# Patient Record
Sex: Female | Born: 1960 | Race: Black or African American | Hispanic: No | Marital: Single | Smoking: Former smoker
Health system: Southern US, Community
[De-identification: ages and names within clinical notes are randomized; demographics above are authoritative.]

## PROBLEM LIST (undated history)

## (undated) DIAGNOSIS — D649 Anemia, unspecified: Secondary | ICD-10-CM

## (undated) DIAGNOSIS — F32A Depression, unspecified: Secondary | ICD-10-CM

## (undated) DIAGNOSIS — I10 Essential (primary) hypertension: Secondary | ICD-10-CM

## (undated) DIAGNOSIS — F419 Anxiety disorder, unspecified: Secondary | ICD-10-CM

## (undated) DIAGNOSIS — E785 Hyperlipidemia, unspecified: Secondary | ICD-10-CM

## (undated) DIAGNOSIS — M1990 Unspecified osteoarthritis, unspecified site: Secondary | ICD-10-CM

## (undated) DIAGNOSIS — F329 Major depressive disorder, single episode, unspecified: Secondary | ICD-10-CM

## (undated) DIAGNOSIS — G4730 Sleep apnea, unspecified: Secondary | ICD-10-CM

## (undated) DIAGNOSIS — K219 Gastro-esophageal reflux disease without esophagitis: Secondary | ICD-10-CM

## (undated) HISTORY — DX: Anxiety disorder, unspecified: F41.9

## (undated) HISTORY — DX: Anemia, unspecified: D64.9

## (undated) HISTORY — DX: Major depressive disorder, single episode, unspecified: F32.9

## (undated) HISTORY — PX: OTHER SURGICAL HISTORY: SHX169

## (undated) HISTORY — DX: Unspecified osteoarthritis, unspecified site: M19.90

## (undated) HISTORY — PX: CARPAL TUNNEL RELEASE: SHX101

## (undated) HISTORY — DX: Depression, unspecified: F32.A

## (undated) HISTORY — DX: Gastro-esophageal reflux disease without esophagitis: K21.9

## (undated) HISTORY — DX: Hyperlipidemia, unspecified: E78.5

## (undated) HISTORY — DX: Essential (primary) hypertension: I10

---

## 1988-06-29 HISTORY — PX: OTHER SURGICAL HISTORY: SHX169

## 2004-03-04 ENCOUNTER — Ambulatory Visit (HOSPITAL_COMMUNITY): Admission: RE | Admit: 2004-03-04 | Discharge: 2004-03-04 | Payer: Self-pay | Admitting: Orthopaedic Surgery

## 2009-05-09 ENCOUNTER — Emergency Department (HOSPITAL_COMMUNITY): Admission: EM | Admit: 2009-05-09 | Discharge: 2009-05-09 | Payer: Self-pay | Admitting: Emergency Medicine

## 2009-08-03 ENCOUNTER — Encounter: Payer: Self-pay | Admitting: Family Medicine

## 2009-08-03 DIAGNOSIS — F429 Obsessive-compulsive disorder, unspecified: Secondary | ICD-10-CM | POA: Insufficient documentation

## 2009-08-03 DIAGNOSIS — F319 Bipolar disorder, unspecified: Secondary | ICD-10-CM | POA: Insufficient documentation

## 2009-08-03 DIAGNOSIS — I10 Essential (primary) hypertension: Secondary | ICD-10-CM | POA: Insufficient documentation

## 2009-08-03 DIAGNOSIS — E7800 Pure hypercholesterolemia, unspecified: Secondary | ICD-10-CM | POA: Insufficient documentation

## 2009-08-03 DIAGNOSIS — M25569 Pain in unspecified knee: Secondary | ICD-10-CM | POA: Insufficient documentation

## 2009-08-03 DIAGNOSIS — F3132 Bipolar disorder, current episode depressed, moderate: Secondary | ICD-10-CM | POA: Insufficient documentation

## 2009-08-05 ENCOUNTER — Ambulatory Visit: Payer: Self-pay | Admitting: Family Medicine

## 2010-02-12 ENCOUNTER — Emergency Department (HOSPITAL_COMMUNITY): Admission: EM | Admit: 2010-02-12 | Discharge: 2010-02-12 | Payer: Self-pay | Admitting: Emergency Medicine

## 2010-07-30 NOTE — Assessment & Plan Note (Signed)
Summary: np,df   Vital Signs:  Patient profile:   50 year old female Height:      60.5 inches Weight:      212.5 pounds BMI:     40.97 Temp:     98.6 degrees F oral Pulse rate:   92 / minute BP sitting:   158 / 89  (left arm) Cuff size:   large  Vitals Entered By: Gladstone Pih (August 05, 2009 11:11 AM) CC: Np get established/bilateral knee pain Is Patient Diabetic? No Pain Assessment Patient in pain? no        Primary Care Provider:  Magnus Ivan MD  CC:  Np get established/bilateral knee pain.  History of Present Illness: new patient:  patient new to [facility] and is getting established bilateral knee pain: Character: swollen & pain, keeps from sleeping, aching, intermittent, has gotten worse over time. Location:  mainly R knee yet pain has moved to L knee in an attempt to take weight off R knee  Onest  started dec 09 pain in back of leg while walking, knee hurting since that incident and 30 lbs  weight gain in last 6 months has caused pain to get worse. walking, cold weather makes symptoms worse ibuprofen helps for couple of hours takes maximum dose prn is 15 days out of the month, pt cannot take narcotics Radiates up thighs     Habits & Providers  Alcohol-Tobacco-Diet     Tobacco Status: current     Tobacco Counseling: to quit use of tobacco products     Cigarette Packs/Day: 0.25  Social History: Smoking Status:  current Packs/Day:  0.25  Physical Exam  General:  NAD, obese vital signs normal exept for high bp and O2 sat in lower 90's. Pulses:  R dorsalis pedis normal and L dorsalis pedis normal. 2+     Knee Exam  General:    pt has to hold R knee straight as she gets onto examination table  Gait:    pt walks with apprehension  Inspection:    no deformity bilaterally, no effusion bilaterally on knee  Motor:    lower extremity strength 5-/5 on R lower extremity strength 5/5 on L   Reflexes:    2+ patellar reflexes bilaterally  Knee  Exam:    Right:    Tenderness:  tenderness to palpation of joint line    Erythema:  no    Left:    Erythema:  no  Patellar mobility:    Right normal    Left normal Patellofemoral joint crepitus:    Right negative   Impression & Recommendations:  Problem # 1:  KNEE PAIN, BILATERAL (ICD-719.46) Assessment Deteriorated Patient is having chronic knee pain.  Patient had gone to the emergency department on 05/09/2009 and X rays showed no fracture, no subluxation, mild loss of joint space seen in the medial compartment and trace spurring in all three compartments and joint effusion.  Considering this patient to have arthritic changes based on x ray.  Advised patient to take naproxen (which lasts longer than ibuprofen) and alternate with tylenol.  will consider other means of therapy if knee continues to bother patient.  will follow up in a month.  will likely address elevated blood pressure and low oxygen saturations and more detailed knee exam on next visit if needed.  told patient about Jaynee Eagles today as she has no insurance.  Complete Medication List: 1)  Lamictal 25 Mg Tabs (Lamotrigine) .... Bid 2)  Prozac 20  Mg Caps (Fluoxetine hcl) .... Once a day 3)  Clonazepam 1 Mg Tabs (Clonazepam) .... Three times a day 4)  Lisinopril 10 Mg Tabs (Lisinopril) .... Once a day  Patient Instructions: 1)  Ms. Garrow, I'm sorry you are having chronic knee pain.  We are going to try some NAPROXEN 500 mg two times a day everyday to help your knee pain.  It lasts longer than ibuprofen.  You can also take tylenol to help with the pain 500mg  alternate with the naproxen when it wears off.Marland Kitchen 2)  Also, if you want to get some help losing weight, we can refer you to our nutritionist, Wyona Almas.  Also, if you want some exercise, you could try water aerobics at the [facility].  They give scholarships for that. 3)  Please go see Jaynee Eagles so that you can get some financial assistance because you also might be able to  get physical therapy. 4)  Follow up with me in a month so that we can reassess your pain. 5)  Thank you and be blessed!  Appended Document: Orders Update    Clinical Lists Changes  Orders: Added new Test order of [facility] (47829) - Signed

## 2010-07-30 NOTE — Miscellaneous (Signed)
Summary: new patient data  Clinical Lists Changes  Problems: Added new problem of ESSENTIAL HYPERTENSION, BENIGN (ICD-401.1) Added new problem of HYPERCHOLESTEROLEMIA (ICD-272.0) Added new problem of BIPOLAR DISORDER UNSPECIFIED (ICD-296.80) Added new problem of DEPRESSION (ICD-311) Added new problem of OBSESSIVE-COMPULSIVE DISORDER (ICD-300.3) Added new problem of KNEE PAIN (ZOX-096.04) Medications: Added new medication of LAMICTAL 25 MG TABS (LAMOTRIGINE) BID Added new medication of PROZAC 20 MG CAPS (FLUOXETINE HCL) once a day Added new medication of CLONAZEPAM 1 MG TABS (CLONAZEPAM) three times a day Added new medication of LISINOPRIL 10 MG TABS (LISINOPRIL) once a day Observations: Added new observation of FAMILY HX: father died at 69 mother died at 30 family hx of htn (08-26-2009 21:58) Added new observation of SOCIAL HX: lives with daugther and 2 grandchildren.  no pets or employment, never married.   smokes cigarettes no recreational drug use, alcohol use, exercise (August 26, 2009 21:58) Added new observation of PAST SURG HX: 2 c-sections and BTL ovary removal (1985) (08-26-2009 21:58) Added new observation of MEDRECON: current updated (08/26/2009 21:58)       Current Medications (verified): 1)  Lamictal 25 Mg Tabs (Lamotrigine) .... Bid 2)  Prozac 20 Mg Caps (Fluoxetine Hcl) .... Once A Day 3)  Clonazepam 1 Mg Tabs (Clonazepam) .... Three Times A Day 4)  Lisinopril 10 Mg Tabs (Lisinopril) .... Once A Day   Family History: father died at 24 mother died at 76 family hx of htn  Social History: lives with daugther and 2 grandchildren.  no pets or employment, never married.   smokes cigarettes no recreational drug use, alcohol use, exercise   Past History:  Past Surgical History: 2 c-sections and BTL ovary removal (1985)

## 2010-08-07 ENCOUNTER — Encounter: Payer: Self-pay | Admitting: *Deleted

## 2010-11-14 NOTE — Op Note (Signed)
NAME:  Rachel [name], Rachel [name]                      ACCOUNT NO.:  [id number]   MEDICAL RECORD NO.:  [id number]                   PATIENT TYPE:  AMB   LOCATION:  DAY                                  FACILITY:  [facility]   PHYSICIAN:  J. Darreld Mclean, M.D.              DATE OF BIRTH:  [birth year]   DATE OF PROCEDURE:  03/04/2004  DATE OF DISCHARGE:                                 OPERATIVE REPORT   PREOPERATIVE DIAGNOSIS:  Carpal tunnel syndrome, right.   POSTOPERATIVE DIAGNOSIS:  Carpal tunnel syndrome, right.   OPERATION PERFORMED:  Release of volar carpal ligament, saline neurolysis,  epineurotomy right median nerve.   SURGEON:  J. Darreld Mclean, M.D.   ASSISTANT:  Candace Cruise, P.A.   ANESTHESIA:  Bier block.  Please see anesthesia record for tourniquet time.   INDICATIONS FOR PROCEDURE:  The patient has had pain and tenderness in her  right hand.  She has positive nerve conduction velocity studies showing  carpal tunnel syndrome on the right.  She has not improved with conservative  treatment.  Risks and imponderables were discussed preoperatively.   DESCRIPTION OF PROCEDURE:  The patient was seen in the holding area and the  right hand was identified as the correct site.  The patient placed a marker  on it and so did I.  The patient was brought to the operating room, placed  supine on the operating table and Bier block anesthesia was given.  Again,  verified that this patient was having a right carpal tunnel and the right  side was the correct side.  The patient was prepped and draped in the usual  manner.  A __________ incision was made.  Careful dissection, median nerve  was identified proximally, vessel loop placed around the nerve.  Then using  a groove director, volar carpal ligament was then incised.  Specimen volar  carpal ligament was sent to pathology.  Retinaculum cut proximally.  Saline  neurolysis, epineurotomy carried out of the median nerve.  Wound was then  reapproximated using 3-0 nylon interrupted vertical mattress manner.  Sterile dressing applied.  Bulky dressing applied.  Sheet cotton applied.  Sheet cotton cut dorsally, volar plaster splint applied.  Ace bandage  applied loosely.  Patient given a prescription for Vicodin ES for pain.  Will see her in the office in approximately 10 days to two weeks.  Any  difficulty, to let me know.  Instructions given. Contact me through the  office and hospital beeper system for any difficulty.  Numbers provided.      ___________________________________________                                            Teola Bradley, M.D.   JWK/MEDQ  D:  03/04/2004  T:  03/04/2004  Job:  [id number]

## 2010-11-14 NOTE — H&P (Signed)
NAME:  Rachel [name], BAUMBACH                      ACCOUNT NO.:  [id number]   MEDICAL RECORD NO.:  [id number]                   PATIENT TYPE:  AMB   LOCATION:  DAY                                  FACILITY:  [facility]   PHYSICIAN:  J. Darreld Mclean, M.D.              DATE OF BIRTH:  April 24, 1961   DATE OF ADMISSION:  DATE OF DISCHARGE:                                HISTORY & PHYSICAL   CHIEF COMPLAINT:  Carpal tunnel syndrome, right.   HISTORY:  The patient is a 50 year old female with bilateral carpal tunnel  syndrome.  She was first seen in my office in 2003 complaining of pain and  tenderness in the median nerve distribution.  She was seen by Dr. Gerilyn Pilgrim  and had studies done which showed carpal tunnel syndrome on the right and  early changes on the left.  He did not want to have any surgery done at that  time.  We treated her conservatively. She has had splints.  She has had anti-  inflammatories.  She is having increasing pain and increasing tenderness.  I  saw her in my office recently for lateral epicondylitis in the right elbow  and that has resolved.  She is still having significant pain and tenderness  in both median nerve distributions from both hands and dropping things.  She  would like to go ahead and have the procedure now.  She has not improved  with conservative treatment.   CURRENT MEDICATIONS:  1.  Vicodin 5/500.  2.  Mobic 15 mg daily.   ALLERGIES:  The patient is allergic to PENICILLIN.   PAST HISTORY:  Past history is positive for C-sections in 1987 and 1990.   REVIEW OF SYSTEMS:  Negative review of systems otherwise.   SOCIAL HISTORY:  She smokes and does not use alcoholic beverages.  The  patient is single and lives in [place].   PHYSICAL EXAMINATION:  VITAL SIGNS:  The patient's vital signs are within  normal limits.  HEENT:  Negative.  NECK:  Supple.  LUNGS:  Clear to P&A.  HEART:  Regular rhythm.  No murmurs heard.  ABDOMEN:  Soft and nontender without  masses.  EXTREMITIES:  Decreased sensation in both __________ nerve distributions,  right greater than left.  Positive Phalen's on the right, negative on the  left.  Grip strengths are good.  Other extremities within normal limits.  CENTRAL NERVOUS SYSTEM:  Intact.  SKIN:  Intact.   IMPRESSION:  Carpal tunnel syndrome on the right.   PLAN:  Release volar carpal ligament.  I discussed with the patient the plan  and procedure, risks and imponderables.  Labs are pending.     ___________________________________________                                         Teola Bradley,  M.D.   Eliane Decree  D:  02/28/2004  T:  02/29/2004  Job:  [id number]

## 2012-09-06 ENCOUNTER — Other Ambulatory Visit (HOSPITAL_COMMUNITY): Payer: Self-pay | Admitting: Family Medicine

## 2012-09-06 ENCOUNTER — Ambulatory Visit (HOSPITAL_COMMUNITY)
Admission: RE | Admit: 2012-09-06 | Discharge: 2012-09-06 | Disposition: A | Discharge status: Home / Self Care | Payer: BC Managed Care – PPO | Source: Ambulatory Visit | Attending: Family Medicine | Admitting: Family Medicine

## 2012-09-06 DIAGNOSIS — Z01419 Encounter for gynecological examination (general) (routine) without abnormal findings: Secondary | ICD-10-CM

## 2012-09-06 DIAGNOSIS — I10 Essential (primary) hypertension: Secondary | ICD-10-CM | POA: Insufficient documentation

## 2012-09-12 ENCOUNTER — Ambulatory Visit (HOSPITAL_COMMUNITY): Payer: Self-pay

## 2012-09-22 ENCOUNTER — Telehealth: Payer: Self-pay

## 2012-09-22 NOTE — Telephone Encounter (Signed)
LMOM to call.

## 2012-09-26 NOTE — Telephone Encounter (Signed)
Called, could not leave a message on mobile. Someone at work said she will be back at work tomorrow.

## 2012-09-27 NOTE — Telephone Encounter (Signed)
Letter to pt and to PCP.  

## 2014-03-19 ENCOUNTER — Other Ambulatory Visit (HOSPITAL_COMMUNITY): Payer: Self-pay | Admitting: Internal Medicine

## 2014-03-19 DIAGNOSIS — Z139 Encounter for screening, unspecified: Secondary | ICD-10-CM

## 2014-03-22 ENCOUNTER — Ambulatory Visit (HOSPITAL_COMMUNITY)
Admission: RE | Admit: 2014-03-22 | Discharge: 2014-03-22 | Disposition: A | Discharge status: Home / Self Care | Payer: BC Managed Care – PPO | Source: Ambulatory Visit | Attending: Internal Medicine | Admitting: Internal Medicine

## 2014-03-22 DIAGNOSIS — Z1231 Encounter for screening mammogram for malignant neoplasm of breast: Secondary | ICD-10-CM | POA: Insufficient documentation

## 2014-03-22 DIAGNOSIS — Z139 Encounter for screening, unspecified: Secondary | ICD-10-CM

## 2014-04-12 ENCOUNTER — Ambulatory Visit (INDEPENDENT_AMBULATORY_CARE_PROVIDER_SITE_OTHER): Payer: BC Managed Care – PPO | Admitting: Neurology

## 2014-04-12 ENCOUNTER — Encounter: Payer: Self-pay | Admitting: Neurology

## 2014-04-12 VITALS — BP 154/96 | HR 91 | Temp 97.9°F | Resp 14 | Ht 62.75 in | Wt 190.2 lb

## 2014-04-12 DIAGNOSIS — G4733 Obstructive sleep apnea (adult) (pediatric): Secondary | ICD-10-CM

## 2014-04-12 DIAGNOSIS — R51 Headache: Secondary | ICD-10-CM

## 2014-04-12 DIAGNOSIS — G4734 Idiopathic sleep related nonobstructive alveolar hypoventilation: Secondary | ICD-10-CM

## 2014-04-12 DIAGNOSIS — F17200 Nicotine dependence, unspecified, uncomplicated: Secondary | ICD-10-CM

## 2014-04-12 DIAGNOSIS — Z720 Tobacco use: Secondary | ICD-10-CM

## 2014-04-12 DIAGNOSIS — R519 Headache, unspecified: Secondary | ICD-10-CM

## 2014-04-12 DIAGNOSIS — R351 Nocturia: Secondary | ICD-10-CM

## 2014-04-12 NOTE — Progress Notes (Signed)
Subjective:    Patient ID: Rachel [name] is a 53 y.o. female.  HPI    Star Age, MD, PhD [facility] [address] [address] [place], [place] [address]  Dear Dr. Gerarda Fraction,   I saw your patient, Rachel [name], upon your kind request in my neurologic clinic today for initial consultation of her sleep disorder, concern for underlying obstructive sleep apnea in the context of her recent abnormal overnight pulse oximetry test. The patient is unaccompanied today. As you know, [name] is a 53 year old right-handed woman with an underlying medical history of hypertension, obesity, and depression, who reports snoring and significant daytime somnolence. She had overnight pulse oximetry test on 03/21/2014 which I reviewed: Her average oxygen saturation was 91%, lowest oxygen saturation was 79%, time below 88% saturation was 59 minutes. She has since been placed on oxygen at night, 2 lpm via [place], however, she has had trouble keeping it on. Her sleepiness is severe with an ESS of 18/24. 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 Currently Her bedtime is 10 PM and her wake time is 6 AM. She has frequent morning HAs, she has nocturia x 1 typically.  She has been known to snore for the past many years. Snoring is reportedly marked, and associated with choking sounds and witnessed apneas. The patient admits to a sense of choking or strangling feeling. There is no report of nighttime reflux, with frequent nighttime cough experienced. The patient has not noted any RLS symptoms and is not known to kick while asleep or before falling asleep. There is family history of OSA in her younger brother.  She is a restless sleeper and in the morning, the bed is quite disheveled.   She denies cataplexy, sleep paralysis, hypnagogic or hypnopompic hallucinations, or sleep attacks. She does not report any vivid dreams, nightmares, dream enactments, or parasomnias, such as sleep talking or sleep  walking. The patient has not had a sleep study or a home sleep test.  She consumes about 2 cups of coffee per day. She rarely drinks alcohol. She smokes one pack per day approximately.  Her Past Medical History Is Significant For: Past Medical History  Diagnosis Date  . Hypertension   . Depression   . Anxiety     Her Past Surgical History Is Significant For: Past Surgical History  Procedure Laterality Date  . Cesarian  1990    Her Family History Is Significant For: History reviewed. No pertinent family history.  Her Social History Is Significant For: History   Social History  . Marital Status: Single    Spouse Name: N/A    Number of Children: N/A  . Years of Education: N/A   Social History Main Topics  . Smoking status: Current Every Day Smoker -- 1.00 packs/day for 25 years    Types: Cigarettes  . Smokeless tobacco: None  . Alcohol Use: Yes     Comment: occ  . Drug Use: Yes     Comment: marijuana on occ  . Sexual Activity: None   Other Topics Concern  . None   Social History Narrative   Right handed. Single.  3 kids.  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  Caffeine 3 16 oz coffee, 4 sodas    Her Allergies Are:  Allergies  Allergen Reactions  . Penicillins Itching  :   Her Current Medications Are:  Outpatient Encounter Prescriptions as of 04/12/2014  Medication Sig  . clonazePAM (KLONOPIN) 1 MG tablet Take 1 mg  by mouth 3 (three) times daily.    Marland Kitchen FLUoxetine (PROZAC) 20 MG capsule Take 20 mg by mouth daily.    Marland Kitchen lisinopril (PRINIVIL,ZESTRIL) 10 MG tablet Take 10 mg by mouth daily.    . [DISCONTINUED] lamoTRIgine (LAMICTAL) 25 MG tablet Take 25 mg by mouth 2 (two) times daily.    :  Review of Systems:  Out of a complete 14 point review of systems, all are reviewed and negative with the exception of these symptoms as listed below:  Review of Systems  Constitutional:       Weight gain, fatigue  Eyes: Positive for visual disturbance.  Respiratory: Positive for  cough, shortness of breath and wheezing.        Snoring  Endocrine: Positive for cold intolerance and heat intolerance.       Increased thirst, flushing  Musculoskeletal:       Cramps  Skin:       moles  Neurological: Positive for headaches.       Memory loss, weakness, sleepiness  Psychiatric/Behavioral:       Depression, anxiety, not enough sleep, decreased energy, disinterest in activities, racing thoughts    Objective:  Neurologic Exam  Physical Exam Physical Examination:   Filed Vitals:   04/12/14 0847  BP: 154/96  Pulse: 91  Temp: 97.9 F (36.6 C)  Resp: 14    General Examination: The patient is a very pleasant 53 y.o. female in no acute distress. She appears well-developed and well-nourished and well groomed. She is obese.   HEENT: Normocephalic, atraumatic, pupils are equal, round and reactive to light and accommodation. Funduscopic exam is normal with sharp disc margins noted. Extraocular tracking is good without limitation to gaze excursion or nystagmus noted. Normal smooth pursuit is noted. Hearing is grossly intact. Tympanic membranes are clear bilaterally. Face is symmetric with normal facial animation and normal facial sensation. Speech is clear with no dysarthria noted. There is no hypophonia. There is no lip, neck/head, jaw or voice tremor. Neck is supple with full range of passive and active motion. There are no carotid bruits on auscultation. Oropharynx exam reveals: moderate mouth dryness, adequate dental hygiene and moderate airway crowding, due to redundant soft palate, elongated tongue, elongated uvula and tonsils in place. Mallampati is class II. Tongue protrudes centrally and palate elevates symmetrically. Tonsils are 1+ in size. Neck size is 15.5 inches.  Chest: Clear to auscultation without wheezing, rhonchi or crackles noted.  Heart: S1+S2+0, regular and normal without murmurs, rubs or gallops noted.   Abdomen: Soft, non-tender and non-distended with  normal bowel sounds appreciated on auscultation.  Extremities: There is no pitting edema in the distal lower extremities bilaterally. Pedal pulses are intact.  Skin: Warm and dry without trophic changes noted. There are no varicose veins.  Musculoskeletal: exam reveals no obvious joint deformities, tenderness or joint swelling or erythema.   Neurologically:  Mental status: The patient is awake, alert and oriented in all 4 spheres. Her immediate and remote memory, attention, language skills and fund of knowledge are appropriate. There is no evidence of aphasia, agnosia, apraxia or anomia. Speech is clear with normal prosody and enunciation. Thought process is linear. Mood is normal and affect is normal.  Cranial nerves II - XII are as described above under HEENT exam. In addition: shoulder shrug is normal with equal shoulder height noted. Motor exam: Normal bulk, strength and tone is noted. There is no drift, tremor or rebound. Romberg is negative. Reflexes are 2+ throughout. Babinski: Toes  are flexor bilaterally. Fine motor skills and coordination: intact with normal finger taps, normal hand movements, normal rapid alternating patting, normal foot taps and normal foot agility.  Cerebellar testing: No dysmetria or intention tremor on finger to nose testing. Heel to shin is unremarkable bilaterally. There is no truncal or gait ataxia.  Sensory exam: intact to light touch, pinprick, vibration, temperature sense in the upper and lower extremities.  Gait, station and balance: She stands easily. No veering to one side is noted. No leaning to one side is noted. Posture is age-appropriate and stance is narrow based. Gait shows normal stride length and normal pace. No problems turning are noted. She turns en bloc. Tandem walk is unremarkable.         Assessment and Plan:  In summary, DRESDEN AMENT is a very pleasant 53 y.o.-year old female with an underlying medical history of hypertension, obesity, and  depression, who reports snoring and significant daytime somnolence. She had overnight pulse oximetry test on 03/21/2014 which was abnormal. This is in the context of COPD and smoking. She has been placed on oxygen at night since then. Her history and physical exam are consistent with obstructive sleep apnea (OSA). I had a long chat with the patient about my findings and the diagnosis of OSA, its prognosis and treatment options. We talked about medical treatments, surgical interventions and non-pharmacological approaches. I explained in particular the risks and ramifications of untreated moderate to severe OSA, especially with respect to developing cardiovascular disease down the Road, including congestive heart failure, difficult to treat hypertension, cardiac arrhythmias, or stroke. Even type 2 diabetes has, in part, been linked to untreated OSA. Symptoms of untreated OSA include daytime sleepiness, memory problems, mood irritability and mood disorder such as depression and anxiety, lack of energy, as well as recurrent headaches, especially morning headaches. We talked about smoking cessation and trying to maintain a healthy lifestyle in general, as well as the importance of weight control. I encouraged the patient to eat healthy, exercise daily and keep well hydrated, to keep a scheduled bedtime and wake time routine, to not skip any meals and eat healthy snacks in between meals. I advised the patient not to drive when feeling sleepy. I recommended the following at this time: sleep study with potential positive airway pressure titration. (We will score hypopneas at 3% and split the sleep study into diagnostic and treatment portion, if the estimated. 2 hour AHI is >15/h).  If she does well with CPAP therapy she may not need supplemental oxygen at night.  I explained the sleep test procedure to the patient and also outlined possible surgical and non-surgical treatment options of OSA, including the use of a  custom-made dental device (which would require a referral to a specialist dentist or oral surgeon), upper airway surgical options, such as pillar implants, radiofrequency surgery, tongue base surgery, and UPPP (which would involve a referral to an ENT surgeon). Rarely, jaw surgery such as mandibular advancement may be considered.  I also explained the CPAP treatment option to the patient, who indicated that she would be willing to try CPAP if the need arises. I explained the importance of being compliant with PAP treatment, not only for insurance purposes but primarily to improve Her symptoms, and for the patient's long term health benefit, including to reduce Her cardiovascular risks. I answered all her questions today and the patient was in agreement. I would like to see her back after the sleep study is completed and encouraged her  to call with any interim questions, concerns, problems or updates.   Thank you very much for allowing me to participate in the care of this nice patient. If I can be of any further assistance to you please do not hesitate to call me at [phone].  Sincerely,   Star Age, MD, PhD

## 2014-04-12 NOTE — Patient Instructions (Signed)

## 2014-10-10 ENCOUNTER — Encounter (INDEPENDENT_AMBULATORY_CARE_PROVIDER_SITE_OTHER): Payer: Self-pay | Admitting: *Deleted

## 2015-10-11 DIAGNOSIS — G4733 Obstructive sleep apnea (adult) (pediatric): Secondary | ICD-10-CM | POA: Diagnosis not present

## 2015-10-11 DIAGNOSIS — Z719 Counseling, unspecified: Secondary | ICD-10-CM | POA: Diagnosis not present

## 2016-03-05 DIAGNOSIS — J449 Chronic obstructive pulmonary disease, unspecified: Secondary | ICD-10-CM | POA: Diagnosis not present

## 2016-03-05 DIAGNOSIS — J209 Acute bronchitis, unspecified: Secondary | ICD-10-CM | POA: Diagnosis not present

## 2016-03-05 DIAGNOSIS — R070 Pain in throat: Secondary | ICD-10-CM | POA: Diagnosis not present

## 2016-03-05 DIAGNOSIS — Z6841 Body Mass Index (BMI) 40.0 and over, adult: Secondary | ICD-10-CM | POA: Diagnosis not present

## 2016-03-05 DIAGNOSIS — Z1389 Encounter for screening for other disorder: Secondary | ICD-10-CM | POA: Diagnosis not present

## 2016-04-30 DIAGNOSIS — Z1389 Encounter for screening for other disorder: Secondary | ICD-10-CM | POA: Diagnosis not present

## 2016-04-30 DIAGNOSIS — Z6841 Body Mass Index (BMI) 40.0 and over, adult: Secondary | ICD-10-CM | POA: Diagnosis not present

## 2016-04-30 DIAGNOSIS — J449 Chronic obstructive pulmonary disease, unspecified: Secondary | ICD-10-CM | POA: Diagnosis not present

## 2016-04-30 DIAGNOSIS — J209 Acute bronchitis, unspecified: Secondary | ICD-10-CM | POA: Diagnosis not present

## 2016-05-06 ENCOUNTER — Other Ambulatory Visit (HOSPITAL_COMMUNITY): Payer: Self-pay | Admitting: Internal Medicine

## 2016-05-06 ENCOUNTER — Ambulatory Visit (HOSPITAL_COMMUNITY)
Admission: RE | Admit: 2016-05-06 | Discharge: 2016-05-06 | Disposition: A | Discharge status: Home / Self Care | Payer: BLUE CROSS/BLUE SHIELD | Source: Ambulatory Visit | Attending: Internal Medicine | Admitting: Internal Medicine

## 2016-05-06 DIAGNOSIS — R05 Cough: Secondary | ICD-10-CM

## 2016-05-06 DIAGNOSIS — R059 Cough, unspecified: Secondary | ICD-10-CM

## 2016-05-06 DIAGNOSIS — I10 Essential (primary) hypertension: Secondary | ICD-10-CM | POA: Diagnosis not present

## 2016-05-06 DIAGNOSIS — R0600 Dyspnea, unspecified: Secondary | ICD-10-CM | POA: Diagnosis not present

## 2016-05-06 DIAGNOSIS — Z6839 Body mass index (BMI) 39.0-39.9, adult: Secondary | ICD-10-CM | POA: Diagnosis not present

## 2016-05-06 DIAGNOSIS — J9801 Acute bronchospasm: Secondary | ICD-10-CM | POA: Diagnosis not present

## 2016-05-06 DIAGNOSIS — Z1389 Encounter for screening for other disorder: Secondary | ICD-10-CM | POA: Diagnosis not present

## 2016-05-06 DIAGNOSIS — J209 Acute bronchitis, unspecified: Secondary | ICD-10-CM | POA: Diagnosis not present

## 2016-06-11 ENCOUNTER — Telehealth: Payer: Self-pay

## 2016-06-11 NOTE — Telephone Encounter (Signed)
Pt left Vm that she is ready to schedule colonoscopy. I returned her call and LMOM for a return call.

## 2016-06-17 DIAGNOSIS — Z6841 Body Mass Index (BMI) 40.0 and over, adult: Secondary | ICD-10-CM | POA: Diagnosis not present

## 2016-06-17 DIAGNOSIS — J209 Acute bronchitis, unspecified: Secondary | ICD-10-CM | POA: Diagnosis not present

## 2016-06-17 DIAGNOSIS — J069 Acute upper respiratory infection, unspecified: Secondary | ICD-10-CM | POA: Diagnosis not present

## 2016-06-17 DIAGNOSIS — Z1389 Encounter for screening for other disorder: Secondary | ICD-10-CM | POA: Diagnosis not present

## 2016-06-17 DIAGNOSIS — J343 Hypertrophy of nasal turbinates: Secondary | ICD-10-CM | POA: Diagnosis not present

## 2016-06-17 DIAGNOSIS — R070 Pain in throat: Secondary | ICD-10-CM | POA: Diagnosis not present

## 2016-06-25 NOTE — Telephone Encounter (Signed)
LMOM to call.

## 2016-07-13 ENCOUNTER — Telehealth: Payer: Self-pay

## 2016-07-13 NOTE — Telephone Encounter (Signed)
See separate triage.  

## 2016-07-13 NOTE — Telephone Encounter (Signed)
Triaged today.  

## 2016-07-21 NOTE — Telephone Encounter (Signed)
MOVI PREP SPLIT DOSING, REGULAR BREAKFAST. CLEAR LIQUIDS AFTER 9 AM.  

## 2016-07-21 NOTE — Telephone Encounter (Signed)
Gastroenterology Pre-Procedure Review  Request Date: 07/13/2016 Requesting Physician: Rowan Blase, PA/Dr. Hilma Favors  PATIENT REVIEW QUESTIONS: The patient responded to the following health history questions as indicated:    1. Diabetes Melitis: no 2. Joint replacements in the past 12 months: no 3. Major health problems in the past 3 months: no 4. Has an artificial valve or MVP: no 5. Has a defibrillator: no 6. Has been advised in past to take antibiotics in advance of a procedure like teeth cleaning: no 7. Family history of colon cancer: no  8. Alcohol Use: Occasionally for special occasions 9. History of sleep apnea: no  10. History of coronary artery or other vascular stents placed within the last 12 months: no    MEDICATIONS & ALLERGIES:    Patient reports the following regarding taking any blood thinners:   Plavix? no Aspirin? no Coumadin? no Brilinta? no Xarelto? no Eliquis? no Pradaxa? no Savaysa? no Effient? no  Patient confirms/reports the following medications:  Current Outpatient Prescriptions  Medication Sig Dispense Refill  . clonazePAM (KLONOPIN) 1 MG tablet Take 1 mg by mouth 2 (two) times daily as needed.     Marland Kitchen FLUoxetine (PROZAC) 20 MG capsule Take 20 mg by mouth daily.      Marland Kitchen lisinopril-hydrochlorothiazide (PRINZIDE,ZESTORETIC) 20-25 MG tablet Take 1 tablet by mouth daily.     No current facility-administered medications for this visit.     Patient confirms/reports the following allergies:  Allergies  Allergen Reactions  . Penicillins Itching    No orders of the defined types were placed in this encounter.   AUTHORIZATION INFORMATION Primary Insurance:  ID #:  Group #:  Pre-Cert / Auth required:  Pre-Cert / Auth #:   Secondary Insurance:   ID #:   Group #:  Pre-Cert / Auth required:  Pre-Cert / Auth #:   SCHEDULE INFORMATION: Procedure has been scheduled as follows:  Date:                 Time:   Location:   This Gastroenterology Pre-Precedure  Review Form is being routed to the following provider(s): Barney Drain, MD

## 2016-07-22 ENCOUNTER — Other Ambulatory Visit: Payer: Self-pay

## 2016-07-22 DIAGNOSIS — Z1211 Encounter for screening for malignant neoplasm of colon: Secondary | ICD-10-CM

## 2016-07-22 NOTE — Telephone Encounter (Signed)
Pt has been scheduled for 08/10/2016 at 10:30 Am with Dr. Oneida Alar. Rx sent to the pharmacy and instructions mailed to pt.

## 2016-08-08 ENCOUNTER — Telehealth: Payer: Self-pay | Admitting: Gastroenterology

## 2016-08-08 NOTE — Telephone Encounter (Signed)
PT CALLED [facility]. [facility] DID NOT RECEIVE MOV[facility]REP. I CALLED [facility]. CONFIRMED RX FROM MOV[facility]REP NOT RECEIVED. ASKED WHY PHARMACY DID NOT CONTACT MD ON CALL. SHE SAID BECAUSE WE ARE CLOSED. EXPLAINED SOMEONE IS ON CALL FOR [place] 24 HR S A DAY. VERBAL ORDER GIVEN FOR MOVIPREP. LORE WILL CALL PT AND LET HER KNOW RX SENT. Called patient TO LET HER KNOW PREP IS AVAILABLE.

## 2016-08-10 ENCOUNTER — Encounter (HOSPITAL_COMMUNITY)
Admission: RE | Disposition: A | Discharge status: Home / Self Care | Payer: Self-pay | Source: Ambulatory Visit | Attending: Gastroenterology

## 2016-08-10 ENCOUNTER — Ambulatory Visit (HOSPITAL_COMMUNITY)
Admission: RE | Admit: 2016-08-10 | Discharge: 2016-08-10 | Disposition: A | Discharge status: Home / Self Care | Payer: BLUE CROSS/BLUE SHIELD | Source: Ambulatory Visit | Attending: Gastroenterology | Admitting: Gastroenterology

## 2016-08-10 ENCOUNTER — Encounter (HOSPITAL_COMMUNITY): Payer: Self-pay

## 2016-08-10 DIAGNOSIS — F17210 Nicotine dependence, cigarettes, uncomplicated: Secondary | ICD-10-CM | POA: Insufficient documentation

## 2016-08-10 DIAGNOSIS — D123 Benign neoplasm of transverse colon: Secondary | ICD-10-CM | POA: Diagnosis not present

## 2016-08-10 DIAGNOSIS — F419 Anxiety disorder, unspecified: Secondary | ICD-10-CM | POA: Insufficient documentation

## 2016-08-10 DIAGNOSIS — Z880 Allergy status to penicillin: Secondary | ICD-10-CM | POA: Insufficient documentation

## 2016-08-10 DIAGNOSIS — Z1211 Encounter for screening for malignant neoplasm of colon: Secondary | ICD-10-CM | POA: Diagnosis not present

## 2016-08-10 DIAGNOSIS — Z791 Long term (current) use of non-steroidal anti-inflammatories (NSAID): Secondary | ICD-10-CM | POA: Insufficient documentation

## 2016-08-10 DIAGNOSIS — D125 Benign neoplasm of sigmoid colon: Secondary | ICD-10-CM | POA: Diagnosis not present

## 2016-08-10 DIAGNOSIS — K635 Polyp of colon: Secondary | ICD-10-CM | POA: Diagnosis not present

## 2016-08-10 DIAGNOSIS — Z79899 Other long term (current) drug therapy: Secondary | ICD-10-CM | POA: Diagnosis not present

## 2016-08-10 DIAGNOSIS — Z1212 Encounter for screening for malignant neoplasm of rectum: Secondary | ICD-10-CM | POA: Diagnosis not present

## 2016-08-10 DIAGNOSIS — D124 Benign neoplasm of descending colon: Secondary | ICD-10-CM | POA: Diagnosis not present

## 2016-08-10 DIAGNOSIS — F329 Major depressive disorder, single episode, unspecified: Secondary | ICD-10-CM | POA: Insufficient documentation

## 2016-08-10 DIAGNOSIS — I10 Essential (primary) hypertension: Secondary | ICD-10-CM | POA: Insufficient documentation

## 2016-08-10 HISTORY — PX: COLONOSCOPY: SHX5424

## 2016-08-10 SURGERY — COLONOSCOPY
Anesthesia: Moderate Sedation

## 2016-08-10 MED ORDER — MIDAZOLAM HCL 5 MG/5ML IJ SOLN
INTRAMUSCULAR | Status: DC | PRN
  Administered 2016-08-10 (×2): 2 mg via INTRAVENOUS
  Administered 2016-08-10: 1 mg via INTRAVENOUS

## 2016-08-10 MED ORDER — PROMETHAZINE HCL 25 MG/ML IJ SOLN
INTRAMUSCULAR | Status: DC | PRN
  Administered 2016-08-10 (×2): 12.5 mg via INTRAVENOUS

## 2016-08-10 MED ORDER — MEPERIDINE HCL 100 MG/ML IJ SOLN
INTRAMUSCULAR | Status: DC | PRN
Start: 2016-08-10 — End: 2016-08-10
  Administered 2016-08-10: 50 mg
  Administered 2016-08-10: 25 mg

## 2016-08-10 MED ORDER — MIDAZOLAM HCL 5 MG/5ML IJ SOLN
INTRAMUSCULAR | Status: AC
  Filled 2016-08-10: qty 10

## 2016-08-10 MED ORDER — SODIUM CHLORIDE 0.9% FLUSH
INTRAVENOUS | Status: AC
  Filled 2016-08-10: qty 10

## 2016-08-10 MED ORDER — SODIUM CHLORIDE 0.9 % IV SOLN
INTRAVENOUS | Status: DC
  Administered 2016-08-10: 10:00:00 via INTRAVENOUS

## 2016-08-10 MED ORDER — MEPERIDINE HCL 100 MG/ML IJ SOLN
INTRAMUSCULAR | Status: AC
  Filled 2016-08-10: qty 2

## 2016-08-10 MED ORDER — PROMETHAZINE HCL 25 MG/ML IJ SOLN
INTRAMUSCULAR | Status: AC
  Filled 2016-08-10: qty 1

## 2016-08-10 MED ORDER — STERILE WATER FOR IRRIGATION IR SOLN
Status: DC | PRN
  Administered 2016-08-10: 100 mL

## 2016-08-10 NOTE — Op Note (Signed)
[facility] Patient Name: Rachel [name] Procedure Date: 08/10/2016 8:49 AM MRN: [id number] Date of Birth: 1960/09/18 Attending MD: Barney Drain , MD CSN: [id number] Age: 56 Admit Type: Outpatient Procedure:                Colonoscopy with COLD FORCEPS/SNARE POLYPECTOMY Indications:              Screening for colorectal malignant neoplasm Providers:                Barney Drain, MD, Rosina Lowenstein, RN, Isabella Stalling,                            Technician Referring MD:             Jake Samples PA Medicines:                Promethazine 25 mg IV, Meperidine 75 mg IV,                            Midazolam 5 mg IV Complications:            No immediate complications. Estimated Blood Loss:     Estimated blood loss was minimal. Procedure:                Pre-Anesthesia Assessment:                           - Prior to the procedure, a History and Physical                            was performed, and patient medications and                            allergies were reviewed. The patient's tolerance of                            previous anesthesia was also reviewed. The risks                            and benefits of the procedure and the sedation                            options and risks were discussed with the patient.                            All questions were answered, and informed consent                            was obtained. Prior Anticoagulants: The patient has                            taken ibuprofen. ASA Grade Assessment: II - A                            patient with mild systemic disease. After reviewing  the risks and benefits, the patient was deemed in                            satisfactory condition to undergo the procedure.                            After obtaining informed consent, the colonoscope                            was passed under direct vision. Throughout the                            procedure, the patient's blood  pressure, pulse, and                            oxygen saturations were monitored continuously. The                            Colonoscope was introduced through the anus and                            advanced to the the cecum, identified by                            appendiceal orifice and ileocecal valve. The                            colonoscopy was technically difficult and complex                            due to a tortuous colon. Successful completion of                            the procedure was aided by using manual pressure                            and COLOWRAP. The patient tolerated the procedure                            fairly well. The ileocecal valve, appendiceal                            orifice, and rectum were photographed. The quality                            of the bowel preparation was good. Scope In: 11:07:27 AM Scope Out: 11:39:31 AM Scope Withdrawal Time: 0 hours 27 minutes 6 seconds  Total Procedure Duration: 0 hours 32 minutes 4 seconds  Findings:      Two sessile polyps were found in the proximal transverse colon. The       polyps were 4 to 6 mm in size. These polyps were removed with a hot       snare. Resection and retrieval were complete.      12 sessile  polyps were found in the sigmoid colon, descending colon,       splenic flexure, mid transverse colon and distal transverse colon. The       polyps were 2 to 5 mm in size. These polyps were removed with a cold       biopsy forceps. Resection and retrieval were complete.      External and internal hemorrhoids were found during retroflexion. Impression:               - TWO polyps in the proximal transverse                            colon(?SIMPLE ADENOMAS), removed with a hot snare.                            Resected and retrieved.                           - 12 HYPERPLASTIC APPEARING polyps in the sigmoid                            colon(4), in the descending colon(2), at the                             splenic flexure(2), in the mid transverse colon(2)                            and in the distal transverse colon(2), removed with                            a cold biopsy forceps. Resected and retrieved.                           - External and internal hemorrhoids. Moderate Sedation:      Moderate (conscious) sedation was administered by the endoscopy nurse       and supervised by the endoscopist. The following parameters were       monitored: oxygen saturation, heart rate, blood pressure, and response       to care. Total physician intraservice time was 49 minutes. Recommendation:           - High fiber diet.                           - Continue present medications.                           - Await pathology results.                           - Repeat colonoscopy in 5-10 years for surveillance.                           - Patient has a contact number available for                            emergencies. The signs and symptoms of potential  delayed complications were discussed with the                            patient. Return to normal activities tomorrow.                            Written discharge instructions were provided to the                            patient. Procedure Code(s):        --- Professional ---                           [phone], Colonoscopy, flexible; with removal of                            tumor(s), polyp(s), or other lesion(s) by snare                            technique                           45380, 59, Colonoscopy, flexible; with biopsy,                            single or multiple                           99152, Moderate sedation services provided by the                            same physician or other qualified health care                            professional performing the diagnostic or                            therapeutic service that the sedation supports,                            requiring the presence of  an independent trained                            observer to assist in the monitoring of the                            patient's level of consciousness and physiological                            status; initial 15 minutes of intraservice time,                            patient age 62 years or older                           99153, Moderate sedation  services; each additional                            15 minutes intraservice time                           99153, Moderate sedation services; each additional                            15 minutes intraservice time Diagnosis Code(s):        --- Professional ---                           Z12.11, Encounter for screening for malignant                            neoplasm of colon                           D12.5, Benign neoplasm of sigmoid colon                           D12.4, Benign neoplasm of descending colon                           D12.3, Benign neoplasm of transverse colon (hepatic                            flexure or splenic flexure)                           K64.8, Other hemorrhoids CPT copyright 2016 American Medical Association. All rights reserved. The codes documented in this report are preliminary and upon coder review may  be revised to meet current compliance requirements. Barney Drain, MD Barney Drain, MD 08/10/2016 11:56:47 AM This report has been signed electronically. Number of Addenda: 0

## 2016-08-10 NOTE — Discharge Instructions (Signed)
You had 14 SMALL polyps removed. You have internal AND EXTERNAL hemorrhoids.   CONTINUE YOUR WEIGHT LOSS EFFORTS. LOSE TEN POUNDS.  WHILE I DO NOT WANT TO ALARM YOU, YOUR BODY MASS INDEX IS ALMOST 40, WHICH MEANS YOU ARE SEVERELY OBESE. OBESITY STIMULATES CANCER GENES. OBESITY IS ASSOCIATED WITH AN INCREASE RISK FOR ALL CANCERS, INCLUDING ESOPHAGEAL AND COLON CANCER.  YOU SHOULD STOP SMOKING. IT INCREASES YOUR RISK FOR COLON AND ESOPHAGEAL CANCER.  DRINK WATER TO KEEP YOUR URINE LIGHT YELLOW.  FOLLOW A HIGH FIBER DIET. AVOID ITEMS THAT CAUSE BLOATING & GAS. SEE INFO BELOW.  YOUR BIOPSY RESULTS WILL BE AVAILABLE IN MY CHART AFTER FEB 15 AND MY OFFICE WILL CONTACT YOU IN 10-14 DAYS WITH YOUR RESULTS.   Next colonoscopy in 5-10 years.    Colonoscopy Care After Read the instructions outlined below and refer to this sheet in the next week. These discharge instructions provide you with general information on caring for yourself after you leave the hospital. While your treatment has been planned according to the most current medical practices available, unavoidable complications occasionally occur. If you have any problems or questions after discharge, call DR. FIELDS, [phone].  ACTIVITY  You may resume your regular activity, but move at a slower pace for the next 24 hours.   Take frequent rest periods for the next 24 hours.   Walking will help get rid of the air and reduce the bloated feeling in your belly (abdomen).   No driving for 24 hours (because of the medicine (anesthesia) used during the test).   You may shower.   Do not sign any important legal documents or operate any machinery for 24 hours (because of the anesthesia used during the test).    NUTRITION  Drink plenty of fluids.   You may resume your normal diet as instructed by your doctor.   Begin with a light meal and progress to your normal diet. Heavy or fried foods are harder to digest and may make you feel sick  to your stomach (nauseated).   Avoid alcoholic beverages for 24 hours or as instructed.    MEDICATIONS  You may resume your normal medications.   WHAT YOU CAN EXPECT TODAY  Some feelings of bloating in the abdomen.   Passage of more gas than usual.   Spotting of blood in your stool or on the toilet paper  .  IF YOU HAD POLYPS REMOVED DURING THE COLONOSCOPY:  Eat a soft diet IF YOU HAVE NAUSEA, BLOATING, ABDOMINAL PAIN, OR VOMITING.    FINDING OUT THE RESULTS OF YOUR TEST Not all test results are available during your visit. DR. Oneida Alar WILL CALL YOU WITHIN 14 DAYS OF YOUR PROCEDUE WITH YOUR RESULTS. Do not assume everything is normal if you have not heard from DR. FIELDS, CALL HER OFFICE AT [phone].  SEEK IMMEDIATE MEDICAL ATTENTION AND CALL THE OFFICE: [phone] IF:  You have more than a spotting of blood in your stool.   Your belly is swollen (abdominal distention).   You are nauseated or vomiting.   You have a temperature over 101F.   You have abdominal pain or discomfort that is severe or gets worse throughout the day.   High-Fiber Diet A high-fiber diet changes your normal diet to include more whole grains, legumes, fruits, and vegetables. Changes in the diet involve replacing refined carbohydrates with unrefined foods. The calorie level of the diet is essentially unchanged. The Dietary Reference Intake (recommended amount) for adult males is 26  grams per day. For adult females, it is 25 grams per day. Pregnant and lactating women should consume 28 grams of fiber per day. Fiber is the intact part of a plant that is not broken down during digestion. Functional fiber is fiber that has been isolated from the plant to provide a beneficial effect in the body. PURPOSE  Increase stool bulk.   Ease and regulate bowel movements.   Lower cholesterol.   REDUCE RISK OF COLON CANCER  INDICATIONS THAT YOU NEED MORE FIBER  Constipation and hemorrhoids.    Uncomplicated diverticulosis (intestine condition) and irritable bowel syndrome.   Weight management.   As a protective measure against hardening of the arteries (atherosclerosis), diabetes, and cancer.   GUIDELINES FOR INCREASING FIBER IN THE DIET  Start adding fiber to the diet slowly. A gradual increase of about 5 more grams (2 slices of whole-wheat bread, 2 servings of most fruits or vegetables, or 1 bowl of high-fiber cereal) per day is best. Too rapid an increase in fiber may result in constipation, flatulence, and bloating.   Drink enough water and fluids to keep your urine clear or pale yellow. Water, juice, or caffeine-free drinks are recommended. Not drinking enough fluid may cause constipation.   Eat a variety of high-fiber foods rather than one type of fiber.   Try to increase your intake of fiber through using high-fiber foods rather than fiber pills or supplements that contain small amounts of fiber.   The goal is to change the types of food eaten. Do not supplement your present diet with high-fiber foods, but replace foods in your present diet.   INCLUDE A VARIETY OF FIBER SOURCES  Replace refined and processed grains with whole grains, canned fruits with fresh fruits, and incorporate other fiber sources. White rice, white breads, and most bakery goods contain little or no fiber.   Brown whole-grain rice, buckwheat oats, and many fruits and vegetables are all good sources of fiber. These include: broccoli, Brussels sprouts, cabbage, cauliflower, beets, sweet potatoes, white potatoes (skin on), carrots, tomatoes, eggplant, squash, berries, fresh fruits, and dried fruits.   Cereals appear to be the richest source of fiber. Cereal fiber is found in whole grains and bran. Bran is the fiber-rich outer coat of cereal grain, which is largely removed in refining. In whole-grain cereals, the bran remains. In breakfast cereals, the largest amount of fiber is found in those with  "bran" in their names. The fiber content is sometimes indicated on the label.   You may need to include additional fruits and vegetables each day.   In baking, for 1 cup white flour, you may use the following substitutions:   1 cup whole-wheat flour minus 2 tablespoons.   1/2 cup white flour plus 1/2 cup whole-wheat flour.   Polyps, Colon  A polyp is extra tissue that grows inside your body. Colon polyps grow in the large intestine. The large intestine, also called the colon, is part of your digestive system. It is a long, hollow tube at the end of your digestive tract where your body makes and stores stool. Most polyps are not dangerous. They are benign. This means they are not cancerous. But over time, some types of polyps can turn into cancer. Polyps that are smaller than a pea are usually not harmful. But larger polyps could someday become or may already be cancerous. To be safe, doctors remove all polyps and test them.   WHO GETS POLYPS? Anyone can get polyps, but certain people  are more likely than others. You may have a greater chance of getting polyps if:  You are over 50.   You have had polyps before.   Someone in your family has had polyps.   Someone in your family has had cancer of the large intestine.   Find out if someone in your family has had polyps. You may also be more likely to get polyps if you:   Eat a lot of fatty foods   Smoke   Drink alcohol   Do not exercise  Eat too much   PREVENTION There is not one sure way to prevent polyps. You might be able to lower your risk of getting them if you:  Eat more fruits and vegetables and less fatty food.   Do not smoke.   Avoid alcohol.   Exercise every day.   Lose weight if you are overweight.   Eating more calcium and folate can also lower your risk of getting polyps. Some foods that are rich in calcium are milk, cheese, and broccoli. Some foods that are rich in folate are chickpeas, kidney beans, and  spinach.   Hemorrhoids Hemorrhoids are dilated (enlarged) veins around the rectum. Sometimes clots will form in the veins. This makes them swollen and painful. These are called thrombosed hemorrhoids. Causes of hemorrhoids include:  Constipation.   Straining to have a bowel movement.   HEAVY LIFTING  HOME CARE INSTRUCTIONS  Eat a well balanced diet and drink 6 to 8 glasses of water every day to avoid constipation. You may also use a bulk laxative.   Avoid straining to have bowel movements.   Keep anal area dry and clean.   Do not use a donut shaped pillow or sit on the toilet for long periods. This increases blood pooling and pain.   Move your bowels when your body has the urge; this will require less straining and will decrease pain and pressure.

## 2016-08-10 NOTE — H&P (Signed)
  Primary Care Physician:  [name] Primary Gastroenterologist:  Dr. Oneida Alar  Pre-Procedure History & Physical: HPI:  Rachel [name] is a 56 y.o. female here for [place].  Past Medical History:  Diagnosis Date  . Anxiety   . Depression   . Hypertension     Past Surgical History:  Procedure Laterality Date  . cesarian  1990    Prior to Admission medications   Medication Sig Start Date End Date Taking? Authorizing Provider  clonazePAM (KLONOPIN) 1 MG tablet Take 1 mg by mouth 3 (three) times daily as needed for anxiety.    Yes Historical Provider, MD  ibuprofen (ADVIL,MOTRIN) 200 MG tablet Take 400 mg by mouth every 6 (six) hours as needed (pain).   Yes Historical Provider, MD  lisinopril-hydrochlorothiazide (PRINZIDE,ZESTORETIC) 20-25 MG tablet Take 1 tablet by mouth daily.   Yes Historical Provider, MD  PROAIR HFA 108 (90 Base) MCG/ACT inhaler Inhale 2 puffs into the lungs every 6 (six) hours. Scheduled doses. 07/25/16  Yes Historical Provider, MD  FLUoxetine (PROZAC) 20 MG capsule Take 20 mg by mouth daily.      Historical Provider, MD    Allergies as of 07/22/2016 - Review Complete 07/13/2016  Allergen Reaction Noted  . Penicillins Itching 04/12/2014    History reviewed. No pertinent family history.  Social History   Social History  . Marital status: Single    Spouse name: N/A  . Number of children: N/A  . Years of education: N/A   Occupational History  . Not on file.   Social History Main Topics  . Smoking status: Current Every Day Smoker    Packs/day: 1.00    Years: 25.00    Types: Cigarettes  . Smokeless tobacco: Never Used  . Alcohol use Yes     Comment: occ  . Drug use: Yes     Comment: marijuana on occ  . Sexual activity: Not on file   Other Topics Concern  . Not on file   Social History Narrative   Right handed. Single.  3 kids.  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  Caffeine 3 16 oz coffee, 4 sodas    Review of  Systems: See HPI, otherwise negative ROS   Physical Exam: BP 115/75   Pulse 95   Temp 98.4 F (36.9 C) (Oral)   Resp 15   Ht 5' (1.524 m)   Wt 203 lb (92.1 kg)   SpO2 96%   BMI 39.65 kg/m  General:   Alert,  pleasant and cooperative in NAD Head:  Normocephalic and atraumatic. Neck:  Supple; Lungs:  Clear throughout to auscultation.    Heart:  Regular rate and rhythm. Abdomen:  Soft, nontender and nondistended. Normal bowel sounds, without guarding, and without rebound.   Neurologic:  Alert and  oriented x4;  grossly normal neurologically.  Impression/Plan:     SCREENING  Plan:  1. TCS TODAY. DISCUSSED PROCEDURE, BENEFITS, & RISKS: < 1% chance of medication reaction, bleeding, perforation, or rupture of spleen/liver.

## 2016-08-10 NOTE — Telephone Encounter (Signed)
Noted  

## 2016-08-11 ENCOUNTER — Telehealth: Payer: Self-pay | Admitting: Gastroenterology

## 2016-08-11 NOTE — Telephone Encounter (Signed)
Please call pt. She had TWO simple adenomas  AND 12 HYPERPLASTIC POLYPS removed from her colon.   CONTINUE YOUR WEIGHT LOSS EFFORTS. LOSE TEN POUNDS.    SHE SHOULD STOP SMOKING. IT INCREASES HER RISK FOR COLON AND ESOPHAGEAL CANCER.  DRINK WATER TO KEEP YOUR URINE LIGHT YELLOW.  FOLLOW A HIGH FIBER DIET. AVOID ITEMS THAT CAUSE BLOATING & GAS.   Next colonoscopy in 5-10 years.

## 2016-08-11 NOTE — Telephone Encounter (Signed)
Reminder in epic °

## 2016-08-12 ENCOUNTER — Encounter (HOSPITAL_COMMUNITY): Payer: Self-pay | Admitting: Gastroenterology

## 2016-08-12 NOTE — Telephone Encounter (Signed)
Tried to call pt and VM is full. Mailing a letter to call.

## 2016-08-26 DIAGNOSIS — F419 Anxiety disorder, unspecified: Secondary | ICD-10-CM | POA: Diagnosis not present

## 2016-08-26 DIAGNOSIS — J209 Acute bronchitis, unspecified: Secondary | ICD-10-CM | POA: Diagnosis not present

## 2016-08-26 DIAGNOSIS — L988 Other specified disorders of the skin and subcutaneous tissue: Secondary | ICD-10-CM | POA: Diagnosis not present

## 2016-08-26 DIAGNOSIS — Z6841 Body Mass Index (BMI) 40.0 and over, adult: Secondary | ICD-10-CM | POA: Diagnosis not present

## 2016-08-26 DIAGNOSIS — Z1389 Encounter for screening for other disorder: Secondary | ICD-10-CM | POA: Diagnosis not present

## 2016-08-26 DIAGNOSIS — J449 Chronic obstructive pulmonary disease, unspecified: Secondary | ICD-10-CM | POA: Diagnosis not present

## 2016-10-06 DIAGNOSIS — J441 Chronic obstructive pulmonary disease with (acute) exacerbation: Secondary | ICD-10-CM | POA: Diagnosis not present

## 2016-10-06 DIAGNOSIS — Z6841 Body Mass Index (BMI) 40.0 and over, adult: Secondary | ICD-10-CM | POA: Diagnosis not present

## 2016-10-06 DIAGNOSIS — Z1389 Encounter for screening for other disorder: Secondary | ICD-10-CM | POA: Diagnosis not present

## 2016-10-09 DIAGNOSIS — J80 Acute respiratory distress syndrome: Secondary | ICD-10-CM | POA: Diagnosis not present

## 2016-10-09 DIAGNOSIS — J1000 Influenza due to other identified influenza virus with unspecified type of pneumonia: Secondary | ICD-10-CM | POA: Diagnosis not present

## 2016-10-09 DIAGNOSIS — J101 Influenza due to other identified influenza virus with other respiratory manifestations: Secondary | ICD-10-CM | POA: Diagnosis not present

## 2016-10-09 DIAGNOSIS — Z8701 Personal history of pneumonia (recurrent): Secondary | ICD-10-CM | POA: Diagnosis not present

## 2016-10-09 DIAGNOSIS — Z6839 Body mass index (BMI) 39.0-39.9, adult: Secondary | ICD-10-CM | POA: Diagnosis not present

## 2016-10-09 DIAGNOSIS — Z885 Allergy status to narcotic agent status: Secondary | ICD-10-CM | POA: Diagnosis not present

## 2016-10-09 DIAGNOSIS — J189 Pneumonia, unspecified organism: Secondary | ICD-10-CM | POA: Diagnosis not present

## 2016-10-09 DIAGNOSIS — Z79899 Other long term (current) drug therapy: Secondary | ICD-10-CM | POA: Diagnosis not present

## 2016-10-09 DIAGNOSIS — I10 Essential (primary) hypertension: Secondary | ICD-10-CM | POA: Diagnosis not present

## 2016-10-09 DIAGNOSIS — F17200 Nicotine dependence, unspecified, uncomplicated: Secondary | ICD-10-CM | POA: Diagnosis not present

## 2016-10-09 DIAGNOSIS — F410 Panic disorder [episodic paroxysmal anxiety] without agoraphobia: Secondary | ICD-10-CM | POA: Diagnosis not present

## 2016-10-09 DIAGNOSIS — J440 Chronic obstructive pulmonary disease with acute lower respiratory infection: Secondary | ICD-10-CM | POA: Diagnosis not present

## 2016-10-09 DIAGNOSIS — E669 Obesity, unspecified: Secondary | ICD-10-CM | POA: Diagnosis not present

## 2016-10-09 DIAGNOSIS — Z880 Allergy status to penicillin: Secondary | ICD-10-CM | POA: Diagnosis not present

## 2016-10-10 ENCOUNTER — Inpatient Hospital Stay (HOSPITAL_COMMUNITY)
Admission: AD | Admit: 2016-10-10 | Discharge: 2016-10-25 | DRG: 870 | Disposition: A | Discharge status: Home Health | Payer: BLUE CROSS/BLUE SHIELD | Source: Other Acute Inpatient Hospital | Attending: Family Medicine | Admitting: Family Medicine

## 2016-10-10 ENCOUNTER — Inpatient Hospital Stay (HOSPITAL_COMMUNITY): Payer: BLUE CROSS/BLUE SHIELD

## 2016-10-10 DIAGNOSIS — F418 Other specified anxiety disorders: Secondary | ICD-10-CM | POA: Diagnosis present

## 2016-10-10 DIAGNOSIS — I471 Supraventricular tachycardia: Secondary | ICD-10-CM | POA: Diagnosis not present

## 2016-10-10 DIAGNOSIS — Z452 Encounter for adjustment and management of vascular access device: Secondary | ICD-10-CM | POA: Diagnosis not present

## 2016-10-10 DIAGNOSIS — Z4659 Encounter for fitting and adjustment of other gastrointestinal appliance and device: Secondary | ICD-10-CM

## 2016-10-10 DIAGNOSIS — R34 Anuria and oliguria: Secondary | ICD-10-CM | POA: Diagnosis present

## 2016-10-10 DIAGNOSIS — R0603 Acute respiratory distress: Secondary | ICD-10-CM

## 2016-10-10 DIAGNOSIS — A4189 Other specified sepsis: Principal | ICD-10-CM | POA: Diagnosis present

## 2016-10-10 DIAGNOSIS — R0602 Shortness of breath: Secondary | ICD-10-CM | POA: Diagnosis not present

## 2016-10-10 DIAGNOSIS — F3132 Bipolar disorder, current episode depressed, moderate: Secondary | ICD-10-CM | POA: Diagnosis not present

## 2016-10-10 DIAGNOSIS — I248 Other forms of acute ischemic heart disease: Secondary | ICD-10-CM | POA: Diagnosis present

## 2016-10-10 DIAGNOSIS — Z885 Allergy status to narcotic agent status: Secondary | ICD-10-CM

## 2016-10-10 DIAGNOSIS — I272 Pulmonary hypertension, unspecified: Secondary | ICD-10-CM | POA: Diagnosis not present

## 2016-10-10 DIAGNOSIS — I10 Essential (primary) hypertension: Secondary | ICD-10-CM | POA: Diagnosis not present

## 2016-10-10 DIAGNOSIS — J80 Acute respiratory distress syndrome: Secondary | ICD-10-CM

## 2016-10-10 DIAGNOSIS — J9811 Atelectasis: Secondary | ICD-10-CM | POA: Diagnosis not present

## 2016-10-10 DIAGNOSIS — R6521 Severe sepsis with septic shock: Secondary | ICD-10-CM | POA: Diagnosis not present

## 2016-10-10 DIAGNOSIS — J101 Influenza due to other identified influenza virus with other respiratory manifestations: Secondary | ICD-10-CM | POA: Diagnosis present

## 2016-10-10 DIAGNOSIS — J439 Emphysema, unspecified: Secondary | ICD-10-CM | POA: Diagnosis present

## 2016-10-10 DIAGNOSIS — N179 Acute kidney failure, unspecified: Secondary | ICD-10-CM | POA: Diagnosis not present

## 2016-10-10 DIAGNOSIS — E876 Hypokalemia: Secondary | ICD-10-CM | POA: Diagnosis not present

## 2016-10-10 DIAGNOSIS — D649 Anemia, unspecified: Secondary | ICD-10-CM | POA: Diagnosis present

## 2016-10-10 DIAGNOSIS — Z9911 Dependence on respirator [ventilator] status: Secondary | ICD-10-CM | POA: Diagnosis not present

## 2016-10-10 DIAGNOSIS — T380X5A Adverse effect of glucocorticoids and synthetic analogues, initial encounter: Secondary | ICD-10-CM | POA: Diagnosis present

## 2016-10-10 DIAGNOSIS — J432 Centrilobular emphysema: Secondary | ICD-10-CM

## 2016-10-10 DIAGNOSIS — J9600 Acute respiratory failure, unspecified whether with hypoxia or hypercapnia: Secondary | ICD-10-CM | POA: Diagnosis not present

## 2016-10-10 DIAGNOSIS — J9601 Acute respiratory failure with hypoxia: Secondary | ICD-10-CM

## 2016-10-10 DIAGNOSIS — J9621 Acute and chronic respiratory failure with hypoxia: Secondary | ICD-10-CM

## 2016-10-10 DIAGNOSIS — J1001 Influenza due to other identified influenza virus with the same other identified influenza virus pneumonia: Secondary | ICD-10-CM | POA: Diagnosis not present

## 2016-10-10 DIAGNOSIS — F17210 Nicotine dependence, cigarettes, uncomplicated: Secondary | ICD-10-CM | POA: Diagnosis present

## 2016-10-10 DIAGNOSIS — N170 Acute kidney failure with tubular necrosis: Secondary | ICD-10-CM | POA: Diagnosis not present

## 2016-10-10 DIAGNOSIS — E870 Hyperosmolality and hypernatremia: Secondary | ICD-10-CM | POA: Diagnosis not present

## 2016-10-10 DIAGNOSIS — Z880 Allergy status to penicillin: Secondary | ICD-10-CM

## 2016-10-10 DIAGNOSIS — J90 Pleural effusion, not elsewhere classified: Secondary | ICD-10-CM | POA: Diagnosis not present

## 2016-10-10 DIAGNOSIS — F419 Anxiety disorder, unspecified: Secondary | ICD-10-CM | POA: Diagnosis not present

## 2016-10-10 DIAGNOSIS — Z4682 Encounter for fitting and adjustment of non-vascular catheter: Secondary | ICD-10-CM | POA: Diagnosis not present

## 2016-10-10 DIAGNOSIS — Z781 Physical restraint status: Secondary | ICD-10-CM

## 2016-10-10 DIAGNOSIS — J9690 Respiratory failure, unspecified, unspecified whether with hypoxia or hypercapnia: Secondary | ICD-10-CM | POA: Diagnosis not present

## 2016-10-10 DIAGNOSIS — E861 Hypovolemia: Secondary | ICD-10-CM | POA: Diagnosis not present

## 2016-10-10 DIAGNOSIS — K219 Gastro-esophageal reflux disease without esophagitis: Secondary | ICD-10-CM | POA: Diagnosis not present

## 2016-10-10 DIAGNOSIS — R109 Unspecified abdominal pain: Secondary | ICD-10-CM

## 2016-10-10 DIAGNOSIS — J1000 Influenza due to other identified influenza virus with unspecified type of pneumonia: Secondary | ICD-10-CM | POA: Diagnosis not present

## 2016-10-10 DIAGNOSIS — E871 Hypo-osmolality and hyponatremia: Secondary | ICD-10-CM | POA: Diagnosis present

## 2016-10-10 DIAGNOSIS — J189 Pneumonia, unspecified organism: Secondary | ICD-10-CM | POA: Diagnosis not present

## 2016-10-10 DIAGNOSIS — Z9889 Other specified postprocedural states: Secondary | ICD-10-CM

## 2016-10-10 DIAGNOSIS — F319 Bipolar disorder, unspecified: Secondary | ICD-10-CM | POA: Diagnosis present

## 2016-10-10 DIAGNOSIS — J111 Influenza due to unidentified influenza virus with other respiratory manifestations: Secondary | ICD-10-CM

## 2016-10-10 DIAGNOSIS — R918 Other nonspecific abnormal finding of lung field: Secondary | ICD-10-CM | POA: Diagnosis not present

## 2016-10-10 LAB — LACTIC ACID, PLASMA: Lactic Acid, Venous: 1.3 mmol/L (ref 0.5–1.9)

## 2016-10-10 LAB — COMPREHENSIVE METABOLIC PANEL
ALBUMIN: 3 g/dL — AB (ref 3.5–5.0)
ALK PHOS: 59 U/L (ref 38–126)
ALT: 14 U/L (ref 14–54)
AST: 47 U/L — AB (ref 15–41)
Anion gap: 10 (ref 5–15)
BILIRUBIN TOTAL: 0.4 mg/dL (ref 0.3–1.2)
BUN: 20 mg/dL (ref 6–20)
CALCIUM: 8.5 mg/dL — AB (ref 8.9–10.3)
CO2: 24 mmol/L (ref 22–32)
CREATININE: 1.13 mg/dL — AB (ref 0.44–1.00)
Chloride: 101 mmol/L (ref 101–111)
GFR calc Af Amer: >60 mL/min (ref >60)
GFR calc non Af Amer: 54 mL/min — ABNORMAL LOW (ref >60)
GLUCOSE: 163 mg/dL — AB (ref 65–99)
Potassium: 4.3 mmol/L (ref 3.5–5.1)
Sodium: 135 mmol/L (ref 135–145)
Total Protein: 6.5 g/dL (ref 6.5–8.1)

## 2016-10-10 LAB — CBC WITH DIFFERENTIAL/PLATELET
BASOS ABS: 0 10*3/uL (ref 0.0–0.1)
BASOS PCT: 0 %
Eosinophils Absolute: 0 10*3/uL (ref 0.0–0.7)
Eosinophils Relative: 0 %
HEMATOCRIT: 35.2 % — AB (ref 36.0–46.0)
Hemoglobin: 11.6 g/dL — ABNORMAL LOW (ref 12.0–15.0)
Lymphocytes Relative: 5 %
Lymphs Abs: 0.7 10*3/uL (ref 0.7–4.0)
MCH: 28.7 pg (ref 26.0–34.0)
MCHC: 33 g/dL (ref 30.0–36.0)
MCV: 87.1 fL (ref 78.0–100.0)
MONO ABS: 0.4 10*3/uL (ref 0.1–1.0)
Monocytes Relative: 3 %
NEUTROS PCT: 92 %
Neutro Abs: 14.1 10*3/uL — ABNORMAL HIGH (ref 1.7–7.7)
Platelets: 263 10*3/uL (ref 150–400)
RBC: 4.04 MIL/uL (ref 3.87–5.11)
RDW: 14.9 % (ref 11.5–15.5)
WBC: 15.2 10*3/uL — AB (ref 4.0–10.5)

## 2016-10-10 LAB — PHOSPHORUS
Phosphorus: 3.4 mg/dL (ref 2.5–4.6)
Phosphorus: 3.6 mg/dL (ref 2.5–4.6)

## 2016-10-10 LAB — BRAIN NATRIURETIC PEPTIDE: B NATRIURETIC PEPTIDE 5: 17.1 pg/mL (ref 0.0–100.0)

## 2016-10-10 LAB — POCT I-STAT 3, ART BLOOD GAS (G3+)
ACID-BASE DEFICIT: 1 mmol/L (ref 0.0–2.0)
Bicarbonate: 25.5 mmol/L (ref 20.0–28.0)
O2 Saturation: 98 %
PH ART: 7.307 — AB (ref 7.350–7.450)
PO2 ART: 113 mmHg — AB (ref 83.0–108.0)
Patient temperature: 98.6
TCO2: 27 mmol/L (ref 0–100)
pCO2 arterial: 51.1 mmHg — ABNORMAL HIGH (ref 32.0–48.0)

## 2016-10-10 LAB — GLUCOSE, CAPILLARY
GLUCOSE-CAPILLARY: 179 mg/dL — AB (ref 65–99)
Glucose-Capillary: 158 mg/dL — ABNORMAL HIGH (ref 65–99)
Glucose-Capillary: 145 mg/dL — ABNORMAL HIGH (ref 65–99)

## 2016-10-10 LAB — INFLUENZA PANEL BY PCR (TYPE A & B)
Influenza A By PCR: NEGATIVE
Influenza B By PCR: POSITIVE — AB

## 2016-10-10 LAB — PROCALCITONIN: PROCALCITONIN: 0.31 ng/mL

## 2016-10-10 LAB — CORTISOL: Cortisol, Plasma: 7.5 ug/dL

## 2016-10-10 LAB — MRSA PCR SCREENING: MRSA BY PCR: NEGATIVE

## 2016-10-10 LAB — TROPONIN I
Troponin I: 0.04 ng/mL (ref <0.03)
Troponin I: <0.03 ng/mL (ref <0.03)

## 2016-10-10 LAB — MAGNESIUM
Magnesium: 2.1 mg/dL (ref 1.7–2.4)
Magnesium: 2.2 mg/dL (ref 1.7–2.4)

## 2016-10-10 LAB — STREP PNEUMONIAE URINARY ANTIGEN: Strep Pneumo Urinary Antigen: NEGATIVE

## 2016-10-10 MED ORDER — DEXTROSE 5 % IV SOLN
2.0000 g | INTRAVENOUS | Status: AC
  Administered 2016-10-10 – 2016-10-16 (×7): 2 g via INTRAVENOUS
  Filled 2016-10-10 (×7): qty 2

## 2016-10-10 MED ORDER — SODIUM CHLORIDE 0.9 % IV SOLN
250.0000 mL | INTRAVENOUS | Status: DC | PRN
  Administered 2016-10-16 – 2016-10-21 (×3): 250 mL via INTRAVENOUS
  Administered 2016-10-22: 500 mL via INTRAVENOUS

## 2016-10-10 MED ORDER — OSELTAMIVIR PHOSPHATE 75 MG PO CAPS
75.0000 mg | ORAL_CAPSULE | Freq: Two times a day (BID) | ORAL | Status: DC
  Administered 2016-10-10 (×2): 75 mg via ORAL
  Filled 2016-10-10 (×3): qty 1

## 2016-10-10 MED ORDER — VANCOMYCIN HCL 10 G IV SOLR
1250.0000 mg | Freq: Two times a day (BID) | INTRAVENOUS | Status: DC
  Administered 2016-10-11: 1250 mg via INTRAVENOUS
  Filled 2016-10-10 (×2): qty 1250

## 2016-10-10 MED ORDER — VANCOMYCIN HCL 10 G IV SOLR
2000.0000 mg | Freq: Once | INTRAVENOUS | Status: AC
  Administered 2016-10-10: 2000 mg via INTRAVENOUS
  Filled 2016-10-10: qty 2000

## 2016-10-10 MED ORDER — DEXTROSE 5 % IV SOLN
500.0000 mg | INTRAVENOUS | Status: DC
  Administered 2016-10-10 – 2016-10-11 (×2): 500 mg via INTRAVENOUS
  Filled 2016-10-10 (×3): qty 500

## 2016-10-10 MED ORDER — VITAL HIGH PROTEIN PO LIQD
1000.0000 mL | ORAL | Status: DC
  Administered 2016-10-10 – 2016-10-12 (×3): 1000 mL

## 2016-10-10 MED ORDER — FENTANYL 2500MCG IN NS 250ML (10MCG/ML) PREMIX INFUSION
25.0000 ug/h | INTRAVENOUS | Status: DC
  Administered 2016-10-10: 100 ug/h via INTRAVENOUS
  Administered 2016-10-11: 250 ug/h via INTRAVENOUS
  Administered 2016-10-11 – 2016-10-12 (×3): 200 ug/h via INTRAVENOUS
  Administered 2016-10-12: 300 ug/h via INTRAVENOUS
  Administered 2016-10-12: 400 ug/h via INTRAVENOUS
  Administered 2016-10-13: 350 ug/h via INTRAVENOUS
  Administered 2016-10-13 (×2): 300 ug/h via INTRAVENOUS
  Administered 2016-10-14: 225 ug/h via INTRAVENOUS
  Administered 2016-10-14: 300 ug/h via INTRAVENOUS
  Administered 2016-10-15: 350 ug/h via INTRAVENOUS
  Filled 2016-10-10 (×13): qty 250

## 2016-10-10 MED ORDER — PRO-STAT SUGAR FREE PO LIQD
30.0000 mL | Freq: Two times a day (BID) | ORAL | Status: DC
  Administered 2016-10-10 – 2016-10-12 (×5): 30 mL
  Filled 2016-10-10 (×5): qty 30

## 2016-10-10 MED ORDER — ACETAMINOPHEN 325 MG PO TABS: 650.0000 mg | ORAL_TABLET | ORAL | Status: DC | PRN

## 2016-10-10 MED ORDER — MIDAZOLAM HCL 2 MG/2ML IJ SOLN
2.0000 mg | INTRAMUSCULAR | Status: DC | PRN
  Administered 2016-10-11 – 2016-10-21 (×36): 2 mg via INTRAVENOUS
  Filled 2016-10-10 (×38): qty 2

## 2016-10-10 MED ORDER — NOREPINEPHRINE BITARTRATE 1 MG/ML IV SOLN
0.0000 ug/min | INTRAVENOUS | Status: DC
  Administered 2016-10-10: 4 ug/min via INTRAVENOUS
  Filled 2016-10-10: qty 4

## 2016-10-10 MED ORDER — ORAL CARE MOUTH RINSE
15.0000 mL | Freq: Four times a day (QID) | OROMUCOSAL | Status: DC
  Administered 2016-10-10 – 2016-10-13 (×9): 15 mL via OROMUCOSAL

## 2016-10-10 MED ORDER — FAMOTIDINE IN NACL 20-0.9 MG/50ML-% IV SOLN
20.0000 mg | Freq: Two times a day (BID) | INTRAVENOUS | Status: DC
  Administered 2016-10-10 (×2): 20 mg via INTRAVENOUS
  Filled 2016-10-10 (×3): qty 50

## 2016-10-10 MED ORDER — FENTANYL CITRATE (PF) 100 MCG/2ML IJ SOLN: 50.0000 ug | Freq: Once | INTRAMUSCULAR | Status: DC

## 2016-10-10 MED ORDER — ENOXAPARIN SODIUM 40 MG/0.4ML ~~LOC~~ SOLN
40.0000 mg | SUBCUTANEOUS | Status: DC
  Administered 2016-10-10 – 2016-10-11 (×2): 40 mg via SUBCUTANEOUS
  Filled 2016-10-10 (×3): qty 0.4

## 2016-10-10 MED ORDER — ONDANSETRON HCL 4 MG/2ML IJ SOLN
4.0000 mg | Freq: Four times a day (QID) | INTRAMUSCULAR | Status: DC | PRN
  Administered 2016-10-17: 4 mg via INTRAVENOUS
  Filled 2016-10-10: qty 2

## 2016-10-10 MED ORDER — MIDAZOLAM HCL 2 MG/2ML IJ SOLN
2.0000 mg | INTRAMUSCULAR | Status: AC | PRN
  Administered 2016-10-10 – 2016-10-11 (×3): 2 mg via INTRAVENOUS
  Filled 2016-10-10 (×3): qty 2

## 2016-10-10 MED ORDER — CHLORHEXIDINE GLUCONATE 0.12% ORAL RINSE (MEDLINE KIT)
15.0000 mL | Freq: Two times a day (BID) | OROMUCOSAL | Status: DC
  Administered 2016-10-10 – 2016-10-12 (×5): 15 mL via OROMUCOSAL

## 2016-10-10 MED ORDER — FENTANYL BOLUS VIA INFUSION
50.0000 ug | INTRAVENOUS | Status: DC | PRN
  Administered 2016-10-11 – 2016-10-13 (×4): 50 ug via INTRAVENOUS
  Filled 2016-10-10: qty 50

## 2016-10-10 MED ORDER — ALBUTEROL SULFATE (2.5 MG/3ML) 0.083% IN NEBU
2.5000 mg | INHALATION_SOLUTION | RESPIRATORY_TRACT | Status: DC | PRN
  Administered 2016-10-17: 2.5 mg via RESPIRATORY_TRACT
  Filled 2016-10-10: qty 3

## 2016-10-10 MED ORDER — IPRATROPIUM-ALBUTEROL 0.5-2.5 (3) MG/3ML IN SOLN
3.0000 mL | Freq: Four times a day (QID) | RESPIRATORY_TRACT | Status: DC
  Administered 2016-10-10 – 2016-10-13 (×11): 3 mL via RESPIRATORY_TRACT
  Filled 2016-10-10 (×11): qty 3

## 2016-10-10 NOTE — H&P (Signed)
PULMONARY / CRITICAL CARE MEDICINE   Name: Rachel [name]  MRN: [id number] DOB: [birth year]    ADMISSION DATE:  10/10/2016 CONSULTATION DATE:  10/10/2016   REFERRING MD:  [facility] [place]:  Respiratory failure   HISTORY OF PRESENT ILLNESS:   56 y/o smoker with a past medical history of emphysema admitted on 4/14 from [facility] after she was admitted there on 4/13 for Acute respiratory failure with hypoxemia secondary to Influenza B pneumonia.  She smoked 2 ppd and has done so for at least 30 years.  Her family provides the history because she was intubated and sedated on my exam.  They note that for the last two weeks she has been experiencing cough, dyspnea and feeling bad.  At [facility] she was reportedly found to have influenza B.  Her hypoxemia and dyspnea progressed to the point that she was requiring intubation on 4/14. Transfer to our facility was requested.    PAST MEDICAL HISTORY :  She  has a past medical history of Anxiety; Depression; and Hypertension.  PAST SURGICAL HISTORY: She  has a past surgical history that includes cesarian (1990) and Colonoscopy (N/A, 08/10/2016).  Allergies  Allergen Reactions  . Oxycodone Nausea And Vomiting    Codeine, hydrocodone, most pain pills.  . Penicillins Itching and Nausea And Vomiting    Has patient had a PCN reaction causing immediate rash, facial/tongue/throat swelling, SOB or lightheadedness with hypotension: Yes, around the eyes Has patient had a PCN reaction causing severe rash involving mucus membranes or skin necrosis: No Has patient had a PCN reaction that required hospitalization: No Has patient had a PCN reaction occurring within the last 10 years: No  If all of the above answers are "NO", then may proceed with Cephalosporin use.     No current facility-administered medications on file prior to encounter.    Current Outpatient Prescriptions on File Prior to Encounter  Medication  Sig  . clonazePAM (KLONOPIN) 1 MG tablet Take 1 mg by mouth 3 (three) times daily as needed for anxiety.   Marland Kitchen FLUoxetine (PROZAC) 20 MG capsule Take 20 mg by mouth daily.    Marland Kitchen ibuprofen (ADVIL,MOTRIN) 200 MG tablet Take 400 mg by mouth every 6 (six) hours as needed (pain).  Marland Kitchen lisinopril-hydrochlorothiazide (PRINZIDE,ZESTORETIC) 20-25 MG tablet Take 1 tablet by mouth daily.  Marland Kitchen PROAIR HFA 108 (90 Base) MCG/ACT inhaler Inhale 2 puffs into the lungs every 6 (six) hours. Scheduled doses.    FAMILY HISTORY/SOCIAL HISTORY/REVIEW OF SYSTEMS:   Cannot obtain due to intubation  SUBJECTIVE:  As above  VITAL SIGNS: Pulse 92   Resp 16   HEMODYNAMICS:    VENTILATOR SETTINGS: Vent Mode: PRVC FiO2 (%):  [100 %] 100 % Set Rate:  [16 bmp] 16 bmp Vt Set:  [530 mL] 530 mL PEEP:  [5 cmH20] 5 cmH20 Plateau Pressure:  [38 cmH20] 38 cmH20  INTAKE / OUTPUT: No intake/output data recorded.  PHYSICAL EXAMINATION: General:  Sedated on vent Neuro:  Sedated on vent HEENT:  HEENT ETT in place, PERRL Cardiovascular:  RRR, no mgr Lungs:  Wheezing bilaterally, vent supported  breathing Abdomen:  BS+, soft, nontender Musculoskeletal:  Normal bulk and tone Skin:  No rash or skin breakdown  LABS:  BMET No results for input(s): NA, K, CL, CO2, BUN, CREATININE, GLUCOSE in the last 168 hours.  Electrolytes No results for input(s): CALCIUM, MG, PHOS in the last 168 hours.  CBC No results for input(s): WBC, HGB, HCT,  PLT in the last 168 hours.  Coag's No results for input(s): APTT, INR in the last 168 hours.  Sepsis Markers No results for input(s): LATICACIDVEN, PROCALCITON, O2SATVEN in the last 168 hours.  ABG No results for input(s): PHART, PCO2ART, PO2ART in the last 168 hours.  Liver Enzymes No results for input(s): AST, ALT, ALKPHOS, BILITOT, ALBUMIN in the last 168 hours.  Cardiac Enzymes No results for input(s): TROPONINI, PROBNP in the last 168 hours.  Glucose  Recent Labs Lab  10/10/16 1234  GLUCAP 158*    Imaging No results found.   STUDIES:    CULTURES: 4/14 resp culture 4/14 urine culture 4/14 blood culture 4/14 influenza pcr panel  ANTIBIOTICS: 4/13 levaquin > 4/14 4/13 Tamiflu >  4/14 Vancomycin >  4/14 Ceftriaxone >  4/14 Azithromycin >   SIGNIFICANT EVENTS: 4/13 admission [place] 4/14 intubation, transfer to [facility]  LINES/TUBES: 4/14 ETT >   Labs from outside hospital reviewed showing normal renal function, normal CKMB, normal TSH, sodium low, ProBNP slightly elevated, troponin normal, CBC within normal limits  DISCUSSION: 56 y/o female with a past medical history of presumed COPD based on her smoking history who is here now with acute respiratory failure with hypoxemia secondary to influenza B pneumonia.    ASSESSMENT / PLAN:  PULMONARY A: Acute respiratory failure with hypoxemia Presumed COPD Tobacco abuse P:   Full vent support VAP prevention Check pro-BNP See ID Hold off on steroids for now Bronchodilators scheduled and prn  CARDIOVASCULAR A:  No acute issues P:  Tele Monitor blood pressure  RENAL A:   Hyponatremia (outside hospital labs) P:   Monitor BMET and UOP Replace electrolytes as needed   GASTROINTESTINAL A:   No acute issues P:   Pepcid for stress ulcer prophylaxis Tube feedings per protocol  HEMATOLOGIC A:   No acute issues P:  Check CBC Monitor for bleeding  INFECTIOUS A:   CAP, 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 Influenza B (by report, can't see lab result from outside hospital) P:   Repeat Influenza PCR now Pan culture Change levaquin to ceftriazone and azithro and vancomycin  ENDOCRINE A:   No acute issues P:   Monitor glucose  NEUROLOGIC A:   Sedation needs for vent synchrony P:   RASS goal: -2 PAD protocol: fentanyl gtt and prn versed    FAMILY  - Updates: Updated daughter bedside  - Inter-disciplinary  family meet or Palliative Care meeting due by:  day 7   My cc time 40 minutes  Roselie Awkward, MD [place] [facility] Pager: [phone] Cell: [phone] After 3pm or if no response, call [phone]   10/10/2016, 1:17 PM

## 2016-10-10 NOTE — Progress Notes (Signed)
Pharmacy Antibiotic Note  Rachel [name] is a 56 y.o. female admitted on 10/10/2016 with pneumonia.  Pharmacy has been consulted for vancomycin dosing. Patient transferred from [facility] with positive influenza B. Patient without prior labs in Epic system, current SCr 1.13. Normalized CrCl ~65 ml/min.  Plan: Vancomycin 2000 mg loading dose x1 Vancomycin 1250 mg IV q12h  Continue ceftriaxone 2g IV q24h Continue azithromycin 500 mg IV q24h Continue tamiflu 75 mg BID Monitor C&S, renal function and length of treatment Monitor VT at steady state as indicated     No data recorded.  No results for input(s): WBC, CREATININE, LATICACIDVEN, VANCOTROUGH, VANCOPEAK, VANCORANDOM, GENTTROUGH, GENTPEAK, GENTRANDOM, TOBRATROUGH, TOBRAPEAK, TOBRARND, AMIKACINPEAK, AMIKACINTROU, AMIKACIN in the last 168 hours.  CrCl cannot be calculated (No order found.).    Allergies  Allergen Reactions  . Oxycodone Nausea And Vomiting    Codeine, hydrocodone, most pain pills.  . Penicillins Itching and Nausea And Vomiting    Has patient had a PCN reaction causing immediate rash, facial/tongue/throat swelling, SOB or lightheadedness with hypotension: Yes, around the eyes Has patient had a PCN reaction causing severe rash involving mucus membranes or skin necrosis: No Has patient had a PCN reaction that required hospitalization: No Has patient had a PCN reaction occurring within the last 10 years: No  If all of the above answers are "NO", then may proceed with Cephalosporin use.     Antimicrobials this admission: Tamiflu 4/14 >> Ceftriaxone 4/14 >> Azithromycin 4/14 >> Vancomycin 4/14 >>  Microbiology results: 4/14 BCx:  4/14 UCx:  4/14 Trach asp:    Thank you for allowing pharmacy to be a part of this patient's care.  Dimitri Ped, PharmD, BCPS PGY-2 Infectious Diseases Pharmacy Resident Pager: [phone] 10/10/2016 1:38 PM

## 2016-10-11 ENCOUNTER — Inpatient Hospital Stay (HOSPITAL_COMMUNITY): Payer: BLUE CROSS/BLUE SHIELD

## 2016-10-11 DIAGNOSIS — N179 Acute kidney failure, unspecified: Secondary | ICD-10-CM

## 2016-10-11 DIAGNOSIS — Z452 Encounter for adjustment and management of vascular access device: Secondary | ICD-10-CM

## 2016-10-11 LAB — TROPONIN I: Troponin I: 0.13 ng/mL (ref <0.03)

## 2016-10-11 LAB — BASIC METABOLIC PANEL
ANION GAP: 11 (ref 5–15)
BUN: 35 mg/dL — ABNORMAL HIGH (ref 6–20)
CHLORIDE: 101 mmol/L (ref 101–111)
CO2: 23 mmol/L (ref 22–32)
Calcium: 8.4 mg/dL — ABNORMAL LOW (ref 8.9–10.3)
Creatinine, Ser: 1.81 mg/dL — ABNORMAL HIGH (ref 0.44–1.00)
GFR calc Af Amer: 35 mL/min — ABNORMAL LOW (ref >60)
GFR calc non Af Amer: 31 mL/min — ABNORMAL LOW (ref >60)
GLUCOSE: 133 mg/dL — AB (ref 65–99)
Potassium: 4.2 mmol/L (ref 3.5–5.1)
Sodium: 135 mmol/L (ref 135–145)

## 2016-10-11 LAB — GLUCOSE, CAPILLARY
GLUCOSE-CAPILLARY: 107 mg/dL — AB (ref 65–99)
GLUCOSE-CAPILLARY: 132 mg/dL — AB (ref 65–99)
GLUCOSE-CAPILLARY: 142 mg/dL — AB (ref 65–99)
GLUCOSE-CAPILLARY: 145 mg/dL — AB (ref 65–99)
Glucose-Capillary: 128 mg/dL — ABNORMAL HIGH (ref 65–99)
Glucose-Capillary: 115 mg/dL — ABNORMAL HIGH (ref 65–99)

## 2016-10-11 LAB — CBC
HEMATOCRIT: 33.8 % — AB (ref 36.0–46.0)
HEMOGLOBIN: 11 g/dL — AB (ref 12.0–15.0)
MCH: 28.5 pg (ref 26.0–34.0)
MCHC: 32.5 g/dL (ref 30.0–36.0)
MCV: 87.6 fL (ref 78.0–100.0)
Platelets: 286 10*3/uL (ref 150–400)
RBC: 3.86 MIL/uL — ABNORMAL LOW (ref 3.87–5.11)
RDW: 15 % (ref 11.5–15.5)
WBC: 15.5 10*3/uL — AB (ref 4.0–10.5)

## 2016-10-11 LAB — URINE CULTURE
Culture: NO GROWTH
Special Requests: NORMAL

## 2016-10-11 LAB — MAGNESIUM
MAGNESIUM: 2.3 mg/dL (ref 1.7–2.4)
Magnesium: 2.3 mg/dL (ref 1.7–2.4)

## 2016-10-11 LAB — PHOSPHORUS
Phosphorus: 4.1 mg/dL (ref 2.5–4.6)
Phosphorus: 4.3 mg/dL (ref 2.5–4.6)

## 2016-10-11 LAB — HIV ANTIBODY (ROUTINE TESTING W REFLEX): HIV SCREEN 4TH GENERATION: NONREACTIVE

## 2016-10-11 MED ORDER — OSELTAMIVIR PHOSPHATE 6 MG/ML PO SUSR
30.0000 mg | Freq: Two times a day (BID) | ORAL | Status: DC
  Administered 2016-10-11: 30 mg via ORAL
  Filled 2016-10-11 (×2): qty 12.5

## 2016-10-11 MED ORDER — SODIUM CHLORIDE 0.9 % IV SOLN
Freq: Once | INTRAVENOUS | Status: AC
  Administered 2016-10-11: 09:00:00 via INTRAVENOUS

## 2016-10-11 MED ORDER — ASPIRIN 81 MG PO CHEW
81.0000 mg | CHEWABLE_TABLET | Freq: Every day | ORAL | Status: DC
  Administered 2016-10-11 – 2016-10-22 (×11): 81 mg
  Filled 2016-10-11 (×11): qty 1

## 2016-10-11 MED ORDER — OSELTAMIVIR PHOSPHATE 6 MG/ML PO SUSR
75.0000 mg | Freq: Two times a day (BID) | ORAL | Status: DC
  Administered 2016-10-11: 75 mg via ORAL
  Filled 2016-10-11: qty 12.5

## 2016-10-11 MED ORDER — SODIUM CHLORIDE 0.9 % IV BOLUS (SEPSIS): 500.0000 mL | Freq: Once | INTRAVENOUS | Status: AC

## 2016-10-11 MED ORDER — FAMOTIDINE IN NACL 20-0.9 MG/50ML-% IV SOLN
20.0000 mg | INTRAVENOUS | Status: DC
  Administered 2016-10-12 – 2016-10-17 (×6): 20 mg via INTRAVENOUS
  Filled 2016-10-11 (×7): qty 50

## 2016-10-11 MED ORDER — SODIUM CHLORIDE 0.9 % IV SOLN
1250.0000 mg | INTRAVENOUS | Status: DC
  Administered 2016-10-12: 1250 mg via INTRAVENOUS
  Filled 2016-10-11: qty 1250

## 2016-10-11 MED ORDER — SODIUM CHLORIDE 0.9 % IV SOLN
INTRAVENOUS | Status: DC
  Administered 2016-10-11: 19:00:00 via INTRAVENOUS

## 2016-10-11 MED ORDER — ASPIRIN 81 MG PO CHEW
81.0000 mg | CHEWABLE_TABLET | Freq: Every day | ORAL | Status: DC
  Filled 2016-10-11: qty 1

## 2016-10-11 MED ORDER — SODIUM CHLORIDE 0.9 % IV SOLN
0.0000 ug/min | INTRAVENOUS | Status: DC
  Administered 2016-10-11: 20 ug/min via INTRAVENOUS
  Administered 2016-10-11: 50 ug/min via INTRAVENOUS
  Administered 2016-10-11: 40 ug/min via INTRAVENOUS
  Administered 2016-10-11: 30 ug/min via INTRAVENOUS
  Administered 2016-10-12: 50 ug/min via INTRAVENOUS
  Filled 2016-10-11 (×8): qty 1

## 2016-10-11 NOTE — Procedures (Signed)
Central Venous Catheter Insertion Procedure Note Rachel [name] [id number] 15-Sep-1960  Procedure: Insertion of Central Venous Catheter Indications: Assessment of intravascular volume, Drug and/or fluid administration and Frequent blood sampling  Procedure Details Consent: Risks of procedure as well as the alternatives and risks of each were explained to the (patient/caregiver).  Consent for procedure obtained. Time Out: Verified patient identification, verified procedure, site/side was marked, verified correct patient position, special equipment/implants available, medications/allergies/relevent history reviewed, required imaging and test results available.  Performed Real time [place] was used to ID and cannulate vessel  Maximum sterile technique was used including antiseptics, cap, gloves, gown, hand hygiene, mask and sheet. Skin prep: Chlorhexidine; local anesthetic administered A antimicrobial bonded/coated triple lumen catheter was placed in the left internal jugular vein using the Seldinger technique.  Evaluation Blood flow good Complications: No apparent complications Patient did tolerate procedure well. Chest X-ray ordered to verify placement.  CXR: pending.  Clementeen Graham 10/11/2016, 4:02 PM  Erick Colace ACNP-BC [place] Pager # [phone] OR # [phone] if no answer

## 2016-10-11 NOTE — Progress Notes (Signed)
PULMONARY / CRITICAL CARE MEDICINE   Name: Rachel [name]  MRN: [id number] DOB: 1961/06/25    ADMISSION DATE:  10/10/2016 CONSULTATION DATE:  10/11/2016   REFERRING MD:  [facility] from [place]:  Respiratory failure   BRIEF:  56 y/o female with emphysema admitted with Influenza B pneumonia/ARDS.   SUBJECTIVE:  Poor urine output Placed on vasopressors overnight Oxygen needs improving  VITAL SIGNS: BP (!) 104/59   Pulse 88   Temp 98.8 F (37.1 C) (Oral)   Resp 16   Ht 5\' 2"  (1.575 m)   Wt 91.8 kg (202 lb 6.1 oz)   SpO2 94%   BMI 37.02 kg/m   HEMODYNAMICS:    VENTILATOR SETTINGS: Vent Mode: PRVC FiO2 (%):  [60 %-100 %] 60 % Set Rate:  [16 bmp] 16 bmp Vt Set:  [530 mL] 530 mL PEEP:  [5 cmH20] 5 cmH20 Plateau Pressure:  [31 cmH20-43 cmH20] 43 cmH20  INTAKE / OUTPUT: I/O last 3 completed shifts: In: 1791.9 [I.V.:631.9; NG/GT:560; IV Piggyback:600] Out: 340 [Urine:340]  PHYSICAL EXAMINATION: General:  Sedated on vent HENT: NCAT ETT in place PULM: Crackles bilaterally, vent supported breathing CV: RRR, no mgr GI: BS+, soft, nontender MSK: normal bulk and tone Neuro: sedated on vent  LABS:  BMET  Recent Labs Lab 10/10/16 1507 10/11/16 0202  NA 135 135  K 4.3 4.2  CL 101 101  CO2 24 23  BUN 20 35*  CREATININE 1.13* 1.81*  GLUCOSE 163* 133*    Electrolytes  Recent Labs Lab 10/10/16 1507 10/10/16 2106 10/11/16 0202  CALCIUM 8.5*  --  8.4*  MG 2.1 2.2 2.3  PHOS 3.4 3.6 4.1    CBC  Recent Labs Lab 10/10/16 1507 10/11/16 0202  WBC 15.2* 15.5*  HGB 11.6* 11.0*  HCT 35.2* 33.8*  PLT 263 286    Coag's No results for input(s): APTT, INR in the last 168 hours.  Sepsis Markers  Recent Labs Lab 10/10/16 1507  LATICACIDVEN 1.3  PROCALCITON 0.31    ABG  Recent Labs Lab 10/10/16 1444  PHART 7.307*  PCO2ART 51.1*  PO2ART 113.0*    Liver Enzymes  Recent Labs Lab 10/10/16 1507  AST 47*  ALT  14  ALKPHOS 59  BILITOT 0.4  ALBUMIN 3.0*    Cardiac Enzymes  Recent Labs Lab 10/10/16 1507 10/10/16 2106 10/11/16 0202  TROPONINI 0.04* <0.03 0.13*    Glucose  Recent Labs Lab 10/10/16 1234 10/10/16 1557 10/10/16 2040 10/11/16 0045 10/11/16 0441 10/11/16 0757  GLUCAP 158* 179* 145* 142* 145* 128*    Imaging Portable Chest Xray  Result Date: 10/11/2016 CLINICAL DATA:  Acute chronic respiratory failure with hypoxemia. EXAM: PORTABLE CHEST 1 VIEW COMPARISON:  One-view chest x-ray 10/10/2016 FINDINGS: The heart size is normal. Endotracheal tube is in satisfactory position. NG tube courses off the inferior border of film. Diffuse interstitial and airspace disease is slightly improved with minimal increased aeration. IMPRESSION: 1. Slightly improved diffuse interstitial and airspace pattern suggesting edema. Infection is not excluded. 2. Support apparatus is stable. Electronically Signed   By: San Morelle M.D.   On: 10/11/2016 07:52   Dg Chest Port 1 View  Result Date: 10/10/2016 CLINICAL DATA:  56 year old female respiratory failure.  Influenza. EXAM: PORTABLE CHEST 1 VIEW COMPARISON:  [facility] 1044 hours today and earlier. FINDINGS: Portable AP semi upright view at 1416 hours. Endotracheal tube tip in good position between the level the clavicles and carina. Enteric tube courses to  the abdomen, side holes at the level of the gastric body. More lordotic positioning. Mildly lower lung volumes. Widespread coarse and confluent bilateral pulmonary opacity. Mediastinal contours are stable. No superimposed pneumothorax or pleural effusion identified. IMPRESSION: 1. Endotracheal tube tip now in good position. Enteric tube placed and side hole up the level of the gastric body. 2. Diffuse pulmonary interstitial and confluent opacity compatible with viral/atypical respiratory infection. No superimposed pneumothorax or pleural effusion identified. Electronically Signed    By: Genevie Ann M.D.   On: 10/10/2016 14:53     STUDIES:    CULTURES: 4/14 resp culture > GPC in pairs 4/14 urine culture 4/14 blood culture 4/14 influenza pcr panel > POSITIVE INFLUENZA B 4/14 urine strep > neg. 4/14 urine leg >   ANTIBIOTICS: 4/13 levaquin > 4/14 4/13 Tamiflu >  4/14 Vancomycin >  4/14 Ceftriaxone >  4/14 Azithromycin >   SIGNIFICANT EVENTS: 4/13 admission [place] 4/14 intubation, transfer to [facility]  LINES/TUBES: 4/14 ETT >   4/15 CXR images independently reviewed showing bilateral airspace disease, ETT in place  Labs from outside hospital reviewed showing normal renal function, normal CKMB, normal TSH, sodium low, ProBNP slightly elevated, troponin normal, CBC within normal limits  DISCUSSION: 56 y/o female with a past medical history of presumed COPD based on her smoking history who is here now with acute respiratory failure with hypoxemia secondary to influenza B pneumonia.    ASSESSMENT / PLAN:  PULMONARY A: Acute respiratory failure with hypoxemia Presumed COPD Tobacco abuse P:   Continue full vent support > increase PEEP, adjust PEEP, FiO2 to maintain SaO2 > 90% VAP prevention Bronchodilators scheduled and prn Goal CVP < 4 when not in shock  CARDIOVASCULAR A:  Demand ischemia Hypotension> suspect volume depleted P:  Tele Start ASA Check Echo Monitor blood pressure Place CVL today to measure CVP Continue neo for MAP > 65 Bolus fluids  RENAL A:   AKI, oliguric P:   Monitor BMET and UOP Replace electrolytes as needed Bolus 500cc saline now  GASTROINTESTINAL A:   No acute issues P:   Pepcid for stress ulcer prophylaxis Tube feedings per protocol  HEMATOLOGIC A:   No acute issues P:  Check CBC Monitor for bleeding  INFECTIOUS A:   CAP, 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 Influenza B (by report, can't see lab result from outside hospital) P:   Continue tamiflu F/U  culture > may be able to narrow antibitoics Change levaquin to ceftriazone and azithro and vancomycin  ENDOCRINE A:   No acute issues P:   Monitor glucose  NEUROLOGIC A:   Sedation needs for vent synchrony P:   RASS goal: -2 PAD protocol: fentanyl gtt and prn versed    FAMILY  - Updates: Updated daughter bedside 4/14  - Inter-disciplinary family meet or Palliative Care meeting due by:  day 7   My cc time 34 minutes  Roselie Awkward, MD [place] [facility] Pager: [phone] Cell: [phone] After 3pm or if no response, call [phone]   10/11/2016, 8:23 AM

## 2016-10-11 NOTE — Progress Notes (Signed)
[facility] Progress Note Patient Name: Rachel [name] DOB: 08-20-1960 MRN: [id number]   Date of Service  10/11/2016  HPI/Events of Note  Hypotension - Likely d/t need for sedation. Currently on Norepinephrine via a PIV.   eICU Interventions  Will order: 1. Phenylephrine IV infusion. Titrate to MAP >= 65. 2. Wean Norepinephrine IV infusion off when Phenylephring IV infusion if up and running.      Intervention Category Major Interventions: Hypotension - evaluation and management  Abbygayle Helfand [place] 10/11/2016, 2:20 AM

## 2016-10-11 NOTE — Progress Notes (Addendum)
Pharmacy Antibiotic Note  Rachel [name] is a 56 y.o. female admitted on 10/10/2016 with pneumonia.  Pharmacy has been consulted for vancomycin dosing. Patient transferred from [facility] with positive influenza B. Patient without prior labs in Epic system, SCr continues to rise. Today SCr is 1.81, estimated CrCl ~38 ml/min.  Plan: Decrease Vancomycin 1250 mg IV q24h  Continue ceftriaxone 2g IV q24h Continue azithromycin 500 mg IV q24h Decrease tamiflu 30 mg BID Monitor C&S, renal function and length of treatment Monitor VT at steady state as indicated  Height: 5\' 2"  (157.5 cm) Weight: 202 lb 6.1 oz (91.8 kg) IBW/kg (Calculated) : 50.1  Temp (24hrs), Avg:98.3 F (36.8 C), Min:97.2 F (36.2 C), Max:99 F (37.2 C)   Recent Labs Lab 10/10/16 1507 10/11/16 0202  WBC 15.2* 15.5*  CREATININE 1.13* 1.81*  LATICACIDVEN 1.3  --     Estimated Creatinine Clearance: 37.5 mL/min (A) (by C-G formula based on SCr of 1.81 mg/dL (H)).    Allergies  Allergen Reactions  . Oxycodone Nausea And Vomiting    Codeine, hydrocodone, most pain pills.  . Penicillins Itching and Nausea And Vomiting    Has patient had a PCN reaction causing immediate rash, facial/tongue/throat swelling, SOB or lightheadedness with hypotension: Yes, around the eyes Has patient had a PCN reaction causing severe rash involving mucus membranes or skin necrosis: No Has patient had a PCN reaction that required hospitalization: No Has patient had a PCN reaction occurring within the last 10 years: No  If all of the above answers are "NO", then may proceed with Cephalosporin use.     Antimicrobials this admission: Levofloxacin ([place]) 4/13 >> 4/14 Tamiflu 4/14 >> Ceftriaxone 4/14 >> Azithromycin 4/14 >> Vancomycin 4/14 >>  Microbiology results: 4/14 Influenza B: positive 4/14 HIV: negative 4/14 BCx:  4/14 UCx: no growth 4/14 Trach asp: rare GPC in pairs, too young to read 4/14 MRSA PCR:  negative  Thank you for allowing pharmacy to be a part of this patient's care.  Dimitri Ped, PharmD, BCPS PGY-2 Infectious Diseases Pharmacy Resident Pager: [phone] 10/11/2016 11:19 AM

## 2016-10-12 ENCOUNTER — Inpatient Hospital Stay (HOSPITAL_COMMUNITY): Payer: BLUE CROSS/BLUE SHIELD

## 2016-10-12 DIAGNOSIS — Z4659 Encounter for fitting and adjustment of other gastrointestinal appliance and device: Secondary | ICD-10-CM

## 2016-10-12 DIAGNOSIS — J80 Acute respiratory distress syndrome: Secondary | ICD-10-CM | POA: Diagnosis present

## 2016-10-12 DIAGNOSIS — J9621 Acute and chronic respiratory failure with hypoxia: Secondary | ICD-10-CM | POA: Diagnosis present

## 2016-10-12 DIAGNOSIS — J9600 Acute respiratory failure, unspecified whether with hypoxia or hypercapnia: Secondary | ICD-10-CM

## 2016-10-12 LAB — BASIC METABOLIC PANEL
Anion gap: 12 (ref 5–15)
BUN: 65 mg/dL — ABNORMAL HIGH (ref 6–20)
CALCIUM: 8 mg/dL — AB (ref 8.9–10.3)
CO2: 21 mmol/L — AB (ref 22–32)
CREATININE: 3.63 mg/dL — AB (ref 0.44–1.00)
Chloride: 103 mmol/L (ref 101–111)
GFR, EST AFRICAN AMERICAN: 15 mL/min — AB (ref >60)
GFR, EST NON AFRICAN AMERICAN: 13 mL/min — AB (ref >60)
Glucose, Bld: 112 mg/dL — ABNORMAL HIGH (ref 65–99)
Potassium: 4.5 mmol/L (ref 3.5–5.1)
SODIUM: 136 mmol/L (ref 135–145)

## 2016-10-12 LAB — POCT I-STAT 3, ART BLOOD GAS (G3+)
ACID-BASE DEFICIT: 6 mmol/L — AB (ref 0.0–2.0)
ACID-BASE DEFICIT: 8 mmol/L — AB (ref 0.0–2.0)
ACID-BASE DEFICIT: 5 mmol/L — AB (ref 0.0–2.0)
Acid-base deficit: 5 mmol/L — ABNORMAL HIGH (ref 0.0–2.0)
Acid-base deficit: 6 mmol/L — ABNORMAL HIGH (ref 0.0–2.0)
BICARBONATE: 19.9 mmol/L — AB (ref 20.0–28.0)
BICARBONATE: 18.7 mmol/L — AB (ref 20.0–28.0)
BICARBONATE: 21.8 mmol/L (ref 20.0–28.0)
Bicarbonate: 22 mmol/L (ref 20.0–28.0)
Bicarbonate: 21.6 mmol/L (ref 20.0–28.0)
O2 SAT: 95 %
O2 SAT: 93 %
O2 SAT: 86 %
O2 Saturation: 91 %
O2 Saturation: 92 %
PCO2 ART: 51.1 mmHg — AB (ref 32.0–48.0)
PCO2 ART: 48.2 mmHg — AB (ref 32.0–48.0)
PH ART: 7.229 — AB (ref 7.350–7.450)
PH ART: 7.258 — AB (ref 7.350–7.450)
PO2 ART: 87 mmHg (ref 83.0–108.0)
PO2 ART: 73 mmHg — AB (ref 83.0–108.0)
Patient temperature: 100.2
Patient temperature: 98.7
Patient temperature: 98.6
TCO2: 21 mmol/L (ref 0–100)
TCO2: 20 mmol/L (ref 0–100)
TCO2: 23 mmol/L (ref 0–100)
TCO2: 23 mmol/L (ref 0–100)
TCO2: 23 mmol/L (ref 0–100)
pCO2 arterial: 40.7 mmHg (ref 32.0–48.0)
pCO2 arterial: 45.2 mmHg (ref 32.0–48.0)
pCO2 arterial: 49.2 mmHg — ABNORMAL HIGH (ref 32.0–48.0)
pH, Arterial: 7.301 — ABNORMAL LOW (ref 7.350–7.450)
pH, Arterial: 7.239 — ABNORMAL LOW (ref 7.350–7.450)
pH, Arterial: 7.26 — ABNORMAL LOW (ref 7.350–7.450)
pO2, Arterial: 76 mmHg — ABNORMAL LOW (ref 83.0–108.0)
pO2, Arterial: 81 mmHg — ABNORMAL LOW (ref 83.0–108.0)
pO2, Arterial: 60 mmHg — ABNORMAL LOW (ref 83.0–108.0)

## 2016-10-12 LAB — URINALYSIS, ROUTINE W REFLEX MICROSCOPIC
Bilirubin Urine: NEGATIVE
Glucose, UA: 50 mg/dL — AB
Ketones, ur: NEGATIVE mg/dL
Nitrite: NEGATIVE
Protein, ur: 100 mg/dL — AB
SPECIFIC GRAVITY, URINE: 1.017 (ref 1.005–1.030)
SQUAMOUS EPITHELIAL / LPF: NONE SEEN
pH: 5 (ref 5.0–8.0)

## 2016-10-12 LAB — CBC WITH DIFFERENTIAL/PLATELET
BASOS ABS: 0 10*3/uL (ref 0.0–0.1)
BASOS PCT: 0 %
EOS ABS: 0 10*3/uL (ref 0.0–0.7)
Eosinophils Relative: 0 %
HCT: 28.1 % — ABNORMAL LOW (ref 36.0–46.0)
HEMOGLOBIN: 8.9 g/dL — AB (ref 12.0–15.0)
Lymphocytes Relative: 10 %
Lymphs Abs: 1 10*3/uL (ref 0.7–4.0)
MCH: 27.7 pg (ref 26.0–34.0)
MCHC: 31.7 g/dL (ref 30.0–36.0)
MCV: 87.5 fL (ref 78.0–100.0)
MONOS PCT: 4 %
Monocytes Absolute: 0.4 10*3/uL (ref 0.1–1.0)
NEUTROS PCT: 86 %
Neutro Abs: 8.6 10*3/uL — ABNORMAL HIGH (ref 1.7–7.7)
Platelets: 273 10*3/uL (ref 150–400)
RBC: 3.21 MIL/uL — ABNORMAL LOW (ref 3.87–5.11)
RDW: 15.1 % (ref 11.5–15.5)
WBC: 10 10*3/uL (ref 4.0–10.5)

## 2016-10-12 LAB — GLUCOSE, CAPILLARY
GLUCOSE-CAPILLARY: 117 mg/dL — AB (ref 65–99)
GLUCOSE-CAPILLARY: 139 mg/dL — AB (ref 65–99)
Glucose-Capillary: 107 mg/dL — ABNORMAL HIGH (ref 65–99)
Glucose-Capillary: 112 mg/dL — ABNORMAL HIGH (ref 65–99)
Glucose-Capillary: 113 mg/dL — ABNORMAL HIGH (ref 65–99)
Glucose-Capillary: 118 mg/dL — ABNORMAL HIGH (ref 65–99)
Glucose-Capillary: 119 mg/dL — ABNORMAL HIGH (ref 65–99)

## 2016-10-12 LAB — BLOOD GAS, ARTERIAL
ACID-BASE DEFICIT: 6 mmol/L — AB (ref 0.0–2.0)
Acid-base deficit: 5.5 mmol/L — ABNORMAL HIGH (ref 0.0–2.0)
BICARBONATE: 21.3 mmol/L (ref 20.0–28.0)
BICARBONATE: 20.2 mmol/L (ref 20.0–28.0)
Drawn by: 330991
Drawn by: 330991
FIO2: 50
FIO2: 50
LHR: 22 {breaths}/min
LHR: 30 {breaths}/min
MECHVT: 400 mL
MECHVT: 400 mL
O2 Saturation: 91.2 %
O2 Saturation: 94.8 %
PATIENT TEMPERATURE: 101.5
PEEP/CPAP: 8 cmH2O
PEEP/CPAP: 8 cmH2O
Patient temperature: 99.5
pCO2 arterial: 62.5 mmHg — ABNORMAL HIGH (ref 32.0–48.0)
pCO2 arterial: 49.1 mmHg — ABNORMAL HIGH (ref 32.0–48.0)
pH, Arterial: 7.161 — CL (ref 7.350–7.450)
pH, Arterial: 7.249 — ABNORMAL LOW (ref 7.350–7.450)
pO2, Arterial: 77.3 mmHg — ABNORMAL LOW (ref 83.0–108.0)
pO2, Arterial: 89.4 mmHg (ref 83.0–108.0)

## 2016-10-12 LAB — ECHOCARDIOGRAM COMPLETE
HEIGHTINCHES: 62 in
WEIGHTICAEL: 3238.12 [oz_av]

## 2016-10-12 LAB — CREATININE, URINE, RANDOM: CREATININE, URINE: 197.02 mg/dL

## 2016-10-12 LAB — SODIUM, URINE, RANDOM: SODIUM UR: 37 mmol/L

## 2016-10-12 MED ORDER — VITAL HIGH PROTEIN PO LIQD
1000.0000 mL | ORAL | Status: DC
  Administered 2016-10-12 – 2016-10-16 (×7): 1000 mL
  Administered 2016-10-16 (×2)

## 2016-10-12 MED ORDER — FUROSEMIDE 10 MG/ML IJ SOLN
120.0000 mg | Freq: Two times a day (BID) | INTRAVENOUS | Status: DC
  Administered 2016-10-12 – 2016-10-13 (×2): 120 mg via INTRAVENOUS
  Filled 2016-10-12 (×2): qty 12

## 2016-10-12 MED ORDER — MIDAZOLAM HCL 2 MG/2ML IJ SOLN
1.0000 mg | Freq: Once | INTRAMUSCULAR | Status: AC
  Administered 2016-10-12: 1 mg via INTRAVENOUS
  Filled 2016-10-12: qty 2

## 2016-10-12 MED ORDER — SODIUM BICARBONATE 8.4 % IV SOLN
INTRAVENOUS | Status: DC
  Administered 2016-10-12: 12:00:00 via INTRAVENOUS
  Filled 2016-10-12 (×3): qty 150

## 2016-10-12 MED ORDER — HEPARIN SODIUM (PORCINE) 5000 UNIT/ML IJ SOLN
5000.0000 [IU] | Freq: Three times a day (TID) | INTRAMUSCULAR | Status: DC
  Administered 2016-10-12 – 2016-10-23 (×33): 5000 [IU] via SUBCUTANEOUS
  Filled 2016-10-12 (×35): qty 1

## 2016-10-12 MED ORDER — OSELTAMIVIR PHOSPHATE 6 MG/ML PO SUSR
30.0000 mg | Freq: Every day | ORAL | Status: AC
Start: 2016-10-12 — End: 2016-10-14
  Administered 2016-10-12 – 2016-10-14 (×3): 30 mg via ORAL
  Filled 2016-10-12 (×3): qty 12.5

## 2016-10-12 MED ORDER — CLONAZEPAM 1 MG PO TABS
1.0000 mg | ORAL_TABLET | Freq: Three times a day (TID) | ORAL | Status: DC | PRN
  Administered 2016-10-13 – 2016-10-16 (×7): 1 mg via ORAL
  Filled 2016-10-12 (×7): qty 1

## 2016-10-12 MED ORDER — VITAL HIGH PROTEIN PO LIQD: 1000.0000 mL | ORAL | Status: DC

## 2016-10-12 MED ORDER — ENOXAPARIN SODIUM 30 MG/0.3ML ~~LOC~~ SOLN: 30.0000 mg | SUBCUTANEOUS | Status: DC

## 2016-10-12 MED ORDER — FLUOXETINE HCL 20 MG PO CAPS
20.0000 mg | ORAL_CAPSULE | Freq: Every day | ORAL | Status: DC
  Administered 2016-10-12 – 2016-10-13 (×3): 20 mg via ORAL
  Filled 2016-10-12 (×4): qty 1

## 2016-10-12 MED ORDER — FUROSEMIDE 10 MG/ML IJ SOLN
60.0000 mg | Freq: Two times a day (BID) | INTRAMUSCULAR | Status: DC
  Administered 2016-10-12: 60 mg via INTRAVENOUS
  Filled 2016-10-12 (×2): qty 6

## 2016-10-12 MED FILL — Fentanyl Citrate Preservative Free (PF) Inj 100 MCG/2ML: INTRAMUSCULAR | Qty: 4 | Status: AC

## 2016-10-12 NOTE — Progress Notes (Signed)
Initial Nutrition Assessment  DOCUMENTATION CODES:   Obesity unspecified  INTERVENTION:    Vital High Protein at 50 ml/h (1200 ml per day)   D/C Prostat   Provides 1200 kcal, 105 gm protein, 1003 ml free water daily  NUTRITION DIAGNOSIS:   Inadequate oral intake related to inability to eat as evidenced by NPO status.  GOAL:   Provide needs based on ASPEN/SCCM guidelines  MONITOR:   Vent status, TF tolerance, Labs, I & O's  REASON FOR ASSESSMENT:   Consult Enteral/tube feeding initiation and management  ASSESSMENT:   56 y/o female with emphysema admitted with Influenza B pneumonia/ARDS.  Discussed patient in ICU rounds and with RN today. Patient is currently intubated on ventilator support MV: 12.7 L/min Temp (24hrs), Avg:99.5 F (37.5 C), Min:98.4 F (36.9 C), Max:100.5 F (38.1 C)   Nutrition-Focused physical exam completed. Findings are no fat depletion, no muscle depletion, and mild edema.   Patient is currently receiving Vital High Protein via OGT at 40 ml/h (960 ml/day) with Prostat 30 ml BID to provide 1160 kcals, 114 gm protein, 803 ml free water daily. Tolerating well.  Labs reviewed. Medications reviewed and include Lasix.  Diet Order:  Diet NPO time specified  Skin:  Reviewed, no issues  Last BM:  unknown  Height:   Ht Readings from Last 1 Encounters:  10/10/16 5\' 2"  (1.575 m)    Weight:   Wt Readings from Last 1 Encounters:  10/11/16 202 lb 6.1 oz (91.8 kg)    Ideal Body Weight:  50 kg  BMI:  Body mass index is 37.02 kg/m.  Estimated Nutritional Needs:   Kcal:  6945-0388  Protein:  100 gm  Fluid:  1.5 L  EDUCATION NEEDS:   No education needs identified at this time  Molli Barrows, [place], [place], [place] Pager [phone] After Hours Pager [phone]

## 2016-10-12 NOTE — Plan of Care (Signed)
Problem: Activity: Goal: Ability to tolerate increased activity will improve Outcome: Progressing Pt tolerates HOB elevated and activity with turns.   Problem: Nutritional: Goal: Intake of prescribed amount of daily calories will improve Outcome: Progressing Pt tolerating ordered tube feeds. Will continue to monitor nutritional status.   Problem: Skin Integrity Impairment Risk: Goal: Risk for impaired skin integrity will decrease Outcome: Progressing No further breakdown noted. Pt turned and repositioned q2h. Will continue to monitor skin integrity.   Problem: Pain Managment: Goal: General experience of comfort will improve Outcome: Progressing Pt comfortable throughout shift with Fentanyl gtt. No s/s of pain or discomfort noted. VSS, will continue to monitor for pain.

## 2016-10-12 NOTE — Progress Notes (Signed)
  Echocardiogram 2D Echocardiogram has been performed.  Rachel [name] 10/12/2016, 3:03 PM

## 2016-10-12 NOTE — Progress Notes (Signed)
Pts daughter called this afternoon with a request that she would like for her mother to transferred to [facility]. Pts daughter expressed concern that she felt like her mother wasn't making any progress today with her breathing or her kidneys. I explained to the pts daughter that the pt had a severe case of the flu and pneumonia and it would take time for her lungs to heal but that we were currently treating her with antibiotics and supporting her breathing. I also explained to the pts daughter that while her mothers kidney function had increased today that we were challenging her kidneys with Lasix and are continuing to closely monitor her mothers kidney function.  I told the pts daughter I would speak to the doctor about her concerns.  This information was relayed to Dr. Titus Mould and the CCM Resident Dr Genene Churn.

## 2016-10-12 NOTE — Progress Notes (Signed)
PULMONARY / CRITICAL CARE MEDICINE   Name: Rachel [name]  MRN: [id number] DOB: May 23, 1961    ADMISSION DATE:  10/10/2016 CONSULTATION DATE:  10/12/2016   REFERRING MD:  Alean Rinne from [place]:  Respiratory failure   BRIEF:  56 y/o female with emphysema admitted with Influenza B pneumonia/ARDS.   SUBJECTIVE:  Poor urine output Placed on vasopressors overnight Oxygen needs improving  VITAL SIGNS: BP 117/67   Pulse 87   Temp 100.1 F (37.8 C) (Oral)   Resp (!) 29   Ht 5\' 2"  (1.575 m)   Wt 202 lb 6.1 oz (91.8 kg)   SpO2 98%   BMI 37.02 kg/m   HEMODYNAMICS: CVP:  [15 mmHg] 15 mmHg  VENTILATOR SETTINGS: Vent Mode: PRVC FiO2 (%):  [40 %-60 %] 50 % Set Rate:  [16 bmp-35 bmp] 35 bmp Vt Set:  [400 mL-530 mL] 400 mL PEEP:  [5 cmH20-8 cmH20] 8 cmH20 Plateau Pressure:  [33 cmH20-43 cmH20] 33 cmH20  INTAKE / OUTPUT: I/O last 3 completed shifts: In: 5574.4 [I.V.:3904.4; NG/GT:1320; IV Piggyback:350] Out: 455 [Urine:455]  PHYSICAL EXAMINATION: General:  Sedated on vent HENT: NCAT ETT in place PULM: Crackles bilaterally, vent supported breathing CV: RRR, no mgr GI: BS+, soft, nontender MSK: normal bulk and tone Neuro: sedated on vent  LABS:  BMET  Recent Labs Lab 10/10/16 1507 10/11/16 0202 10/12/16 0420  NA 135 135 136  K 4.3 4.2 4.5  CL 101 101 103  CO2 24 23 21*  BUN 20 35* 65*  CREATININE 1.13* 1.81* 3.63*  GLUCOSE 163* 133* 112*    Electrolytes  Recent Labs Lab 10/10/16 1507 10/10/16 2106 10/11/16 0202 10/11/16 1650 10/12/16 0420  CALCIUM 8.5*  --  8.4*  --  8.0*  MG 2.1 2.2 2.3 2.3  --   PHOS 3.4 3.6 4.1 4.3  --     CBC  Recent Labs Lab 10/10/16 1507 10/11/16 0202 10/12/16 0420  WBC 15.2* 15.5* 10.0  HGB 11.6* 11.0* 8.9*  HCT 35.2* 33.8* 28.1*  PLT 263 286 273    Coag's No results for input(s): APTT, INR in the last 168 hours.  Sepsis Markers  Recent Labs Lab 10/10/16 1507  LATICACIDVEN  1.3  PROCALCITON 0.31    ABG  Recent Labs Lab 10/10/16 1444 10/12/16 0355 10/12/16 0525  PHART 7.307* 7.161* 7.249*  PCO2ART 51.1* 62.5* 49.1*  PO2ART 113.0* 77.3* 89.4    Liver Enzymes  Recent Labs Lab 10/10/16 1507  AST 47*  ALT 14  ALKPHOS 59  BILITOT 0.4  ALBUMIN 3.0*    Cardiac Enzymes  Recent Labs Lab 10/10/16 1507 10/10/16 2106 10/11/16 0202  TROPONINI 0.04* <0.03 0.13*    Glucose  Recent Labs Lab 10/11/16 0757 10/11/16 1212 10/11/16 1623 10/11/16 2004 10/12/16 0024 10/12/16 0410  GLUCAP 128* 132* 107* 115* 112* 117*    Imaging Dg Chest Port 1 View  Result Date: 10/12/2016 CLINICAL DATA:  Acute on chronic respiratory failure with hypoxemia. EXAM: PORTABLE CHEST 1 VIEW COMPARISON:  10/11/2016 FINDINGS: Endotracheal tube is present with tip measuring 4.6 cm above the carina. An enteric tube is present. The tip is in the mid esophagus with proximal side hole at the thoracic inlet. The should be advanced for placement into the stomach. Left central venous catheter with tip over the lower SVC region. No pneumothorax. Normal heart size. Shallow inspiration. Right perihilar infiltration or consolidation. Atelectasis or infiltration in the left lung base. No blunting of costophrenic angles. No pneumothorax.  IMPRESSION: The enteric tube is pulled back with tip now in the mid esophagus. This should be advanced for placement into the stomach. Right perihilar and left basilar infiltration or consolidation. Electronically Signed   By: Lucienne Capers M.D.   On: 10/12/2016 01:38   Dg Chest Port 1 View  Result Date: 10/11/2016 CLINICAL DATA:  Central line placement. EXAM: PORTABLE CHEST 1 VIEW COMPARISON:  Earlier today at 0525 hours. FINDINGS: 1615 hours. Endotracheal tube terminates 4.4 cm above carina.Left internal jugular line tip at low SVC. Nasogastric tube terminates at the proximal stomach with side port above the gastroesophageal junction. Midline trachea.  Normal heart size. Right hemidiaphragm elevation. No pleural effusion or pneumothorax. Low lung volumes. Slight improvement in bilateral interstitial and airspace disease, most confluent in the right upper lobe. IMPRESSION: Left internal jugular line tip at low SVC, without pneumothorax. Improvement in probable pulmonary edema. Multifocal infection could look similar. Nasogastric tube with side port above the gastroesophageal junction. Consider advancement. Electronically Signed   By: Abigail Miyamoto M.D.   On: 10/11/2016 16:56     STUDIES:  CXR 4/16: Right perihilar and left basilar infiltration or consolidation.  CULTURES: 4/14 resp culture > GPC in pairs MRSA nasal swab neg. 4/14 urine culture >>negative. 4/14 blood culture  4/14 influenza pcr panel > POSITIVE INFLUENZA B 4/14 urine strep > neg. 4/14 urine leg >   ANTIBIOTICS: 4/13 levaquin > 4/14 4/13 Tamiflu >  4/14 Vancomycin >  4/14 Ceftriaxone >  4/14 Azithromycin >   SIGNIFICANT EVENTS: 4/13 admission [facility] 4/14 intubation, transfer to [facility] 4/15 - remains intubated  LINES/TUBES: 4/14 ETT >   4/15 CXR images independently reviewed showing bilateral airspace disease, ETT in place  Labs from outside hospital reviewed showing normal renal function, normal CKMB, normal TSH, sodium low, ProBNP slightly elevated, troponin normal, CBC within normal limits  DISCUSSION: 56 y/o female with a past medical history of presumed COPD based on her smoking history who is here now with acute respiratory failure with hypoxemia secondary to influenza B pneumonia.    ASSESSMENT / PLAN:  PULMONARY A: Acute respiratory failure with hypoxemia from PNA, likely developing ARDS Presumed COPD Tobacco abuse P:   Continue full vent support Bronchodilators scheduled and prn Goal CVP < 4 when not in shock Lung protective ventillation Daily WUA and SBT  CARDIOVASCULAR A:  Demand ischemia Septic shock with Hypotension requiring  pressor P:  Tele Start ASA Check Echo Monitor blood pressure, monitor CVP. Continue neo for MAP > 65  RENAL A:   AKI, oliguric likely from septic shock On presentation crt 1.13, now 3.63.  P:   Monitor BMET and UOP Possible vanc toxicity on top of septic shock, hold vanc, check levels, f/up pharmacy recs.   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   10/12/2016, 7:43 AM

## 2016-10-12 NOTE — Progress Notes (Signed)
PHARMACY NOTE:  ANTIMICROBIAL RENAL DOSAGE ADJUSTMENT  Current antimicrobial regimen includes a mismatch between antimicrobial dosage and estimated renal function.  As per policy approved by the Pharmacy & Therapeutics and Medical Executive Committees, the antimicrobial dosage will be adjusted accordingly.  Current antimicrobial dosage:  Tamiflu 30mg  BID  Indication: Flu  Renal Function:  Estimated Creatinine Clearance: 18.7 mL/min (A) (by C-G formula based on SCr of 3.63 mg/dL (H)). []      On intermittent HD, scheduled: []      On CRRT    Antimicrobial dosage has been changed to:  Tamiflu 30mg  daily  Additional comments:    Rebecka Apley, [facility] 10/12/2016 7:51 AM

## 2016-10-12 NOTE — Progress Notes (Signed)
Verbal order by MD giving RT permission to change vent settings as needed per ABG results. RR increased to 35 per last ABG. Will recheck ABG again in one hour and reassess. RT will continue to monitor.

## 2016-10-12 NOTE — Progress Notes (Signed)
Spoke with Dr. Posey Pronto regarding vent settings of 530/16/+8/50%. Set Vt was around 10cc/kg, and pt continues to have PIP's in the 50's. Pt became very tachypneic with RR in upper 40's. Vent settings changed to Vt of  400(8cc/kg), and RR increased to 22 to match previous mve. Pt tolerating vent changes at this time. RT will check ABG in about an hour post vent changes.

## 2016-10-12 NOTE — Progress Notes (Signed)
Critical ABG results given to RN and MD. Per ABG results, RR increased to 30. RT will recheck ABG in one hour. RT will continue to monitor.

## 2016-10-13 ENCOUNTER — Inpatient Hospital Stay (HOSPITAL_COMMUNITY): Payer: BLUE CROSS/BLUE SHIELD

## 2016-10-13 ENCOUNTER — Encounter (HOSPITAL_COMMUNITY): Payer: Self-pay | Admitting: General Practice

## 2016-10-13 DIAGNOSIS — J80 Acute respiratory distress syndrome: Secondary | ICD-10-CM

## 2016-10-13 LAB — POCT I-STAT 3, ART BLOOD GAS (G3+)
Acid-base deficit: 1 mmol/L (ref 0.0–2.0)
Acid-base deficit: 3 mmol/L — ABNORMAL HIGH (ref 0.0–2.0)
Bicarbonate: 21.3 mmol/L (ref 20.0–28.0)
Bicarbonate: 22.5 mmol/L (ref 20.0–28.0)
O2 SAT: 96 %
O2 SAT: 99 %
PH ART: 7.504 — AB (ref 7.350–7.450)
Patient temperature: 98.6
TCO2: 22 mmol/L (ref 0–100)
TCO2: 24 mmol/L (ref 0–100)
pCO2 arterial: 27.1 mmHg — ABNORMAL LOW (ref 32.0–48.0)
pCO2 arterial: 42 mmHg (ref 32.0–48.0)
pH, Arterial: 7.337 — ABNORMAL LOW (ref 7.350–7.450)
pO2, Arterial: 140 mmHg — ABNORMAL HIGH (ref 83.0–108.0)
pO2, Arterial: 87 mmHg (ref 83.0–108.0)

## 2016-10-13 LAB — MAGNESIUM
MAGNESIUM: 2.7 mg/dL — AB (ref 1.7–2.4)
Magnesium: 2.5 mg/dL — ABNORMAL HIGH (ref 1.7–2.4)

## 2016-10-13 LAB — BASIC METABOLIC PANEL
ANION GAP: 11 (ref 5–15)
Anion gap: 12 (ref 5–15)
BUN: 83 mg/dL — ABNORMAL HIGH (ref 6–20)
BUN: 90 mg/dL — AB (ref 6–20)
CALCIUM: 7.9 mg/dL — AB (ref 8.9–10.3)
CHLORIDE: 104 mmol/L (ref 101–111)
CO2: 22 mmol/L (ref 22–32)
CO2: 23 mmol/L (ref 22–32)
CREATININE: 4.04 mg/dL — AB (ref 0.44–1.00)
CREATININE: 3.83 mg/dL — AB (ref 0.44–1.00)
Calcium: 7.9 mg/dL — ABNORMAL LOW (ref 8.9–10.3)
Chloride: 104 mmol/L (ref 101–111)
GFR calc Af Amer: 13 mL/min — ABNORMAL LOW (ref >60)
GFR calc Af Amer: 14 mL/min — ABNORMAL LOW (ref >60)
GFR calc non Af Amer: 12 mL/min — ABNORMAL LOW (ref >60)
GFR, EST NON AFRICAN AMERICAN: 12 mL/min — AB (ref >60)
GLUCOSE: 129 mg/dL — AB (ref 65–99)
GLUCOSE: 179 mg/dL — AB (ref 65–99)
Potassium: 4.1 mmol/L (ref 3.5–5.1)
Potassium: 4.4 mmol/L (ref 3.5–5.1)
Sodium: 137 mmol/L (ref 135–145)
Sodium: 139 mmol/L (ref 135–145)

## 2016-10-13 LAB — GLUCOSE, CAPILLARY
GLUCOSE-CAPILLARY: 124 mg/dL — AB (ref 65–99)
GLUCOSE-CAPILLARY: 135 mg/dL — AB (ref 65–99)
Glucose-Capillary: 132 mg/dL — ABNORMAL HIGH (ref 65–99)
Glucose-Capillary: 157 mg/dL — ABNORMAL HIGH (ref 65–99)
Glucose-Capillary: 160 mg/dL — ABNORMAL HIGH (ref 65–99)

## 2016-10-13 LAB — VANCOMYCIN, RANDOM: VANCOMYCIN RM: 37

## 2016-10-13 LAB — CBC
HCT: 21.9 % — ABNORMAL LOW (ref 36.0–46.0)
HEMOGLOBIN: 7.3 g/dL — AB (ref 12.0–15.0)
MCH: 28 pg (ref 26.0–34.0)
MCHC: 33.3 g/dL (ref 30.0–36.0)
MCV: 83.9 fL (ref 78.0–100.0)
PLATELETS: 256 10*3/uL (ref 150–400)
RBC: 2.61 MIL/uL — ABNORMAL LOW (ref 3.87–5.11)
RDW: 14.8 % (ref 11.5–15.5)
WBC: 8.7 10*3/uL (ref 4.0–10.5)

## 2016-10-13 LAB — LEGIONELLA PNEUMOPHILA SEROGP 1 UR AG: L. PNEUMOPHILA SEROGP 1 UR AG: NEGATIVE

## 2016-10-13 LAB — CULTURE, RESPIRATORY W GRAM STAIN: Culture: NORMAL

## 2016-10-13 LAB — CULTURE, RESPIRATORY: SPECIAL REQUESTS: NORMAL

## 2016-10-13 LAB — PHOSPHORUS
PHOSPHORUS: 5.3 mg/dL — AB (ref 2.5–4.6)
Phosphorus: 5.8 mg/dL — ABNORMAL HIGH (ref 2.5–4.6)

## 2016-10-13 MED ORDER — ORAL CARE MOUTH RINSE
15.0000 mL | OROMUCOSAL | Status: DC
  Administered 2016-10-13 – 2016-10-21 (×83): 15 mL via OROMUCOSAL

## 2016-10-13 MED ORDER — POLYETHYLENE GLYCOL 3350 17 G PO PACK
17.0000 g | PACK | Freq: Every day | ORAL | Status: DC
  Administered 2016-10-13 – 2016-10-19 (×5): 17 g
  Filled 2016-10-13 (×11): qty 1

## 2016-10-13 MED ORDER — METOPROLOL TARTRATE 5 MG/5ML IV SOLN
INTRAVENOUS | Status: AC
  Administered 2016-10-13: 5 mg
  Filled 2016-10-13: qty 5

## 2016-10-13 MED ORDER — FUROSEMIDE 10 MG/ML IJ SOLN
120.0000 mg | Freq: Four times a day (QID) | INTRAVENOUS | Status: DC
  Administered 2016-10-13 – 2016-10-14 (×4): 120 mg via INTRAVENOUS
  Filled 2016-10-13 (×5): qty 12

## 2016-10-13 MED ORDER — CHLORHEXIDINE GLUCONATE 0.12% ORAL RINSE (MEDLINE KIT)
15.0000 mL | Freq: Two times a day (BID) | OROMUCOSAL | Status: DC
  Administered 2016-10-13 – 2016-10-23 (×20): 15 mL via OROMUCOSAL

## 2016-10-13 MED ORDER — METHYLPREDNISOLONE SODIUM SUCC 40 MG IJ SOLR
40.0000 mg | Freq: Two times a day (BID) | INTRAMUSCULAR | Status: DC
  Administered 2016-10-13 – 2016-10-14 (×4): 40 mg via INTRAVENOUS
  Filled 2016-10-13 (×5): qty 1

## 2016-10-13 NOTE — Progress Notes (Signed)
eLink Physician-Brief Progress Note Patient Name: ALEIGHYA MCANELLY DOB: 05-25-1961 MRN: [id number]   Date of Service  10/13/2016  HPI/Events of Note  Pt's HR flipped to SVT. Currently 160, regular. BP 110/70.  90% sats on 45% Fio2 Was agitated recently, looked calmer now.   (-) fever.   Does not look like she is in pain.   eICU Interventions  Check lytes now Trial lopressor IV May need adenosine Getting versed 2 mg IV     Intervention Category Major Interventions: Other:  [place] 10/13/2016, 5:22 AM

## 2016-10-13 NOTE — Care Management Note (Signed)
Case Management Note  Patient Details  Name: Rachel [name] MRN: [id number] Date of Birth: 04/20/1961  Subjective/Objective:     Admitted with Influenza B pneumonia/ARDS            Action/Plan:  Pt is intubated without family at bedside.   Expected Discharge Date:                  Expected Discharge Plan:  Home/Self Care  In-House Referral:     Discharge planning Services  CM Consult  Post Acute Care Choice:    Choice offered to:     DME Arranged:    DME Agency:     HH Arranged:    HH Agency:     Status of Service:     If discussed at [employer] of Stay Meetings, dates discussed:    Additional Comments:  Maryclare Labrador, RN 10/13/2016, 3:17 PM

## 2016-10-13 NOTE — Progress Notes (Signed)
Pharmacy Antibiotic Note  Rachel [name] is a 56 y.o. female admitted on 10/10/2016 with pneumonia.  Pharmacy has been consulted for vancomycin dosing. Patient transferred from [facility] with positive influenza B. SCr continues to rise to 4.04, increased BUN to 83, estimated CrCl 17.5 ml/min.   Vancomycin random level this morning was 37 (elevated). Given acute renal dysfunction, will discontinue vancomycin.   Plan: Discontinue Vancomycin  Continue ceftriaxone 2g IV q24h Continue tamiflu 30 mg QD Monitor C&S, renal function and length of treatment   Height: 5\' 2"  (157.5 cm) Weight: 218 lb 4.1 oz (99 kg) IBW/kg (Calculated) : 50.1  Temp (24hrs), Avg:98.5 F (36.9 C), Min:98 F (36.7 C), Max:99.4 F (37.4 C)   Recent Labs Lab 10/10/16 1507 10/11/16 0202 10/12/16 0420 10/13/16 0505  WBC 15.2* 15.5* 10.0 8.7  CREATININE 1.13* 1.81* 3.63* 4.04*  LATICACIDVEN 1.3  --   --   --   VANCORANDOM  --   --   --  37    Estimated Creatinine Clearance: 17.5 mL/min (A) (by C-G formula based on SCr of 4.04 mg/dL (H)).    Allergies  Allergen Reactions  . Hydrocodone Nausea And Vomiting  . Oxycodone Nausea And Vomiting    Codeine, hydrocodone, most pain pills.  . Penicillins Itching and Nausea And Vomiting    Has patient had a PCN reaction causing immediate rash, facial/tongue/throat swelling, SOB or lightheadedness with hypotension: Yes, around the eyes Has patient had a PCN reaction causing severe rash involving mucus membranes or skin necrosis: No Has patient had a PCN reaction that required hospitalization: No Has patient had a PCN reaction occurring within the last 10 years: No  If all of the above answers are "NO", then may proceed with Cephalosporin use.     Antimicrobials this admission: Levofloxacin ([facility]) 4/13 >> 4/14 Tamiflu 4/14 >> Ceftriaxone 4/14 >> Azithromycin 4/14 >> 4/16 Vancomycin 4/14 >> 4/17  Microbiology results: 4/14 Influenza B:  positive 4/14 HIV: negative 4/14 BCx: neg to date 4/14 UCx: no growth 4/14 Trach asp: rare GPC in pairs, too young to read 4/14 MRSA PCR: negative  Thank you for allowing pharmacy to be a part of this patient's care.  Belia Heman, PharmD PGY1 Pharmacy Resident [phone] (Pager) 10/13/2016 9:18 AM

## 2016-10-13 NOTE — Progress Notes (Addendum)
PULMONARY / CRITICAL CARE MEDICINE   Name: Rachel [name]  MRN: [id number] DOB: [birth year]    ADMISSION DATE:  10/10/2016 CONSULTATION DATE:  10/13/2016   REFERRING MD:  Alean Rinne from [place]:  Respiratory failure   BRIEF:  56 y/o female with emphysema admitted with Influenza B pneumonia/ARDS.   SUBJECTIVE:  Remains sedated on vent for ARDS, had SVT, improved with IV metop.   VITAL SIGNS: BP 97/62 (BP Location: Right Arm)   Pulse 80   Temp 98.4 F (36.9 C) (Oral)   Resp (!) 35   Ht '5\' 2"'$  (1.575 m)   Wt 218 lb 4.1 oz (99 kg)   SpO2 99%   BMI 39.92 kg/m   HEMODYNAMICS: CVP:  [15 mmHg-19 mmHg] 16 mmHg  VENTILATOR SETTINGS: Vent Mode: PRVC FiO2 (%):  [40 %-50 %] 45 % Set Rate:  [35 bmp] 35 bmp Vt Set:  [350 mL-400 mL] 370 mL PEEP:  [8 cmH20] 8 cmH20 Plateau Pressure:  [31 cmH20-38 cmH20] 34 cmH20  INTAKE / OUTPUT: I/O last 3 completed shifts: In: 5574.6 [I.V.:3691.9; NG/GT:1482.7; IV Piggyback:400] Out: 1490 [Urine:1490]  PHYSICAL EXAMINATION: General:  Sedated on vent HENT: NCAT ETT in place PULM: Crackles bilaterally with wheezing,vent supp breathing CV: RRR, no mgr GI: BS+, soft, nontender MSK: normal bulk and tone Neuro: sedated on vent  LABS:  BMET  Recent Labs Lab 10/11/16 0202 10/12/16 0420 10/13/16 0505  NA 135 136 137  K 4.2 4.5 4.1  CL 101 103 104  CO2 23 21* 22  BUN 35* 65* 83*  CREATININE 1.81* 3.63* 4.04*  GLUCOSE 133* 112* 129*    Electrolytes  Recent Labs Lab 10/11/16 0202 10/11/16 1650 10/12/16 0420 10/13/16 0505  CALCIUM 8.4*  --  8.0* 7.9*  MG 2.3 2.3  --  2.7*  PHOS 4.1 4.3  --  5.3*    CBC  Recent Labs Lab 10/11/16 0202 10/12/16 0420 10/13/16 0505  WBC 15.5* 10.0 8.7  HGB 11.0* 8.9* 7.3*  HCT 33.8* 28.1* 21.9*  PLT 286 273 256    Coag's No results for input(s): APTT, INR in the last 168 hours.  Sepsis Markers  Recent Labs Lab 10/10/16 1507  LATICACIDVEN 1.3   PROCALCITON 0.31    ABG  Recent Labs Lab 10/12/16 2119 10/13/16 0005 10/13/16 0257  PHART 7.260* 7.504* 7.337*  PCO2ART 48.2* 27.1* 42.0  PO2ART 60.0* 140.0* 87.0    Liver Enzymes  Recent Labs Lab 10/10/16 1507  AST 47*  ALT 14  ALKPHOS 59  BILITOT 0.4  ALBUMIN 3.0*    Cardiac Enzymes  Recent Labs Lab 10/10/16 1507 10/10/16 2106 10/11/16 0202  TROPONINI 0.04* <0.03 0.13*    Glucose  Recent Labs Lab 10/12/16 0741 10/12/16 1127 10/12/16 1529 10/12/16 2047 10/12/16 2325 10/13/16 0429  GLUCAP 107* 119* 113* 139* 118* 124*    Imaging No results found.   STUDIES:  CXR 4/16: Right perihilar and left basilar infiltration or consolidation.  CULTURES: 4/14 resp culture > GPC in pairs, re-incubated MRSA nasal swab neg. 4/14 urine culture >>negative. 4/14 blood culture  4/14 influenza pcr panel > POSITIVE INFLUENZA B 4/14 urine strep > neg. 4/14 urine leg >   ANTIBIOTICS: 4/13 levaquin > 4/14 4/13 Tamiflu >  4/14 Vancomycin >  4/14 Ceftriaxone >  4/14 Azithromycin > 4/16  SIGNIFICANT EVENTS: 4/13 admission [facility] 4/14 intubation, transfer to [facility] 4/15 - remains intubated  LINES/TUBES: 4/14 ETT >   4/15 CXR images independently reviewed  showing bilateral airspace disease, ETT in place  Labs from outside hospital reviewed showing normal renal function, normal CKMB, normal TSH, sodium low, ProBNP slightly elevated, troponin normal, CBC within normal limits  DISCUSSION: 56 y/o female with a past medical history of presumed COPD based on her smoking history who is here now with acute respiratory failure with hypoxemia secondary to influenza B pneumonia.    ASSESSMENT / PLAN:  PULMONARY A: Acute respiratory failure with hypoxemia from influenza and super bacterial infection  With ARDS Presumed COPD Tobacco abuse P:   Continue full vent support ARDS protocol, permissive hypercapnia, VAP bundle,  Bicarb as needed if pH <7.25  -  dc Bronchodilators scheduled and prn Peep to 5, rate to remain Plat are wnl  CARDIOVASCULAR A:  Demand ischemia Septic shock with Hypotension requiring pressor - off pressor 8/10 grd 2 diastolic dysfunction P:  tele MAP >65, met on own  RENAL A:   AKI, oliguric likely from septic shock - plat of crt  On presentation crt 1.13, going up to 4's now P:   Monitor BMET and UOP Cont Lasix 120 mg bid today, still had pos balance Goal is negative cvp 20, increase lasix dosing  GASTROINTESTINAL A:   Tolerating TF P:   Pepcid for stress ulcer prophylaxis Tube feedings per protocol Assess last BM  HEMATOLOGIC A:   Dilutional anemia P:  Check CBC in PM  And in am  lasix Monitor for bleeding  INFECTIOUS A:   CAP, 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   10/13/2016, 7:36 AM  STAFF NOTE: Linwood Dibbles, MD FACP have personally reviewed patient's available data, including medical history, events of note, physical examination and test results as part of my evaluation. I have discussed with resident/NP and other care providers such as pharmacist, RN and RRT. In addition, I personally evaluated patient and elicited key findings of: rass -2, lungs ronchi, edema exam noted, pos again with balance, pcxr pending this am encouraged of plat of crt, cvp 20 in setting of some increase rt heart - she responds to lasix 120, increase to q6h, may need zaroxyln, get lytes in pm, she is on about 7 cc/kg, she has dyschronism with lower , fortunately her plat  are where we want them to be, dc bicarb as ph greater 7.25, pcxr in am , peep to 5 will also reduce pressures , if to peep 5, would consider SBT, factt - cvp goal 4 if able, follow output to goal neg 2 liters, if able (BP allows) would add prop to limit versed, I updated daughter in full, remains likely strep sputum, low risk for ress ctx, dc vanc in setting arf, also add steroids for bronchospasm and flu pneumonitis noted The patient is critically ill with multiple organ systems failure and requires high complexity decision making for assessment and support, frequent evaluation and titration of therapies, application of advanced monitoring technologies and extensive interpretation of multiple databases.   Critical Care Time devoted to patient  care services described in this note is35 Minutes. This time reflects time of care of this signee: Merrie Roof, MD FACP. This critical care time does not reflect procedure time, or teaching time or supervisory time of PA/NP/Med student/Med Resident etc but could involve care discussion time. Rest per NP/medical resident whose note is outlined above and that I agree with   Lavon Paganini. Titus Mould, MD, [place] Pgr: [facility] 10/13/2016 9:15 AM

## 2016-10-14 ENCOUNTER — Telehealth: Payer: Self-pay | Admitting: Pulmonary Disease

## 2016-10-14 ENCOUNTER — Inpatient Hospital Stay (HOSPITAL_COMMUNITY): Payer: BLUE CROSS/BLUE SHIELD

## 2016-10-14 DIAGNOSIS — Z4659 Encounter for fitting and adjustment of other gastrointestinal appliance and device: Secondary | ICD-10-CM

## 2016-10-14 DIAGNOSIS — J9621 Acute and chronic respiratory failure with hypoxia: Secondary | ICD-10-CM

## 2016-10-14 DIAGNOSIS — Z452 Encounter for adjustment and management of vascular access device: Secondary | ICD-10-CM

## 2016-10-14 LAB — GLUCOSE, CAPILLARY
GLUCOSE-CAPILLARY: 153 mg/dL — AB (ref 65–99)
GLUCOSE-CAPILLARY: 149 mg/dL — AB (ref 65–99)
GLUCOSE-CAPILLARY: 148 mg/dL — AB (ref 65–99)
Glucose-Capillary: 120 mg/dL — ABNORMAL HIGH (ref 65–99)
Glucose-Capillary: 144 mg/dL — ABNORMAL HIGH (ref 65–99)
Glucose-Capillary: 163 mg/dL — ABNORMAL HIGH (ref 65–99)
Glucose-Capillary: 179 mg/dL — ABNORMAL HIGH (ref 65–99)

## 2016-10-14 LAB — CBC
HCT: 24.7 % — ABNORMAL LOW (ref 36.0–46.0)
HEMOGLOBIN: 8.6 g/dL — AB (ref 12.0–15.0)
MCH: 28.2 pg (ref 26.0–34.0)
MCHC: 34.8 g/dL (ref 30.0–36.0)
MCV: 81 fL (ref 78.0–100.0)
Platelets: 374 10*3/uL (ref 150–400)
RBC: 3.05 MIL/uL — ABNORMAL LOW (ref 3.87–5.11)
RDW: 14.6 % (ref 11.5–15.5)
WBC: 10.2 10*3/uL (ref 4.0–10.5)

## 2016-10-14 LAB — BASIC METABOLIC PANEL
ANION GAP: 18 — AB (ref 5–15)
Anion gap: 15 (ref 5–15)
BUN: 112 mg/dL — AB (ref 6–20)
BUN: 123 mg/dL — ABNORMAL HIGH (ref 6–20)
CALCIUM: 9.1 mg/dL (ref 8.9–10.3)
CHLORIDE: 95 mmol/L — AB (ref 101–111)
CHLORIDE: 96 mmol/L — AB (ref 101–111)
CO2: 28 mmol/L (ref 22–32)
CO2: 27 mmol/L (ref 22–32)
CREATININE: 4.01 mg/dL — AB (ref 0.44–1.00)
Calcium: 9.3 mg/dL (ref 8.9–10.3)
Creatinine, Ser: 3.67 mg/dL — ABNORMAL HIGH (ref 0.44–1.00)
GFR calc Af Amer: 15 mL/min — ABNORMAL LOW (ref >60)
GFR calc non Af Amer: 12 mL/min — ABNORMAL LOW (ref >60)
GFR, EST AFRICAN AMERICAN: 14 mL/min — AB (ref >60)
GFR, EST NON AFRICAN AMERICAN: 13 mL/min — AB (ref >60)
Glucose, Bld: 143 mg/dL — ABNORMAL HIGH (ref 65–99)
Glucose, Bld: 146 mg/dL — ABNORMAL HIGH (ref 65–99)
POTASSIUM: 4.4 mmol/L (ref 3.5–5.1)
Potassium: 4.4 mmol/L (ref 3.5–5.1)
SODIUM: 138 mmol/L (ref 135–145)
Sodium: 141 mmol/L (ref 135–145)

## 2016-10-14 LAB — BLOOD GAS, ARTERIAL
Acid-Base Excess: 0.5 mmol/L (ref 0.0–2.0)
BICARBONATE: 25.1 mmol/L (ref 20.0–28.0)
DRAWN BY: 418751
FIO2: 40
MECHVT: 370 mL
O2 Saturation: 90.8 %
PATIENT TEMPERATURE: 98.6
PCO2 ART: 43.8 mmHg (ref 32.0–48.0)
PEEP: 5 cmH2O
PO2 ART: 67.1 mmHg — AB (ref 83.0–108.0)
RATE: 35 resp/min
pH, Arterial: 7.376 (ref 7.350–7.450)

## 2016-10-14 LAB — PHOSPHORUS
PHOSPHORUS: 5.9 mg/dL — AB (ref 2.5–4.6)
Phosphorus: 6.4 mg/dL — ABNORMAL HIGH (ref 2.5–4.6)

## 2016-10-14 LAB — MAGNESIUM
Magnesium: 2.7 mg/dL — ABNORMAL HIGH (ref 1.7–2.4)
Magnesium: 2.7 mg/dL — ABNORMAL HIGH (ref 1.7–2.4)

## 2016-10-14 MED ORDER — INSULIN ASPART 100 UNIT/ML ~~LOC~~ SOLN
1.0000 [IU] | SUBCUTANEOUS | Status: DC
  Administered 2016-10-14: 2 [IU] via SUBCUTANEOUS
  Administered 2016-10-14: 1 [IU] via SUBCUTANEOUS
  Administered 2016-10-14 – 2016-10-15 (×4): 2 [IU] via SUBCUTANEOUS
  Administered 2016-10-15 – 2016-10-16 (×6): 1 [IU] via SUBCUTANEOUS
  Administered 2016-10-16 (×2): 2 [IU] via SUBCUTANEOUS
  Administered 2016-10-17: 1 [IU] via SUBCUTANEOUS
  Administered 2016-10-17: 2 [IU] via SUBCUTANEOUS
  Administered 2016-10-17: 1 [IU] via SUBCUTANEOUS
  Administered 2016-10-17: 2 [IU] via SUBCUTANEOUS
  Administered 2016-10-17 – 2016-10-21 (×12): 1 [IU] via SUBCUTANEOUS

## 2016-10-14 MED ORDER — FLUOXETINE HCL 20 MG/5ML PO SOLN
20.0000 mg | Freq: Every day | ORAL | Status: DC
  Administered 2016-10-14 – 2016-10-22 (×8): 20 mg
  Filled 2016-10-14 (×10): qty 5

## 2016-10-14 MED ORDER — BISACODYL 10 MG RE SUPP
10.0000 mg | Freq: Once | RECTAL | Status: AC
  Administered 2016-10-14: 10 mg via RECTAL
  Filled 2016-10-14: qty 1

## 2016-10-14 MED ORDER — IPRATROPIUM-ALBUTEROL 0.5-2.5 (3) MG/3ML IN SOLN
3.0000 mL | Freq: Four times a day (QID) | RESPIRATORY_TRACT | Status: DC
Start: 2016-10-14 — End: 2016-10-24
  Administered 2016-10-14 – 2016-10-24 (×42): 3 mL via RESPIRATORY_TRACT
  Filled 2016-10-14 (×41): qty 3

## 2016-10-14 MED ORDER — SENNOSIDES 8.8 MG/5ML PO SYRP
5.0000 mL | ORAL_SOLUTION | Freq: Two times a day (BID) | ORAL | Status: DC
  Administered 2016-10-14 – 2016-10-22 (×14): 5 mL
  Filled 2016-10-14 (×19): qty 5

## 2016-10-14 MED ORDER — FUROSEMIDE 10 MG/ML IJ SOLN
80.0000 mg | Freq: Every day | INTRAMUSCULAR | Status: DC
  Filled 2016-10-14: qty 8

## 2016-10-14 NOTE — Progress Notes (Signed)
Attempted SBT again, brady into the 20's. Placed back on full support

## 2016-10-14 NOTE — Telephone Encounter (Signed)
Rec'd FMLA forms via fax from [employer] - for patient daughter - sent to [employer] via interoffice mail -pr

## 2016-10-14 NOTE — Progress Notes (Signed)
PULMONARY / CRITICAL CARE MEDICINE   Name: Rachel [name]  MRN: [id number] DOB: April 30, 1961    ADMISSION DATE:  10/10/2016 CONSULTATION DATE:  10/14/2016   REFERRING MD:  Alean Rinne from [place]:  Respiratory failure   BRIEF:  56 y/o female with emphysema admitted with Influenza B pneumonia/ARDS.   SUBJECTIVE:  Remains sedated on vent for ARDS  VITAL SIGNS: BP 127/65   Pulse 65   Temp 98.3 F (36.8 C) (Oral)   Resp (!) 23   Ht 5\' 2"  (1.575 m)   Wt 222 lb 0.1 oz (100.7 kg)   SpO2 96%   BMI 40.60 kg/m   HEMODYNAMICS: CVP:  [10 mmHg-20 mmHg] 10 mmHg  VENTILATOR SETTINGS: Vent Mode: PRVC FiO2 (%):  [40 %] 40 % Set Rate:  [35 bmp] 35 bmp Vt Set:  [370 mL] 370 mL PEEP:  [5 cmH20-8 cmH20] 5 cmH20 Plateau Pressure:  [28 cmH20-38 cmH20] 28 cmH20  INTAKE / OUTPUT: I/O last 3 completed shifts: In: 4270.1 [I.V.:2156.1; NG/GT:1780; IV Piggyback:334] Out: 5176 [Urine:6140]  PHYSICAL EXAMINATION: General: sedated on vent HENT: NCAT ETT in place PULM: lungs with crackles, wheezing is better today.  CV: RRR, no mgr GI: BS+, soft, nontender MSK: normal bulk and tone Neuro: sedated on vent  LABS:  BMET  Recent Labs Lab 10/12/16 0420 10/13/16 0505 10/13/16 1805  NA 136 137 139  K 4.5 4.1 4.4  CL 103 104 104  CO2 21* 22 23  BUN 65* 83* 90*  CREATININE 3.63* 4.04* 3.83*  GLUCOSE 112* 129* 179*    Electrolytes  Recent Labs Lab 10/11/16 1650 10/12/16 0420 10/13/16 0505 10/13/16 1805  CALCIUM  --  8.0* 7.9* 7.9*  MG 2.3  --  2.7* 2.5*  PHOS 4.3  --  5.3* 5.8*    CBC  Recent Labs Lab 10/11/16 0202 10/12/16 0420 10/13/16 0505  WBC 15.5* 10.0 8.7  HGB 11.0* 8.9* 7.3*  HCT 33.8* 28.1* 21.9*  PLT 286 273 256    Coag's No results for input(s): APTT, INR in the last 168 hours.  Sepsis Markers  Recent Labs Lab 10/10/16 1507  LATICACIDVEN 1.3  PROCALCITON 0.31    ABG  Recent Labs Lab 10/13/16 0005  10/13/16 0257 10/14/16 0330  PHART 7.504* 7.337* 7.376  PCO2ART 27.1* 42.0 43.8  PO2ART 140.0* 87.0 67.1*    Liver Enzymes  Recent Labs Lab 10/10/16 1507  AST 47*  ALT 14  ALKPHOS 59  BILITOT 0.4  ALBUMIN 3.0*    Cardiac Enzymes  Recent Labs Lab 10/10/16 1507 10/10/16 2106 10/11/16 0202  TROPONINI 0.04* <0.03 0.13*    Glucose  Recent Labs Lab 10/13/16 0741 10/13/16 1145 10/13/16 1533 10/13/16 2007 10/14/16 0022 10/14/16 0408  GLUCAP 135* 132* 157* 160* 163* 153*    Imaging Dg Chest Port 1 View  Result Date: 10/14/2016 CLINICAL DATA:  Respiratory distress EXAM: PORTABLE CHEST 1 VIEW COMPARISON:  10/13/2016 FINDINGS: Endotracheal tube hand nasogastric catheter are again identified. The proximal side port of the nasogastric catheter again lies in the distal esophagus. Left jugular central line is noted in the proximal superior vena cava. No pneumothorax is noted. Patchy infiltrates are again identified throughout both lung somewhat accentuated by poor inspiratory effort. No bony abnormality is noted. IMPRESSION: Patchy bilateral infiltrates which are somewhat accentuated by poor inspiratory effort. Tubes and lines as described. Electronically Signed   By: Inez Catalina M.D.   On: 10/14/2016 07:25   Dg Chest [facility] 1 [address]  Result Date: 10/13/2016 CLINICAL DATA:  Ventilator dependent respiratory failure. Follow-up pneumonia. EXAM: PORTABLE CHEST 1 VIEW COMPARISON:  Portable chest x-rays yesterday and earlier. Two-view chest x-ray 10/08/2016 and earlier. FINDINGS: Endotracheal tube tip in satisfactory position projecting approximately 4 cm above the carina. Nasogastric tube now courses below the diaphragm into the stomach, though its tip is not included on the image. Left subclavian central venous catheter tip projects over the mid SVC, unchanged. Marked interval improvement in aeration of the right lung since yesterday, with residual airspace consolidation in the right upper  lobe. Stable patchy airspace opacities at the left lung base. Improved patchy airspace opacities in the left upper lobe. No new pulmonary parenchymal abnormalities. IMPRESSION: 1.  Support apparatus satisfactory. 2. Improving bilateral pneumonia, with residual patchy airspace opacities in left upper lobe, left base and right upper lobe. 3. No new abnormalities. Electronically Signed   By: Evangeline Dakin M.D.   On: 10/13/2016 09:45     STUDIES:  CXR 4/16: Right perihilar and left basilar infiltration or consolidation. CXR 4/17 and 4/18 - patchy b/l infiltrates  CULTURES: 4/14 resp culture > normal flora MRSA nasal swab neg. 4/14 urine culture >>negative. 4/14 blood culture  4/14 influenza pcr panel > POSITIVE INFLUENZA B 4/14 urine strep > neg. 4/14 urine leg >   ANTIBIOTICS: 4/13 levaquin > 4/14 4/14 Azithromycin > 4/16 4/14 Vancomycin > 4/17 4/13 Tamiflu >  4/14 Ceftriaxone > stop 19th   SIGNIFICANT EVENTS: 4/13 admission [place] 4/14 intubation, transfer to [facility] 4/15 - remains intubated 4/17 - on vent, improving, diuresed well   LINES/TUBES: 4/14 ETT >   4/15 CXR images independently reviewed showing bilateral airspace disease, ETT in place  Labs from outside hospital reviewed showing normal renal function, normal CKMB, normal TSH, sodium low, ProBNP slightly elevated, troponin normal, CBC within normal limits  DISCUSSION: 56 y/o female with a past medical history of presumed COPD based on her smoking history who is here now with acute respiratory failure with hypoxemia secondary to influenza B pneumonia.    ASSESSMENT / PLAN:  PULMONARY A: Acute respiratory failure with hypoxemia from influenza and super bacterial infection  With ARDS Presumed COPD with wheezing improving with bronchodilators and steroid  Tobacco abuse P:   Continue full vent support ARDS protocol, permissive hypercapnia, VAP bundle, consider weaning today Bronchodilators scheduled and  prn and solumedrol Cont lasix to CVP Goal 4 for ARDS,but likely we will not achieve it with her baseline CVP being high from pulm HTN.    CARDIOVASCULAR A:  Demand ischemia Septic shock with Hypotension requiring pressor - off pressor 6/07 grd 2 diastolic dysfunction P:  Tele, MAP >65  RENAL A:   AKI, oliguric likely from septic shock - crt coming down.  P:   Monitor BMET and UOP Cont Lasix, reduce, cvp down  GASTROINTESTINAL A:   Tolerating TF P:   Pepcid for stress ulcer prophylaxis Tube feedings per protocol last BM needed   HEMATOLOGIC A:   Dilutional anemia improved P:  Follow CBC lasix Monitor for bleeding  INFECTIOUS A:   Influenza B (by report, can't see lab result from outside hospital) with possible bacterial superinfection P:   Continue tamiflu, dose adjusted, end date 4/19 cont ceftriaxone, till 19th  ENDOCRINE A:   Hyperglycemia P:   Monitor glucose  NEUROLOGIC A:   Sedation needs for vent synchrony Depression, anxiety P:   RASS goal: 0 PAD protocol: fentanyl gtt and prn versed   Dellia Nims, M.D.  10/14/2016, 7:46 AM   STAFF NOTE: I, Merrie Roof, MD FACP have personally reviewed patient's available data, including medical history, events of note, physical examination and test results as part of my evaluation. I have discussed with resident/NP and other care providers such as pharmacist, RN and RRT. In addition, I personally evaluated patient and elicited key findings of: awakens, fc, lungs ronchi mild, less wheezing with steroids, less edema, plat and lower crt with neg balance then slight rise, reduce lasix, keep plat less 30 , currently well below goal and ph wnl, rate to 28 in hopes she tolerates less sedation, needs Bders, pcxr overall slight better, SBT wean is goal cpap ps 5/5, goal 2 hours, keep steroids same dose, upright, PT consult, goal to fent 150, prn versed, if needed can use prop in place of versed and to lower  fentanyl, encouraged about renal fxn, need BM, add dulc supp, may need lactulose, normal flora noted, ctx to stop at 7 days, tamiflu to decide to extend, I updated daughter sin full The patient is critically ill with multiple organ systems failure and requires high complexity decision making for assessment and support, frequent evaluation and titration of therapies, application of advanced monitoring technologies and extensive interpretation of multiple databases.   Critical Care Time devoted to patient care services described in this note is35 Minutes. This time reflects time of care of this signee: Merrie Roof, MD FACP. This critical care time does not reflect procedure time, or teaching time or supervisory time of PA/NP/Med student/Med Resident etc but could involve care discussion time. Rest per NP/medical resident whose note is outlined above and that I agree with   Lavon Paganini. Titus Mould, MD, [place] Pgr: [facility] 10/14/2016 9:21 AM

## 2016-10-15 ENCOUNTER — Inpatient Hospital Stay (HOSPITAL_COMMUNITY): Payer: BLUE CROSS/BLUE SHIELD

## 2016-10-15 LAB — GLUCOSE, CAPILLARY
GLUCOSE-CAPILLARY: 162 mg/dL — AB (ref 65–99)
GLUCOSE-CAPILLARY: 106 mg/dL — AB (ref 65–99)
GLUCOSE-CAPILLARY: 124 mg/dL — AB (ref 65–99)
Glucose-Capillary: 148 mg/dL — ABNORMAL HIGH (ref 65–99)
Glucose-Capillary: 118 mg/dL — ABNORMAL HIGH (ref 65–99)
Glucose-Capillary: 128 mg/dL — ABNORMAL HIGH (ref 65–99)

## 2016-10-15 LAB — CBC
HCT: 27.7 % — ABNORMAL LOW (ref 36.0–46.0)
Hemoglobin: 9.4 g/dL — ABNORMAL LOW (ref 12.0–15.0)
MCH: 27.7 pg (ref 26.0–34.0)
MCHC: 33.9 g/dL (ref 30.0–36.0)
MCV: 81.7 fL (ref 78.0–100.0)
PLATELETS: 535 10*3/uL — AB (ref 150–400)
RBC: 3.39 MIL/uL — AB (ref 3.87–5.11)
RDW: 14.1 % (ref 11.5–15.5)
WBC: 14.3 10*3/uL — ABNORMAL HIGH (ref 4.0–10.5)

## 2016-10-15 LAB — BASIC METABOLIC PANEL
Anion gap: 13 (ref 5–15)
BUN: 131 mg/dL — AB (ref 6–20)
CALCIUM: 9.2 mg/dL (ref 8.9–10.3)
CO2: 29 mmol/L (ref 22–32)
Chloride: 101 mmol/L (ref 101–111)
Creatinine, Ser: 3.12 mg/dL — ABNORMAL HIGH (ref 0.44–1.00)
GFR calc Af Amer: 18 mL/min — ABNORMAL LOW (ref >60)
GFR, EST NON AFRICAN AMERICAN: 16 mL/min — AB (ref >60)
GLUCOSE: 158 mg/dL — AB (ref 65–99)
POTASSIUM: 4.4 mmol/L (ref 3.5–5.1)
Sodium: 143 mmol/L (ref 135–145)

## 2016-10-15 LAB — CULTURE, BLOOD (ROUTINE X 2)
CULTURE: NO GROWTH
Culture: NO GROWTH
SPECIAL REQUESTS: ADEQUATE
Special Requests: ADEQUATE

## 2016-10-15 LAB — TRIGLYCERIDES: TRIGLYCERIDES: 232 mg/dL — AB (ref <150)

## 2016-10-15 LAB — MAGNESIUM: Magnesium: 3.1 mg/dL — ABNORMAL HIGH (ref 1.7–2.4)

## 2016-10-15 LAB — PHOSPHORUS: Phosphorus: 5.5 mg/dL — ABNORMAL HIGH (ref 2.5–4.6)

## 2016-10-15 MED ORDER — CHLORHEXIDINE GLUCONATE CLOTH 2 % EX PADS
6.0000 | MEDICATED_PAD | Freq: Every day | CUTANEOUS | Status: DC
  Administered 2016-10-15 – 2016-10-25 (×10): 6 via TOPICAL

## 2016-10-15 MED ORDER — PROPOFOL 1000 MG/100ML IV EMUL
5.0000 ug/kg/min | INTRAVENOUS | Status: DC
  Administered 2016-10-15 – 2016-10-16 (×3): 15 ug/kg/min via INTRAVENOUS
  Filled 2016-10-15 (×3): qty 100

## 2016-10-15 MED ORDER — METHYLPREDNISOLONE SODIUM SUCC 40 MG IJ SOLR
20.0000 mg | Freq: Two times a day (BID) | INTRAMUSCULAR | Status: DC
  Administered 2016-10-15 – 2016-10-16 (×3): 20 mg via INTRAVENOUS
  Filled 2016-10-15 (×4): qty 0.5

## 2016-10-15 MED ORDER — SODIUM CHLORIDE 0.9% FLUSH
10.0000 mL | Freq: Two times a day (BID) | INTRAVENOUS | Status: DC
  Administered 2016-10-15 – 2016-10-16 (×2): 10 mL
  Administered 2016-10-16: 20 mL
  Administered 2016-10-17 – 2016-10-25 (×15): 10 mL

## 2016-10-15 MED ORDER — LABETALOL HCL 5 MG/ML IV SOLN
10.0000 mg | INTRAVENOUS | Status: DC | PRN
  Administered 2016-10-15 – 2016-10-16 (×3): 10 mg via INTRAVENOUS
  Filled 2016-10-15 (×3): qty 4

## 2016-10-15 MED ORDER — METOPROLOL TARTRATE 25 MG/10 ML ORAL SUSPENSION
25.0000 mg | Freq: Two times a day (BID) | ORAL | Status: DC
  Administered 2016-10-15 (×2): 25 mg
  Filled 2016-10-15 (×3): qty 10

## 2016-10-15 MED ORDER — SODIUM CHLORIDE 0.9% FLUSH: 10.0000 mL | INTRAVENOUS | Status: DC | PRN

## 2016-10-15 MED ORDER — FENTANYL CITRATE (PF) 100 MCG/2ML IJ SOLN
100.0000 ug | INTRAMUSCULAR | Status: DC | PRN
  Administered 2016-10-16 – 2016-10-21 (×15): 100 ug via INTRAVENOUS
  Filled 2016-10-15 (×14): qty 2

## 2016-10-15 NOTE — Progress Notes (Signed)
PT Cancellation Note  Patient Details Name: Rachel [name] MRN: [id number] DOB: Apr 02, 1961   Cancelled Treatment:    Reason Eval/Treat Not Completed: Medical issues which prohibited therapy (pt sedated and intubated with strict bedrest orders, RN confirmed not appropriate at this time. Will check next date to see if pt therapy appropriate or need to sign off. )   [name] B [name] 10/15/2016, 9:42 AM  [name], [place]

## 2016-10-15 NOTE — Progress Notes (Signed)
PULMONARY / CRITICAL CARE MEDICINE   Name: Rachel [name]  MRN: [id number] DOB: [birth year]    ADMISSION DATE:  10/10/2016 CONSULTATION DATE:  10/15/2016   REFERRING MD:  Alean Rinne from [place]:  Respiratory failure   BRIEF:  56 y/o female with emphysema admitted with Influenza B pneumonia/ARDS.   SUBJECTIVE:  Remains sedated on vent for ARDS  VITAL SIGNS: BP (!) 170/81   Pulse 81   Temp 98.3 F (36.8 C) (Oral)   Resp 16   Ht 5' 2" (1.575 m)   Wt 222 lb 0.1 oz (100.7 kg)   SpO2 90%   BMI 40.60 kg/m   HEMODYNAMICS: CVP:  [14 mmHg] 14 mmHg  VENTILATOR SETTINGS: Vent Mode: PSV;CPAP FiO2 (%):  [40 %] 40 % Set Rate:  [28 bmp] 28 bmp Vt Set:  [370 mL] 370 mL PEEP:  [5 cmH20] 5 cmH20 Pressure Support:  [5 cmH20] 5 cmH20 Plateau Pressure:  [22 cmH20-28 cmH20] 27 cmH20  INTAKE / OUTPUT: I/O last 3 completed shifts: In: 3273.1 [I.V.:1539.1; NG/GT:1560; IV Piggyback:174] Out: 2297 [Urine:6550]  PHYSICAL EXAMINATION: General: sedated on vent HENT: NCAT ETT in place PULM: lungs with some crackles improving, wheezing noted.  CV: RRR, no mgr GI: BS+, soft, nontender MSK: normal bulk and tone Neuro: sedated on vent  LABS:  BMET  Recent Labs Lab 10/14/16 0800 10/14/16 1800 10/15/16 0750  NA 138 141 143  K 4.4 4.4 4.4  CL 95* 96* 101  CO2 _0 BUN 112* 123* 131*  CREATININE 4.01* 3.67* 3.12*  GLUCOSE 143* 146* 158*    Electrolytes  Recent Labs Lab 10/14/16 0800 10/14/16 1800 10/15/16 0750  CALCIUM 9.1 9.3 9.2  MG 2.7* 2.7* 3.1*  PHOS 5.9* 6.4* 5.5*    CBC  Recent Labs Lab 10/13/16 0505 10/14/16 0750 10/15/16 0750  WBC 8.7 10.2 14.3*  HGB 7.3* 8.6* 9.4*  HCT 21.9* 24.7* 27.7*  PLT 256 374 535*    Coag's No results for input(s): APTT, INR in the last 168 hours.  Sepsis Markers  Recent Labs Lab 10/10/16 1507  LATICACIDVEN 1.3  PROCALCITON 0.31    ABG  Recent Labs Lab 10/13/16 0005  10/13/16 0257 10/14/16 0330  PHART 7.504* 7.337* 7.376  PCO2ART 27.1* 42.0 43.8  PO2ART 140.0* 87.0 67.1*    Liver Enzymes  Recent Labs Lab 10/10/16 1507  AST 47*  ALT 14  ALKPHOS 59  BILITOT 0.4  ALBUMIN 3.0*    Cardiac Enzymes  Recent Labs Lab 10/10/16 1507 10/10/16 2106 10/11/16 0202  TROPONINI 0.04* <0.03 0.13*    Glucose  Recent Labs Lab 10/14/16 1124 10/14/16 1534 10/14/16 1954 10/14/16 2317 10/15/16 0313 10/15/16 0735  GLUCAP 120* 179* 148* 144* 162* 148*    Imaging No results found.   STUDIES:  CXR 4/16: Right perihilar and left basilar infiltration or consolidation. CXR 4/17 and 4/18 - patchy b/l infiltrates  CULTURES: 4/14 resp culture > normal flora MRSA nasal swab neg. 4/14 urine culture >>negative. 4/14 blood culture  4/14 influenza pcr panel > POSITIVE INFLUENZA B 4/14 urine strep > neg. 4/14 urine leg >   ANTIBIOTICS: 4/13 levaquin > 4/14 4/14 Azithromycin > 4/16 4/14 Vancomycin > 4/17 4/13 Tamiflu >  4/14 Ceftriaxone > stop 19th   SIGNIFICANT EVENTS: 4/13 admission [place] 4/14 intubation, transfer to [facility] 4/15 - remains intubated 4/17 - on vent, improving, diuresed well   LINES/TUBES: 4/14 ETT >  [facility] CVL PIV   DISCUSSION: 55  y/o female with a past medical history of presumed COPD based on her smoking history who is here now with acute respiratory failure with hypoxemia secondary to influenza B pneumonia.    ASSESSMENT / PLAN:  PULMONARY A: Acute respiratory failure with hypoxemia from influenza and super bacterial infection  With ARDS Presumed COPD with wheezing improving with bronchodilators and steroid  Tobacco abuse P:   Continue full vent support ARDS protocol, permissive hypercapnia, VAP bundle, consider weaning today Bronchodilators scheduled and prn and solumedrol Cont lasix to CVP goal 4 for ARDS,but likely we will not achieve it with her baseline CVP being high from pulm HTN.     CARDIOVASCULAR A:  Demand ischemia Septic shock with Hypotension requiring pressor - off pressor 5/27 grd 2 diastolic dysfunction P:  Tele, MAP >65 HTN, add met scheduled  RENAL A:   AKI -,likely from ATN, improving- crt coming down.  P:   Monitor BMET and UOP Lasix, see attending note  GASTROINTESTINAL A:   Tolerating TF P:   Pepcid for stress ulcer prophylaxis Tube feedings per protocol Bowel regimen enema  HEMATOLOGIC A:   Dilutional anemia improved Leukocytosis - likely from steroid  And lasix hemoconcentration P:  Follow CBC Lasix dc Monitor for bleeding  INFECTIOUS A:   Influenza B (by report, can't see lab result from outside hospital) with possible bacterial superinfection P:   Continue tamiflu, dose adjusted, end date 4/19 cont ceftriaxone, till 20th  ENDOCRINE A:   Hyperglycemia Steroids in use P:   Monitor glucose Reduce steroids  NEUROLOGIC A:   Sedation needs for vent synchrony Depression, anxiety P:   RASS goal: 0 PAD protocol: fentanyl gtt and prn versed   Dellia Nims, M.D.  STAFF NOTE: I, Merrie Roof, MD FACP have personally reviewed patient's available data, including medical history, events of note, physical examination and test results as part of my evaluation. I have discussed with resident/NP and other care providers such as pharmacist, RN and RRT. In addition, I personally evaluated patient and elicited key findings of: rass -2, not fc, lungs improved aeration / coarse BS, edema low, neg 1.2 liters, crt down, bun 130, hemoconcetration noted and cvp likely at euvolemic wityh pa htn 59 = dc lasix, observe BUN and reduce steroids with radiographic and clinical vent progress, wean now cpap 5 ps5 ,goal 1 hour, dc fent for full WUA, upright, hold TF, enema for BM  Needed, CTX to dc in am , allow tamiflu to dc at course as again improved, if maintain ett will advance OGT, stop Tf now The patient is critically ill with multiple  organ systems failure and requires high complexity decision making for assessment and support, frequent evaluation and titration of therapies, application of advanced monitoring technologies and extensive interpretation of multiple databases.   Critical Care Time devoted to patient care services described in this note is35 Minutes. This time reflects time of care of this signee: Merrie Roof, MD FACP. This critical care time does not reflect procedure time, or teaching time or supervisory time of PA/NP/Med student/Med Resident etc but could involve care discussion time. Rest per NP/medical resident whose note is outlined above and that I agree with   Lavon Paganini. Titus Mould, MD, [place] Pgr: [facility] 10/15/2016 10:04 AM

## 2016-10-16 ENCOUNTER — Encounter (HOSPITAL_COMMUNITY): Payer: Self-pay

## 2016-10-16 DIAGNOSIS — R0603 Acute respiratory distress: Secondary | ICD-10-CM

## 2016-10-16 LAB — CBC
HCT: 28.3 % — ABNORMAL LOW (ref 36.0–46.0)
Hemoglobin: 9.5 g/dL — ABNORMAL LOW (ref 12.0–15.0)
MCH: 28.2 pg (ref 26.0–34.0)
MCHC: 33.6 g/dL (ref 30.0–36.0)
MCV: 84 fL (ref 78.0–100.0)
PLATELETS: 580 10*3/uL — AB (ref 150–400)
RBC: 3.37 MIL/uL — AB (ref 3.87–5.11)
RDW: 14.1 % (ref 11.5–15.5)
WBC: 12.1 10*3/uL — AB (ref 4.0–10.5)

## 2016-10-16 LAB — BASIC METABOLIC PANEL
ANION GAP: 14 (ref 5–15)
Anion gap: 11 (ref 5–15)
BUN: 129 mg/dL — ABNORMAL HIGH (ref 6–20)
BUN: 113 mg/dL — ABNORMAL HIGH (ref 6–20)
CHLORIDE: 106 mmol/L (ref 101–111)
CHLORIDE: 109 mmol/L (ref 101–111)
CO2: 30 mmol/L (ref 22–32)
CO2: 29 mmol/L (ref 22–32)
CREATININE: 2.67 mg/dL — AB (ref 0.44–1.00)
Calcium: 9.3 mg/dL (ref 8.9–10.3)
Calcium: 9.4 mg/dL (ref 8.9–10.3)
Creatinine, Ser: 2.35 mg/dL — ABNORMAL HIGH (ref 0.44–1.00)
GFR calc Af Amer: 26 mL/min — ABNORMAL LOW (ref >60)
GFR calc non Af Amer: 19 mL/min — ABNORMAL LOW (ref >60)
GFR calc non Af Amer: 22 mL/min — ABNORMAL LOW (ref >60)
GFR, EST AFRICAN AMERICAN: 22 mL/min — AB (ref >60)
GLUCOSE: 163 mg/dL — AB (ref 65–99)
Glucose, Bld: 148 mg/dL — ABNORMAL HIGH (ref 65–99)
POTASSIUM: 4.4 mmol/L (ref 3.5–5.1)
Potassium: 5.2 mmol/L — ABNORMAL HIGH (ref 3.5–5.1)
SODIUM: 147 mmol/L — AB (ref 135–145)
Sodium: 152 mmol/L — ABNORMAL HIGH (ref 135–145)

## 2016-10-16 LAB — GLUCOSE, CAPILLARY
GLUCOSE-CAPILLARY: 134 mg/dL — AB (ref 65–99)
GLUCOSE-CAPILLARY: 136 mg/dL — AB (ref 65–99)
GLUCOSE-CAPILLARY: 170 mg/dL — AB (ref 65–99)
Glucose-Capillary: 141 mg/dL — ABNORMAL HIGH (ref 65–99)
Glucose-Capillary: 150 mg/dL — ABNORMAL HIGH (ref 65–99)
Glucose-Capillary: 155 mg/dL — ABNORMAL HIGH (ref 65–99)

## 2016-10-16 LAB — PHOSPHORUS: Phosphorus: 3.2 mg/dL (ref 2.5–4.6)

## 2016-10-16 LAB — MAGNESIUM: MAGNESIUM: 3.1 mg/dL — AB (ref 1.7–2.4)

## 2016-10-16 MED ORDER — METOPROLOL TARTRATE 25 MG/10 ML ORAL SUSPENSION
50.0000 mg | Freq: Two times a day (BID) | ORAL | Status: DC
  Administered 2016-10-16 (×2): 50 mg
  Filled 2016-10-16 (×3): qty 20

## 2016-10-16 MED ORDER — LABETALOL HCL 5 MG/ML IV SOLN
20.0000 mg | INTRAVENOUS | Status: DC | PRN
  Administered 2016-10-17: 20 mg via INTRAVENOUS
  Filled 2016-10-16: qty 4

## 2016-10-16 MED ORDER — FREE WATER
400.0000 mL | Freq: Four times a day (QID) | Status: DC
  Administered 2016-10-16 – 2016-10-17 (×2): 400 mL

## 2016-10-16 MED ORDER — LABETALOL HCL 5 MG/ML IV SOLN
20.0000 mg | Freq: Once | INTRAVENOUS | Status: AC
  Administered 2016-10-16: 20 mg via INTRAVENOUS

## 2016-10-16 MED ORDER — DEXMEDETOMIDINE HCL 200 MCG/2ML IV SOLN
0.4000 ug/kg/h | INTRAVENOUS | Status: DC
  Administered 2016-10-16: 0.4 ug/kg/h via INTRAVENOUS
  Filled 2016-10-16 (×2): qty 2

## 2016-10-16 MED ORDER — LACTULOSE 10 GM/15ML PO SOLN
30.0000 g | Freq: Three times a day (TID) | ORAL | Status: DC
  Administered 2016-10-16: 30 g
  Filled 2016-10-16 (×2): qty 45

## 2016-10-16 MED ORDER — FREE WATER
300.0000 mL | Freq: Four times a day (QID) | Status: DC
  Administered 2016-10-16 (×2): 300 mL

## 2016-10-16 MED ORDER — SODIUM CHLORIDE 0.9 % IV SOLN
0.4000 ug/kg/h | INTRAVENOUS | Status: DC
  Administered 2016-10-16 – 2016-10-17 (×4): 1.2 ug/kg/h via INTRAVENOUS
  Administered 2016-10-17: 0.8 ug/kg/h via INTRAVENOUS
  Administered 2016-10-17 (×2): 1.2 ug/kg/h via INTRAVENOUS
  Filled 2016-10-16 (×7): qty 4

## 2016-10-16 MED ORDER — DEXTROSE 5 % IV SOLN
INTRAVENOUS | Status: DC
  Administered 2016-10-16 – 2016-10-21 (×9): via INTRAVENOUS

## 2016-10-16 NOTE — Progress Notes (Signed)
Nutrition Follow-up  DOCUMENTATION CODES:   Obesity unspecified  INTERVENTION:   Continue:   Vital High Protein at 50 ml/h (1200 ml per day)  Provides 1200 kcal, 105 gm protein, 1003 ml free water daily  NUTRITION DIAGNOSIS:   Inadequate oral intake related to inability to eat as evidenced by NPO status.  Ongoing  GOAL:   Provide needs based on ASPEN/SCCM guidelines  Met  MONITOR:   Vent status, TF tolerance, Labs, I & O's  ASSESSMENT:   56 y/o female with emphysema admitted with Influenza B pneumonia/ARDS.  Discussed patient in ICU rounds and with RN today. Vent weaning continues. Remains hypertensive. Propofol off, Precedex started today. Patient remains intubated on ventilator support MV: 10.3 L/min Temp (24hrs), Avg:99.4 F (37.4 C), Min:98.8 F (37.1 C), Max:100.9 F (38.3 C)   Patient is currently receiving Vital High Protein via OGT at 50 ml/h (1200 ml/day) to provide 1200 kcals, 105 gm protein, 1003 ml free water daily. Tolerating well. Also receiving 300 ml free water flushes QID.  Labs and medications reviewed.  Diet Order:  Diet NPO time specified  Skin:  Reviewed, no issues  Last BM:  unknown  Height:   Ht Readings from Last 1 Encounters:  10/10/16 _0  (1.575 m)    Weight:   Wt Readings from Last 1 Encounters:  10/16/16 222 lb 0.1 oz (100.7 kg)    Ideal Body Weight:  50 kg  BMI:  Body mass index is 40.6 kg/m.  Estimated Nutritional Needs:   Kcal:  1610-9604  Protein:  100 gm  Fluid:  1.5 L  EDUCATION NEEDS:   No education needs identified at this time  Molli Barrows, [place], [place], [place] Pager [phone] After Hours Pager [phone]

## 2016-10-16 NOTE — Progress Notes (Addendum)
PULMONARY / CRITICAL CARE MEDICINE   Name: Rachel [name]  MRN: [id number] DOB: 09-Dec-1960    ADMISSION DATE:  10/10/2016 CONSULTATION DATE:  10/16/2016   REFERRING MD:  Alean Rinne from [place]:  Respiratory failure   BRIEF:  56 y/o female with emphysema admitted with Influenza B pneumonia/ARDS.   SUBJECTIVE: This morning, she is HTN, weaning  VITAL SIGNS: BP (!) 169/78   Pulse 100   Temp 98.8 F (37.1 C) (Axillary)   Resp (!) 28   Ht 5\' 2"  (1.575 m)   Wt 222 lb 0.1 oz (100.7 kg)   SpO2 97%   BMI 40.60 kg/m   HEMODYNAMICS: CVP:  [12 mmHg-16 mmHg] 12 mmHg  VENTILATOR SETTINGS: Vent Mode: PRVC FiO2 (%):  [40 %] 40 % Set Rate:  [28 bmp] 28 bmp Vt Set:  [370 mL] 370 mL PEEP:  [5 cmH20] 5 cmH20 Pressure Support:  [5 cmH20] 5 cmH20 Plateau Pressure:  [23 cmH20-26 cmH20] 26 cmH20  INTAKE / OUTPUT: I/O last 3 completed shifts: In: 2992.1 [I.V.:1259.6; NG/GT:1632.5; IV Piggyback:100] Out: 5462 [Urine:3950]  PHYSICAL EXAMINATION: Physical Exam  Constitutional: No distress.  HENT:  Head: Normocephalic and atraumatic.  ETT in place  Neck:  L IJ in place  Cardiovascular: Regular rhythm and intact distal pulses.   Tachycardic  Pulmonary/Chest: Effort normal. She has wheezes.  Skin: Skin is warm and dry. She is not diaphoretic.     LABS:  BMET  Recent Labs Lab 10/14/16 1800 10/15/16 0750 10/16/16 0322  NA 141 143 147*  K 4.4 4.4 5.2*  CL 96* 101 106  CO2 27 29 30   BUN 123* 131* 129*  CREATININE 3.67* 3.12* 2.67*  GLUCOSE 146* 158* 148*    Electrolytes  Recent Labs Lab 10/14/16 1800 10/15/16 0750 10/16/16 0322  CALCIUM 9.3 9.2 9.3  MG 2.7* 3.1* 3.1*  PHOS 6.4* 5.5* 3.2    CBC  Recent Labs Lab 10/14/16 0750 10/15/16 0750 10/16/16 0322  WBC 10.2 14.3* 12.1*  HGB 8.6* 9.4* 9.5*  HCT 24.7* 27.7* 28.3*  PLT 374 535* 580*    Coag's No results for input(s): APTT, INR in the last 168 hours.  Sepsis  Markers  Recent Labs Lab 10/10/16 1507  LATICACIDVEN 1.3  PROCALCITON 0.31    ABG  Recent Labs Lab 10/13/16 0005 10/13/16 0257 10/14/16 0330  PHART 7.504* 7.337* 7.376  PCO2ART 27.1* 42.0 43.8  PO2ART 140.0* 87.0 67.1*    Liver Enzymes  Recent Labs Lab 10/10/16 1507  AST 47*  ALT 14  ALKPHOS 59  BILITOT 0.4  ALBUMIN 3.0*    Cardiac Enzymes  Recent Labs Lab 10/10/16 1507 10/10/16 2106 10/11/16 0202  TROPONINI 0.04* <0.03 0.13*    Glucose  Recent Labs Lab 10/15/16 0735 10/15/16 1123 10/15/16 1528 10/15/16 1921 10/15/16 2331 10/16/16 0343  GLUCAP 148* 118* 106* 124* 128* 134*    Imaging Dg Chest Port 1 View  Result Date: 10/15/2016 CLINICAL DATA:  Shortness or breath, ARDS EXAM: PORTABLE CHEST 1 VIEW COMPARISON:  10/14/2016 FINDINGS: Cardiomediastinal silhouette is stable. Endotracheal tube is unchanged in position with tip 2.4 cm above the carina. There is NG tube with tip in distal esophagus about 4 cm from GE junction. The NG tube should be advanced into the stomach. Persistent bilateral patchy infiltrates and mild reticular interstitial prominence/bronchitic changes. IMPRESSION: Endotracheal tube is unchanged in position with tip 2.4 cm above the carina. There is NG tube with tip in distal esophagus about 4 cm  from GE junction. The NG tube should be advanced into the stomach. Persistent bilateral patchy infiltrates and mild reticular interstitial prominence/bronchitic changes. Electronically Signed   By: Lahoma Crocker M.D.   On: 10/15/2016 09:26   Dg Abd Portable 1v  Result Date: 10/15/2016 CLINICAL DATA:  Feeding tube placement. EXAM: PORTABLE ABDOMEN - 1 VIEW COMPARISON:  10/12/2016. FINDINGS: Breathing motion blurring. Grossly normal bowel gas pattern. Nasogastric tube side hole in the mid stomach and tip in the distal stomach. Lower thoracic spine degenerative changes. IMPRESSION: Nasogastric tube tip and side hole in the stomach, as described above.  Electronically Signed   By: Claudie Revering M.D.   On: 10/15/2016 18:59    STUDIES:  CXR 4/16: Right perihilar and left basilar infiltration or consolidation. CXR 4/17 and 4/18 - patchy b/l infiltrates CXR 4/19 persistent b/l infiltrates Ab XR 4/19 NG tube tip in distal stomach   CULTURES: 4/14 resp culture > normal flora MRSA nasal swab neg. 4/14 urine culture >>negative. 4/14 blood culture  >> negative 4/14 influenza pcr panel > POSITIVE INFLUENZA B 4/14 urine strep > neg. 4/14 urine leg >    ANTIBIOTICS: 4/13 levaquin > 4/14 4/14 Azithromycin > 4/16 4/14 Vancomycin > 4/17 4/13 Tamiflu > 4/18 4/14 Ceftriaxone > 4/20   SIGNIFICANT EVENTS: 4/13 admission [place] 4/14 intubation, transfer to [facility] 4/15 - remains intubated 4/17 - on vent, improving, diuresed well   LINES/TUBES: 4/14 ETT >  [facility] CVL PIV   DISCUSSION: 56 y/o female with a past medical history of presumed COPD based on her smoking history who is here now with acute respiratory failure with hypoxemia secondary to influenza B pneumonia.    ASSESSMENT / PLAN:  PULMONARY A: Acute respiratory failure with hypoxemia from influenza and super bacterial infection  With ARDS Presumed COPD with wheezing improving with bronchodilators and steroid  Tobacco abuse P:   Continue full vent support ARDS protocol, permissive hypercapnia, VAP bundle Attempt weaning today Bronchodilators scheduled and prn and solumedrol Holding Lasix to allow for BUN/Crt to downtrend  CARDIOVASCULAR A:  Demand ischemia Septic shock with Hypotension requiring pressor - off pressor 1/69 Grd 2 diastolic dysfunction Hypertension: BP 100-175/80s. P:  Continue metoprolol 50 mg twice daily with labetalol prn  RENAL A:   AKI, likely from ATN: Crt down to 2.7 this morning. -1.1L from yesterday though still +2.7L from admission. P:   Monitor BMET and UOP Hold Lasix another day Free water  GASTROINTESTINAL A:   Tolerating  TF P:   Pepcid for stress ulcer prophylaxis Tube feedings per protocol Bowel regimen Enema may not be needed  HEMATOLOGIC A:   Normocytic anemia: Hb 9-10. Possibly dilutional. Leukocytosis: 12.1, down from 14.3 yesterday. Likely steroid-induced and possibly concentration. P:  Follow CBC Sub q hep  INFECTIOUS A:   Influenza B (by report, can't see lab result from outside hospital) with possible bacterial superinfection: s/p Tamiflu x 5 days P:   Complete 7-day course of ceftriaxone today  ENDOCRINE A:   Hyperglycemia Steroids in use P:   Monitor glucose Reduce steroids  NEUROLOGIC A:   Sedation needs for vent synchrony Depression, anxiety P:   RASS goal: 0 PAD protocol: fentanyl gtt and prn versed and clonazepam    Charlott Rakes, PGY3 Internal Medicine Pager: [phone]  STAFF NOTE: Linwood Dibbles, MD FACP have personally reviewed patient's available data, including medical history, events of note, physical examination and test results as part of my evaluation. I have discussed with resident/NP and  other care providers such as pharmacist, RN and RRT. In addition, I personally evaluated patient and elicited key findings of: some agitation, not fc, lungs improved daily, less wheezing, abdo soft, some small BM reported, HTN noted, some improved renal fxn, weaning aggressive cpap 5 ps5, goal 30 min, prop with WUA, using short acting agents, control HTN, increase BB oral and IV, add d5w for NA and free water, get bme tin pm, correct renal fxn and na  Further hope will improve neurostatus, allow abx to dc, plat are wnl, hold TF for weaning, update family daily The patient is critically ill with multiple organ systems failure and requires high complexity decision making for assessment and support, frequent evaluation and titration of therapies, application of advanced monitoring technologies and extensive interpretation of multiple databases.   Critical Care Time devoted  to patient care services described in this note is30 Minutes. This time reflects time of care of this signee: Merrie Roof, MD FACP. This critical care time does not reflect procedure time, or teaching time or supervisory time of PA/NP/Med student/Med Resident etc but could involve care discussion time. Rest per NP/medical resident whose note is outlined above and that I agree with   Lavon Paganini. Titus Mould, MD, [place] Pgr: [place] [facility] 10/16/2016 8:33 AM

## 2016-10-16 NOTE — Progress Notes (Signed)
PT Cancellation Note  Patient Details Name: LILYIAN QUAYLE MRN: [id number] DOB: [birth year]   Cancelled Treatment:    Reason Eval/Treat Not Completed: Medical issues which prohibited therapy (pt remains intubated and agitated with sedation lifted and plans to extubate. RN states hold until next date. Will attempt next date as medically appropriate)   Lorretta Kerce B [place] 10/16/2016, 8:11 AM  [name], [place]

## 2016-10-17 ENCOUNTER — Inpatient Hospital Stay (HOSPITAL_COMMUNITY): Payer: BLUE CROSS/BLUE SHIELD

## 2016-10-17 LAB — CBC
HEMATOCRIT: 27.4 % — AB (ref 36.0–46.0)
Hemoglobin: 8.9 g/dL — ABNORMAL LOW (ref 12.0–15.0)
MCH: 27.9 pg (ref 26.0–34.0)
MCHC: 32.5 g/dL (ref 30.0–36.0)
MCV: 85.9 fL (ref 78.0–100.0)
Platelets: 577 10*3/uL — ABNORMAL HIGH (ref 150–400)
RBC: 3.19 MIL/uL — AB (ref 3.87–5.11)
RDW: 14.2 % (ref 11.5–15.5)
WBC: 14.9 10*3/uL — ABNORMAL HIGH (ref 4.0–10.5)

## 2016-10-17 LAB — GLUCOSE, CAPILLARY
GLUCOSE-CAPILLARY: 168 mg/dL — AB (ref 65–99)
GLUCOSE-CAPILLARY: 152 mg/dL — AB (ref 65–99)
GLUCOSE-CAPILLARY: 132 mg/dL — AB (ref 65–99)
GLUCOSE-CAPILLARY: 144 mg/dL — AB (ref 65–99)
Glucose-Capillary: 117 mg/dL — ABNORMAL HIGH (ref 65–99)
Glucose-Capillary: 144 mg/dL — ABNORMAL HIGH (ref 65–99)

## 2016-10-17 LAB — LACTATE DEHYDROGENASE: LDH: 326 U/L — AB (ref 98–192)

## 2016-10-17 LAB — MAGNESIUM: Magnesium: 2.6 mg/dL — ABNORMAL HIGH (ref 1.7–2.4)

## 2016-10-17 LAB — POCT I-STAT 3, ART BLOOD GAS (G3+)
ACID-BASE EXCESS: 8 mmol/L — AB (ref 0.0–2.0)
Acid-Base Excess: 10 mmol/L — ABNORMAL HIGH (ref 0.0–2.0)
Bicarbonate: 32.4 mmol/L — ABNORMAL HIGH (ref 20.0–28.0)
Bicarbonate: 34.9 mmol/L — ABNORMAL HIGH (ref 20.0–28.0)
O2 SAT: 91 %
O2 SAT: 100 %
PCO2 ART: 51 mmHg — AB (ref 32.0–48.0)
PO2 ART: 56 mmHg — AB (ref 83.0–108.0)
PO2 ART: 169 mmHg — AB (ref 83.0–108.0)
TCO2: 34 mmol/L (ref 0–100)
TCO2: 36 mmol/L (ref 0–100)
pCO2 arterial: 42.3 mmHg (ref 32.0–48.0)
pH, Arterial: 7.491 — ABNORMAL HIGH (ref 7.350–7.450)
pH, Arterial: 7.443 (ref 7.350–7.450)

## 2016-10-17 LAB — BASIC METABOLIC PANEL
Anion gap: 13 (ref 5–15)
Anion gap: 9 (ref 5–15)
BUN: 97 mg/dL — ABNORMAL HIGH (ref 6–20)
BUN: 86 mg/dL — AB (ref 6–20)
CALCIUM: 9.1 mg/dL (ref 8.9–10.3)
CHLORIDE: 106 mmol/L (ref 101–111)
CO2: 30 mmol/L (ref 22–32)
CO2: 32 mmol/L (ref 22–32)
CREATININE: 1.66 mg/dL — AB (ref 0.44–1.00)
Calcium: 9.2 mg/dL (ref 8.9–10.3)
Chloride: 105 mmol/L (ref 101–111)
Creatinine, Ser: 1.92 mg/dL — ABNORMAL HIGH (ref 0.44–1.00)
GFR calc Af Amer: 33 mL/min — ABNORMAL LOW (ref >60)
GFR calc Af Amer: 39 mL/min — ABNORMAL LOW (ref >60)
GFR calc non Af Amer: 28 mL/min — ABNORMAL LOW (ref >60)
GFR calc non Af Amer: 34 mL/min — ABNORMAL LOW (ref >60)
GLUCOSE: 157 mg/dL — AB (ref 65–99)
Glucose, Bld: 182 mg/dL — ABNORMAL HIGH (ref 65–99)
POTASSIUM: 4.1 mmol/L (ref 3.5–5.1)
Potassium: 3.6 mmol/L (ref 3.5–5.1)
Sodium: 149 mmol/L — ABNORMAL HIGH (ref 135–145)
Sodium: 146 mmol/L — ABNORMAL HIGH (ref 135–145)

## 2016-10-17 LAB — LACTIC ACID, PLASMA: LACTIC ACID, VENOUS: 1.3 mmol/L (ref 0.5–1.9)

## 2016-10-17 LAB — PHOSPHORUS: Phosphorus: 3.8 mg/dL (ref 2.5–4.6)

## 2016-10-17 LAB — TRIGLYCERIDES: TRIGLYCERIDES: 285 mg/dL — AB (ref <150)

## 2016-10-17 MED ORDER — LABETALOL HCL 5 MG/ML IV SOLN
20.0000 mg | INTRAVENOUS | Status: DC
  Administered 2016-10-17 – 2016-10-23 (×21): 20 mg via INTRAVENOUS
  Filled 2016-10-17 (×38): qty 4

## 2016-10-17 MED ORDER — BISACODYL 10 MG RE SUPP
10.0000 mg | Freq: Once | RECTAL | Status: AC
  Administered 2016-10-17: 10 mg via RECTAL
  Filled 2016-10-17: qty 1

## 2016-10-17 MED ORDER — PROPOFOL 1000 MG/100ML IV EMUL
5.0000 ug/kg/min | INTRAVENOUS | Status: DC
  Administered 2016-10-17: 35 ug/kg/min via INTRAVENOUS
  Administered 2016-10-17: 20 ug/kg/min via INTRAVENOUS
  Administered 2016-10-18 (×5): 35 ug/kg/min via INTRAVENOUS
  Administered 2016-10-19: 30 ug/kg/min via INTRAVENOUS
  Administered 2016-10-19 – 2016-10-20 (×3): 35 ug/kg/min via INTRAVENOUS
  Filled 2016-10-17 (×11): qty 100

## 2016-10-17 MED ORDER — HYDRALAZINE HCL 20 MG/ML IJ SOLN
20.0000 mg | INTRAMUSCULAR | Status: DC | PRN
  Administered 2016-10-21 (×2): 20 mg via INTRAVENOUS
  Filled 2016-10-17 (×3): qty 1

## 2016-10-17 MED ORDER — FUROSEMIDE 10 MG/ML IJ SOLN
INTRAMUSCULAR | Status: AC
  Administered 2016-10-17: 60 mg
  Filled 2016-10-17: qty 4

## 2016-10-17 MED ORDER — WHITE PETROLATUM GEL
Status: DC | PRN
  Administered 2016-10-17: 19:00:00 via TOPICAL
  Filled 2016-10-17: qty 1

## 2016-10-17 MED ORDER — HYDRALAZINE HCL 20 MG/ML IJ SOLN
10.0000 mg | INTRAMUSCULAR | Status: DC | PRN
  Administered 2016-10-17: 10 mg via INTRAVENOUS
  Filled 2016-10-17: qty 1

## 2016-10-17 MED ORDER — METOPROLOL TARTRATE 25 MG/10 ML ORAL SUSPENSION: 100.0000 mg | Freq: Two times a day (BID) | ORAL | Status: DC

## 2016-10-17 MED ORDER — FLEET ENEMA 7-19 GM/118ML RE ENEM
1.0000 | ENEMA | Freq: Once | RECTAL | Status: DC
  Filled 2016-10-17: qty 1

## 2016-10-17 MED ORDER — METOPROLOL TARTRATE 5 MG/5ML IV SOLN
INTRAVENOUS | Status: AC
  Administered 2016-10-17: 09:00:00
  Filled 2016-10-17: qty 5

## 2016-10-17 MED ORDER — FUROSEMIDE 10 MG/ML IJ SOLN
INTRAMUSCULAR | Status: AC
  Filled 2016-10-17: qty 2

## 2016-10-17 MED ORDER — FREE WATER
300.0000 mL | Freq: Four times a day (QID) | Status: DC
  Administered 2016-10-17 – 2016-10-18 (×4): 300 mL

## 2016-10-17 MED ORDER — FENTANYL CITRATE (PF) 100 MCG/2ML IJ SOLN
INTRAMUSCULAR | Status: AC
  Administered 2016-10-17: 100 ug
  Filled 2016-10-17: qty 4

## 2016-10-17 MED ORDER — METHYLPREDNISOLONE SODIUM SUCC 40 MG IJ SOLR
20.0000 mg | Freq: Every day | INTRAMUSCULAR | Status: DC
  Administered 2016-10-17 – 2016-10-21 (×5): 20 mg via INTRAVENOUS
  Filled 2016-10-17 (×5): qty 0.5

## 2016-10-17 MED ORDER — PROPOFOL 10 MG/ML IV BOLUS
INTRAVENOUS | Status: AC
  Administered 2016-10-17: 15:00:00
  Filled 2016-10-17: qty 20

## 2016-10-17 MED ORDER — PROPOFOL 1000 MG/100ML IV EMUL
INTRAVENOUS | Status: AC
  Administered 2016-10-17: 15:00:00
  Filled 2016-10-17: qty 100

## 2016-10-17 NOTE — Progress Notes (Signed)
PT Cancellation Note  Patient Details Name: Rachel [name] MRN: [id number] DOB: 01-Jun-1961   Cancelled Treatment:    Reason Eval/Treat Not Completed: Medical issues which prohibited therapy (spoke with nsg, pt just extubated, some increased WOB, hold PT today and follow for mobility evaluation next day)   Duncan Dull 10/17/2016, 11:09 AM

## 2016-10-17 NOTE — Progress Notes (Signed)
Pt vomited tube feed, pt's tube feed placed on hold. Residual checked results showed 275ml's. Informed Dr. Jacinto Reap. Molt. Pt's tube feed still on hold.

## 2016-10-17 NOTE — Progress Notes (Signed)
Pt placed on 100% NRB mask due to ABG done with PaO2 of 57.  Pt is tolerating well, O2 sats 96%. RT will continue to monitor.

## 2016-10-17 NOTE — Procedures (Signed)
Intubation Procedure Note Rachel [name] [id number] 06/05/1961  Procedure: Intubation Indications: Respiratory insufficiency  Procedure Details Consent: Unable to obtain consent because of emergent medical necessity. Time Out: Verified patient identification, verified procedure, site/side was marked, verified correct patient position, special equipment/implants available, medications/allergies/relevent history reviewed, required imaging and test results available.  Performed  Maximum sterile technique was used including cap, gloves and hand hygiene.  MAC and 4    Evaluation Hemodynamic Status: BP stable throughout; O2 sats: stable throughout Patient's Current Condition: stable Complications: No apparent complications Patient did tolerate procedure well. Chest X-ray ordered to verify placement.  CXR: pending.   Rachel [name] 10/17/2016  No trauma by me She may have bitten her tongue   Lavon Paganini. Titus Mould, MD, [place] Pgr: [place] [facility]

## 2016-10-17 NOTE — Progress Notes (Signed)
c/o abdominal pain, denies nausea, bowel sounds hypoactive. Dr. Titus Mould notified, dulcolax supp ordered

## 2016-10-17 NOTE — Progress Notes (Signed)
PULMONARY / CRITICAL CARE MEDICINE   Name: Rachel [name]  MRN: [id number] DOB: [birth year]    ADMISSION DATE:  10/10/2016 CONSULTATION DATE:  10/17/2016   REFERRING MD:  Alean Rinne from [place]:  Respiratory failure   BRIEF:  56 y/o female with emphysema admitted with Influenza B pneumonia/ARDS.   SUBJECTIVE: weaning, min fc, agitation  VITAL SIGNS: BP (!) 188/85   Pulse 74   Temp 97.7 F (36.5 C) (Oral)   Resp (!) 28   Ht 5\' 2"  (1.575 m)   Wt 91.2 kg (201 lb 1 oz)   SpO2 93%   BMI 36.77 kg/m   HEMODYNAMICS: CVP:  [8 mmHg-12 mmHg] 10 mmHg  VENTILATOR SETTINGS: Vent Mode: CPAP;PSV FiO2 (%):  [40 %] 40 % Set Rate:  [28 bmp] 28 bmp Vt Set:  [370 mL] 370 mL PEEP:  [5 cmH20] 5 cmH20 Pressure Support:  [5 cmH20-10 cmH20] 10 cmH20 Plateau Pressure:  [25 cmH20-28 cmH20] 27 cmH20  INTAKE / OUTPUT: I/O last 3 completed shifts: In: 5527.5 [I.V.:2447.5; NG/GT:3030; IV Piggyback:50] Out: 2683 [MHDQQ:2297; Stool:300]  PHYSICAL EXAMINATION: Physical Exam  Constitutional: No distress.  HENT:  Head: Normocephalic and atraumatic.  ETT in place  Eyes: Pupils are equal, round, and reactive to light.  Neck: No JVD present.  L IJ in place  Cardiovascular: Regular rhythm and intact distal pulses.   No murmur heard. Tachycardic  Pulmonary/Chest: Effort normal. She has no wheezes. She has no rales.  Abdominal: There is no tenderness. There is no rebound and no guarding.  Musculoskeletal: She exhibits edema.  Skin: Skin is warm and dry.   rass -1  LABS:  BMET  Recent Labs Lab 10/16/16 0322 10/16/16 1600 10/17/16 0422  NA 147* 152* 149*  K 5.2* 4.4 4.1  CL 106 109 106  CO2 30 29 30   BUN 129* 113* 97*  CREATININE 2.67* 2.35* 1.92*  GLUCOSE 148* 163* 182*    Electrolytes  Recent Labs Lab 10/15/16 0750 10/16/16 0322 10/16/16 1600 10/17/16 0422  CALCIUM 9.2 9.3 9.4 9.2  MG 3.1* 3.1*  --  2.6*  PHOS 5.5* 3.2  --  3.8     CBC  Recent Labs Lab 10/15/16 0750 10/16/16 0322 10/17/16 0422  WBC 14.3* 12.1* 14.9*  HGB 9.4* 9.5* 8.9*  HCT 27.7* 28.3* 27.4*  PLT 535* 580* 577*    Coag's No results for input(s): APTT, INR in the last 168 hours.  Sepsis Markers  Recent Labs Lab 10/10/16 1507  LATICACIDVEN 1.3  PROCALCITON 0.31    ABG  Recent Labs Lab 10/13/16 0005 10/13/16 0257 10/14/16 0330  PHART 7.504* 7.337* 7.376  PCO2ART 27.1* 42.0 43.8  PO2ART 140.0* 87.0 67.1*    Liver Enzymes  Recent Labs Lab 10/10/16 1507  AST 47*  ALT 14  ALKPHOS 59  BILITOT 0.4  ALBUMIN 3.0*    Cardiac Enzymes  Recent Labs Lab 10/10/16 1507 10/10/16 2106 10/11/16 0202  TROPONINI 0.04* <0.03 0.13*    Glucose  Recent Labs Lab 10/16/16 1139 10/16/16 1542 10/16/16 1932 10/16/16 2343 10/17/16 0354 10/17/16 0754  GLUCAP 141* 136* 170* 150* 168* 152*    Imaging No results found.  STUDIES:  CXR 4/16: Right perihilar and left basilar infiltration or consolidation. CXR 4/17 and 4/18 - patchy b/l infiltrates CXR 4/19 persistent b/l infiltrates Ab XR 4/19 NG tube tip in distal stomach   CULTURES: 4/14 resp culture > normal flora MRSA nasal swab neg. 4/14 urine culture >>negative. 4/14 blood  culture  >> negative 4/14 influenza pcr panel > POSITIVE INFLUENZA B 4/14 urine strep > neg. 4/14 urine leg >    ANTIBIOTICS: 4/13 levaquin > 4/14 4/14 Azithromycin > 4/16 4/14 Vancomycin > 4/17 4/13 Tamiflu > 4/18 4/14 Ceftriaxone > 4/20   SIGNIFICANT EVENTS: 4/13 admission [place] 4/14 intubation, transfer to [facility] 4/15 - remains intubated 4/17 - on vent, improving, diuresed well   LINES/TUBES: 4/14 ETT >  [facility] CVL PIV   DISCUSSION: 56 y/o female with a past medical history of presumed COPD based on her smoking history who is here now with acute respiratory failure with hypoxemia secondary to influenza B pneumonia.    ASSESSMENT / PLAN:  PULMONARY A: Acute  respiratory failure with hypoxemia from influenza and super bacterial infection  With ARDS Presumed COPD with wheezing improving with bronchodilators and steroid  Tobacco abuse P:   Wean cpap 5 ps 5, goal 30 min  Even to pos balance okay Improve neuro as able, Na, crt, bun chair position  CARDIOVASCULAR A:  Demand ischemia Septic shock with Hypotension requiring pressor - off pressor 7/59 Grd 2 diastolic dysfunction Hypertension: BP 100-175/80s. P:  Continue metoprolol to increase labetalol prn to 20  RENAL A:   AKI, likely from ATN, improved hypovolemia P:   No lasix, we are seeing improved bun crt  Free water keep to 300 d5w to 100 bemt in pm and am   GASTROINTESTINAL A:   Tolerating TF Vomiting x 1 P:   Pepcid for stress ulcer prophylaxis TF off May need reglan  HEMATOLOGIC A:   Normocytic anemia: Hb 9-10. Possibly dilutional. Leukocytosis: 12.1, down from 14.3 yesterday. Likely steroid-induced and possibly concentration. P:  Sub q hep  INFECTIOUS A:   Influenza B (by report, can't see lab result from outside hospital) with possible bacterial superinfection: s/p Tamiflu x 5 days P:   Complete completed  ENDOCRINE A:   Hyperglycemia Steroids in use P:   Monitor glucose Reduce steroids further  NEUROLOGIC A:   Sedation needs for vent synchrony Depression, anxiety P:   RASS goal: 0 PAD precedex And home clonazepam  Need full WUA  Ccm time 30 min   Lavon Paganini. Titus Mould, MD, [place] Pgr: [place] [facility] 10/17/2016 8:05 AM

## 2016-10-17 NOTE — Procedures (Signed)
Extubation Procedure Note  Patient Details:   Name: Rachel [name] DOB: 06-Sep-1960 MRN: [id number]   Airway Documentation:  Airway 7.5 mm (Active)  Secured at (cm) 22 cm 10/17/2016  7:50 AM  Measured From Lips 10/17/2016  7:50 AM  Secured Location Right 10/17/2016  7:50 AM  Secured By [employer] 10/17/2016  7:50 AM  Tube Holder Repositioned Yes 10/17/2016  7:50 AM  Cuff Pressure (cm H2O) 28 cm H2O 10/16/2016  3:57 PM  Site Condition Dry 10/17/2016  7:50 AM    Evaluation  O2 sats: stable throughout Complications: No apparent complications Patient did tolerate procedure well. Bilateral Breath Sounds: Diminished, Expiratory wheezes   Yes   Pt extubated per MD order.  Pt tolerated well, Pt placed on 5L [place] with sats of 92-93%.  RT will continue to monitor.  Pierre Bali 10/17/2016, 0930

## 2016-10-17 NOTE — Progress Notes (Signed)
Increasing WOB into the mid 40's using accessory muscle,BP 180's/100 , very agitated> Dr Titus Mould notified. Patient also c/o of abdominal pain>md aware.

## 2016-10-18 ENCOUNTER — Encounter (HOSPITAL_COMMUNITY): Payer: Self-pay | Admitting: Radiology

## 2016-10-18 ENCOUNTER — Inpatient Hospital Stay (HOSPITAL_COMMUNITY): Payer: BLUE CROSS/BLUE SHIELD

## 2016-10-18 LAB — GLUCOSE, CAPILLARY
GLUCOSE-CAPILLARY: 101 mg/dL — AB (ref 65–99)
GLUCOSE-CAPILLARY: 112 mg/dL — AB (ref 65–99)
GLUCOSE-CAPILLARY: 122 mg/dL — AB (ref 65–99)
GLUCOSE-CAPILLARY: 127 mg/dL — AB (ref 65–99)
GLUCOSE-CAPILLARY: 139 mg/dL — AB (ref 65–99)
Glucose-Capillary: 132 mg/dL — ABNORMAL HIGH (ref 65–99)

## 2016-10-18 LAB — BASIC METABOLIC PANEL
Anion gap: 11 (ref 5–15)
BUN: 69 mg/dL — AB (ref 6–20)
CHLORIDE: 103 mmol/L (ref 101–111)
CO2: 31 mmol/L (ref 22–32)
CREATININE: 1.49 mg/dL — AB (ref 0.44–1.00)
Calcium: 9.1 mg/dL (ref 8.9–10.3)
GFR calc Af Amer: 45 mL/min — ABNORMAL LOW (ref >60)
GFR calc non Af Amer: 39 mL/min — ABNORMAL LOW (ref >60)
GLUCOSE: 135 mg/dL — AB (ref 65–99)
POTASSIUM: 3.2 mmol/L — AB (ref 3.5–5.1)
Sodium: 145 mmol/L (ref 135–145)

## 2016-10-18 LAB — HEPATIC FUNCTION PANEL
ALT: 34 U/L (ref 14–54)
AST: 26 U/L (ref 15–41)
Albumin: 2.5 g/dL — ABNORMAL LOW (ref 3.5–5.0)
Alkaline Phosphatase: 83 U/L (ref 38–126)
BILIRUBIN INDIRECT: 0.4 mg/dL (ref 0.3–0.9)
Bilirubin, Direct: 0.1 mg/dL (ref 0.1–0.5)
TOTAL PROTEIN: 6.1 g/dL — AB (ref 6.5–8.1)
Total Bilirubin: 0.5 mg/dL (ref 0.3–1.2)

## 2016-10-18 LAB — CBC
HEMATOCRIT: 27.3 % — AB (ref 36.0–46.0)
Hemoglobin: 8.7 g/dL — ABNORMAL LOW (ref 12.0–15.0)
MCH: 27.6 pg (ref 26.0–34.0)
MCHC: 31.9 g/dL (ref 30.0–36.0)
MCV: 86.7 fL (ref 78.0–100.0)
PLATELETS: 586 10*3/uL — AB (ref 150–400)
RBC: 3.15 MIL/uL — ABNORMAL LOW (ref 3.87–5.11)
RDW: 14.4 % (ref 11.5–15.5)
WBC: 18.7 10*3/uL — AB (ref 4.0–10.5)

## 2016-10-18 MED ORDER — FAMOTIDINE IN NACL 20-0.9 MG/50ML-% IV SOLN
20.0000 mg | Freq: Two times a day (BID) | INTRAVENOUS | Status: DC
  Administered 2016-10-18 – 2016-10-22 (×10): 20 mg via INTRAVENOUS
  Filled 2016-10-18 (×11): qty 50

## 2016-10-18 MED ORDER — IOPAMIDOL (ISOVUE-300) INJECTION 61%
INTRAVENOUS | Status: AC
  Administered 2016-10-18: 30 mL
  Filled 2016-10-18: qty 30

## 2016-10-18 MED ORDER — CLONAZEPAM 1 MG PO TABS
1.0000 mg | ORAL_TABLET | Freq: Two times a day (BID) | ORAL | Status: DC
  Administered 2016-10-18 – 2016-10-19 (×2): 1 mg via ORAL
  Filled 2016-10-18 (×2): qty 1

## 2016-10-18 MED ORDER — SODIUM CHLORIDE 0.9 % IV SOLN
30.0000 meq | Freq: Once | INTRAVENOUS | Status: AC
  Administered 2016-10-18: 30 meq via INTRAVENOUS
  Filled 2016-10-18: qty 15

## 2016-10-18 NOTE — Progress Notes (Addendum)
PULMONARY / CRITICAL CARE MEDICINE   Name: Rachel [name]  MRN: [id number] DOB: 04/11/1961    ADMISSION DATE:  10/10/2016 CONSULTATION DATE:  10/18/2016   REFERRING MD:  Alean Rinne from [place]:  Respiratory failure   BRIEF:  56 y/o female with emphysema admitted with Influenza B pneumonia/ARDS.  SUBJECTIVE: She was reintubated yesterday. Overnight, she required suctioning of her ETT tube. This morning, she is awake and gestures to her epigastric area when asked about pain.  VITAL SIGNS: BP 120/62   Pulse 74   Temp 98.6 F (37 C) (Oral)   Resp (!) 28   Ht 5\' 2"  (1.575 m)   Wt 202 lb 6.1 oz (91.8 kg)   SpO2 97%   BMI 37.02 kg/m   HEMODYNAMICS: CVP:  [10 mmHg-11 mmHg] 10 mmHg  VENTILATOR SETTINGS: Vent Mode: PRVC FiO2 (%):  [40 %-100 %] 50 % Set Rate:  [28 bmp] 28 bmp Vt Set:  [370 mL] 370 mL PEEP:  [5 cmH20] 5 cmH20 Pressure Support:  [10 cmH20] 10 cmH20 Plateau Pressure:  [20 cmH20-27 cmH20] 27 cmH20  INTAKE / OUTPUT: I/O last 3 completed shifts: In: 5355.9 [I.V.:3800.9; NG/GT:1555] Out: 8546 [EVOJJ:0093; Stool:301]  PHYSICAL EXAMINATION: Physical Exam  Constitutional: No distress.  HENT:  Head: Normocephalic and atraumatic.  ETT in place  Eyes: Pupils are equal, round, and reactive to light.  Neck: No JVD present.  L IJ in place  Cardiovascular: Regular rhythm and intact distal pulses.   No murmur heard. Tachycardic  Pulmonary/Chest: Effort normal. She has no wheezes.  Abdominal: Soft. Bowel sounds are normal. There is tenderness (Mid-epigastric area).  Musculoskeletal: She exhibits edema.  Skin: Skin is warm and dry.   LABS:  BMET  Recent Labs Lab 10/17/16 0422 10/17/16 1745 10/18/16 0350  NA 149* 146* 145  K 4.1 3.6 3.2*  CL 106 105 103  CO2 30 32 31  BUN 97* 86* 69*  CREATININE 1.92* 1.66* 1.49*  GLUCOSE 182* 157* 135*    Electrolytes  Recent Labs Lab 10/15/16 0750 10/16/16 0322  10/17/16 0422  10/17/16 1745 10/18/16 0350  CALCIUM 9.2 9.3  < > 9.2 9.1 9.1  MG 3.1* 3.1*  --  2.6*  --   --   PHOS 5.5* 3.2  --  3.8  --   --   < > = values in this interval not displayed.  CBC  Recent Labs Lab 10/16/16 0322 10/17/16 0422 10/18/16 0350  WBC 12.1* 14.9* 18.7*  HGB 9.5* 8.9* 8.7*  HCT 28.3* 27.4* 27.3*  PLT 580* 577* 586*    Coag's No results for input(s): APTT, INR in the last 168 hours.  Sepsis Markers  Recent Labs Lab 10/17/16 1526  LATICACIDVEN 1.3    ABG  Recent Labs Lab 10/14/16 0330 10/17/16 1304 10/17/16 1559  PHART 7.376 7.491* 7.443  PCO2ART 43.8 42.3 51.0*  PO2ART 67.1* 56.0* 169.0*    Glucose  Recent Labs Lab 10/17/16 0754 10/17/16 1200 10/17/16 1551 10/17/16 2020 10/17/16 2337 10/18/16 0350  GLUCAP 152* 132* 144* 144* 117* 132*    Imaging Dg Chest Port 1 View  Result Date: 10/17/2016 CLINICAL DATA:  Re-intubation. EXAM: PORTABLE CHEST 1 VIEW COMPARISON:  Chest radiograph 10/17/2016 FINDINGS: Left IJ central venous catheter tip projects over the superior vena cava. ET tube terminates in the mid trachea. Left lateral hemithorax excluded from view. Stable cardiac and mediastinal contours. Low lung volumes. No large area pulmonary consolidation. Possible small left pleural effusion. IMPRESSION:  ET tube terminates in the mid trachea. Low lung volumes, basilar atelectasis and possible small left effusion. Electronically Signed   By: Lovey Newcomer M.D.   On: 10/17/2016 15:02   Dg Chest Port 1 View  Result Date: 10/17/2016 CLINICAL DATA:  57 year old female with history of shortness of breath. EXAM: PORTABLE CHEST 1 VIEW COMPARISON:  Chest x-ray 10/15/2016. FINDINGS: Patient has been extubated. Previously noted left IJ central venous catheter remains in position with tip in the superior cavoatrial junction. Lung volumes are low. Bibasilar opacities favored to reflect areas of subsegmental atelectasis. Probable small left pleural effusion. Crowding  of the pulmonary vasculature, accentuated by low lung volumes, without frank pulmonary edema. Heart size is borderline enlarged, also eccentric to by low lung volumes. Upper mediastinal contours are within normal limits. IMPRESSION: 1. Support apparatus, as above. 2. Low lung volumes with probable bibasilar subsegmental atelectasis and small left pleural effusion. Electronically Signed   By: Vinnie Langton M.D.   On: 10/17/2016 13:29   Dg Abd Portable 1v  Result Date: 10/17/2016 CLINICAL DATA:  OG tube placement. EXAM: PORTABLE ABDOMEN - 1 VIEW COMPARISON:  10/15/2016 FINDINGS: Orogastric tube passes well below the diaphragm with its tip projecting in the expected location of the distal stomach. Tip appears further inserted than on the prior exam. IMPRESSION: OG tube tip projects in the distal stomach. Electronically Signed   By: Lajean Manes M.D.   On: 10/17/2016 15:55    STUDIES:  CXR 4/16: Right perihilar and left basilar infiltration or consolidation. CXR 4/17 and 4/18 - patchy b/l infiltrates CXR 4/19 persistent b/l infiltrates Ab XR 4/19 NG tube tip in distal stomach Ab XR 4/22 NG tube tip in distal stomach   CULTURES: 4/14 resp culture > normal flora MRSA nasal swab neg. 4/14 urine culture >>negative. 4/14 blood culture  >> negative 4/14 influenza pcr panel > POSITIVE INFLUENZA B 4/14 urine strep > neg. 4/14 urine leg >    ANTIBIOTICS: 4/13 levaquin > 4/14 4/14 Azithromycin > 4/16 4/14 Vancomycin > 4/17 4/13 Tamiflu > 4/18 4/14 Ceftriaxone > 4/20   SIGNIFICANT EVENTS: 4/13 admission [place] 4/14 intubation, transfer to [facility] 4/15 - remains intubated 4/17 - on vent, improving, diuresed well  4/21 extubated but reintubated ( agitation, psych?)   LINES/TUBES: 4/14 ETT > 4/21 4/21 ETT > [facility] CVL PIV   DISCUSSION: 56 y/o female with a past medical history of presumed COPD based on her smoking history who is here now with acute respiratory failure with  hypoxemia secondary to influenza B pneumonia.    ASSESSMENT / PLAN:  PULMONARY A: Acute respiratory failure with hypoxemia from influenza and super bacterial infection  With ARDS Presumed COPD with wheezing improving with bronchodilators and steroid  Tobacco abuse P:   Attempt weaning today   CARDIOVASCULAR A:  Demand ischemia Septic shock with Hypotension requiring pressor - off pressor 0/86 Grd 2 diastolic dysfunction Hypertension: BP mostly 110s-130s/60s-70s. P:  Continue labetatol 20 mg IV and hydralazine 20 mg as needed   RENAL A:   AKI, likely from ATN, improved hypovolemia. BUN/Crt continues to down trend. Hypokalemia: Suspect from vomiting and insensible losses. P:   Continue free water at 300 cc every 6 hours Continue D5W to lower rate  GASTROINTESTINAL A:   Epigastric abdominal pain: Possibly GERD related. No RUQ tenderness to suggest biliary process. LFTs normal on admission. P:   Hold tube feeds Increase famotidine to 20 mg twice daily for GERD Check LFTs  HEMATOLOGIC A:  Normocytic anemia: Hb 9-10 though now trending 8 over last two days. No signs of overt blood loss other than bloody sputum described above. Leukocytosis: 18.7, up from 14.9 yesterday. P:  Sub q hep   INFECTIOUS A:   Influenza B (by report, can't see lab result from outside hospital) with possible bacterial superinfection: s/p antimicrobial therapy P:   Continue assessing   ENDOCRINE A:   Hyperglycemia Steroids in use P:   Monitor glucose Reduce steroids further   NEUROLOGIC A:   Sedation needs for vent synchrony Depression, anxiety P:   RASS goal: 0 Continue Precedex gtt and home clonazepam Daily wake up assessment  Charlott Rakes, PGY3 Internal Medicine Pager: [phone]  STAFF NOTE: Linwood Dibbles, MD FACP have personally reviewed patient's available data, including medical history, events of note, physical examination and test results as part of my  evaluation. I have discussed with resident/NP and other care providers such as pharmacist, RN and RRT. In addition, I personally evaluated patient and elicited key findings of: awake, alert, follows commands, complains of Abdo pain, lft wnl, lactic, ldh wnl, reintubation need is not clear to me, abdo? Psych / anxiety?, for CT abdo oral contrast only, wean without extubation today, would NOT trach, would get another shot at successfully extubation in future, pcxr in am , na is corrected, reduce d5w, bmet in am, upright, concentrate on home regimen for anxiety, renal fxn continued to improve The patient is critically ill with multiple organ systems failure and requires high complexity decision making for assessment and support, frequent evaluation and titration of therapies, application of advanced monitoring technologies and extensive interpretation of multiple databases.   Critical Care Time devoted to patient care services described in this note is 30 Minutes. This time reflects time of care of this signee: Merrie Roof, MD FACP. This critical care time does not reflect procedure time, or teaching time or supervisory time of PA/NP/Med student/Med Resident etc but could involve care discussion time. Rest per NP/medical resident whose note is outlined above and that I agree with   Lavon Paganini. Titus Mould, MD, [place] Pgr: [facility] 10/18/2016 10:12 AM

## 2016-10-18 NOTE — Progress Notes (Signed)
PT Cancellation Note  Patient Details Name: Rachel [name] MRN: [id number] DOB: 1961-06-21   Cancelled Treatment:    Reason Eval/Treat Not Completed: Medical issues which prohibited therapy   Noted pt extubated yesterday, then had incr work of breathing and incr distress post extubation;  Reintubated in the afternoon;   Will check back tomorrow for appropriateness of PT eval;   Roney Marion, [place] Pager [phone] Office [phone]    Colletta [place] 10/18/2016, 7:20 AM

## 2016-10-18 NOTE — Progress Notes (Signed)
Day 8>Droplet precaution dcd. Patient afibrile and she received flu meds.

## 2016-10-19 ENCOUNTER — Inpatient Hospital Stay (HOSPITAL_COMMUNITY): Payer: BLUE CROSS/BLUE SHIELD

## 2016-10-19 LAB — GLUCOSE, CAPILLARY
GLUCOSE-CAPILLARY: 113 mg/dL — AB (ref 65–99)
GLUCOSE-CAPILLARY: 113 mg/dL — AB (ref 65–99)
Glucose-Capillary: 102 mg/dL — ABNORMAL HIGH (ref 65–99)
Glucose-Capillary: 111 mg/dL — ABNORMAL HIGH (ref 65–99)
Glucose-Capillary: 116 mg/dL — ABNORMAL HIGH (ref 65–99)
Glucose-Capillary: 117 mg/dL — ABNORMAL HIGH (ref 65–99)

## 2016-10-19 LAB — CBC
HEMATOCRIT: 26.6 % — AB (ref 36.0–46.0)
Hemoglobin: 8.5 g/dL — ABNORMAL LOW (ref 12.0–15.0)
MCH: 27.5 pg (ref 26.0–34.0)
MCHC: 32 g/dL (ref 30.0–36.0)
MCV: 86.1 fL (ref 78.0–100.0)
Platelets: 578 10*3/uL — ABNORMAL HIGH (ref 150–400)
RBC: 3.09 MIL/uL — ABNORMAL LOW (ref 3.87–5.11)
RDW: 14.3 % (ref 11.5–15.5)
WBC: 18.5 10*3/uL — ABNORMAL HIGH (ref 4.0–10.5)

## 2016-10-19 LAB — BASIC METABOLIC PANEL
Anion gap: 8 (ref 5–15)
BUN: 42 mg/dL — AB (ref 6–20)
CO2: 29 mmol/L (ref 22–32)
Calcium: 8.7 mg/dL — ABNORMAL LOW (ref 8.9–10.3)
Chloride: 102 mmol/L (ref 101–111)
Creatinine, Ser: 1.17 mg/dL — ABNORMAL HIGH (ref 0.44–1.00)
GFR calc Af Amer: >60 mL/min (ref >60)
GFR, EST NON AFRICAN AMERICAN: 52 mL/min — AB (ref >60)
GLUCOSE: 108 mg/dL — AB (ref 65–99)
POTASSIUM: 3.4 mmol/L — AB (ref 3.5–5.1)
Sodium: 139 mmol/L (ref 135–145)

## 2016-10-19 LAB — PROCALCITONIN: Procalcitonin: <0.1 ng/mL

## 2016-10-19 MED ORDER — SODIUM CHLORIDE 0.9 % IV SOLN
30.0000 meq | Freq: Once | INTRAVENOUS | Status: AC
  Administered 2016-10-19: 30 meq via INTRAVENOUS
  Filled 2016-10-19: qty 15

## 2016-10-19 MED ORDER — CLONAZEPAM 1 MG PO TABS
1.0000 mg | ORAL_TABLET | Freq: Three times a day (TID) | ORAL | Status: DC
  Administered 2016-10-19 – 2016-10-22 (×9): 1 mg via ORAL
  Filled 2016-10-19 (×9): qty 1

## 2016-10-19 MED ORDER — VITAL HIGH PROTEIN PO LIQD
1000.0000 mL | ORAL | Status: DC
  Administered 2016-10-19 – 2016-10-20 (×2): 1000 mL

## 2016-10-19 NOTE — Evaluation (Signed)
Physical Therapy Evaluation Patient Details Name: Rachel [name] MRN: [id number] DOB: May 06, 1961 Today's Date: 10/19/2016   History of Present Illness  56 yo admitted with respiratory failure and flu B. Pt intubated 4/14-4/21, reintubated 4/21. PMHx: emphysema  Clinical Impression  Rachel [name] is pleasant, attending to cues and tasks but with tendency to continue to [name] toward face and lines with mod cues to maintain hands down. Pt moving well considering prolonged bedrest and does demonstrate weakness, decreased ability with transfers and balance as well as cognitive deficits for safety who will benefit from acute therapy to maximize mobility, function, balance and gait to decrease caregiver burden. Pt with tendency for right lean in sitting EOB and in chair despite cueing and positioning with pillows. Pt sat EOB 5 min with minguard and cues prior to transfer to chair.   40% FiO2 on CPAP with RR up to 35 during mobility, sats 95% HR 71    Follow Up Recommendations LTACH;Supervision/Assistance - 24 hour    Equipment Recommendations  Rolling walker with 5" wheels    Recommendations for Other Services OT consult     Precautions / Restrictions Precautions Precautions: Fall Precaution Comments: vent, ETT, NGtube Restrictions Weight Bearing Restrictions: No      Mobility  Bed Mobility Overal bed mobility: Needs Assistance Bed Mobility: Supine to Sit     Supine to sit: HOB elevated;Min assist     General bed mobility comments: cues for sequence with HOB 35 degrees and increased time, min assist to scoot pelvis fully to EOB, pt able to elevate trunk without assist  Transfers Overall transfer level: Needs assistance   Transfers: Sit to/from Stand;Stand Pivot Transfers Sit to Stand: Min assist;+2 safety/equipment Stand pivot transfers: Mod assist;+2 safety/equipment;+2 physical assistance       General transfer comment: cues for safety and sequence with 2 person assist  for line management and balance. pt able to stand well from bed and chair, difficulty advancing feet stepping to chair with mod assist to control pelvis  Ambulation/Gait                Stairs            Wheelchair Mobility    Modified Rankin (Stroke Patients Only)       Balance Overall balance assessment: Needs assistance   Sitting balance-Leahy Scale: Good       Standing balance-Leahy Scale: Fair                               Pertinent Vitals/Pain Pain Assessment: No/denies pain    Home Living Family/patient expects to be discharged to:: Private residence Living Arrangements: Children Available Help at Discharge: Family Type of Home: House           Additional Comments: pt on vent and unable to provide complete home setup and children not present    Prior Function Level of Independence: Independent               Hand Dominance        Extremity/Trunk Assessment   Upper Extremity Assessment Upper Extremity Assessment: Generalized weakness    Lower Extremity Assessment Lower Extremity Assessment: Generalized weakness    Cervical / Trunk Assessment Cervical / Trunk Assessment: Kyphotic  Communication   Communication: Other (comment) (ETT, vent)  Cognition Arousal/Alertness: Awake/alert Behavior During Therapy: WFL for tasks assessed/performed Overall Cognitive Status: Difficult to assess  General Comments: pt continues to [name] for face despite cues to maintain hands down due to ETT      General Comments      Exercises     Assessment/Plan    PT Assessment Patient needs continued PT services  PT Problem List Decreased mobility;Decreased safety awareness;Decreased activity tolerance;Cardiopulmonary status limiting activity;Decreased knowledge of use of DME       PT Treatment Interventions Gait training;Therapeutic exercise;Patient/family education;Functional mobility  training;Stair training;Balance training;DME instruction;Therapeutic activities;Cognitive remediation    PT Goals (Current goals can be found in the Care Plan section)  Acute Rehab PT Goals Patient Stated Goal: walk  PT Goal Formulation: With patient Time For Goal Achievement: 11/02/16 Potential to Achieve Goals: Fair    Frequency Min 3X/week   Barriers to discharge Decreased caregiver support unclear what assist family can provide    Co-evaluation               End of Session Equipment Utilized During Treatment: Other (comment) (vent) Activity Tolerance: Patient tolerated treatment well Patient left: in chair;with call bell/phone within [name];with chair alarm set Nurse Communication: Mobility status;Precautions PT Visit Diagnosis: Other abnormalities of gait and mobility (R26.89);Difficulty in walking, not elsewhere classified (R26.2)    Time: 0370-4888 PT Time Calculation (min) (ACUTE ONLY): 29 min   Charges:   PT Evaluation $PT Eval High Complexity: 1 Procedure PT Treatments $Therapeutic Activity: 8-22 mins   PT G Codes:        Rachel [name], PT [phone]  Rachel [name] 10/19/2016, 10:21 AM

## 2016-10-19 NOTE — Progress Notes (Signed)
PULMONARY / CRITICAL CARE MEDICINE   Name: Rachel [name]  MRN: [id number] DOB: 10-Nov-1960    ADMISSION DATE:  10/10/2016 CONSULTATION DATE:  10/19/2016   REFERRING MD:  Alean Rinne from [place]:  Respiratory failure   BRIEF:  56 y/o female with emphysema admitted with Influenza B pneumonia/ARDS.  SUBJECTIVE: She was reintubated yesterday. Overnight, she required suctioning of her ETT tube. This morning, she is awake and gestures to her epigastric area when asked about pain.  VITAL SIGNS: BP 130/66   Pulse 72   Temp 97.8 F (36.6 C) (Oral)   Resp (!) 28   Ht 5\' 2"  (1.575 m)   Wt 206 lb 9.1 oz (93.7 kg)   SpO2 97%   BMI 37.78 kg/m   HEMODYNAMICS: CVP:  [7 mmHg-8 mmHg] 8 mmHg  VENTILATOR SETTINGS: Vent Mode: PRVC FiO2 (%):  [40 %] 40 % Set Rate:  [28 bmp] 28 bmp Vt Set:  [370 mL] 370 mL PEEP:  [5 cmH20] 5 cmH20 Plateau Pressure:  [20 cmH20-25 cmH20] 25 cmH20  INTAKE / OUTPUT: I/O last 3 completed shifts: In: 4223.3 [I.V.:3668.3; NG/GT:455; IV Piggyback:100] Out: 3015 [Urine:2765; Emesis/NG output:250]  PHYSICAL EXAMINATION: Physical Exam  Constitutional: No distress.  HENT:  Head: Normocephalic and atraumatic.  Eyes: Conjunctivae are normal. No scleral icterus.  Cardiovascular: Normal rate and regular rhythm.   Pulmonary/Chest: Effort normal. No respiratory distress.  Abdominal: Soft. She exhibits no distension. There is no tenderness.  Neurological: She is alert.  Opens/closes eyes on command, wiggles toes on command  Skin: She is not diaphoretic.    BMET  Recent Labs Lab 10/17/16 1745 10/18/16 0350 10/19/16 0333  NA 146* 145 139  K 3.6 3.2* 3.4*  CL 105 103 102  CO2 32 31 29  BUN 86* 69* 42*  CREATININE 1.66* 1.49* 1.17*  GLUCOSE 157* 135* 108*    Electrolytes  Recent Labs Lab 10/15/16 0750 10/16/16 0322  10/17/16 0422 10/17/16 1745 10/18/16 0350 10/19/16 0333  CALCIUM 9.2 9.3  < > 9.2 9.1 9.1 8.7*  MG 3.1*  3.1*  --  2.6*  --   --   --   PHOS 5.5* 3.2  --  3.8  --   --   --   < > = values in this interval not displayed.  CBC  Recent Labs Lab 10/17/16 0422 10/18/16 0350 10/19/16 0333  WBC 14.9* 18.7* 18.5*  HGB 8.9* 8.7* 8.5*  HCT 27.4* 27.3* 26.6*  PLT 577* 586* 578*    Coag's No results for input(s): APTT, INR in the last 168 hours.  Sepsis Markers  Recent Labs Lab 10/17/16 1526  LATICACIDVEN 1.3    ABG  Recent Labs Lab 10/14/16 0330 10/17/16 1304 10/17/16 1559  PHART 7.376 7.491* 7.443  PCO2ART 43.8 42.3 51.0*  PO2ART 67.1* 56.0* 169.0*    Glucose  Recent Labs Lab 10/18/16 1139 10/18/16 1622 10/18/16 1925 10/18/16 2342 10/19/16 0420 10/19/16 0733  GLUCAP 122* 127* 112* 101* 102* 117*    Imaging Ct Abdomen Pelvis Wo Contrast  Result Date: 10/18/2016 CLINICAL DATA:  Severe abdominal pain with vomiting.  Hemoptysis. EXAM: CT CHEST, ABDOMEN AND PELVIS WITHOUT CONTRAST TECHNIQUE: Multidetector CT imaging of the chest, abdomen and pelvis was performed following the standard protocol without IV contrast. COMPARISON:  Recent abdomen radiographs. FINDINGS: CT CHEST FINDINGS Cardiovascular: Atheromatous arterial calcifications, including the coronary arteries and thoracic aorta. Normal sized heart. Mediastinum/Nodes: Endotracheal tube tip 3 cm above the carina. Orogastric tube extending  into the stomach. No enlarged lymph nodes. Lungs/Pleura: Diffuse, dense left lower lobe atelectasis mild are right lower lobe and bilateral upper lobe atelectasis. Patchy interstitial prominence throughout both lungs. No pleural fluid. Musculoskeletal: Thoracic spine degenerative changes. CT ABDOMEN PELVIS FINDINGS Hepatobiliary: Small amount of high density material in the dependent portion of the gallbladder with appearance suggesting small gallstones. No gallbladder wall thickening or pericholecystic fluid. Unremarkable liver. Pancreas: Unremarkable. No pancreatic ductal dilatation or  surrounding inflammatory changes. Spleen: Normal in size without focal abnormality. Adrenals/Urinary Tract: Unremarkable adrenal glands. Small amount of bilateral renal artery calcification with no renal calculi a seen. No bladder or ureteral calculi and no hydronephrosis. A Foley catheter is in the urinary bladder with minimal urine in the bladder. Stomach/Bowel: Orogastric tube tip in the duodenal bulb. Unremarkable colon and small bowel. Normal appendix. Vascular/Lymphatic: Mild atheromatous arterial calcifications without aneurysm. No enlarged lymph nodes. Reproductive: Uterus and bilateral adnexa are unremarkable. Other: Small bilateral inguinal hernias containing fat. Mild bilateral subcutaneous edema. Additional focal areas of edema in the anterior subcutaneous fat bilaterally, compatible with recent injections. Tiny umbilical hernia containing fat. Musculoskeletal: Minimal lumbar spine degenerative changes. IMPRESSION: 1. Dense left lower lobe atelectasis or pneumonia. 2. Mild right lower lobe and bilateral upper lobe atelectasis. 3. Patchy interstitial prominence throughout both lungs, with an appearance most compatible with interstitial pulmonary edema. Interstitial pneumonitis is also a possibility. 4. Probable cholelithiasis without evidence of cholecystitis. 5. Coronary artery atherosclerosis and aortic atherosclerosis. Electronically Signed   By: Claudie Revering M.D.   On: 10/18/2016 14:37   Ct Chest Wo Contrast  Result Date: 10/18/2016 CLINICAL DATA:  Severe abdominal pain with vomiting.  Hemoptysis. EXAM: CT CHEST, ABDOMEN AND PELVIS WITHOUT CONTRAST TECHNIQUE: Multidetector CT imaging of the chest, abdomen and pelvis was performed following the standard protocol without IV contrast. COMPARISON:  Recent abdomen radiographs. FINDINGS: CT CHEST FINDINGS Cardiovascular: Atheromatous arterial calcifications, including the coronary arteries and thoracic aorta. Normal sized heart. Mediastinum/Nodes:  Endotracheal tube tip 3 cm above the carina. Orogastric tube extending into the stomach. No enlarged lymph nodes. Lungs/Pleura: Diffuse, dense left lower lobe atelectasis mild are right lower lobe and bilateral upper lobe atelectasis. Patchy interstitial prominence throughout both lungs. No pleural fluid. Musculoskeletal: Thoracic spine degenerative changes. CT ABDOMEN PELVIS FINDINGS Hepatobiliary: Small amount of high density material in the dependent portion of the gallbladder with appearance suggesting small gallstones. No gallbladder wall thickening or pericholecystic fluid. Unremarkable liver. Pancreas: Unremarkable. No pancreatic ductal dilatation or surrounding inflammatory changes. Spleen: Normal in size without focal abnormality. Adrenals/Urinary Tract: Unremarkable adrenal glands. Small amount of bilateral renal artery calcification with no renal calculi a seen. No bladder or ureteral calculi and no hydronephrosis. A Foley catheter is in the urinary bladder with minimal urine in the bladder. Stomach/Bowel: Orogastric tube tip in the duodenal bulb. Unremarkable colon and small bowel. Normal appendix. Vascular/Lymphatic: Mild atheromatous arterial calcifications without aneurysm. No enlarged lymph nodes. Reproductive: Uterus and bilateral adnexa are unremarkable. Other: Small bilateral inguinal hernias containing fat. Mild bilateral subcutaneous edema. Additional focal areas of edema in the anterior subcutaneous fat bilaterally, compatible with recent injections. Tiny umbilical hernia containing fat. Musculoskeletal: Minimal lumbar spine degenerative changes. IMPRESSION: 1. Dense left lower lobe atelectasis or pneumonia. 2. Mild right lower lobe and bilateral upper lobe atelectasis. 3. Patchy interstitial prominence throughout both lungs, with an appearance most compatible with interstitial pulmonary edema. Interstitial pneumonitis is also a possibility. 4. Probable cholelithiasis without evidence of  cholecystitis. 5. Coronary artery atherosclerosis  and aortic atherosclerosis. Electronically Signed   By: Claudie Revering M.D.   On: 10/18/2016 14:37   Dg Chest Port 1 View  Result Date: 10/19/2016 CLINICAL DATA:  Respiratory failure . EXAM: PORTABLE CHEST 1 VIEW COMPARISON:  10/17/2016 . FINDINGS: Endotracheal tube, NG tube, left IJ line stable position. Left base infiltrate noted with small left pleural effusion. No acute bony abnormality . IMPRESSION: 1. Lines and tubes in stable position. 2. Left base infiltrate with small left pleural effusion noted. Electronically Signed   By: Marcello Moores  Register   On: 10/19/2016 07:34    STUDIES:  CXR 4/16: Right perihilar and left basilar infiltration or consolidation. CXR 4/17 and 4/18 - patchy b/l infiltrates CXR 4/19 persistent b/l infiltrates Ab XR 4/19 NG tube tip in distal stomach Ab XR 4/22 NG tube tip in distal stomach   CULTURES: 4/14 resp culture > normal flora MRSA nasal swab neg. 4/14 urine culture >>negative. 4/14 blood culture  >> negative 4/14 influenza pcr panel > POSITIVE INFLUENZA B 4/14 urine strep > neg. 4/14 urine leg >    ANTIBIOTICS: 4/13 levaquin > 4/14 4/14 Azithromycin > 4/16 4/14 Vancomycin > 4/17 4/13 Tamiflu > 4/18 4/14 Ceftriaxone > 4/20   SIGNIFICANT EVENTS: 4/13 admission [place] 4/14 intubation, transfer to [facility] 4/15 - remains intubated 4/17 - on vent, improving, diuresed well  4/21 extubated but reintubated ( agitation, psych?)   LINES/TUBES: 4/14 ETT > 4/21 4/21 ETT > [facility] CVL PIV   DISCUSSION: 56 y/o female with a past medical history of presumed COPD based on her smoking history who is here now with acute respiratory failure with hypoxemia secondary to influenza B pneumonia.    ASSESSMENT / PLAN:  PULMONARY A: Acute respiratory failure with hypoxemia from influenza and super bacterial infection  With ARDS Presumed COPD with wheezing improving with bronchodilators and steroid   Tobacco abuse P:   Attempt weaning today   CARDIOVASCULAR A:  Demand ischemia Septic shock with Hypotension requiring pressor - off pressor 4/96 Grd 2 diastolic dysfunction Hypertension: BP mostly 102-140/55-79. P:  Continue labetatol 20 mg IV and hydralazine 20 mg as needed   RENAL A:   AKI, likely from ATN, improved hypovolemia. BUN/Crt continues to down trend. Hypokalemia: Suspect from vomiting and insensible losses. Hypernatremia: Resolved. P:   Follow BMET   GASTROINTESTINAL A:   Epigastric abdominal pain: Possibly GERD related. CT chest/ab/pelvis w/o any acute findings. LFTs reassuring. P:   Continue famotidine to 20 mg twice daily for GERD   HEMATOLOGIC A:   Normocytic anemia: Hb 9-10 though now trending 8 over last two days. No signs of overt blood loss other than bloody sputum described above. Leukocytosis: 18.5 stable from yesterday. P:  Sub q hep   INFECTIOUS A:   Influenza B (by report, can't see lab result from outside hospital) with possible bacterial superinfection: s/p antimicrobial therapy P:   Continue assessing   ENDOCRINE A:   Hyperglycemia Steroids in use P:   Monitor glucose Reduce steroids further   NEUROLOGIC A:   Sedation needs for vent synchrony Depression, anxiety P:   RASS goal: 0 Continue propofol gtt and scheduled clonazepam and home fluoxetine Daily wake up assessment  Charlott Rakes, PGY3 Internal Medicine Pager: [phone]  10/19/2016 7:50 AM

## 2016-10-20 ENCOUNTER — Inpatient Hospital Stay (HOSPITAL_COMMUNITY): Payer: BLUE CROSS/BLUE SHIELD

## 2016-10-20 LAB — BASIC METABOLIC PANEL
Anion gap: 9 (ref 5–15)
BUN: 28 mg/dL — AB (ref 6–20)
CALCIUM: 8.5 mg/dL — AB (ref 8.9–10.3)
CHLORIDE: 104 mmol/L (ref 101–111)
CO2: 26 mmol/L (ref 22–32)
CREATININE: 1.03 mg/dL — AB (ref 0.44–1.00)
GFR calc Af Amer: >60 mL/min (ref >60)
GFR calc non Af Amer: >60 mL/min (ref >60)
Glucose, Bld: 117 mg/dL — ABNORMAL HIGH (ref 65–99)
Potassium: 3.4 mmol/L — ABNORMAL LOW (ref 3.5–5.1)
SODIUM: 139 mmol/L (ref 135–145)

## 2016-10-20 LAB — GLUCOSE, CAPILLARY
GLUCOSE-CAPILLARY: 114 mg/dL — AB (ref 65–99)
GLUCOSE-CAPILLARY: 121 mg/dL — AB (ref 65–99)
GLUCOSE-CAPILLARY: 126 mg/dL — AB (ref 65–99)
Glucose-Capillary: 111 mg/dL — ABNORMAL HIGH (ref 65–99)
Glucose-Capillary: 109 mg/dL — ABNORMAL HIGH (ref 65–99)
Glucose-Capillary: 117 mg/dL — ABNORMAL HIGH (ref 65–99)

## 2016-10-20 LAB — CBC
HCT: 25.7 % — ABNORMAL LOW (ref 36.0–46.0)
Hemoglobin: 8.3 g/dL — ABNORMAL LOW (ref 12.0–15.0)
MCH: 27.9 pg (ref 26.0–34.0)
MCHC: 32.3 g/dL (ref 30.0–36.0)
MCV: 86.5 fL (ref 78.0–100.0)
PLATELETS: 494 10*3/uL — AB (ref 150–400)
RBC: 2.97 MIL/uL — ABNORMAL LOW (ref 3.87–5.11)
RDW: 14.3 % (ref 11.5–15.5)
WBC: 17.4 10*3/uL — AB (ref 4.0–10.5)

## 2016-10-20 LAB — MAGNESIUM: Magnesium: 1.9 mg/dL (ref 1.7–2.4)

## 2016-10-20 LAB — TRIGLYCERIDES: Triglycerides: 152 mg/dL — ABNORMAL HIGH (ref <150)

## 2016-10-20 LAB — PROCALCITONIN: PROCALCITONIN: 0.1 ng/mL

## 2016-10-20 MED ORDER — DEXMEDETOMIDINE HCL IN NACL 400 MCG/100ML IV SOLN
0.0000 ug/kg/h | INTRAVENOUS | Status: DC
  Administered 2016-10-21: 1.3 ug/kg/h via INTRAVENOUS
  Administered 2016-10-21 (×4): 1.7 ug/kg/h via INTRAVENOUS
  Administered 2016-10-21: 1.3 ug/kg/h via INTRAVENOUS
  Administered 2016-10-21 (×3): 1.7 ug/kg/h via INTRAVENOUS
  Filled 2016-10-20 (×9): qty 100

## 2016-10-20 MED ORDER — LISINOPRIL 20 MG PO TABS: 20.0000 mg | ORAL_TABLET | Freq: Every day | ORAL | Status: DC

## 2016-10-20 MED ORDER — HYDROCHLOROTHIAZIDE 25 MG PO TABS: 25.0000 mg | ORAL_TABLET | Freq: Every day | ORAL | Status: DC

## 2016-10-20 MED ORDER — SODIUM CHLORIDE 0.9 % IV SOLN
30.0000 meq | Freq: Two times a day (BID) | INTRAVENOUS | Status: AC
  Administered 2016-10-20 (×2): 30 meq via INTRAVENOUS
  Filled 2016-10-20 (×2): qty 15

## 2016-10-20 MED ORDER — SODIUM CHLORIDE 0.9 % IV SOLN
0.0000 ug/kg/h | INTRAVENOUS | Status: DC
  Administered 2016-10-20: 1.3 ug/kg/h via INTRAVENOUS
  Administered 2016-10-20: 0.5 ug/kg/h via INTRAVENOUS
  Administered 2016-10-20 (×2): 1.3 ug/kg/h via INTRAVENOUS
  Administered 2016-10-20: 0.2 ug/kg/h via INTRAVENOUS
  Administered 2016-10-20: 1.2 ug/kg/h via INTRAVENOUS
  Filled 2016-10-20 (×6): qty 2

## 2016-10-20 MED ORDER — LISINOPRIL-HYDROCHLOROTHIAZIDE 20-25 MG PO TABS: 1.0000 | ORAL_TABLET | Freq: Every day | ORAL | Status: DC

## 2016-10-20 NOTE — Progress Notes (Signed)
[facility] Progress Note Patient Name: Rachel [name] DOB: 03-Oct-1960 MRN: [id number]   Date of Service  10/20/2016  HPI/Events of Note  Agitation - Patient attempting to climb out of bed. Currently on Precedex IV infusion at 1.2 mcg/kg/hour.   eICU Interventions  Will increase ceiling on Precedex IV infusion to 1.7 mcg/kg/hour.     Intervention Category Major Interventions: Delirium, psychosis, severe agitation - evaluation and management  Sommer,Steven Eugene 10/20/2016, 6:30 PM

## 2016-10-20 NOTE — Telephone Encounter (Signed)
Rec'd original FMLA forms from patient daughter [name] via interoffice mail - fwd to [employer] via interoffice mail - 10/20/16 -pr

## 2016-10-20 NOTE — Progress Notes (Signed)
PULMONARY / CRITICAL CARE MEDICINE   Name: Rachel [name]  MRN: [id number] DOB: [birth year]    ADMISSION DATE:  10/10/2016 CONSULTATION DATE:  10/20/2016   REFERRING MD:  Alean Rinne from [place]:  Respiratory failure   BRIEF:  56 y/o female with emphysema admitted with Influenza B pneumonia/ARDS.  SUBJECTIVE: This morning, she appears comfortable and is following commands. Unable to extubate yesterday pending tachypnea and anxiety.  VITAL SIGNS: BP 119/65   Pulse 76   Temp 98.9 F (37.2 C) (Oral)   Resp (!) 29   Ht 5\' 2"  (1.575 m)   Wt 94.8 kg (208 lb 15.9 oz)   SpO2 98%   BMI 38.23 kg/m   HEMODYNAMICS: CVP:  [10 mmHg-31 mmHg] 10 mmHg  VENTILATOR SETTINGS: Vent Mode: PSV;CPAP FiO2 (%):  [40 %] 40 % Set Rate:  [28 bmp] 28 bmp Vt Set:  [370 mL] 370 mL PEEP:  [5 cmH20] 5 cmH20 Pressure Support:  [10 cmH20] 10 cmH20 Plateau Pressure:  [26 cmH20] 26 cmH20  INTAKE / OUTPUT: I/O last 3 completed shifts: In: 4536.3 [I.V.:3696.4; NG/GT:375; IV Piggyback:464.9] Out: 2920 [Urine:2920]  PHYSICAL EXAMINATION: Physical Exam  Constitutional: No distress.  HENT:  Head: Normocephalic and atraumatic.  Eyes: Conjunctivae are normal. No scleral icterus.  Cardiovascular: Normal rate and regular rhythm.   Pulmonary/Chest: Effort normal. No respiratory distress.  Abdominal: Soft. She exhibits no distension. There is no tenderness.  Neurological: She is alert.  Opens/closes eyes on command, wiggles toes on command  Skin: She is not diaphoretic.    BMET  Recent Labs Lab 10/18/16 0350 10/19/16 0333 10/20/16 0305  NA 145 139 139  K 3.2* 3.4* 3.4*  CL 103 102 104  CO2 31 29 26   BUN 69* 42* 28*  CREATININE 1.49* 1.17* 1.03*  GLUCOSE 135* 108* 117*    Electrolytes  Recent Labs Lab 10/15/16 0750 10/16/16 0322  10/17/16 0422  10/18/16 0350 10/19/16 0333 10/20/16 0305  CALCIUM 9.2 9.3  < > 9.2  < > 9.1 8.7* 8.5*  MG 3.1* 3.1*  --  2.6*   --   --   --  1.9  PHOS 5.5* 3.2  --  3.8  --   --   --   --   < > = values in this interval not displayed.  CBC  Recent Labs Lab 10/18/16 0350 10/19/16 0333 10/20/16 0305  WBC 18.7* 18.5* 17.4*  HGB 8.7* 8.5* 8.3*  HCT 27.3* 26.6* 25.7*  PLT 586* 578* 494*    Coag's No results for input(s): APTT, INR in the last 168 hours.  Sepsis Markers  Recent Labs Lab 10/17/16 1526 10/19/16 1120 10/20/16 0305  LATICACIDVEN 1.3  --   --   PROCALCITON  --  <0.10 0.10    ABG  Recent Labs Lab 10/14/16 0330 10/17/16 1304 10/17/16 1559  PHART 7.376 7.491* 7.443  PCO2ART 43.8 42.3 51.0*  PO2ART 67.1* 56.0* 169.0*    Glucose  Recent Labs Lab 10/19/16 1231 10/19/16 1528 10/19/16 2010 10/19/16 2335 10/20/16 0321 10/20/16 0733  GLUCAP 113* 116* 113* 111* 111* 109*    Imaging No results found.  STUDIES:  CXR 4/16: Right perihilar and left basilar infiltration or consolidation. CXR 4/17 and 4/18 - patchy b/l infiltrates CXR 4/19 persistent b/l infiltrates Ab XR 4/19 NG tube tip in distal stomach Ab XR 4/22 NG tube tip in distal stomach   CULTURES: 4/14 resp culture > normal flora MRSA nasal swab neg. 4/14 urine  culture >>negative. 4/14 blood culture  >> negative 4/14 influenza pcr panel > POSITIVE INFLUENZA B 4/14 urine strep > neg. 4/14 urine leg >    ANTIBIOTICS: 4/13 levaquin > 4/14 4/14 Azithromycin > 4/16 4/14 Vancomycin > 4/17 4/13 Tamiflu > 4/18 4/14 Ceftriaxone > 4/20   SIGNIFICANT EVENTS: 4/13 admission [place] 4/14 intubation, transfer to [facility] 4/15 - remains intubated 4/17 - on vent, improving, diuresed well  4/21 extubated but reintubated ( agitation, psych?)   LINES/TUBES: 4/14 ETT > 4/21 4/21 ETT > [facility] CVL PIV   DISCUSSION: 56 y/o female with a past medical history of presumed COPD based on her smoking history who is here now with acute respiratory failure with hypoxemia secondary to influenza B pneumonia.     ASSESSMENT / PLAN:  PULMONARY A: Acute respiratory failure with hypoxemia from influenza and super bacterial infection  with ARDS Presumed COPD with wheezing improving with bronchodilators and steroid  Tobacco abuse P:   Continue vent support but will try again to wean today Continue bronchodilators and steroids   CARDIOVASCULAR A:  Demand ischemia Septic shock with Hypotension requiring pressor - off pressor 2/29 Grd 2 diastolic dysfunction Hypertension P:  Continue labetatol 20 mg IV and hydralazine 20 mg as needed Restart lisinopril/HCTZ today   RENAL A:   AKI, likely from ATN, improved hypovolemia. BUN/Crt continues to down trend. Hypokalemia: Suspect from vomiting and insensible losses. P:   Follow BMET Replete K   GASTROINTESTINAL A:   Epigastric abdominal pain: Resolved P:   Continue famotidine to 20 mg twice daily for GERD   HEMATOLOGIC A:   Normocytic anemia: Hb 9-10 though now trending 8 over last two days. No signs of overt blood loss other than bloody sputum described above. Leukocytosis: 17.4, minimally improved from 18.5 yesterday. P:  Sub q hep   INFECTIOUS A:   Influenza B (by report, can't see lab result from outside hospital) with possible bacterial superinfection: s/p antimicrobial therapy P:   Continue assessing   ENDOCRINE A:   Hyperglycemia: steroid effect P:   Monitor glucose   NEUROLOGIC A:   Sedation needs for vent synchrony Depression, anxiety P:   RASS goal: 0 Continue propofol gtt and scheduled clonazepam TID and home fluoxetine Daily wake up assessment  Charlott Rakes, PGY3 Internal Medicine Pager: [phone]  10/20/2016 8:23 AM

## 2016-10-21 ENCOUNTER — Inpatient Hospital Stay (HOSPITAL_COMMUNITY): Payer: BLUE CROSS/BLUE SHIELD

## 2016-10-21 ENCOUNTER — Telehealth: Payer: Self-pay | Admitting: Pulmonary Disease

## 2016-10-21 LAB — GLUCOSE, CAPILLARY
GLUCOSE-CAPILLARY: 128 mg/dL — AB (ref 65–99)
GLUCOSE-CAPILLARY: 122 mg/dL — AB (ref 65–99)
GLUCOSE-CAPILLARY: 131 mg/dL — AB (ref 65–99)
GLUCOSE-CAPILLARY: 135 mg/dL — AB (ref 65–99)
GLUCOSE-CAPILLARY: 97 mg/dL (ref 65–99)
Glucose-Capillary: 114 mg/dL — ABNORMAL HIGH (ref 65–99)

## 2016-10-21 LAB — BASIC METABOLIC PANEL
ANION GAP: 7 (ref 5–15)
BUN: 23 mg/dL — ABNORMAL HIGH (ref 6–20)
CHLORIDE: 106 mmol/L (ref 101–111)
CO2: 24 mmol/L (ref 22–32)
Calcium: 8.6 mg/dL — ABNORMAL LOW (ref 8.9–10.3)
Creatinine, Ser: 0.89 mg/dL (ref 0.44–1.00)
GFR calc Af Amer: >60 mL/min (ref >60)
GFR calc non Af Amer: >60 mL/min (ref >60)
GLUCOSE: 151 mg/dL — AB (ref 65–99)
Potassium: 3.9 mmol/L (ref 3.5–5.1)
Sodium: 137 mmol/L (ref 135–145)

## 2016-10-21 LAB — PROCALCITONIN: Procalcitonin: 0.1 ng/mL

## 2016-10-21 LAB — CULTURE, RESPIRATORY W GRAM STAIN: Special Requests: NORMAL

## 2016-10-21 LAB — CBC
HEMATOCRIT: 26.1 % — AB (ref 36.0–46.0)
HEMOGLOBIN: 8.5 g/dL — AB (ref 12.0–15.0)
MCH: 28.1 pg (ref 26.0–34.0)
MCHC: 32.6 g/dL (ref 30.0–36.0)
MCV: 86.4 fL (ref 78.0–100.0)
Platelets: 466 10*3/uL — ABNORMAL HIGH (ref 150–400)
RBC: 3.02 MIL/uL — AB (ref 3.87–5.11)
RDW: 14.4 % (ref 11.5–15.5)
WBC: 16.7 10*3/uL — ABNORMAL HIGH (ref 4.0–10.5)

## 2016-10-21 LAB — TRIGLYCERIDES: Triglycerides: 179 mg/dL — ABNORMAL HIGH (ref <150)

## 2016-10-21 LAB — CULTURE, RESPIRATORY: CULTURE: NORMAL

## 2016-10-21 LAB — MAGNESIUM: MAGNESIUM: 1.8 mg/dL (ref 1.7–2.4)

## 2016-10-21 MED ORDER — PROPOFOL 1000 MG/100ML IV EMUL
0.0000 ug/kg/min | INTRAVENOUS | Status: DC
  Administered 2016-10-21: 20 ug/kg/min via INTRAVENOUS
  Administered 2016-10-22: 25 ug/kg/min via INTRAVENOUS
  Administered 2016-10-22: 10 ug/kg/min via INTRAVENOUS
  Filled 2016-10-21: qty 100
  Filled 2016-10-21: qty 200

## 2016-10-21 MED ORDER — PREDNISONE 5 MG PO TABS: 5.0000 mg | ORAL_TABLET | Freq: Every day | ORAL | Status: DC

## 2016-10-21 MED ORDER — FUROSEMIDE 10 MG/ML IJ SOLN
40.0000 mg | Freq: Once | INTRAMUSCULAR | Status: AC
  Administered 2016-10-21: 40 mg via INTRAVENOUS
  Filled 2016-10-21: qty 4

## 2016-10-21 MED ORDER — ORAL CARE MOUTH RINSE
15.0000 mL | Freq: Four times a day (QID) | OROMUCOSAL | Status: DC
  Administered 2016-10-22 (×3): 15 mL via OROMUCOSAL

## 2016-10-21 MED ORDER — POTASSIUM CHLORIDE 20 MEQ/15ML (10%) PO SOLN
20.0000 meq | Freq: Once | ORAL | Status: AC
  Administered 2016-10-21: 20 meq via ORAL
  Filled 2016-10-21: qty 15

## 2016-10-21 MED ORDER — PREDNISONE 5 MG PO TABS: 15.0000 mg | ORAL_TABLET | Freq: Every day | ORAL | Status: DC

## 2016-10-21 MED ORDER — PREDNISONE 5 MG PO TABS
25.0000 mg | ORAL_TABLET | Freq: Every day | ORAL | Status: DC
  Administered 2016-10-22: 25 mg
  Filled 2016-10-21 (×4): qty 1

## 2016-10-21 NOTE — Progress Notes (Signed)
I spoke with her daughter [name] today who revealed to me that her mother was diagnosed with bipolar disorder in the past. She describes her manic episodes as intervals of excessive shopping though she has also traveled as far as [place], lived in a homeless shelter, and then get kicked out. She used to have a psychiatrist in [place] though fired him and has been on fluoxetine and clonazepam since but in unsure who is prescribing this medication.  Her QTc is 456, and prior to initiating any atypical agent, we have consulted psychiatry for additional recommendations.

## 2016-10-21 NOTE — Progress Notes (Signed)
Nutrition Follow-up  DOCUMENTATION CODES:   Obesity unspecified  INTERVENTION:   Consider adding Reglan. May need post-pyloric feeding tube.  When able, resume:   Vital High Protein at 50 ml/h (1200 ml per day)  Provides 1200 kcal, 105 gm protein, 1003 ml free water daily  NUTRITION DIAGNOSIS:   Inadequate oral intake related to inability to eat as evidenced by NPO status.  Ongoing  GOAL:   Provide needs based on ASPEN/SCCM guidelines  Unmet  MONITOR:   Vent status, TF tolerance, Labs, I & O's  ASSESSMENT:   56 y/o female with emphysema admitted with Influenza B pneumonia/ARDS.  Discussed patient in ICU rounds and with RN today.  Vent weaning continues. For SBT today. TF is off due to vomiting this morning. Patient remains intubated on ventilator support MV: 12.2 L/min Temp (24hrs), Avg:97.6 F (36.4 C), Min:97.2 F (36.2 C), Max:98.5 F (36.9 C)   Labs and medications reviewed.  Diet Order:  Diet NPO time specified  Skin:  Reviewed, no issues  Last BM:  4/24  Height:   Ht Readings from Last 1 Encounters:  10/10/16 5\' 2"  (1.575 m)    Weight:   Wt Readings from Last 1 Encounters:  10/21/16 209 lb 10.5 oz (95.1 kg)    Ideal Body Weight:  50 kg  BMI:  Body mass index is 38.35 kg/m.  Estimated Nutritional Needs:   Kcal:  4196-2229  Protein:  100 gm  Fluid:  1.5 L  EDUCATION NEEDS:   No education needs identified at this time  Molli Barrows, [place], [place], [place] Pager [phone] After Hours Pager [phone]

## 2016-10-21 NOTE — Progress Notes (Signed)
Pt vomited tube feeding, tube feeding placed on hold. Notified resident MD Molt. Will continue to hold tube feeds. Will continue to monitor.

## 2016-10-21 NOTE — Progress Notes (Signed)
Called to bedside by staff RN for patient with severe agitation. Despite being on 1.7 Precedex and PRN fentanyl/Versed, she remains combative with staff and is attempting to extubate. She has also been having some bradycardic episodes with HRs into the low 40s. At this time will unfortunately have to transition back to propofol for sedation.   Daughter in room at this time and stating her mother didn't really want all this aggressive care in the first place, and certainly doesn't want it any longer. They are prepared to have some discussion of goals of care regarding extubation in the AM with the medical team.   Rachel [name], AGACNP-BC [facility] Pulmonology/Critical Care Pager [phone] or [phone]  10/21/2016 8:41 PM

## 2016-10-21 NOTE — Progress Notes (Signed)
PULMONARY / CRITICAL CARE MEDICINE   Name: Rachel [name]  MRN: [id number] DOB: 14-Oct-1960    ADMISSION DATE:  10/10/2016 CONSULTATION DATE:  10/21/2016   REFERRING MD:  Alean Rinne from [facility]:  Respiratory failure   BRIEF:  56 y/o female with emphysema admitted with Influenza B pneumonia/ARDS.  SUBJECTIVE: Overnight, she threw up tube feeds and was more agitated with Precedex.  VITAL SIGNS: BP (!) 146/65   Pulse 69   Temp 97.2 F (36.2 C) (Oral)   Resp (!) 27   Ht 5\' 2"  (1.575 m)   Wt 95.1 kg (209 lb 10.5 oz)   SpO2 96%   BMI 38.35 kg/m   HEMODYNAMICS:    VENTILATOR SETTINGS: Vent Mode: PSV;CPAP FiO2 (%):  [40 %] 40 % Set Rate:  [28 bmp] 28 bmp Vt Set:  [370 mL] 370 mL PEEP:  [5 cmH20] 5 cmH20 Pressure Support:  [10 cmH20] 10 cmH20 Plateau Pressure:  [20 cmH20-26 cmH20] 20 cmH20  INTAKE / OUTPUT: I/O last 3 completed shifts: In: 4895.7 [I.V.:3279.3; NG/GT:936.7; IV Piggyback:679.8] Out: 3300 [Urine:3300]  PHYSICAL EXAMINATION: Physical Exam  Constitutional: No distress.  HENT:  Head: Normocephalic and atraumatic.  Eyes: Conjunctivae are normal. No scleral icterus.  Cardiovascular: Normal rate and regular rhythm.   Pulmonary/Chest: Effort normal. No respiratory distress.  Abdominal: Soft. She exhibits no distension. There is no tenderness.  Neurological: She is alert.  Opens/closes eyes on command, wiggles toes on command  Skin: She is not diaphoretic.    BMET  Recent Labs Lab 10/19/16 0333 10/20/16 0305 10/21/16 0318  NA 139 139 137  K 3.4* 3.4* 3.9  CL 102 104 106  CO2 29 26 24   BUN 42* 28* 23*  CREATININE 1.17* 1.03* 0.89  GLUCOSE 108* 117* 151*    Electrolytes  Recent Labs Lab 10/15/16 0750 10/16/16 0322  10/17/16 0422  10/19/16 0333 10/20/16 0305 10/21/16 0318  CALCIUM 9.2 9.3  < > 9.2  < > 8.7* 8.5* 8.6*  MG 3.1* 3.1*  --  2.6*  --   --  1.9  --   PHOS 5.5* 3.2  --  3.8  --   --   --   --   < > =  values in this interval not displayed.  CBC  Recent Labs Lab 10/19/16 0333 10/20/16 0305 10/21/16 0318  WBC 18.5* 17.4* 16.7*  HGB 8.5* 8.3* 8.5*  HCT 26.6* 25.7* 26.1*  PLT 578* 494* 466*    Coag's No results for input(s): APTT, INR in the last 168 hours.  Sepsis Markers  Recent Labs Lab 10/17/16 1526 10/19/16 1120 10/20/16 0305 10/21/16 0318  LATICACIDVEN 1.3  --   --   --   PROCALCITON  --  <0.10 0.10 0.10    ABG  Recent Labs Lab 10/17/16 1304 10/17/16 1559  PHART 7.491* 7.443  PCO2ART 42.3 51.0*  PO2ART 56.0* 169.0*    Glucose  Recent Labs Lab 10/20/16 0733 10/20/16 1150 10/20/16 1524 10/20/16 2005 10/20/16 2326 10/21/16 0420  GLUCAP 109* 114* 121* 126* 117* 128*    Imaging Dg Chest Port 1 View  Result Date: 10/21/2016 CLINICAL DATA:  Respiratory failure. EXAM: PORTABLE CHEST 1 VIEW COMPARISON:  10/20/2016. FINDINGS: Endotracheal tube, NG tube, left IJ line stable position. Heart size stable. Basilar atelectasis . Interim increase in bilateral pulmonary infiltrates/edema. Small left pleural effusion. No pneumothorax. IMPRESSION: 1. Lines and tubes in stable position. 2. Interim increase in bilateral pulmonary infiltrates/edema. Small left pleural effusion.  3. Bibasilar atelectasis. Electronically Signed   By: Marcello Moores  Register   On: 10/21/2016 06:47   Dg Chest Port 1 View  Result Date: 10/20/2016 CLINICAL DATA:  Ventilator support.  Assess endotracheal position. EXAM: PORTABLE CHEST 1 VIEW COMPARISON:  10/19/2016 FINDINGS: Endotracheal tube tip is 1.5 cm above the carina. Nasogastric tube enters the stomach. Left internal jugular central line tip is in the SVC above the right atrium. Mild persistent edema. Persistent left lower lobe collapse/ infiltrate. Small amount of pleural fluid on the left. IMPRESSION: Endotracheal tube well positioned. Persistent mild edema pattern. Persistent left lower lobe collapse/infiltrate. Electronically Signed   By: Nelson Chimes M.D.   On: 10/20/2016 09:53    STUDIES:  CXR 4/16: Right perihilar and left basilar infiltration or consolidation. CXR 4/17 and 4/18 - patchy b/l infiltrates CXR 4/19 persistent b/l infiltrates Ab XR 4/19 NG tube tip in distal stomach Ab XR 4/22 NG tube tip in distal stomach   CULTURES: 4/14 resp culture > normal flora MRSA nasal swab neg. 4/14 urine culture >>negative. 4/14 blood culture  >> negative 4/14 influenza pcr panel > POSITIVE INFLUENZA B 4/14 urine strep > neg. 4/14 urine leg >    ANTIBIOTICS: 4/13 levaquin > 4/14 4/14 Azithromycin > 4/16 4/14 Vancomycin > 4/17 4/13 Tamiflu > 4/18 4/14 Ceftriaxone > 4/20   SIGNIFICANT EVENTS: 4/13 admission [facility] 4/14 intubation, transfer to [facility] 4/15 - remains intubated 4/17 - on vent, improving, diuresed well  4/21 extubated but reintubated ( agitation, psych?)   LINES/TUBES: 4/14 ETT > 4/21 4/21 ETT > [facility] CVL PIV   DISCUSSION: 56 y/o female with a past medical history of presumed COPD based on her smoking history who is here now with acute respiratory failure with hypoxemia secondary to influenza B pneumonia.    ASSESSMENT / PLAN:  PULMONARY A: Acute respiratory failure with hypoxemia from influenza and super bacterial infection  with ARDS Presumed COPD with wheezing improving with bronchodilators and steroid  Tobacco abuse P:   Continue vent support but will try again to wean today Continue bronchodilators and taper steroids today    CARDIOVASCULAR A:  Demand ischemia Septic shock with Hypotension requiring pressor - off pressor 2/62 Grd 2 diastolic dysfunction Hypertension P:  Continue labetatol 20 mg IV and hydralazine 20 mg as needed   RENAL A:   AKI, likely from ATN, improved hypovolemia. BUN/Crt continues to down trend. Hypokalemia: Suspect from vomiting and insensible losses. P:   Follow BMET Replete K   GASTROINTESTINAL A:   Epigastric abdominal pain: Resolved P:    Continue famotidine to 20 mg twice daily for GERD   HEMATOLOGIC A:   Normocytic anemia: Hb 9-10 though now trending 8 over last two days. No signs of overt blood loss other than bloody sputum described above. Leukocytosis: 16.7, minimally improved from 17 yesterday. P:  Sub q hep   INFECTIOUS A:   Influenza B (by report, can't see lab result from outside hospital) with possible bacterial superinfection: s/p antimicrobial therapy. PCT reassuring. P:   Continue assessing   ENDOCRINE A:   Hyperglycemia: steroid effect P:   Monitor glucose   NEUROLOGIC A:   Sedation needs for vent synchrony Depression, anxiety P:   RASS goal: 0 Restart propofol gtt and scheduled clonazepam TID and home fluoxetine; d/c Precedex Clarify h/o bipolar d/o with daughter as SSRI monotherapy may precipitate manic episodes Daily wake up assessment  Charlott Rakes, PGY3 Internal Medicine Pager: [phone]  10/21/2016 8:12 AM

## 2016-10-21 NOTE — Progress Notes (Signed)
PT Cancellation Note  Patient Details Name: Rachel [name] MRN: [id number] DOB: 1961-01-03   Cancelled Treatment:    Reason Eval/Treat Not Completed: Medical issues which prohibited therapy. Pt very agitated, not following commands, well. RN in the process of giving her some sedation. Will hold for now.  Leighton Roach, PT  Acute Rehab Services  [place] 10/21/2016, 11:18 AM

## 2016-10-22 ENCOUNTER — Inpatient Hospital Stay (HOSPITAL_COMMUNITY): Payer: BLUE CROSS/BLUE SHIELD

## 2016-10-22 DIAGNOSIS — F17210 Nicotine dependence, cigarettes, uncomplicated: Secondary | ICD-10-CM

## 2016-10-22 DIAGNOSIS — J439 Emphysema, unspecified: Secondary | ICD-10-CM

## 2016-10-22 DIAGNOSIS — J1000 Influenza due to other identified influenza virus with unspecified type of pneumonia: Secondary | ICD-10-CM

## 2016-10-22 LAB — MAGNESIUM: MAGNESIUM: 1.8 mg/dL (ref 1.7–2.4)

## 2016-10-22 LAB — CBC
HCT: 26.1 % — ABNORMAL LOW (ref 36.0–46.0)
HEMOGLOBIN: 8.4 g/dL — AB (ref 12.0–15.0)
MCH: 27.7 pg (ref 26.0–34.0)
MCHC: 32.2 g/dL (ref 30.0–36.0)
MCV: 86.1 fL (ref 78.0–100.0)
PLATELETS: 534 10*3/uL — AB (ref 150–400)
RBC: 3.03 MIL/uL — AB (ref 3.87–5.11)
RDW: 14.7 % (ref 11.5–15.5)
WBC: 16.6 10*3/uL — ABNORMAL HIGH (ref 4.0–10.5)

## 2016-10-22 LAB — GLUCOSE, CAPILLARY
GLUCOSE-CAPILLARY: 111 mg/dL — AB (ref 65–99)
Glucose-Capillary: 103 mg/dL — ABNORMAL HIGH (ref 65–99)
Glucose-Capillary: 106 mg/dL — ABNORMAL HIGH (ref 65–99)
Glucose-Capillary: 93 mg/dL (ref 65–99)
Glucose-Capillary: 97 mg/dL (ref 65–99)
Glucose-Capillary: 99 mg/dL (ref 65–99)

## 2016-10-22 LAB — PHOSPHORUS: PHOSPHORUS: 3.5 mg/dL (ref 2.5–4.6)

## 2016-10-22 LAB — BASIC METABOLIC PANEL
Anion gap: 13 (ref 5–15)
BUN: 24 mg/dL — ABNORMAL HIGH (ref 6–20)
CALCIUM: 9.4 mg/dL (ref 8.9–10.3)
CHLORIDE: 107 mmol/L (ref 101–111)
CO2: 23 mmol/L (ref 22–32)
Creatinine, Ser: 0.99 mg/dL (ref 0.44–1.00)
GFR calc Af Amer: >60 mL/min (ref >60)
Glucose, Bld: 88 mg/dL (ref 65–99)
Potassium: 3.6 mmol/L (ref 3.5–5.1)
SODIUM: 143 mmol/L (ref 135–145)

## 2016-10-22 MED ORDER — CLONAZEPAM 1 MG PO TABS
1.0000 mg | ORAL_TABLET | Freq: Three times a day (TID) | ORAL | Status: DC | PRN
  Administered 2016-10-24 – 2016-10-25 (×3): 1 mg via ORAL
  Filled 2016-10-22 (×5): qty 1

## 2016-10-22 MED ORDER — ACETAMINOPHEN 325 MG PO TABS
650.0000 mg | ORAL_TABLET | Freq: Four times a day (QID) | ORAL | Status: DC | PRN
  Administered 2016-10-22: 650 mg via ORAL
  Filled 2016-10-22: qty 2

## 2016-10-22 MED ORDER — FUROSEMIDE 10 MG/ML IJ SOLN
40.0000 mg | Freq: Once | INTRAMUSCULAR | Status: AC
  Administered 2016-10-22: 40 mg via INTRAVENOUS
  Filled 2016-10-22: qty 4

## 2016-10-22 MED ORDER — METHYLPREDNISOLONE SODIUM SUCC 40 MG IJ SOLR
20.0000 mg | Freq: Once | INTRAMUSCULAR | Status: AC
  Administered 2016-10-22: 20 mg via INTRAVENOUS
  Filled 2016-10-22 (×2): qty 0.5

## 2016-10-22 MED ORDER — MAGNESIUM SULFATE 2 GM/50ML IV SOLN
2.0000 g | Freq: Once | INTRAVENOUS | Status: AC
Start: 2016-10-22 — End: 2016-10-22
  Administered 2016-10-22: 2 g via INTRAVENOUS
  Filled 2016-10-22: qty 50

## 2016-10-22 NOTE — Procedures (Signed)
Extubation Procedure Note  Patient Details:   Name: DECKLYN HORNIK DOB: 1961/02/18 MRN: [id number]   Airway Documentation:  Airway 7.5 mm (Active)  Secured at (cm) 23 cm 10/22/2016 12:21 PM  Measured From Lips 10/22/2016 12:21 PM  Secured Location Left 10/22/2016 12:21 PM  Secured By [employer] 10/22/2016 12:21 PM  Tube Holder Repositioned Yes 10/22/2016 12:21 PM  Cuff Pressure (cm H2O) 24 cm H2O 10/22/2016  3:03 AM  Site Condition Dry 10/22/2016 12:21 PM    Evaluation  O2 sats: stable throughout Complications: No apparent complications Patient did tolerate procedure well. Bilateral Breath Sounds: Coarse crackles, Expiratory wheezes   Yes   Pt placed on 50% vm after extubation with mild desaturation into 88.   Pts sats currently 95% on  50% vm.  RT will continue to monitor.  Pierre Bali 10/22/2016, 4:06 PM

## 2016-10-22 NOTE — Progress Notes (Signed)
PULMONARY / CRITICAL CARE MEDICINE   Name: Rachel [name]  MRN: [id number] DOB: 07/25/1960    ADMISSION DATE:  10/10/2016 CONSULTATION DATE:  10/22/2016   REFERRING MD:  Alean Rinne from [facility]:  Respiratory failure   BRIEF:  56 y/o female with emphysema admitted with Influenza B pneumonia/ARDS.  SUBJECTIVE: Overnight, she was started back on propofol for increasing agitation. This morning, she was able to write on her clipboard that she knew my twin sibling were in on it and that she was on it. She then wrote something else which I could not understand and refused to hand back the pen and clipboard, but we were able to put her hands back in the safety mits.   VITAL SIGNS: BP 138/65   Pulse 78   Temp 98.2 F (36.8 C) (Oral)   Resp (!) 25   Ht 5\' 2"  (1.575 m)   Wt 91.4 kg (201 lb 8 oz)   SpO2 97%   BMI 36.85 kg/m   HEMODYNAMICS:    VENTILATOR SETTINGS: Vent Mode: PRVC FiO2 (%):  [40 %] 40 % Set Rate:  [28 bmp] 28 bmp Vt Set:  [370 mL] 370 mL PEEP:  [5 cmH20] 5 cmH20 Pressure Support:  [10 cmH20] 10 cmH20 Plateau Pressure:  [21 cmH20-31 cmH20] 23 cmH20  INTAKE / OUTPUT: I/O last 3 completed shifts: In: 3315.3 [I.V.:2678.6; NG/GT:486.7; IV Piggyback:150] Out: 4630 [Urine:4630]  PHYSICAL EXAMINATION: Physical Exam  Constitutional: No distress.  HENT:  Head: Normocephalic and atraumatic.  Eyes: Conjunctivae are normal. No scleral icterus.  Cardiovascular: Normal rate and regular rhythm.   Pulmonary/Chest: Effort normal. No respiratory distress.  Abdominal: Soft. She exhibits no distension. There is no tenderness.  Neurological: She is alert.  Able to write on clipboard though non-sensical.  Skin: She is not diaphoretic.    BMET  Recent Labs Lab 10/20/16 0305 10/21/16 0318 10/22/16 0420  NA 139 137 143  K 3.4* 3.9 3.6  CL 104 106 107  CO2 26 24 23   BUN 28* 23* 24*  CREATININE 1.03* 0.89 0.99  GLUCOSE 117* 151* 88     Electrolytes  Recent Labs Lab 10/16/16 0322  10/17/16 0422  10/20/16 0305 10/21/16 0318 10/22/16 0420  CALCIUM 9.3  < > 9.2  < > 8.5* 8.6* 9.4  MG 3.1*  --  2.6*  --  1.9 1.8 1.8  PHOS 3.2  --  3.8  --   --   --  3.5  < > = values in this interval not displayed.  CBC  Recent Labs Lab 10/20/16 0305 10/21/16 0318 10/22/16 0420  WBC 17.4* 16.7* 16.6*  HGB 8.3* 8.5* 8.4*  HCT 25.7* 26.1* 26.1*  PLT 494* 466* 534*    Coag's No results for input(s): APTT, INR in the last 168 hours.  Sepsis Markers  Recent Labs Lab 10/17/16 1526 10/19/16 1120 10/20/16 0305 10/21/16 0318  LATICACIDVEN 1.3  --   --   --   PROCALCITON  --  <0.10 0.10 0.10    ABG  Recent Labs Lab 10/17/16 1304 10/17/16 1559  PHART 7.491* 7.443  PCO2ART 42.3 51.0*  PO2ART 56.0* 169.0*    Glucose  Recent Labs Lab 10/21/16 0810 10/21/16 1142 10/21/16 1542 10/21/16 1958 10/21/16 2325 10/22/16 0358  GLUCAP 122* 131* 135* 114* 97 97    Imaging No results found.  STUDIES:  CXR 4/16: Right perihilar and left basilar infiltration or consolidation. CXR 4/17 and 4/18 - patchy b/l infiltrates CXR  4/19 persistent b/l infiltrates Ab XR 4/19 NG tube tip in distal stomach Ab XR 4/22 NG tube tip in distal stomach   CULTURES: 4/14 resp culture > normal flora MRSA nasal swab neg. 4/14 urine culture >>negative. 4/14 blood culture  >> negative 4/14 influenza pcr panel > POSITIVE INFLUENZA B 4/14 urine strep > neg. 4/14 urine leg >    ANTIBIOTICS: 4/13 levaquin > 4/14 4/14 Azithromycin > 4/16 4/14 Vancomycin > 4/17 4/13 Tamiflu > 4/18 4/14 Ceftriaxone > 4/20   SIGNIFICANT EVENTS: 4/13 admission [place] 4/14 intubation, transfer to [facility] 4/15 - remains intubated 4/17 - on vent, improving, diuresed well  4/21 extubated but reintubated ( agitation, psych?) 4/24 propofol -> Precedex 4/25 More confused  4/26 Precedex -> propofol for worsening  agitation   LINES/TUBES: 4/14 ETT > 4/21 4/21 ETT > [facility] CVL PIV   DISCUSSION: 56 y/o female with a past medical history of presumed COPD based on her smoking history who is here now with acute respiratory failure with hypoxemia secondary to influenza B pneumonia.    ASSESSMENT / PLAN:  PULMONARY A: Acute respiratory failure with hypoxemia from influenza and super bacterial infection  with ARDS Presumed COPD with wheezing improving with bronchodilators and steroid  Tobacco abuse P:   Continue vent support but will try again to wean today Continue bronchodilators and steroid taper   CARDIOVASCULAR A:  Demand ischemia Septic shock with Hypotension requiring pressor - off pressor 4/38 Grd 2 diastolic dysfunction Hypertension P:  Continue labetatol 20 mg IV and hydralazine 20 mg as needed   RENAL A:   AKI, likely from ATN, improved hypovolemia. BUN/Crt continues to down trend. Hypokalemia: Suspect from vomiting and insensible losses. P:   Follow BMET Replete K/Mg   GASTROINTESTINAL A:   GERD P:   Continue famotidine to 20 mg twice daily for GERD   HEMATOLOGIC A:   Normocytic anemia: Hb 9-10 though now trending 8 over last two days. No signs of overt blood loss other than bloody sputum described above. Leukocytosis: 16.6, stable from yesterday. P:  Sub q hep   INFECTIOUS A:   Influenza B (by report, can't see lab result from outside hospital) with possible bacterial superinfection: s/p antimicrobial therapy. PCT reassuring. P:   Continue assessing   ENDOCRINE A:   Hyperglycemia: steroid effect P:   Monitor glucose   NEUROLOGIC A:   Sedation needs for vent synchrony Depression, anxiety: History of bipolar disorder per family yesterday. P:   RASS goal: 0 Continue propofol gtt and scheduled clonazepam TID and home fluoxetine Follow-up psych recommendations Daily wake up assessment   Charlott Rakes, PGY3 Internal Medicine Pager:  [phone]  10/22/2016 7:25 AM

## 2016-10-22 NOTE — Telephone Encounter (Signed)
Form received and placed in BQ's cubby. Per Charmian Muff and pt/pt's daughter aware that BQ is unavailable to fill out paperwork this week and next week.  Will hold folder until BQ's return to clinic.

## 2016-10-22 NOTE — Progress Notes (Signed)
I spoke with her daughter [name] at bedside, and she expressed her concerns about feeling out of the loop with the conversation and concern for a trach without her consent. I reassured her that we were not at that point and reviewed her mother's clinical progress so far. Given her great functional status prior to hospitalization, I shared her mother's good prognosis and that we needed to better treat her agitation. I also assured her that we would consent her should a trach be in order. She wanted to spend more time with her mother but thanked me for my update.

## 2016-10-22 NOTE — Consult Note (Signed)
[facility] Consult   Reason for Consult:  Depression and agitation Referring Physician:  Dr. Posey Pronto Patient Identification: Rachel [name] MRN:  [id number] Principal Diagnosis: Bipolar disorder with moderate depression ([place]) Diagnosis:   Patient Active Problem List   Diagnosis Date Noted  . Respiratory distress [R06.03]   . Acute on chronic respiratory failure with hypoxemia (HCC) [J96.21]   . Encounter for nasogastric (NG) tube placement [Z46.59]   . ARDS (adult respiratory distress syndrome) (HCC) [J80]   . Encounter for central line placement [Z45.2]   . Influenza B [J10.1] 10/10/2016  . Special screening for malignant neoplasms, colon [Z12.11]   . HYPERCHOLESTEROLEMIA [E78.00] 08/03/2009  . BIPOLAR DISORDER UNSPECIFIED [F31.9] 08/03/2009  . OBSESSIVE-COMPULSIVE DISORDER [F42.9] 08/03/2009  . DEPRESSION [F32.9] 08/03/2009  . ESSENTIAL HYPERTENSION, BENIGN [I10] 08/03/2009  . KNEE PAIN, BILATERAL [M25.569] 08/03/2009    Total Time spent with patient: 45 minutes  Subjective:   Rachel [name] is a 56 y.o. female patient admitted with acute respiratory failure with hypoxia.  HPI:  Rachel [name] is a 56 years old female who lives with her grownup son admitted to [facility] intensive care unit from [facility] for the management of acute respiratory failure with hypoxia secondary to influenza B pneumonia. Patient is calm and cooperative to unpleasant was able to communicate by writing and pad during this evaluation and patient daughter and son would also at bedside was able to contribute for this evaluation. Patient reportedly smokes 1 pack tobacco daily and has been diagnosed with emphysema. Patient required intubation and has been difficult to wean off due to uncontrollable agitation. Psychiatric consultation was requested for management of psychiatric medication. Patient reportedly diagnosed with bipolar depression and was seen by [facility] in the past. Patient is currently receiving outpatient medication management from Dr. Gerarda Fraction, [facility]. Her current medication were fluoxetine 20 mg daily for depression and clonazepam 1 mg 3 times daily as needed for anxiety. Patient reported she does not required mood stabilizes for more than 5 years. Previously she was given Seroquel but did not tolerate that well.  Past Psychiatric History: Bipolar disorder mostly presented with depression and received outpatient medication management but no acute psychiatric hospitalization.   Risk to Self: Is patient at risk for suicide?: No Risk to Others:   Prior Inpatient Therapy:   Prior Outpatient Therapy:    Past Medical History:  Past Medical History:  Diagnosis Date  . Anxiety   . Depression   . Hypertension     Past Surgical History:  Procedure Laterality Date  . cesarian  1990  . COLONOSCOPY N/A 08/10/2016   Procedure: COLONOSCOPY;  Surgeon: Danie Binder, MD;  Location: [facility];  Service: Endoscopy;  Laterality: N/A;  10:30 AM   Family History: History reviewed. No pertinent family history. Family Psychiatric  History: Non-contributory  Social History:  History  Alcohol Use  . Yes    Comment: occ     History  Drug Use    Comment: marijuana on occ    Social History   Social History  . Marital status: Single    Spouse name: N/A  . Number of children: N/A  . Years of education: N/A   Social History Main Topics  . Smoking status: Current Every Day Smoker    Packs/day: 1.00    Years: 25.00    Types: Cigarettes  . Smokeless tobacco: Never Used  . Alcohol use Yes     Comment:  occ  . Drug use: Yes     Comment: marijuana on occ  . Sexual activity: Not Asked   Other Topics Concern  . None   Social History Narrative   Right handed. Single.  3 kids.  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     Allergies:   Allergies  Allergen Reactions  . Hydrocodone Nausea And Vomiting  . Oxycodone Nausea And Vomiting    Codeine, hydrocodone, most pain pills.  . Penicillins Itching and Nausea And Vomiting    Has patient had a PCN reaction causing immediate rash, facial/tongue/throat swelling, SOB or lightheadedness with hypotension: Yes, around the eyes Has patient had a PCN reaction causing severe rash involving mucus membranes or skin necrosis: No Has patient had a PCN reaction that required hospitalization: No Has patient had a PCN reaction occurring within the last 10 years: No  If all of the above answers are "NO", then may proceed with Cephalosporin use.   . Seroquel [Quetiapine]     More agitated/confused per daughter Rachel [name] when given in the past.    Labs:  Results for orders placed or performed during the hospital encounter of 10/10/16 (from the past 48 hour(s))  Glucose, capillary     Status: Abnormal   Collection Time: 10/20/16  8:05 PM  Result Value Ref Range   Glucose-Capillary 126 (H) 65 - 99 mg/dL   Comment 1 Notify RN   Glucose, capillary     Status: Abnormal   Collection Time: 10/20/16 11:26 PM  Result Value Ref Range   Glucose-Capillary 117 (H) 65 - 99 mg/dL   Comment 1 Capillary Specimen   Procalcitonin     Status: None   Collection Time: 10/21/16  3:18 AM  Result Value Ref Range   Procalcitonin 0.10 ng/mL    Comment:        Interpretation: PCT (Procalcitonin) <= 0.5 ng/mL: Systemic infection (sepsis) is not likely. Local bacterial infection is possible. (NOTE)         ICU PCT Algorithm               Non ICU PCT Algorithm    ----------------------------     ------------------------------         PCT < 0.25 ng/mL                 PCT < 0.1 ng/mL     Stopping of antibiotics            Stopping of antibiotics       strongly encouraged.               strongly encouraged.     ----------------------------     ------------------------------       PCT level decrease by               PCT < 0.25 ng/mL       >= 80% from peak PCT       OR PCT 0.25 - 0.5 ng/mL          Stopping of antibiotics  encouraged.     Stopping of antibiotics           encouraged.    ----------------------------     ------------------------------       PCT level decrease by              PCT >= 0.25 ng/mL       < 80% from peak PCT        AND PCT >= 0.5 ng/mL            Continuin g antibiotics                                              encouraged.       Continuing antibiotics            encouraged.    ----------------------------     ------------------------------     PCT level increase compared          PCT > 0.5 ng/mL         with peak PCT AND          PCT >= 0.5 ng/mL             Escalation of antibiotics                                          strongly encouraged.      Escalation of antibiotics        strongly encouraged.   Basic metabolic panel     Status: Abnormal   Collection Time: 10/21/16  3:18 AM  Result Value Ref Range   Sodium 137 135 - 145 mmol/L   Potassium 3.9 3.5 - 5.1 mmol/L   Chloride 106 101 - 111 mmol/L   CO2 24 22 - 32 mmol/L   Glucose, Bld 151 (H) 65 - 99 mg/dL   BUN 23 (H) 6 - 20 mg/dL   Creatinine, Ser 0.89 0.44 - 1.00 mg/dL   Calcium 8.6 (L) 8.9 - 10.3 mg/dL   GFR calc non Af Amer >60 >60 mL/min   GFR calc Af Amer >60 >60 mL/min    Comment: (NOTE) The eGFR has been calculated using the CKD EPI equation. This calculation has not been validated in all clinical situations. eGFR's persistently <60 mL/min signify possible Chronic Kidney Disease.    Anion gap 7 5 - 15  CBC     Status: Abnormal   Collection Time: 10/21/16  3:18 AM  Result Value Ref Range   WBC 16.7 (H) 4.0 - 10.5 K/uL   RBC 3.02 (L) 3.87 - 5.11 MIL/uL   Hemoglobin 8.5 (L) 12.0 - 15.0 g/dL   HCT 26.1 (L) 36.0 - 46.0 %   MCV 86.4 78.0 - 100.0 fL    MCH 28.1 26.0 - 34.0 pg   MCHC 32.6 30.0 - 36.0 g/dL   RDW 14.4 11.5 - 15.5 %   Platelets 466 (H) 150 - 400 K/uL  Magnesium     Status: None   Collection Time: 10/21/16  3:18 AM  Result Value Ref Range   Magnesium 1.8 1.7 - 2.4 mg/dL  Glucose, capillary     Status: Abnormal   Collection Time: 10/21/16  4:20 AM  Result Value Ref Range   Glucose-Capillary 128 (H) 65 -  99 mg/dL   Comment 1 Notify RN   Glucose, capillary     Status: Abnormal   Collection Time: 10/21/16  8:10 AM  Result Value Ref Range   Glucose-Capillary 122 (H) 65 - 99 mg/dL   Comment 1 Capillary Specimen   Glucose, capillary     Status: Abnormal   Collection Time: 10/21/16 11:42 AM  Result Value Ref Range   Glucose-Capillary 131 (H) 65 - 99 mg/dL   Comment 1 Capillary Specimen   Glucose, capillary     Status: Abnormal   Collection Time: 10/21/16  3:42 PM  Result Value Ref Range   Glucose-Capillary 135 (H) 65 - 99 mg/dL   Comment 1 Capillary Specimen   Glucose, capillary     Status: Abnormal   Collection Time: 10/21/16  7:58 PM  Result Value Ref Range   Glucose-Capillary 114 (H) 65 - 99 mg/dL   Comment 1 Notify RN    Comment 2 Document in Chart   Triglycerides     Status: Abnormal   Collection Time: 10/21/16  8:39 PM  Result Value Ref Range   Triglycerides 179 (H) <150 mg/dL  Glucose, capillary     Status: None   Collection Time: 10/21/16 11:25 PM  Result Value Ref Range   Glucose-Capillary 97 65 - 99 mg/dL   Comment 1 Notify RN    Comment 2 Document in Chart   Glucose, capillary     Status: None   Collection Time: 10/22/16  3:58 AM  Result Value Ref Range   Glucose-Capillary 97 65 - 99 mg/dL   Comment 1 Notify RN    Comment 2 Document in Chart   Basic metabolic panel     Status: Abnormal   Collection Time: 10/22/16  4:20 AM  Result Value Ref Range   Sodium 143 135 - 145 mmol/L   Potassium 3.6 3.5 - 5.1 mmol/L   Chloride 107 101 - 111 mmol/L   CO2 23 22 - 32 mmol/L   Glucose, Bld 88 65 - 99  mg/dL   BUN 24 (H) 6 - 20 mg/dL   Creatinine, Ser 0.99 0.44 - 1.00 mg/dL   Calcium 9.4 8.9 - 10.3 mg/dL   GFR calc non Af Amer >60 >60 mL/min   GFR calc Af Amer >60 >60 mL/min    Comment: (NOTE) The eGFR has been calculated using the CKD EPI equation. This calculation has not been validated in all clinical situations. eGFR's persistently <60 mL/min signify possible Chronic Kidney Disease.    Anion gap 13 5 - 15  CBC     Status: Abnormal   Collection Time: 10/22/16  4:20 AM  Result Value Ref Range   WBC 16.6 (H) 4.0 - 10.5 K/uL   RBC 3.03 (L) 3.87 - 5.11 MIL/uL   Hemoglobin 8.4 (L) 12.0 - 15.0 g/dL   HCT 26.1 (L) 36.0 - 46.0 %   MCV 86.1 78.0 - 100.0 fL   MCH 27.7 26.0 - 34.0 pg   MCHC 32.2 30.0 - 36.0 g/dL   RDW 14.7 11.5 - 15.5 %   Platelets 534 (H) 150 - 400 K/uL  Magnesium     Status: None   Collection Time: 10/22/16  4:20 AM  Result Value Ref Range   Magnesium 1.8 1.7 - 2.4 mg/dL  Phosphorus     Status: None   Collection Time: 10/22/16  4:20 AM  Result Value Ref Range   Phosphorus 3.5 2.5 - 4.6 mg/dL  Glucose, capillary  Status: None   Collection Time: 10/22/16  7:31 AM  Result Value Ref Range   Glucose-Capillary 93 65 - 99 mg/dL   Comment 1 Capillary Specimen    Comment 2 Notify RN   Glucose, capillary     Status: Abnormal   Collection Time: 10/22/16 11:51 AM  Result Value Ref Range   Glucose-Capillary 103 (H) 65 - 99 mg/dL   Comment 1 Capillary Specimen    Comment 2 Notify RN   Glucose, capillary     Status: Abnormal   Collection Time: 10/22/16  3:15 PM  Result Value Ref Range   Glucose-Capillary 111 (H) 65 - 99 mg/dL   Comment 1 Capillary Specimen     Current Facility-Administered Medications  Medication Dose Route Frequency Provider Last Rate Last Dose  . 0.9 %  sodium chloride infusion  250 mL Intravenous PRN Juanito Doom, MD 20 mL/hr at 10/22/16 0400 250 mL at 10/22/16 0400  . acetaminophen (TYLENOL) tablet 650 mg  650 mg Oral Q4H PRN Juanito Doom, MD      . albuterol (PROVENTIL) (2.5 MG/3ML) 0.083% nebulizer solution 2.5 mg  2.5 mg Nebulization Q2H PRN Juanito Doom, MD   2.5 mg at 10/17/16 1231  . aspirin chewable tablet 81 mg  81 mg Per Tube Daily Juanito Doom, MD   81 mg at 10/22/16 1016  . chlorhexidine gluconate (MEDLINE KIT) (PERIDEX) 0.12 % solution 15 mL  15 mL Mouth Rinse BID [name], MD   15 mL at 10/22/16 0800  . Chlorhexidine Gluconate Cloth 2 % PADS 6 each  6 each Topical Daily [name], MD   6 each at 10/22/16 1000  . clonazePAM (KLONOPIN) tablet 1 mg  1 mg Oral TID PRN Riccardo Dubin, MD      . dexmedetomidine (PRECEDEX) 400 MCG/100ML (4 mcg/mL) infusion  0-1.7 mcg/kg/hr Intravenous Continuous Juanito Doom, MD   Stopped at 10/22/16 [phone]  . famotidine (PEPCID) IVPB 20 mg premix  20 mg Intravenous Q12H Rushil Sherrye Payor, MD   Stopped at 10/22/16 1047  . feeding supplement (VITAL HIGH PROTEIN) liquid 1,000 mL  1,000 mL Per Tube Q24H Marshell Garfinkel, MD   Stopped at 10/21/16 0620  . fentaNYL (SUBLIMAZE) injection 100 mcg  100 mcg Intravenous Q2H PRN [name], MD   100 mcg at 10/21/16 2027  . FLUoxetine (PROZAC) 20 MG/5ML solution 20 mg  20 mg Per Tube Daily Carlean Jews, RPH   20 mg at 10/22/16 1016  . heparin injection 5,000 Units  5,000 Units Subcutaneous Q8H Tasrif Ahmed, MD   5,000 Units at 10/22/16 1347  . hydrALAZINE (APRESOLINE) injection 20 mg  20 mg Intravenous Q4H PRN [name], MD   20 mg at 10/21/16 2007  . insulin aspart (novoLOG) injection 1-3 Units  1-3 Units Subcutaneous Q4H Rushil Sherrye Payor, MD   1 Units at 10/21/16 1616  . ipratropium-albuterol (DUONEB) 0.5-2.5 (3) MG/3ML nebulizer solution 3 mL  3 mL Nebulization Q6H [name], MD   3 mL at 10/22/16 1445  . labetalol (NORMODYNE,TRANDATE) injection 20 mg  20 mg Intravenous Q4H [name], MD   20 mg at 10/22/16 1347  . MEDLINE mouth rinse  15 mL Mouth Rinse QID [name], MD    15 mL at 10/22/16 1200  . midazolam (VERSED) injection 2 mg  2 mg Intravenous Q2H PRN Juanito Doom, MD   2 mg at 10/21/16 2039  .  ondansetron (ZOFRAN) injection 4 mg  4 mg Intravenous Q6H PRN Juanito Doom, MD   4 mg at 10/17/16 1455  . polyethylene glycol (MIRALAX / GLYCOLAX) packet 17 g  17 g Per Tube Daily Tasrif Ahmed, MD   17 g at 10/19/16 0919  . predniSONE (DELTASONE) tablet 25 mg  25 mg Per Tube Daily Brand Males, MD   25 mg at 10/22/16 1015   Followed by  . [START ON 10/24/2016] predniSONE (DELTASONE) tablet 15 mg  15 mg Per Tube Daily Brand Males, MD       Followed by  . [START ON 10/26/2016] predniSONE (DELTASONE) tablet 5 mg  5 mg Per Tube Daily Brand Males, MD      . propofol (DIPRIVAN) 1000 MG/100ML infusion  0-50 mcg/kg/min Intravenous Continuous Corey Harold, NP   Stopped at 10/22/16 1215  . sennosides (SENOKOT) 8.8 MG/5ML syrup 5 mL  5 mL Per Tube BID Tasrif Ahmed, MD   5 mL at 10/22/16 1020  . sodium chloride flush (NS) 0.9 % injection 10-40 mL  10-40 mL Intracatheter Q12H [name], MD   10 mL at 10/22/16 1000  . white petrolatum (VASELINE) gel   Topical PRN [name], MD        Musculoskeletal: Strength & Muscle Tone: decreased Gait & Station: unable to stand Patient leans: N/A  Psychiatric Specialty Exam: Physical Exam as per history and physical   ROS patient has mild shortness of breath and getting excited please able to stay calm and cooperative.   Blood pressure (!) 152/139, pulse 88, temperature 98 F (36.7 C), temperature source Oral, resp. rate (!) 26, height '5\' 2"'$  (1.575 m), weight 91.4 kg (201 lb 8 oz), SpO2 95 %.Body mass index is 36.85 kg/m.  General Appearance: Casual  Eye Contact:  Good  Speech:  Able to communicate well by writing on a pad.  Volume:  Not applicable  Mood:  Anxious and Depressed  Affect:  Appropriate and Congruent  Thought Process:  Coherent and Goal Directed  Orientation:  Full (Time, Place,  and Person)  Thought Content:  WDL  Suicidal Thoughts:  No  Homicidal Thoughts:  No  Memory:  Immediate;   Good Recent;   Good Remote;   Good  Judgement:  Intact  Insight:  Good  Psychomotor Activity:  Decreased  Concentration:  Concentration: Fair and Attention Span: Fair  Recall:  Good  Fund of Knowledge:  Good  Language:  Good  Akathisia:  Negative  Handed:  Right  AIMS (if indicated):     Assets:  Communication Skills Desire for Improvement Financial Resources/Insurance Housing Leisure Time Resilience Social Support Transportation Vocational/Educational  ADL's:  Impaired  Cognition:  WNL  Sleep:        Treatment Plan Summary: 56 years old female with a history of depression anxiety presented to the hospital with acute respiratory failure secondary to emphysema and complicated with influenza B pneumonia. Patient reportedly has agitation when trying to wean off from the medication and required intubation in the medical ICU. Patient psychiatric medication fluoxetine and clonazepam was started a few days ago and she has been tolerating well. Patient has no current suicidal/homicidal ideation has no evidence of psychosis.  Impression: Bipolar disorder with moderate depression and no current manic symptoms  Patient has no safety concerns  Patient has no symptoms of agitation or aggressive behavior for the last 24 hours Patient has been tolerating her current therapy and will be coming off ventilation  soon Patient may benefit from mood stabilizers like valproic acid 500 mg twice daily if she become agitated and mood swings - reportedly she has no bipolar or manic symptoms over 5-6 years. Patient may benefit from low dose of clonazepam secondary to respiratory distress  Patient was educated about smoking tobacco and recommended cessation  Appreciate psychiatric consultation and we sign off as of today Please contact [phone] or [phone] if needs further  assistance   Disposition: Patient will be referred to the outpatient medication management when medically cleared. No evidence of imminent risk to self or others at present.   Patient does not meet criteria for psychiatric inpatient admission. Supportive therapy provided about ongoing stressors.  Ambrose Finland, MD 10/22/2016 3:27 PM

## 2016-10-23 ENCOUNTER — Encounter (HOSPITAL_COMMUNITY): Payer: Self-pay | Admitting: *Deleted

## 2016-10-23 ENCOUNTER — Inpatient Hospital Stay (HOSPITAL_COMMUNITY): Payer: BLUE CROSS/BLUE SHIELD

## 2016-10-23 LAB — BASIC METABOLIC PANEL
ANION GAP: 12 (ref 5–15)
BUN: 31 mg/dL — ABNORMAL HIGH (ref 6–20)
CALCIUM: 9.4 mg/dL (ref 8.9–10.3)
CHLORIDE: 106 mmol/L (ref 101–111)
CO2: 26 mmol/L (ref 22–32)
Creatinine, Ser: 1.12 mg/dL — ABNORMAL HIGH (ref 0.44–1.00)
GFR calc non Af Amer: 55 mL/min — ABNORMAL LOW (ref >60)
GLUCOSE: 90 mg/dL (ref 65–99)
Potassium: 3.2 mmol/L — ABNORMAL LOW (ref 3.5–5.1)
Sodium: 144 mmol/L (ref 135–145)

## 2016-10-23 LAB — GLUCOSE, CAPILLARY
GLUCOSE-CAPILLARY: 93 mg/dL (ref 65–99)
GLUCOSE-CAPILLARY: 111 mg/dL — AB (ref 65–99)
Glucose-Capillary: 83 mg/dL (ref 65–99)

## 2016-10-23 LAB — CBC
HCT: 27.3 % — ABNORMAL LOW (ref 36.0–46.0)
HEMOGLOBIN: 9 g/dL — AB (ref 12.0–15.0)
MCH: 28.4 pg (ref 26.0–34.0)
MCHC: 33 g/dL (ref 30.0–36.0)
MCV: 86.1 fL (ref 78.0–100.0)
Platelets: 589 10*3/uL — ABNORMAL HIGH (ref 150–400)
RBC: 3.17 MIL/uL — AB (ref 3.87–5.11)
RDW: 14.9 % (ref 11.5–15.5)
WBC: 15.6 10*3/uL — ABNORMAL HIGH (ref 4.0–10.5)

## 2016-10-23 LAB — MAGNESIUM: MAGNESIUM: 2 mg/dL (ref 1.7–2.4)

## 2016-10-23 LAB — PHOSPHORUS: Phosphorus: 3.7 mg/dL (ref 2.5–4.6)

## 2016-10-23 MED ORDER — INSULIN ASPART 100 UNIT/ML ~~LOC~~ SOLN: 0.0000 [IU] | Freq: Three times a day (TID) | SUBCUTANEOUS | Status: DC

## 2016-10-23 MED ORDER — ASPIRIN 81 MG PO CHEW: 81.0000 mg | CHEWABLE_TABLET | Freq: Every day | ORAL | Status: DC

## 2016-10-23 MED ORDER — POTASSIUM CHLORIDE 20 MEQ/15ML (10%) PO SOLN: 40.0000 meq | Freq: Two times a day (BID) | ORAL | Status: DC

## 2016-10-23 MED ORDER — POLYETHYLENE GLYCOL 3350 17 G PO PACK
17.0000 g | PACK | Freq: Every day | ORAL | Status: DC
  Filled 2016-10-23 (×3): qty 1

## 2016-10-23 MED ORDER — PREDNISONE 5 MG PO TABS: 5.0000 mg | ORAL_TABLET | Freq: Every day | ORAL | Status: DC

## 2016-10-23 MED ORDER — FLUOXETINE HCL 20 MG PO CAPS
20.0000 mg | ORAL_CAPSULE | Freq: Every day | ORAL | Status: DC
  Administered 2016-10-23 – 2016-10-25 (×3): 20 mg via ORAL
  Filled 2016-10-23 (×3): qty 1

## 2016-10-23 MED ORDER — POTASSIUM CHLORIDE 20 MEQ/15ML (10%) PO SOLN
40.0000 meq | Freq: Once | ORAL | Status: AC
  Administered 2016-10-23: 40 meq via ORAL
  Filled 2016-10-23: qty 30

## 2016-10-23 MED ORDER — PREDNISONE 10 MG PO TABS
15.0000 mg | ORAL_TABLET | Freq: Every day | ORAL | Status: AC
  Administered 2016-10-24: 15 mg via ORAL
  Filled 2016-10-23: qty 1

## 2016-10-23 MED ORDER — PREDNISONE 20 MG PO TABS
25.0000 mg | ORAL_TABLET | Freq: Once | ORAL | Status: AC
  Administered 2016-10-23: 25 mg via ORAL
  Filled 2016-10-23: qty 1

## 2016-10-23 MED ORDER — ENOXAPARIN SODIUM 40 MG/0.4ML ~~LOC~~ SOLN
40.0000 mg | SUBCUTANEOUS | Status: DC
  Administered 2016-10-23 – 2016-10-24 (×2): 40 mg via SUBCUTANEOUS
  Filled 2016-10-23 (×2): qty 0.4

## 2016-10-23 MED ORDER — SENNOSIDES 8.8 MG/5ML PO SYRP
5.0000 mL | ORAL_SOLUTION | Freq: Two times a day (BID) | ORAL | Status: DC
  Filled 2016-10-23: qty 5

## 2016-10-23 MED ORDER — ORAL CARE MOUTH RINSE: 15.0000 mL | Freq: Two times a day (BID) | OROMUCOSAL | Status: DC

## 2016-10-23 MED ORDER — ASPIRIN EC 81 MG PO TBEC
81.0000 mg | DELAYED_RELEASE_TABLET | Freq: Every day | ORAL | Status: DC
  Administered 2016-10-23 – 2016-10-25 (×3): 81 mg via ORAL
  Filled 2016-10-23 (×3): qty 1

## 2016-10-23 MED ORDER — FUROSEMIDE 10 MG/ML IJ SOLN: 40.0000 mg | Freq: Once | INTRAMUSCULAR | Status: DC

## 2016-10-23 MED ORDER — PREDNISONE 5 MG PO TABS: 15.0000 mg | ORAL_TABLET | Freq: Every day | ORAL | Status: DC

## 2016-10-23 MED ORDER — PREDNISONE 5 MG PO TABS
5.0000 mg | ORAL_TABLET | Freq: Every day | ORAL | Status: AC
  Administered 2016-10-25: 5 mg via ORAL
  Filled 2016-10-23: qty 1

## 2016-10-23 NOTE — Progress Notes (Signed)
Physical Therapy Treatment Patient Details Name: Rachel [name] MRN: [id number] DOB: August 02, 1960 Today's Date: 10/23/2016    History of Present Illness 56 yo admitted with respiratory failure and flu B. Pt intubated 4/14-4/21, reintubated 4/21-4/26. PMHx: emphysema, bipolar    PT Comments    Pt pleasant and very eager to walk and return home. Pt with good mobility overall but decreased ability to maintain oxygen saturation with activity. Pt dropping to 80% with walking despite increased O2 delivery to 6L. Pt educated for deficits, transfers, gait and balance and reports she lives in a 2nd story apt and son works and is unable to provide 24hr assist. Pt will need to be supervision level at least prior to returning home. Pt encouraged to mobilize daily with nursing assist.    Follow Up Recommendations  Supervision/Assistance - 24 hour;SNF     Equipment Recommendations  Rolling walker with 5" wheels    Recommendations for Other Services OT consult     Precautions / Restrictions Precautions Precautions: Fall Precaution Comments: watch sats    Mobility  Bed Mobility Overal bed mobility: Needs Assistance Bed Mobility: Supine to Sit     Supine to sit: Min guard;HOB elevated     General bed mobility comments: HOB 30 degrees, increased time and cues with supervision for lines  Transfers Overall transfer level: Needs assistance   Transfers: Sit to/from Stand Sit to Stand: Min assist         General transfer comment: cues for hand placement and safety, pt sits prematurely without reaching for surface on initial trial  Ambulation/Gait Ambulation/Gait assistance: Min assist;+2 safety/equipment Ambulation Distance (Feet): 30 Feet Assistive device: Rolling walker (2 wheeled) Gait Pattern/deviations: Step-through pattern;Decreased stride length;Trunk flexed   Gait velocity interpretation: Below normal speed for age/gender General Gait Details: pt walked 30' then 25' with RW  and chair to follow with cues for posture, position in RW and safety. Pt with sats dropping to 80% on 2 trials with 4 and 6L O2. HR 95   Stairs            Wheelchair Mobility    Modified Rankin (Stroke Patients Only)       Balance Overall balance assessment: Needs assistance   Sitting balance-Leahy Scale: Good       Standing balance-Leahy Scale: Poor                              Cognition Arousal/Alertness: Awake/alert Behavior During Therapy: WFL for tasks assessed/performed Overall Cognitive Status: Impaired/Different from baseline Area of Impairment: Safety/judgement                         Safety/Judgement: Decreased awareness of deficits;Decreased awareness of safety     General Comments: pt continues to state she is going home tomorrow despite inability to perform functional mobility and maintain sats      Exercises      General Comments        Pertinent Vitals/Pain Pain Assessment: No/denies pain    Home Living Family/patient expects to be discharged to:: Private residence Living Arrangements: Children Available Help at Discharge: Family;Available PRN/intermittently Type of Home: Apartment Home Access: Stairs to enter Entrance Stairs-Rails: Right;Left Home Layout: One level Home Equipment: None      Prior Function Level of Independence: Independent          PT Goals (current goals can now be found in the care plan  section) Progress towards PT goals: Progressing toward goals    Frequency           PT Plan Discharge plan needs to be updated    Co-evaluation             End of Session Equipment Utilized During Treatment: Gait belt;Oxygen Activity Tolerance: Patient tolerated treatment well Patient left: in chair;with call bell/phone within reach;with nursing/sitter in room;with chair alarm set Nurse Communication: Mobility status;Precautions PT Visit Diagnosis: Difficulty in walking, not elsewhere  classified (R26.2);Unsteadiness on feet (R26.81)     Time: 6734-1937 PT Time Calculation (min) (ACUTE ONLY): 28 min  Charges:  $Gait Training: 8-22 mins $Therapeutic Activity: 8-22 mins                    G Codes:       [name], PT [phone]    [place] 10/23/2016, 10:41 AM

## 2016-10-23 NOTE — Progress Notes (Addendum)
Patient trasfered from 74M to 5W02 via wheelchair; alert and oriented x 4; no complaints of pain; IV saline locked in LFA; dressing on left neck ( IJ was remove prior to arrival). Orient patient to room and unit; instructed how to use the call bell and  fall risk precautions. Will continue to monitor the patient.

## 2016-10-23 NOTE — Progress Notes (Signed)
PULMONARY / CRITICAL CARE MEDICINE   Name: Rachel [name]  MRN: [id number] DOB: [birth year]    ADMISSION DATE:  10/10/2016 CONSULTATION DATE:  10/23/2016   REFERRING MD:  Alean Rinne from [place]:  Respiratory failure   BRIEF:  56 y/o female with emphysema admitted with Influenza B pneumonia/ARDS.  SUBJECTIVE: She was successfully extubated yesterday and is resting comfortably this morning.   VITAL SIGNS: BP (!) 152/72   Pulse 77   Temp 98.2 F (36.8 C) (Oral)   Resp (!) 22   Ht 5\' 2"  (1.575 m)   Wt 86.9 kg (191 lb 9.3 oz)   SpO2 95%   BMI 35.04 kg/m   HEMODYNAMICS:    VENTILATOR SETTINGS: Vent Mode: CPAP;PSV FiO2 (%):  [40 %-55 %] 45 % PEEP:  [5 cmH20] 5 cmH20 Pressure Support:  [5 cmH20-10 cmH20] 5 cmH20  INTAKE / OUTPUT: I/O last 3 completed shifts: In: 1083.6 [P.O.:60; I.V.:813.6; NG/GT:60; IV Piggyback:150] Out: 4610 [Urine:4610]  PHYSICAL EXAMINATION: Physical Exam  Constitutional: She is oriented to person, place, and time. No distress.  HENT:  Head: Normocephalic and atraumatic.  Eyes: Conjunctivae are normal. No scleral icterus.  Cardiovascular: Normal rate and regular rhythm.   Pulmonary/Chest: Effort normal. No respiratory distress.  Abdominal: Soft. She exhibits no distension. There is no tenderness.  Neurological: She is alert and oriented to person, place, and time.  Skin: She is not diaphoretic.    BMET  Recent Labs Lab 10/21/16 0318 10/22/16 0420 10/23/16 0500  NA 137 143 144  K 3.9 3.6 3.2*  CL 106 107 106  CO2 24 23 26   BUN 23* 24* 31*  CREATININE 0.89 0.99 1.12*  GLUCOSE 151* 88 90    Electrolytes  Recent Labs Lab 10/17/16 0422  10/21/16 0318 10/22/16 0420 10/23/16 0500  CALCIUM 9.2  < > 8.6* 9.4 9.4  MG 2.6*  < > 1.8 1.8 2.0  PHOS 3.8  --   --  3.5 3.7  < > = values in this interval not displayed.  CBC  Recent Labs Lab 10/21/16 0318 10/22/16 0420 10/23/16 0500  WBC 16.7* 16.6*  15.6*  HGB 8.5* 8.4* 9.0*  HCT 26.1* 26.1* 27.3*  PLT 466* 534* 589*    Coag's No results for input(s): APTT, INR in the last 168 hours.  Sepsis Markers  Recent Labs Lab 10/17/16 1526 10/19/16 1120 10/20/16 0305 10/21/16 0318  LATICACIDVEN 1.3  --   --   --   PROCALCITON  --  <0.10 0.10 0.10    ABG  Recent Labs Lab 10/17/16 1304 10/17/16 1559  PHART 7.491* 7.443  PCO2ART 42.3 51.0*  PO2ART 56.0* 169.0*    Glucose  Recent Labs Lab 10/22/16 0731 10/22/16 1151 10/22/16 1515 10/22/16 1942 10/22/16 2345 10/23/16 0329  GLUCAP 93 103* 111* 106* 99 83    Imaging Dg Chest Port 1 View  Result Date: 10/23/2016 CLINICAL DATA:  Respiratory failure. EXAM: PORTABLE CHEST 1 VIEW COMPARISON:  10/22/2016 . FINDINGS: Interim extubation and removal of NG tube. Left IJ line stable position. Heart size stable. Low lung volumes with basilar atelectasis and infiltrates/edema again noted. Slight improvement. Small left pleural effusion. No pneumothorax . IMPRESSION: 1. Interim extubation removal of NG tube. Left IJ line stable position. 2. Low lung volumes with basilar atelectasis and infiltrates/edema again noted. Slight improvement prior exam. Small left pleural effusion. Electronically Signed   By: Marcello Moores  Register   On: 10/23/2016 07:14    STUDIES:  CXR 4/16:  Right perihilar and left basilar infiltration or consolidation. CXR 4/17 and 4/18 - patchy b/l infiltrates CXR 4/19 persistent b/l infiltrates Ab XR 4/19 NG tube tip in distal stomach Ab XR 4/22 NG tube tip in distal stomach   CULTURES: 4/14 resp culture > normal flora MRSA nasal swab neg. 4/14 urine culture >>negative. 4/14 blood culture  >> negative 4/14 influenza pcr panel > POSITIVE INFLUENZA B 4/14 urine strep > neg. 4/14 urine leg >    ANTIBIOTICS: 4/13 levaquin > 4/14 4/14 Azithromycin > 4/16 4/14 Vancomycin > 4/17 4/13 Tamiflu > 4/18 4/14 Ceftriaxone > 4/20  SIGNIFICANT EVENTS: 4/13 admission  [place] 4/14 intubation, transfer to [facility] 4/15 - remains intubated 4/17 - on vent, improving, diuresed well  4/21 extubated but reintubated ( agitation, psych?) 4/24 propofol -> Precedex 4/25 More confused  4/26 Precedex -> propofol for worsening agitation  LINES/TUBES: 4/14 ETT > 4/21 4/21 ETT >4/26 [facility] CVL PIV  DISCUSSION: 56 y/o female with a past medical history of presumed COPD based on her smoking history who is here now with acute respiratory failure with hypoxemia secondary to influenza B pneumonia.    ASSESSMENT / PLAN:  PULMONARY A: Acute respiratory failure with hypoxemia from influenza and super bacterial infection  with ARDS Presumed COPD with wheezing improving with bronchodilators and steroid  Tobacco abuse P:   Continue bronchodilators and steroid taper Continue incentive spirometry Consult PT to mobilize  CARDIOVASCULAR A:  Demand ischemia Septic shock with Hypotension requiring pressor - off pressor 6/19 Grd 2 diastolic dysfunction Hypertension P:  Continue labetatol 20 mg IV and hydralazine 20 mg as needed  RENAL A:   AKI, likely from ATN: Resolved Hypokalemia: K 3.2. Suspect from vomiting and insensible losses. P:   Hold diuresis given BUN/Crt increase Follow BMET Replete K/Mg  GASTROINTESTINAL A:   GERD P:   Continue famotidine to 20 mg twice daily for GERD  HEMATOLOGIC A:   Normocytic anemia: Hb 9-10 though now trending 8 over last two days. No signs of overt blood loss other than bloody sputum described above. Leukocytosis: Stable at 15-16 P:  Sub q hep  INFECTIOUS A:   Influenza B (by report, can't see lab result from outside hospital) with possible bacterial superinfection: s/p antimicrobial therapy. PCT reassuring. P:   Continue assessing  ENDOCRINE A:   Hyperglycemia: steroid effect P:   Monitor glucose  NEUROLOGIC A:   Sedation needs for vent synchrony Depression, anxiety: History of bipolar disorder per  family yesterday. Psychiatry did not recommend additional pharmacotherapy. P:   RASS goal: 0 Continue home clonazepam and fluoxetine  Charlott Rakes, [facility] Pager: [phone]  10/23/2016 7:24 AM   Attending Note:  56 year old female with ARDS due to influenza B who required intubation.  COPD by history.  Depression and anxiety.  Patient was extubated on 4/26 and is working with PT/OT.  Currently on 4L and tolerating it well.  On exam, decreased BS diffusely.  I reviewed CXR with low lung volume.  Discussed with PCCM-resident and [facility]-MD.  Acute respiratory failure:  - D/C vent from room  - Monitor closely.  Hypoxemia:  - Titrate O2 for sat of 88-92%.  - Will need ambulatory desat study prior to discharge  COPD:  - Steroid taper  - Bronchodilators as ordered.  Psych:  - Klonipin  - Prozac  Transfer to tele and to [facility] service with PCCM off 4/28.  Patient seen and examined, agree with above note.  I dictated  the care and orders written for this patient under my direction.  Rush Farmer, MD [phone]

## 2016-10-23 NOTE — Progress Notes (Signed)
Tried to get PIV on pt x3 with no success. Put in 2 IV team consults. Will send pt to 5W with central line with hopes IV team can get PIV. Reported this off to [employer].

## 2016-10-23 NOTE — Progress Notes (Signed)
Report called to 5W. Pt to be transferred via wheelchair and nurse. VS WNL.

## 2016-10-24 DIAGNOSIS — F3132 Bipolar disorder, current episode depressed, moderate: Secondary | ICD-10-CM

## 2016-10-24 LAB — CBC
HCT: 29.7 % — ABNORMAL LOW (ref 36.0–46.0)
Hemoglobin: 9.5 g/dL — ABNORMAL LOW (ref 12.0–15.0)
MCH: 27.9 pg (ref 26.0–34.0)
MCHC: 32 g/dL (ref 30.0–36.0)
MCV: 87.4 fL (ref 78.0–100.0)
Platelets: 599 10*3/uL — ABNORMAL HIGH (ref 150–400)
RBC: 3.4 MIL/uL — AB (ref 3.87–5.11)
RDW: 15 % (ref 11.5–15.5)
WBC: 16.7 10*3/uL — AB (ref 4.0–10.5)

## 2016-10-24 LAB — BASIC METABOLIC PANEL
Anion gap: 10 (ref 5–15)
BUN: 28 mg/dL — AB (ref 6–20)
CHLORIDE: 109 mmol/L (ref 101–111)
CO2: 22 mmol/L (ref 22–32)
Calcium: 9.3 mg/dL (ref 8.9–10.3)
Creatinine, Ser: 0.96 mg/dL (ref 0.44–1.00)
GFR calc non Af Amer: >60 mL/min (ref >60)
Glucose, Bld: 100 mg/dL — ABNORMAL HIGH (ref 65–99)
Potassium: 3.6 mmol/L (ref 3.5–5.1)
SODIUM: 141 mmol/L (ref 135–145)

## 2016-10-24 LAB — MAGNESIUM: MAGNESIUM: 1.9 mg/dL (ref 1.7–2.4)

## 2016-10-24 MED ORDER — IPRATROPIUM-ALBUTEROL 0.5-2.5 (3) MG/3ML IN SOLN
3.0000 mL | Freq: Three times a day (TID) | RESPIRATORY_TRACT | Status: DC
  Administered 2016-10-25: 3 mL via RESPIRATORY_TRACT
  Filled 2016-10-24 (×2): qty 3

## 2016-10-24 NOTE — Progress Notes (Signed)
PROGRESS NOTE    Rachel [name]  XTG:[id number]  DOB: March 09, 1961  DOA: 10/10/2016 PCP: [name]:   Hospital course: STUDIES:  CXR 4/16: Right perihilar and left basilar infiltration or consolidation. CXR 4/17 and 4/18 - patchy b/l infiltrates CXR 4/19 persistent b/l infiltrates Ab XR 4/19 NG tube tip in distal stomach Ab XR 4/22 NG tube tip in distal stomach   CULTURES: 4/14 resp culture > normal flora MRSA nasal swab neg. 4/14 urine culture >>negative. 4/14 blood culture  >> negative 4/14 influenza pcr panel > POSITIVE INFLUENZA B 4/14 urine strep > neg. 4/14 urine leg >    ANTIBIOTICS: 4/13 levaquin > 4/14 4/14 Azithromycin > 4/16 4/14 Vancomycin > 4/17 4/13 Tamiflu > 4/18 4/14 Ceftriaxone > 4/20  SIGNIFICANT EVENTS: 4/13 admission [place] 4/14 intubation, transfer to [facility] 4/15 - remains intubated 4/17 - on vent, improving, diuresed well  4/21 extubated but reintubated ( agitation, psych?) 4/24 propofol -> Precedex 4/25 More confused  4/26 Precedex -> propofol for worsening agitation  LINES/TUBES: 4/14 ETT > 4/21 4/21 ETT >4/26 [facility] CVL PIV  DISCUSSION: 56 y/o female with a past medical history of presumed COPD based on her smoking history who is here now with acute respiratory failure with hypoxemia secondary to influenza B pneumonia.    ASSESSMENT / PLAN:   Acute respiratory failure with hypoxemia from influenza and super bacterial infection  with ARDS Presumed COPD with wheezing improving with bronchodilators and steroid  Tobacco abuse P:   Continue bronchodilators and steroid taper Continue incentive spirometry Consult PT to mobilize, they are recommending 24 hr supervision or SNF, pt declining SNF at this time  CARDIOVASCULAR A:  Demand ischemia Septic shock with Hypotension requiring pressor - off pressor 2/70 Grd 2 diastolic dysfunction Hypertension P:  Continue labetatol 20 mg IV  and hydralazine 20 mg as needed  AKI, likely from ATN: Resolved Hypokalemia: K 3.2. Suspect from vomiting and insensible losses. P:   Hold diuresis given BUN/Crt increase Follow BMET Replete K/Mg  GASTROINTESTINAL A:   GERD P:   Continue famotidine to 20 mg twice daily for GERD and GI protection  HEMATOLOGIC A:   Normocytic anemia: Hb 9-10 though now trending 8 over last two days. No signs of overt blood loss other than bloody sputum described above. Leukocytosis: Stable at 15-16 P:  Sub q hep  INFECTIOUS A:   Influenza B (by report, can't see lab result from outside hospital) with possible bacterial superinfection: s/p antimicrobial therapy. PCT reassuring. P:   Continue assessing  ENDOCRINE A:   Hyperglycemia: steroid effect P:   Monitor glucose  NEUROLOGIC A:   Sedation needs for vent synchrony Depression, anxiety: History of bipolar disorder per family yesterday. Psychiatry did not recommend additional pharmacotherapy. P:   RASS goal: 0 Continue home clonazepam and fluoxetine   DVT prophylaxis: enoxaparin Code Status: FULL Family Communication: at bedside Disposition Plan: Home with [facility] services   Subjective: Pt reports that she is unsteady on feet at times but overall much improved  Objective: Vitals:   10/23/16 2103 10/24/16 0520 10/24/16 0718 10/24/16 1356  BP: (!) 158/81 (!) 158/80    Pulse: 80 87    Resp: 19 19    Temp: 99 F (37.2 C) 98.5 F (36.9 C)    TempSrc: Oral Oral    SpO2: 97% 99% 97% 93%  Weight:  85.6 kg (188 lb 11.4 oz)    Height:        Intake/Output Summary (  Last 24 hours) at 10/24/16 1425 Last data filed at 10/24/16 0530  Gross per 24 hour  Intake              240 ml  Output              800 ml  Net             -560 ml   Filed Weights   10/22/16 0428 10/23/16 0200 10/24/16 0520  Weight: 91.4 kg (201 lb 8 oz) 86.9 kg (191 lb 9.3 oz) 85.6 kg (188 lb 11.4 oz)    Exam:  Constitutional: She is oriented to person,  place, and time. No distress.  HENT: Head: Normocephalic and atraumatic.  Eyes: Conjunctivae are normal. No scleral icterus.  Cardiovascular: Normal rate and regular rhythm.   Pulmonary/Chest: Effort normal. No respiratory distress. Good air movement.  Abdominal: Soft. She exhibits no distension. There is no tenderness.  Neurological: She is alert and oriented to person, place, and time.  Skin: She is not diaphoretic. No rash or lesion seen.   Data Reviewed: Basic Metabolic Panel:  Recent Labs Lab 10/20/16 0305 10/21/16 0318 10/22/16 0420 10/23/16 0500 10/24/16 0446  NA 139 137 143 144 141  K 3.4* 3.9 3.6 3.2* 3.6  CL 104 106 107 106 109  CO2 26 24 23 26 22   GLUCOSE 117* 151* 88 90 100*  BUN 28* 23* 24* 31* 28*  CREATININE 1.03* 0.89 0.99 1.12* 0.96  CALCIUM 8.5* 8.6* 9.4 9.4 9.3  MG 1.9 1.8 1.8 2.0 1.9  PHOS  --   --  3.5 3.7  --    Liver Function Tests:  Recent Labs Lab 10/18/16 0800  AST 26  ALT 34  ALKPHOS 83  BILITOT 0.5  PROT 6.1*  ALBUMIN 2.5*   No results for input(s): LIPASE, AMYLASE in the last 168 hours. No results for input(s): AMMONIA in the last 168 hours. CBC:  Recent Labs Lab 10/20/16 0305 10/21/16 0318 10/22/16 0420 10/23/16 0500 10/24/16 0446  WBC 17.4* 16.7* 16.6* 15.6* 16.7*  HGB 8.3* 8.5* 8.4* 9.0* 9.5*  HCT 25.7* 26.1* 26.1* 27.3* 29.7*  MCV 86.5 86.4 86.1 86.1 87.4  PLT 494* 466* 534* 589* 599*   Cardiac Enzymes: No results for input(s): CKTOTAL, CKMB, CKMBINDEX, TROPONINI in the last 168 hours. CBG (last 3)   Recent Labs  10/23/16 0329 10/23/16 0736 10/23/16 1138  GLUCAP 83 93 111*   Recent Results (from the past 240 hour(s))  Culture, respiratory (NON-Expectorated)     Status: None   Collection Time: 10/19/16  3:02 PM  Result Value Ref Range Status   Specimen Description TRACHEAL ASPIRATE  Final   Special Requests Normal  Final   Gram Stain   Final    MODERATE WBC PRESENT, PREDOMINANTLY PMN NO ORGANISMS SEEN     Culture Consistent with normal respiratory flora.  Final   Report Status 10/21/2016 FINAL  Final     Studies: Dg Chest Port 1 View  Result Date: 10/23/2016 CLINICAL DATA:  Respiratory failure. EXAM: PORTABLE CHEST 1 VIEW COMPARISON:  10/22/2016 . FINDINGS: Interim extubation and removal of NG tube. Left IJ line stable position. Heart size stable. Low lung volumes with basilar atelectasis and infiltrates/edema again noted. Slight improvement. Small left pleural effusion. No pneumothorax . IMPRESSION: 1. Interim extubation removal of NG tube. Left IJ line stable position. 2. Low lung volumes with basilar atelectasis and infiltrates/edema again noted. Slight improvement prior exam. Small left pleural effusion. Electronically Signed  By: [place]   On: 10/23/2016 07:14   Scheduled Meds: . aspirin EC  81 mg Oral Daily  . Chlorhexidine Gluconate Cloth  6 each Topical Daily  . enoxaparin (LOVENOX) injection  40 mg Subcutaneous Q24H  . FLUoxetine  20 mg Oral Daily  . ipratropium-albuterol  3 mL Nebulization Q6H  . mouth rinse  15 mL Mouth Rinse BID  . polyethylene glycol  17 g Oral Daily  . [START ON 10/25/2016] predniSONE  5 mg Oral Q breakfast  . sodium chloride flush  10-40 mL Intracatheter Q12H   Continuous Infusions: . sodium chloride 250 mL (10/22/16 2000)    Principal Problem:   ARDS (adult respiratory distress syndrome) (HCC) Active Problems:   Influenza B   Acute on chronic respiratory failure with hypoxemia ([place])  Time spent:   Irwin Brakeman, MD, FAAFP [facility] Pager [phone]  If 7PM-7AM, please contact night-coverage [email or web address] Password [email or web address] 10/24/2016, 2:25 PM    LOS: 14 days

## 2016-10-25 MED ORDER — IPRATROPIUM-ALBUTEROL 0.5-2.5 (3) MG/3ML IN SOLN: 3.0000 mL | Freq: Three times a day (TID) | RESPIRATORY_TRACT | 0 refills | Status: DC

## 2016-10-25 MED ORDER — ASPIRIN 81 MG PO TBEC: 81.0000 mg | DELAYED_RELEASE_TABLET | Freq: Every day | ORAL | Status: DC

## 2016-10-25 NOTE — Progress Notes (Signed)
SATURATION QUALIFICATIONS: (This note is used to comply with regulatory documentation for home oxygen)  Patient Saturations on Room Air at Rest = 88%  Patient Saturations on Room Air while Ambulating =83%  Patient Saturations on 6 Liters of oxygen while Ambulating = 91%  Please briefly explain why patient needs home oxygen:

## 2016-10-25 NOTE — Discharge Summary (Signed)
Physician Discharge Summary  Rachel [name] KDX:[id number] DOB: 04/26/1961 DOA: 10/10/2016  PCP: Rachel [name]  Admit date: 10/10/2016 Discharge date: 10/25/2016  Admitted From: Home  Disposition:  Home with daughter and home health services, refuses SNF  Recommendations for Outpatient Follow-up:  1. Follow up with PCP in 3-5 days for recheck 2. Please refer to outpatient pulmonologist 3. Please obtain BMP in 5-7 days   Home Health: YES  Equipment/Devices: oxygen and nebulizer, wheelchair, bedside commode, rolling walker  Discharge Condition: STABLE  CODE STATUS: FULL   Brief/Interim Summary: CHIEF COMPLAINT:  Respiratory failure   HISTORY OF PRESENT ILLNESS:   56 y/o smoker with a past medical history of emphysema admitted on 4/14 from [facility] after she was admitted there on 4/13 for Acute respiratory failure with hypoxemia secondary to Influenza B pneumonia.  She smoked 2 ppd and has done so for at least 30 years.  Her family provides the history because she was intubated and sedated on my exam.  They note that for the last two weeks she has been experiencing cough, dyspnea and feeling bad.  At [facility] she was reportedly found to have influenza B.  Her hypoxemia and dyspnea progressed to the point that she was requiring intubation on 4/14. Transfer to our facility was requested.    PLEASE SEE HOSPITAL NOTES AND RECORDS FOR FULL DETAILS OF THIS LONG HOSPITALIZATION Hospital course: STUDIES: CXR 4/16: Right perihilar and left basilar infiltration or consolidation. CXR 4/17 and 4/18 - patchy b/l infiltrates CXR 4/19 persistent b/l infiltrates Ab XR 4/19 NG tube tip in distal stomach Ab XR 4/22 NG tube tip in distal stomach   CULTURES: 4/14 resp culture > normal flora MRSA nasal swab neg. 4/14 urine culture >>negative. 4/14 blood culture >>negative 4/14 influenza pcr panel > POSITIVE INFLUENZA B 4/14 urine strep > neg. 4/14 urine leg  >   ANTIBIOTICS: 4/13 levaquin > 4/14 4/14 Azithromycin > 4/16 4/14 Vancomycin > 4/17 4/13 Tamiflu > 4/18 4/14 Ceftriaxone > 4/20  SIGNIFICANT EVENTS: 4/13 admission [place] 4/14 intubation, transfer to [facility] 4/15 - remains intubated 4/17 - on vent, improving, diuresed well  4/21 extubated but reintubated ( agitation, psych?) 4/24 propofol -> Precedex 4/25 More confused  4/26 Precedex -> propofol for worsening agitation  LINES/TUBES: 4/14 ETT > 4/21 4/21 ETT >4/26 [facility] CVL PIV  DISCUSSION: 56 y/o female with a past medical history of presumed COPD based on her smoking history who is here now with acute respiratory failure with hypoxemia secondary to influenza B pneumonia.   ASSESSMENT / PLAN:   Acute respiratory failure with hypoxemia from influenza and super bacterial infection with ARDS (resolved now) Presumed COPD with wheezing improving with bronchodilators and steroid  Tobacco abuse Continue bronchodilators and has completed steroid taper 10/25/16 Continue incentive spirometry Consult PT to mobilize, they are recommending 24 hr supervision or SNF, pt declining SNF at this time, she will go home with daughter with Home health services.  Strongly advised patient to avoid smoking and using tobacco and second hand smoke.  Discharging home on home oxygen 3L/min, home nebulizer and bronchodilator therapy, close follow up with PCP.  Recommend outpatient pulmonology referral.   CARDIOVASCULAR A:  Demand ischemia Septic shock with Hypotension requiring pressor - off pressor 9/76 Grd 2 diastolic dysfunction Hypertension Resume home Zestoretic at discharge  AKI, likely from [facility]: Resolved Hypokalemia: K 3.2. Suspect from vomiting and insensible losses. Repleted  GASTROINTESTINAL GERD Treated with famotidine for GERD and GI protection  HEMATOLOGIC  Normocytic anemia: Hb 9-10 though now trending 8 over last two days. No signs of overt blood loss other  than bloody sputum described above. Leukocytosis: Stable at 15-16, on steroids, but discontinue them 4/29.     INFECTIOUS A:  Influenza B (by report, can't see lab result from outside hospital) with possible bacterial superinfection: s/p antimicrobial therapy. PCT reassuring. Completed treatment  ENDOCRINE A:  Hyperglycemia: steroid effect P: Monitor glucose, much improved, steroids tapered to off 4/29  NEUROLOGIC A:  Sedation needs for vent synchrony Depression, anxiety: History of bipolar disorder per family yesterday. Psychiatry did not recommend additional pharmacotherapy. P: Continue home clonazepam and fluoxetine  DVT prophylaxis: enoxaparin Code Status: FULL Family Communication: at bedside Disposition Plan: Home with [facility] services  Discharge Diagnoses:  Principal Problem:   ARDS (adult respiratory distress syndrome) ([place]) Active Problems:   Influenza B   Acute on chronic respiratory failure with hypoxemia [facility])  Discharge Instructions  Discharge Instructions    Diet - low sodium heart healthy    Complete by:  As directed    Increase activity slowly    Complete by:  As directed      Allergies as of 10/25/2016      Reactions   Hydrocodone Nausea And Vomiting   Oxycodone Nausea And Vomiting   Codeine, hydrocodone, most pain pills.   Penicillins Itching, Nausea And Vomiting   Has patient had a PCN reaction causing immediate rash, facial/tongue/throat swelling, SOB or lightheadedness with hypotension: Yes, around the eyes Has patient had a PCN reaction causing severe rash involving mucus membranes or skin necrosis: No Has patient had a PCN reaction that required hospitalization: No Has patient had a PCN reaction occurring within the last 10 years: No  If all of the above answers are "NO", then may proceed with Cephalosporin use.   Precedex [dexmedetomidine Hcl In Nacl]    Increased agitation/confusion in ICU [April 2018]   Seroquel [quetiapine]     More agitated/confused per daughter Rachel [name] when given in the past.      Medication List    STOP taking these medications   ibuprofen 200 MG tablet Commonly known as:  ADVIL,MOTRIN   levofloxacin 500 MG tablet Commonly known as:  LEVAQUIN     TAKE these medications   aspirin 81 MG EC tablet Take 1 tablet (81 mg total) by mouth daily. Start taking on:  10/26/2016   clonazePAM 1 MG tablet Commonly known as:  KLONOPIN Take 1 mg by mouth 3 (three) times daily as needed for anxiety.   FLUoxetine 20 MG capsule Commonly known as:  PROZAC Take 20 mg by mouth daily.   ipratropium-albuterol 0.5-2.5 (3) MG/3ML Soln Commonly known as:  DUONEB Take 3 mLs by nebulization 3 (three) times daily.   lisinopril-hydrochlorothiazide 20-25 MG tablet Commonly known as:  PRINZIDE,ZESTORETIC Take 1 tablet by mouth daily.   PROAIR HFA 108 (90 Base) MCG/ACT inhaler Generic drug:  albuterol Inhale 2 puffs into the lungs every 6 (six) hours. Scheduled doses.   vitamin B-12 100 MCG tablet Commonly known as:  CYANOCOBALAMIN Take 100 mcg by mouth daily.            Durable Medical Equipment        Start     Ordered   10/25/16 1148  For home use only DME Nebulizer/meds  Once    Question:  Patient needs a nebulizer to treat with the following condition  Answer:  Chronic respiratory failure ([place])   10/25/16 1147  10/25/16 1025  For home use only DME oxygen  Once    Question Answer Comment  Mode or (Route) Nasal cannula   Liters per Minute 3   Frequency Continuous (stationary and portable oxygen unit needed)   Oxygen conserving device Yes   Oxygen delivery system Gas      10/25/16 1024   10/25/16 1024  For home use only DME standard manual wheelchair with seat cushion  Once    Comments:  Patient suffers from gait instability and chronic respiratory failure which impairs their ability to perform daily activities like bathing and dressing in the home.  A cane, crutch or walker will not  resolve  issue with performing activities of daily living. A wheelchair will allow patient to safely perform daily activities. Patient can safely propel the wheelchair in the home or has a caregiver who can provide assistance.  Accessories: elevating leg rests (ELRs), wheel locks, extensions and anti-tippers.   10/25/16 1024   10/24/16 1713  For home use only DME Walker rolling  Once    Question:  Patient needs a walker to treat with the following condition  Answer:  Gait instability   10/24/16 [place] [facility] Follow up.   Why:  bedside commode Contact information: [place] [address] [phone]        [facility] Follow up.   Why:  Physical Therapy, Occupational Therapy, Registered Nurse, Nurse Aide, Respiratory Therapy and Social Work Contact information: [address] [place] [address] [phone]        Rachel [name]. Schedule an appointment as soon as possible for a visit in 4 day(s).   Specialty:  Family Medicine Why:  Hospital Follow Up   Contact information: [place] [address] [phone]          Allergies  Allergen Reactions  . Hydrocodone Nausea And Vomiting  . Oxycodone Nausea And Vomiting    Codeine, hydrocodone, most pain pills.  . Penicillins Itching and Nausea And Vomiting    Has patient had a PCN reaction causing immediate rash, facial/tongue/throat swelling, SOB or lightheadedness with hypotension: Yes, around the eyes Has patient had a PCN reaction causing severe rash involving mucus membranes or skin necrosis: No Has patient had a PCN reaction that required hospitalization: No Has patient had a PCN reaction occurring within the last 10 years: No  If all of the above answers are "NO", then may proceed with Cephalosporin use.   . Precedex [Dexmedetomidine Hcl In Nacl]     Increased agitation/confusion in ICU  [April 2018]  . Seroquel [Quetiapine]     More agitated/confused per daughter Rachel [name] when given in the past.    Procedures/Studies: Ct Abdomen Pelvis Wo Contrast  Result Date: 10/18/2016 CLINICAL DATA:  Severe abdominal pain with vomiting.  Hemoptysis. EXAM: CT CHEST, ABDOMEN AND PELVIS WITHOUT CONTRAST TECHNIQUE: Multidetector CT imaging of the chest, abdomen and pelvis was performed following the standard protocol without IV contrast. COMPARISON:  Recent abdomen radiographs. FINDINGS: CT CHEST FINDINGS Cardiovascular: Atheromatous arterial calcifications, including the coronary arteries and thoracic aorta. Normal sized heart. Mediastinum/Nodes: Endotracheal tube tip 3 cm above the carina. Orogastric tube extending into the stomach. No enlarged lymph nodes. Lungs/Pleura: Diffuse, dense left lower lobe atelectasis mild are right lower lobe and bilateral upper lobe atelectasis. Patchy interstitial prominence throughout both lungs. No pleural fluid. Musculoskeletal: Thoracic spine degenerative changes. CT ABDOMEN  PELVIS FINDINGS Hepatobiliary: Small amount of high density material in the dependent portion of the gallbladder with appearance suggesting small gallstones. No gallbladder wall thickening or pericholecystic fluid. Unremarkable liver. Pancreas: Unremarkable. No pancreatic ductal dilatation or surrounding inflammatory changes. Spleen: Normal in size without focal abnormality. Adrenals/Urinary Tract: Unremarkable adrenal glands. Small amount of bilateral renal artery calcification with no renal calculi a seen. No bladder or ureteral calculi and no hydronephrosis. A Foley catheter is in the urinary bladder with minimal urine in the bladder. Stomach/Bowel: Orogastric tube tip in the duodenal bulb. Unremarkable colon and small bowel. Normal appendix. Vascular/Lymphatic: Mild atheromatous arterial calcifications without aneurysm. No enlarged lymph nodes. Reproductive: Uterus and bilateral adnexa are  unremarkable. Other: Small bilateral inguinal hernias containing fat. Mild bilateral subcutaneous edema. Additional focal areas of edema in the anterior subcutaneous fat bilaterally, compatible with recent injections. Tiny umbilical hernia containing fat. Musculoskeletal: Minimal lumbar spine degenerative changes. IMPRESSION: 1. Dense left lower lobe atelectasis or pneumonia. 2. Mild right lower lobe and bilateral upper lobe atelectasis. 3. Patchy interstitial prominence throughout both lungs, with an appearance most compatible with interstitial pulmonary edema. Interstitial pneumonitis is also a possibility. 4. Probable cholelithiasis without evidence of cholecystitis. 5. Coronary artery atherosclerosis and aortic atherosclerosis. Electronically Signed   By: Claudie Revering M.D.   On: 10/18/2016 14:37   Ct Chest Wo Contrast  Result Date: 10/18/2016 CLINICAL DATA:  Severe abdominal pain with vomiting.  Hemoptysis. EXAM: CT CHEST, ABDOMEN AND PELVIS WITHOUT CONTRAST TECHNIQUE: Multidetector CT imaging of the chest, abdomen and pelvis was performed following the standard protocol without IV contrast. COMPARISON:  Recent abdomen radiographs. FINDINGS: CT CHEST FINDINGS Cardiovascular: Atheromatous arterial calcifications, including the coronary arteries and thoracic aorta. Normal sized heart. Mediastinum/Nodes: Endotracheal tube tip 3 cm above the carina. Orogastric tube extending into the stomach. No enlarged lymph nodes. Lungs/Pleura: Diffuse, dense left lower lobe atelectasis mild are right lower lobe and bilateral upper lobe atelectasis. Patchy interstitial prominence throughout both lungs. No pleural fluid. Musculoskeletal: Thoracic spine degenerative changes. CT ABDOMEN PELVIS FINDINGS Hepatobiliary: Small amount of high density material in the dependent portion of the gallbladder with appearance suggesting small gallstones. No gallbladder wall thickening or pericholecystic fluid. Unremarkable liver. Pancreas:  Unremarkable. No pancreatic ductal dilatation or surrounding inflammatory changes. Spleen: Normal in size without focal abnormality. Adrenals/Urinary Tract: Unremarkable adrenal glands. Small amount of bilateral renal artery calcification with no renal calculi a seen. No bladder or ureteral calculi and no hydronephrosis. A Foley catheter is in the urinary bladder with minimal urine in the bladder. Stomach/Bowel: Orogastric tube tip in the duodenal bulb. Unremarkable colon and small bowel. Normal appendix. Vascular/Lymphatic: Mild atheromatous arterial calcifications without aneurysm. No enlarged lymph nodes. Reproductive: Uterus and bilateral adnexa are unremarkable. Other: Small bilateral inguinal hernias containing fat. Mild bilateral subcutaneous edema. Additional focal areas of edema in the anterior subcutaneous fat bilaterally, compatible with recent injections. Tiny umbilical hernia containing fat. Musculoskeletal: Minimal lumbar spine degenerative changes. IMPRESSION: 1. Dense left lower lobe atelectasis or pneumonia. 2. Mild right lower lobe and bilateral upper lobe atelectasis. 3. Patchy interstitial prominence throughout both lungs, with an appearance most compatible with interstitial pulmonary edema. Interstitial pneumonitis is also a possibility. 4. Probable cholelithiasis without evidence of cholecystitis. 5. Coronary artery atherosclerosis and aortic atherosclerosis. Electronically Signed   By: Claudie Revering M.D.   On: 10/18/2016 14:37   Dg Chest Port 1 View  Result Date: 10/23/2016 CLINICAL DATA:  Respiratory failure. EXAM: PORTABLE CHEST 1 VIEW COMPARISON:  10/22/2016 .  FINDINGS: Interim extubation and removal of NG tube. Left IJ line stable position. Heart size stable. Low lung volumes with basilar atelectasis and infiltrates/edema again noted. Slight improvement. Small left pleural effusion. No pneumothorax . IMPRESSION: 1. Interim extubation removal of NG tube. Left IJ line stable position. 2.  Low lung volumes with basilar atelectasis and infiltrates/edema again noted. Slight improvement prior exam. Small left pleural effusion. Electronically Signed   By: Marcello Moores  Register   On: 10/23/2016 07:14   Dg Chest Port 1 View  Result Date: 10/22/2016 CLINICAL DATA:  Acute respiratory failure.  Short of breath. EXAM: PORTABLE CHEST 1 VIEW COMPARISON:  Yesterday FINDINGS: Endotracheal tube, NG tube, left jugular central venous catheter are stable. Low lung volumes. Pulmonary edema is stable. Upper normal heart size. No pneumothorax. IMPRESSION: Stable edema and support apparatus. Electronically Signed   By: Marybelle Killings M.D.   On: 10/22/2016 07:35   Dg Chest Port 1 View  Result Date: 10/21/2016 CLINICAL DATA:  Respiratory failure. EXAM: PORTABLE CHEST 1 VIEW COMPARISON:  10/20/2016. FINDINGS: Endotracheal tube, NG tube, left IJ line stable position. Heart size stable. Basilar atelectasis . Interim increase in bilateral pulmonary infiltrates/edema. Small left pleural effusion. No pneumothorax. IMPRESSION: 1. Lines and tubes in stable position. 2. Interim increase in bilateral pulmonary infiltrates/edema. Small left pleural effusion. 3. Bibasilar atelectasis. Electronically Signed   By: Marcello Moores  Register   On: 10/21/2016 06:47   Dg Chest Port 1 View  Result Date: 10/20/2016 CLINICAL DATA:  Ventilator support.  Assess endotracheal position. EXAM: PORTABLE CHEST 1 VIEW COMPARISON:  10/19/2016 FINDINGS: Endotracheal tube tip is 1.5 cm above the carina. Nasogastric tube enters the stomach. Left internal jugular central line tip is in the SVC above the right atrium. Mild persistent edema. Persistent left lower lobe collapse/ infiltrate. Small amount of pleural fluid on the left. IMPRESSION: Endotracheal tube well positioned. Persistent mild edema pattern. Persistent left lower lobe collapse/infiltrate. Electronically Signed   By: Nelson Chimes M.D.   On: 10/20/2016 09:53   Dg Chest Port 1 View  Result Date:  10/19/2016 CLINICAL DATA:  Respiratory failure . EXAM: PORTABLE CHEST 1 VIEW COMPARISON:  10/17/2016 . FINDINGS: Endotracheal tube, NG tube, left IJ line stable position. Left base infiltrate noted with small left pleural effusion. No acute bony abnormality . IMPRESSION: 1. Lines and tubes in stable position. 2. Left base infiltrate with small left pleural effusion noted. Electronically Signed   By: Marcello Moores  Register   On: 10/19/2016 07:34   Dg Chest Port 1 View  Result Date: 10/17/2016 CLINICAL DATA:  Re-intubation. EXAM: PORTABLE CHEST 1 VIEW COMPARISON:  Chest radiograph 10/17/2016 FINDINGS: Left IJ central venous catheter tip projects over the superior vena cava. ET tube terminates in the mid trachea. Left lateral hemithorax excluded from view. Stable cardiac and mediastinal contours. Low lung volumes. No large area pulmonary consolidation. Possible small left pleural effusion. IMPRESSION: ET tube terminates in the mid trachea. Low lung volumes, basilar atelectasis and possible small left effusion. Electronically Signed   By: Lovey Newcomer M.D.   On: 10/17/2016 15:02   Dg Chest Port 1 View  Result Date: 10/17/2016 CLINICAL DATA:  56 year old female with history of shortness of breath. EXAM: PORTABLE CHEST 1 VIEW COMPARISON:  Chest x-ray 10/15/2016. FINDINGS: Patient has been extubated. Previously noted left IJ central venous catheter remains in position with tip in the superior cavoatrial junction. Lung volumes are low. Bibasilar opacities favored to reflect areas of subsegmental atelectasis. Probable small left pleural effusion. Crowding  of the pulmonary vasculature, accentuated by low lung volumes, without frank pulmonary edema. Heart size is borderline enlarged, also eccentric to by low lung volumes. Upper mediastinal contours are within normal limits. IMPRESSION: 1. Support apparatus, as above. 2. Low lung volumes with probable bibasilar subsegmental atelectasis and small left pleural effusion.  Electronically Signed   By: Vinnie Langton M.D.   On: 10/17/2016 13:29   Dg Chest Port 1 View  Result Date: 10/15/2016 CLINICAL DATA:  Shortness or breath, ARDS EXAM: PORTABLE CHEST 1 VIEW COMPARISON:  10/14/2016 FINDINGS: Cardiomediastinal silhouette is stable. Endotracheal tube is unchanged in position with tip 2.4 cm above the carina. There is NG tube with tip in distal esophagus about 4 cm from GE junction. The NG tube should be advanced into the stomach. Persistent bilateral patchy infiltrates and mild reticular interstitial prominence/bronchitic changes. IMPRESSION: Endotracheal tube is unchanged in position with tip 2.4 cm above the carina. There is NG tube with tip in distal esophagus about 4 cm from GE junction. The NG tube should be advanced into the stomach. Persistent bilateral patchy infiltrates and mild reticular interstitial prominence/bronchitic changes. Electronically Signed   By: Lahoma Crocker M.D.   On: 10/15/2016 09:26   Dg Chest Port 1 View  Result Date: 10/14/2016 CLINICAL DATA:  Respiratory distress EXAM: PORTABLE CHEST 1 VIEW COMPARISON:  10/13/2016 FINDINGS: Endotracheal tube hand nasogastric catheter are again identified. The proximal side port of the nasogastric catheter again lies in the distal esophagus. Left jugular central line is noted in the proximal superior vena cava. No pneumothorax is noted. Patchy infiltrates are again identified throughout both lung somewhat accentuated by poor inspiratory effort. No bony abnormality is noted. IMPRESSION: Patchy bilateral infiltrates which are somewhat accentuated by poor inspiratory effort. Tubes and lines as described. Electronically Signed   By: Inez Catalina M.D.   On: 10/14/2016 07:25   Dg Chest Port 1 View  Result Date: 10/13/2016 CLINICAL DATA:  Ventilator dependent respiratory failure. Follow-up pneumonia. EXAM: PORTABLE CHEST 1 VIEW COMPARISON:  Portable chest x-rays yesterday and earlier. Two-view chest x-ray 10/08/2016 and  earlier. FINDINGS: Endotracheal tube tip in satisfactory position projecting approximately 4 cm above the carina. Nasogastric tube now courses below the diaphragm into the stomach, though its tip is not included on the image. Left subclavian central venous catheter tip projects over the mid SVC, unchanged. Marked interval improvement in aeration of the right lung since yesterday, with residual airspace consolidation in the right upper lobe. Stable patchy airspace opacities at the left lung base. Improved patchy airspace opacities in the left upper lobe. No new pulmonary parenchymal abnormalities. IMPRESSION: 1.  Support apparatus satisfactory. 2. Improving bilateral pneumonia, with residual patchy airspace opacities in left upper lobe, left base and right upper lobe. 3. No new abnormalities. Electronically Signed   By: Evangeline Dakin M.D.   On: 10/13/2016 09:45   Dg Chest Port 1 View  Result Date: 10/12/2016 CLINICAL DATA:  Acute on chronic respiratory failure with hypoxemia. EXAM: PORTABLE CHEST 1 VIEW COMPARISON:  10/11/2016 FINDINGS: Endotracheal tube is present with tip measuring 4.6 cm above the carina. An enteric tube is present. The tip is in the mid esophagus with proximal side hole at the thoracic inlet. The should be advanced for placement into the stomach. Left central venous catheter with tip over the lower SVC region. No pneumothorax. Normal heart size. Shallow inspiration. Right perihilar infiltration or consolidation. Atelectasis or infiltration in the left lung base. No blunting of costophrenic angles. No pneumothorax.  IMPRESSION: The enteric tube is pulled back with tip now in the mid esophagus. This should be advanced for placement into the stomach. Right perihilar and left basilar infiltration or consolidation. Electronically Signed   By: Lucienne Capers M.D.   On: 10/12/2016 01:38   Dg Chest Port 1 View  Result Date: 10/11/2016 CLINICAL DATA:  Central line placement. EXAM: PORTABLE  CHEST 1 VIEW COMPARISON:  Earlier today at 0525 hours. FINDINGS: 1615 hours. Endotracheal tube terminates 4.4 cm above carina.Left internal jugular line tip at low SVC. Nasogastric tube terminates at the proximal stomach with side port above the gastroesophageal junction. Midline trachea. Normal heart size. Right hemidiaphragm elevation. No pleural effusion or pneumothorax. Low lung volumes. Slight improvement in bilateral interstitial and airspace disease, most confluent in the right upper lobe. IMPRESSION: Left internal jugular line tip at low SVC, without pneumothorax. Improvement in probable pulmonary edema. Multifocal infection could look similar. Nasogastric tube with side port above the gastroesophageal junction. Consider advancement. Electronically Signed   By: Abigail Miyamoto M.D.   On: 10/11/2016 16:56   Portable Chest Xray  Result Date: 10/11/2016 CLINICAL DATA:  Acute chronic respiratory failure with hypoxemia. EXAM: PORTABLE CHEST 1 VIEW COMPARISON:  One-view chest x-ray 10/10/2016 FINDINGS: The heart size is normal. Endotracheal tube is in satisfactory position. NG tube courses off the inferior border of film. Diffuse interstitial and airspace disease is slightly improved with minimal increased aeration. IMPRESSION: 1. Slightly improved diffuse interstitial and airspace pattern suggesting edema. Infection is not excluded. 2. Support apparatus is stable. Electronically Signed   By: San Morelle M.D.   On: 10/11/2016 07:52   Dg Chest Port 1 View  Result Date: 10/10/2016 CLINICAL DATA:  56 year old female respiratory failure.  Influenza. EXAM: PORTABLE CHEST 1 VIEW COMPARISON:  [facility] 1044 hours today and earlier. FINDINGS: Portable AP semi upright view at 1416 hours. Endotracheal tube tip in good position between the level the clavicles and carina. Enteric tube courses to the abdomen, side holes at the level of the gastric body. More lordotic positioning. Mildly lower  lung volumes. Widespread coarse and confluent bilateral pulmonary opacity. Mediastinal contours are stable. No superimposed pneumothorax or pleural effusion identified. IMPRESSION: 1. Endotracheal tube tip now in good position. Enteric tube placed and side hole up the level of the gastric body. 2. Diffuse pulmonary interstitial and confluent opacity compatible with viral/atypical respiratory infection. No superimposed pneumothorax or pleural effusion identified. Electronically Signed   By: Genevie Ann M.D.   On: 10/10/2016 14:53   Dg Abd Portable 1v  Result Date: 10/17/2016 CLINICAL DATA:  OG tube placement. EXAM: PORTABLE ABDOMEN - 1 VIEW COMPARISON:  10/15/2016 FINDINGS: Orogastric tube passes well below the diaphragm with its tip projecting in the expected location of the distal stomach. Tip appears further inserted than on the prior exam. IMPRESSION: OG tube tip projects in the distal stomach. Electronically Signed   By: Lajean Manes M.D.   On: 10/17/2016 15:55   Dg Abd Portable 1v  Result Date: 10/15/2016 CLINICAL DATA:  Feeding tube placement. EXAM: PORTABLE ABDOMEN - 1 VIEW COMPARISON:  10/12/2016. FINDINGS: Breathing motion blurring. Grossly normal bowel gas pattern. Nasogastric tube side hole in the mid stomach and tip in the distal stomach. Lower thoracic spine degenerative changes. IMPRESSION: Nasogastric tube tip and side hole in the stomach, as described above. Electronically Signed   By: Claudie Revering M.D.   On: 10/15/2016 18:59   Dg Abd Portable 1v  Result Date: 10/12/2016 CLINICAL  DATA:  NG tube placement EXAM: PORTABLE ABDOMEN - 1 VIEW COMPARISON:  None. FINDINGS: And NG tube is in place with its tip in the fundus of the stomach. Gas-filled loops of stomach, small bowel, and large bowel are noted. There is no obvious free intraperitoneal gas. There is no disproportionate dilatation of small bowel. IMPRESSION: NG tube tip is in the fundus of the stomach. Electronically Signed   By: Marybelle Killings M.D.   On: 10/12/2016 07:41    (Echo, Carotid, EGD, Colonoscopy, ERCP)   Subjective: Pt says she is feeling and breathing much better, wants to go home, she still refuses SNF placement but will go home with her daughter who will be caring for her.    Discharge Exam: Vitals:   10/25/16 0448 10/25/16 1133  BP: (!) 169/72   Pulse: 90 94  Resp: 18   Temp: 98.4 F (36.9 C)    Vitals:   10/25/16 0448 10/25/16 0547 10/25/16 0738 10/25/16 1133  BP: (!) 169/72     Pulse: 90   94  Resp: 18     Temp: 98.4 F (36.9 C)     TempSrc: Oral     SpO2: 95%  100% 100%  Weight:  86.2 kg (190 lb)    Height:       Constitutional: She is oriented to person, place, and time. No distress.  HENT: Head: Normocephalicand atraumatic.  Eyes: Conjunctivaeare normal. No scleral icterus.  Cardiovascular: Normal rateand regular rhythm.  Pulmonary/Chest: Effort normal. No respiratory distress. Good air movement.  Abdominal: Soft. She exhibits no distension. There is no tenderness.  Neurological: She is alertand oriented to person, place, and time.  Skin: She is not diaphoretic. No rash or lesion seen.   The results of significant diagnostics from this hospitalization (including imaging, microbiology, ancillary and laboratory) are listed below for reference.     Microbiology: Recent Results (from the past 240 hour(s))  Culture, respiratory (NON-Expectorated)     Status: None   Collection Time: 10/19/16  3:02 PM  Result Value Ref Range Status   Specimen Description TRACHEAL ASPIRATE  Final   Special Requests Normal  Final   Gram Stain   Final    MODERATE WBC PRESENT, PREDOMINANTLY PMN NO ORGANISMS SEEN    Culture Consistent with normal respiratory flora.  Final   Report Status 10/21/2016 FINAL  Final     Labs: BNP (last 3 results)  Recent Labs  10/10/16 1507  BNP 63.1   Basic Metabolic Panel:  Recent Labs Lab 10/20/16 0305 10/21/16 0318 10/22/16 0420 10/23/16 0500  10/24/16 0446  NA 139 137 143 144 141  K 3.4* 3.9 3.6 3.2* 3.6  CL 104 106 107 106 109  CO2 26 24 23 26 22   GLUCOSE 117* 151* 88 90 100*  BUN 28* 23* 24* 31* 28*  CREATININE 1.03* 0.89 0.99 1.12* 0.96  CALCIUM 8.5* 8.6* 9.4 9.4 9.3  MG 1.9 1.8 1.8 2.0 1.9  PHOS  --   --  3.5 3.7  --    Liver Function Tests: No results for input(s): AST, ALT, ALKPHOS, BILITOT, PROT, ALBUMIN in the last 168 hours. No results for input(s): LIPASE, AMYLASE in the last 168 hours. No results for input(s): AMMONIA in the last 168 hours. CBC:  Recent Labs Lab 10/20/16 0305 10/21/16 0318 10/22/16 0420 10/23/16 0500 10/24/16 0446  WBC 17.4* 16.7* 16.6* 15.6* 16.7*  HGB 8.3* 8.5* 8.4* 9.0* 9.5*  HCT 25.7* 26.1* 26.1* 27.3* 29.7*  MCV  86.5 86.4 86.1 86.1 87.4  PLT 494* 466* 534* 589* 599*   Cardiac Enzymes: No results for input(s): CKTOTAL, CKMB, CKMBINDEX, TROPONINI in the last 168 hours. BNP: Invalid input(s): POCBNP CBG:  Recent Labs Lab 10/22/16 1942 10/22/16 2345 10/23/16 0329 10/23/16 0736 10/23/16 1138  GLUCAP 106* 99 83 93 111*   D-Dimer No results for input(s): DDIMER in the last 72 hours. Hgb A1c No results for input(s): HGBA1C in the last 72 hours. Lipid Profile No results for input(s): CHOL, HDL, LDLCALC, TRIG, CHOLHDL, LDLDIRECT in the last 72 hours. Thyroid function studies No results for input(s): TSH, T4TOTAL, T3FREE, THYROIDAB in the last 72 hours.  Invalid input(s): FREET3 Anemia work up No results for input(s): VITAMINB12, FOLATE, FERRITIN, TIBC, IRON, RETICCTPCT in the last 72 hours. Urinalysis    Component Value Date/Time   COLORURINE YELLOW 10/12/2016 0109   APPEARANCEUR TURBID (A) 10/12/2016 0109   LABSPEC 1.017 10/12/2016 0109   PHURINE 5.0 10/12/2016 0109   GLUCOSEU 50 (A) 10/12/2016 0109   HGBUR LARGE (A) 10/12/2016 0109   BILIRUBINUR NEGATIVE 10/12/2016 0109   KETONESUR NEGATIVE 10/12/2016 0109   PROTEINUR 100 (A) 10/12/2016 0109   NITRITE NEGATIVE  10/12/2016 0109   LEUKOCYTESUR TRACE (A) 10/12/2016 0109   Sepsis Labs Invalid input(s): PROCALCITONIN,  WBC,  LACTICIDVEN Microbiology Recent Results (from the past 240 hour(s))  Culture, respiratory (NON-Expectorated)     Status: None   Collection Time: 10/19/16  3:02 PM  Result Value Ref Range Status   Specimen Description TRACHEAL ASPIRATE  Final   Special Requests Normal  Final   Gram Stain   Final    MODERATE WBC PRESENT, PREDOMINANTLY PMN NO ORGANISMS SEEN    Culture Consistent with normal respiratory flora.  Final   Report Status 10/21/2016 FINAL  Final   Time coordinating discharge: 45 minutes  SIGNED:  Irwin Brakeman, MD  [facility] 10/25/2016, 11:50 AM Pager [phone]  If 7PM-7AM, please contact night-coverage [email or web address] Password [email or web address]

## 2016-10-25 NOTE — Care Management Note (Addendum)
Case Management Note  Patient Details  Name: Rachel [name] MRN: [id number] Date of Birth: [birth year]  Subjective/Objective:     Admitted with Influenza B pneumonia/ARDS            Action/Plan:  Pt is intubated without family at bedside.   Expected Discharge Date:  10/25/16               Expected Discharge Plan:  Home/Self Care  In-House Referral:     Discharge planning Services  CM Consult  Post Acute Care Choice:    Choice offered to:  Patient  DME Arranged:  3-N-1, Nebulizer machine, Oxygen, Wheelchair manual DME Agency:  [place]:  RN, PT, OT, Nurse's Aide, Refused SNF, Respirator Therapy, Social Work [employer] Agency:  [place]  Status of Service:  In process, will continue to follow  If discussed at Long Length of Stay Meetings, dates discussed:    Additional Comments; Pt will discharge home with oxgyen and neb equipment- [facility] chosen and agency has accepted referral.  Agency informed that pt will discharge today  Pt continues to refuse SNF - Pts daughter confirmed that pt will discharge home with her at [place] [id number]. - however 24 hour supervision can not be guaranteed.  Daughter will continue to attempt to arrange supervision - both pt and daughter aware of SNF recommendation.  CM provided choice for HH/DME - pt and daughter offered choice and chose [facility] - agency contacted and referral accepted.  CM requested wheelchair order .for pt for mobility outside of home.   Maryclare Labrador, RN 10/25/2016, 11:58 AM

## 2016-10-26 DIAGNOSIS — J80 Acute respiratory distress syndrome: Secondary | ICD-10-CM | POA: Diagnosis not present

## 2016-10-26 DIAGNOSIS — J9621 Acute and chronic respiratory failure with hypoxia: Secondary | ICD-10-CM | POA: Diagnosis not present

## 2016-10-26 NOTE — Progress Notes (Signed)
Sepsis present on admission and resolved.

## 2016-10-27 DIAGNOSIS — K219 Gastro-esophageal reflux disease without esophagitis: Secondary | ICD-10-CM | POA: Diagnosis not present

## 2016-10-27 DIAGNOSIS — I25118 Atherosclerotic heart disease of native coronary artery with other forms of angina pectoris: Secondary | ICD-10-CM | POA: Diagnosis not present

## 2016-10-27 DIAGNOSIS — Z7951 Long term (current) use of inhaled steroids: Secondary | ICD-10-CM | POA: Diagnosis not present

## 2016-10-27 DIAGNOSIS — J449 Chronic obstructive pulmonary disease, unspecified: Secondary | ICD-10-CM | POA: Diagnosis not present

## 2016-10-27 DIAGNOSIS — I5030 Unspecified diastolic (congestive) heart failure: Secondary | ICD-10-CM | POA: Diagnosis not present

## 2016-10-27 DIAGNOSIS — F17210 Nicotine dependence, cigarettes, uncomplicated: Secondary | ICD-10-CM | POA: Diagnosis not present

## 2016-10-27 DIAGNOSIS — Z9981 Dependence on supplemental oxygen: Secondary | ICD-10-CM | POA: Diagnosis not present

## 2016-10-27 DIAGNOSIS — I10 Essential (primary) hypertension: Secondary | ICD-10-CM | POA: Diagnosis not present

## 2016-10-27 DIAGNOSIS — I110 Hypertensive heart disease with heart failure: Secondary | ICD-10-CM | POA: Diagnosis not present

## 2016-10-27 DIAGNOSIS — Z7982 Long term (current) use of aspirin: Secondary | ICD-10-CM | POA: Diagnosis not present

## 2016-10-27 DIAGNOSIS — D649 Anemia, unspecified: Secondary | ICD-10-CM | POA: Diagnosis not present

## 2016-10-28 DIAGNOSIS — K219 Gastro-esophageal reflux disease without esophagitis: Secondary | ICD-10-CM | POA: Diagnosis not present

## 2016-10-28 DIAGNOSIS — Z7982 Long term (current) use of aspirin: Secondary | ICD-10-CM | POA: Diagnosis not present

## 2016-10-28 DIAGNOSIS — D649 Anemia, unspecified: Secondary | ICD-10-CM | POA: Diagnosis not present

## 2016-10-28 DIAGNOSIS — Z7951 Long term (current) use of inhaled steroids: Secondary | ICD-10-CM | POA: Diagnosis not present

## 2016-10-28 DIAGNOSIS — Z9981 Dependence on supplemental oxygen: Secondary | ICD-10-CM | POA: Diagnosis not present

## 2016-10-28 DIAGNOSIS — I110 Hypertensive heart disease with heart failure: Secondary | ICD-10-CM | POA: Diagnosis not present

## 2016-10-28 DIAGNOSIS — F17210 Nicotine dependence, cigarettes, uncomplicated: Secondary | ICD-10-CM | POA: Diagnosis not present

## 2016-10-28 DIAGNOSIS — J449 Chronic obstructive pulmonary disease, unspecified: Secondary | ICD-10-CM | POA: Diagnosis not present

## 2016-10-28 DIAGNOSIS — I25118 Atherosclerotic heart disease of native coronary artery with other forms of angina pectoris: Secondary | ICD-10-CM | POA: Diagnosis not present

## 2016-10-28 DIAGNOSIS — I5030 Unspecified diastolic (congestive) heart failure: Secondary | ICD-10-CM | POA: Diagnosis not present

## 2016-10-29 DIAGNOSIS — D649 Anemia, unspecified: Secondary | ICD-10-CM | POA: Diagnosis not present

## 2016-10-29 DIAGNOSIS — I25118 Atherosclerotic heart disease of native coronary artery with other forms of angina pectoris: Secondary | ICD-10-CM | POA: Diagnosis not present

## 2016-10-29 DIAGNOSIS — F17210 Nicotine dependence, cigarettes, uncomplicated: Secondary | ICD-10-CM | POA: Diagnosis not present

## 2016-10-29 DIAGNOSIS — I110 Hypertensive heart disease with heart failure: Secondary | ICD-10-CM | POA: Diagnosis not present

## 2016-10-29 DIAGNOSIS — Z9981 Dependence on supplemental oxygen: Secondary | ICD-10-CM | POA: Diagnosis not present

## 2016-10-29 DIAGNOSIS — K219 Gastro-esophageal reflux disease without esophagitis: Secondary | ICD-10-CM | POA: Diagnosis not present

## 2016-10-29 DIAGNOSIS — I5030 Unspecified diastolic (congestive) heart failure: Secondary | ICD-10-CM | POA: Diagnosis not present

## 2016-10-29 DIAGNOSIS — Z7982 Long term (current) use of aspirin: Secondary | ICD-10-CM | POA: Diagnosis not present

## 2016-10-29 DIAGNOSIS — J449 Chronic obstructive pulmonary disease, unspecified: Secondary | ICD-10-CM | POA: Diagnosis not present

## 2016-10-29 DIAGNOSIS — Z7951 Long term (current) use of inhaled steroids: Secondary | ICD-10-CM | POA: Diagnosis not present

## 2016-11-03 DIAGNOSIS — Z9981 Dependence on supplemental oxygen: Secondary | ICD-10-CM | POA: Diagnosis not present

## 2016-11-03 DIAGNOSIS — I5030 Unspecified diastolic (congestive) heart failure: Secondary | ICD-10-CM | POA: Diagnosis not present

## 2016-11-03 DIAGNOSIS — K219 Gastro-esophageal reflux disease without esophagitis: Secondary | ICD-10-CM | POA: Diagnosis not present

## 2016-11-03 DIAGNOSIS — J449 Chronic obstructive pulmonary disease, unspecified: Secondary | ICD-10-CM | POA: Diagnosis not present

## 2016-11-03 DIAGNOSIS — I110 Hypertensive heart disease with heart failure: Secondary | ICD-10-CM | POA: Diagnosis not present

## 2016-11-03 DIAGNOSIS — Z7951 Long term (current) use of inhaled steroids: Secondary | ICD-10-CM | POA: Diagnosis not present

## 2016-11-03 DIAGNOSIS — Z7982 Long term (current) use of aspirin: Secondary | ICD-10-CM | POA: Diagnosis not present

## 2016-11-03 DIAGNOSIS — F17210 Nicotine dependence, cigarettes, uncomplicated: Secondary | ICD-10-CM | POA: Diagnosis not present

## 2016-11-03 DIAGNOSIS — I25118 Atherosclerotic heart disease of native coronary artery with other forms of angina pectoris: Secondary | ICD-10-CM | POA: Diagnosis not present

## 2016-11-03 DIAGNOSIS — D649 Anemia, unspecified: Secondary | ICD-10-CM | POA: Diagnosis not present

## 2016-11-03 NOTE — Telephone Encounter (Signed)
Spoke with daughter who was very upset that these forms have not been filled up. Informed her again that BQ has not been here to two weeks, she states no one informed her of this but previous message shows nurse informed her of this. Informed her BQ will be in the office this afternoon and can fill form out then. She is requesting a phone call today once this is completed.   Caryl Pina please advise when BQ fills this out

## 2016-11-03 NOTE — Telephone Encounter (Signed)
Pt daughter calling again a/b FMLA paperwork needs them signed and faxed back ASAP today she can be reached @  [phone].Hillery Hunter

## 2016-11-05 DIAGNOSIS — I5030 Unspecified diastolic (congestive) heart failure: Secondary | ICD-10-CM | POA: Diagnosis not present

## 2016-11-05 DIAGNOSIS — F17210 Nicotine dependence, cigarettes, uncomplicated: Secondary | ICD-10-CM | POA: Diagnosis not present

## 2016-11-05 DIAGNOSIS — Z7951 Long term (current) use of inhaled steroids: Secondary | ICD-10-CM | POA: Diagnosis not present

## 2016-11-05 DIAGNOSIS — D649 Anemia, unspecified: Secondary | ICD-10-CM | POA: Diagnosis not present

## 2016-11-05 DIAGNOSIS — I25118 Atherosclerotic heart disease of native coronary artery with other forms of angina pectoris: Secondary | ICD-10-CM | POA: Diagnosis not present

## 2016-11-05 DIAGNOSIS — Z7982 Long term (current) use of aspirin: Secondary | ICD-10-CM | POA: Diagnosis not present

## 2016-11-05 DIAGNOSIS — J449 Chronic obstructive pulmonary disease, unspecified: Secondary | ICD-10-CM | POA: Diagnosis not present

## 2016-11-05 DIAGNOSIS — Z9981 Dependence on supplemental oxygen: Secondary | ICD-10-CM | POA: Diagnosis not present

## 2016-11-05 DIAGNOSIS — I110 Hypertensive heart disease with heart failure: Secondary | ICD-10-CM | POA: Diagnosis not present

## 2016-11-05 DIAGNOSIS — K219 Gastro-esophageal reflux disease without esophagitis: Secondary | ICD-10-CM | POA: Diagnosis not present

## 2016-11-09 ENCOUNTER — Other Ambulatory Visit (HOSPITAL_COMMUNITY): Payer: Self-pay | Admitting: Internal Medicine

## 2016-11-09 DIAGNOSIS — J9600 Acute respiratory failure, unspecified whether with hypoxia or hypercapnia: Secondary | ICD-10-CM | POA: Diagnosis not present

## 2016-11-09 DIAGNOSIS — Z1231 Encounter for screening mammogram for malignant neoplasm of breast: Secondary | ICD-10-CM

## 2016-11-09 DIAGNOSIS — J449 Chronic obstructive pulmonary disease, unspecified: Secondary | ICD-10-CM | POA: Diagnosis not present

## 2016-11-09 DIAGNOSIS — F419 Anxiety disorder, unspecified: Secondary | ICD-10-CM | POA: Diagnosis not present

## 2016-11-09 DIAGNOSIS — Z6838 Body mass index (BMI) 38.0-38.9, adult: Secondary | ICD-10-CM | POA: Diagnosis not present

## 2016-11-09 DIAGNOSIS — J189 Pneumonia, unspecified organism: Secondary | ICD-10-CM | POA: Diagnosis not present

## 2016-11-09 NOTE — Telephone Encounter (Signed)
Rec'd completed forms from [place]. Fwd to [employer] via interoffice mail - pr

## 2016-11-09 NOTE — Telephone Encounter (Signed)
Caryl Pina do you know if BQ has completed these forms?  thanks

## 2016-11-12 DIAGNOSIS — K219 Gastro-esophageal reflux disease without esophagitis: Secondary | ICD-10-CM | POA: Diagnosis not present

## 2016-11-12 DIAGNOSIS — I110 Hypertensive heart disease with heart failure: Secondary | ICD-10-CM | POA: Diagnosis not present

## 2016-11-12 DIAGNOSIS — D649 Anemia, unspecified: Secondary | ICD-10-CM | POA: Diagnosis not present

## 2016-11-12 DIAGNOSIS — I5030 Unspecified diastolic (congestive) heart failure: Secondary | ICD-10-CM | POA: Diagnosis not present

## 2016-11-12 DIAGNOSIS — J449 Chronic obstructive pulmonary disease, unspecified: Secondary | ICD-10-CM | POA: Diagnosis not present

## 2016-11-12 DIAGNOSIS — I25118 Atherosclerotic heart disease of native coronary artery with other forms of angina pectoris: Secondary | ICD-10-CM | POA: Diagnosis not present

## 2016-11-12 DIAGNOSIS — F17210 Nicotine dependence, cigarettes, uncomplicated: Secondary | ICD-10-CM | POA: Diagnosis not present

## 2016-11-12 DIAGNOSIS — Z7982 Long term (current) use of aspirin: Secondary | ICD-10-CM | POA: Diagnosis not present

## 2016-11-12 DIAGNOSIS — Z9981 Dependence on supplemental oxygen: Secondary | ICD-10-CM | POA: Diagnosis not present

## 2016-11-12 DIAGNOSIS — Z7951 Long term (current) use of inhaled steroids: Secondary | ICD-10-CM | POA: Diagnosis not present

## 2016-11-24 DIAGNOSIS — J9621 Acute and chronic respiratory failure with hypoxia: Secondary | ICD-10-CM | POA: Diagnosis not present

## 2016-11-25 DIAGNOSIS — J9621 Acute and chronic respiratory failure with hypoxia: Secondary | ICD-10-CM | POA: Diagnosis not present

## 2016-12-25 DIAGNOSIS — J9621 Acute and chronic respiratory failure with hypoxia: Secondary | ICD-10-CM | POA: Diagnosis not present

## 2016-12-26 DIAGNOSIS — J9621 Acute and chronic respiratory failure with hypoxia: Secondary | ICD-10-CM | POA: Diagnosis not present

## 2017-01-24 DIAGNOSIS — J9621 Acute and chronic respiratory failure with hypoxia: Secondary | ICD-10-CM | POA: Diagnosis not present

## 2017-01-25 DIAGNOSIS — J9621 Acute and chronic respiratory failure with hypoxia: Secondary | ICD-10-CM | POA: Diagnosis not present

## 2017-02-16 DIAGNOSIS — Z1389 Encounter for screening for other disorder: Secondary | ICD-10-CM | POA: Diagnosis not present

## 2017-02-16 DIAGNOSIS — F330 Major depressive disorder, recurrent, mild: Secondary | ICD-10-CM | POA: Diagnosis not present

## 2017-02-16 DIAGNOSIS — Z6841 Body Mass Index (BMI) 40.0 and over, adult: Secondary | ICD-10-CM | POA: Diagnosis not present

## 2017-02-16 DIAGNOSIS — F329 Major depressive disorder, single episode, unspecified: Secondary | ICD-10-CM | POA: Diagnosis not present

## 2017-02-16 DIAGNOSIS — E669 Obesity, unspecified: Secondary | ICD-10-CM | POA: Diagnosis not present

## 2017-02-24 DIAGNOSIS — J9621 Acute and chronic respiratory failure with hypoxia: Secondary | ICD-10-CM | POA: Diagnosis not present

## 2017-02-25 DIAGNOSIS — J9621 Acute and chronic respiratory failure with hypoxia: Secondary | ICD-10-CM | POA: Diagnosis not present

## 2017-03-15 ENCOUNTER — Encounter: Payer: Self-pay | Admitting: *Deleted

## 2017-03-18 ENCOUNTER — Other Ambulatory Visit: Payer: Self-pay | Admitting: Adult Health

## 2017-03-27 DIAGNOSIS — J9621 Acute and chronic respiratory failure with hypoxia: Secondary | ICD-10-CM | POA: Diagnosis not present

## 2017-03-28 DIAGNOSIS — J9621 Acute and chronic respiratory failure with hypoxia: Secondary | ICD-10-CM | POA: Diagnosis not present

## 2017-04-22 DIAGNOSIS — J302 Other seasonal allergic rhinitis: Secondary | ICD-10-CM | POA: Diagnosis not present

## 2017-04-22 DIAGNOSIS — R5383 Other fatigue: Secondary | ICD-10-CM | POA: Diagnosis not present

## 2017-04-22 DIAGNOSIS — F419 Anxiety disorder, unspecified: Secondary | ICD-10-CM | POA: Diagnosis not present

## 2017-04-22 DIAGNOSIS — Z6841 Body Mass Index (BMI) 40.0 and over, adult: Secondary | ICD-10-CM | POA: Diagnosis not present

## 2017-04-22 DIAGNOSIS — F330 Major depressive disorder, recurrent, mild: Secondary | ICD-10-CM | POA: Diagnosis not present

## 2017-04-22 DIAGNOSIS — Z1389 Encounter for screening for other disorder: Secondary | ICD-10-CM | POA: Diagnosis not present

## 2017-04-22 DIAGNOSIS — D649 Anemia, unspecified: Secondary | ICD-10-CM | POA: Diagnosis not present

## 2017-04-22 DIAGNOSIS — G4730 Sleep apnea, unspecified: Secondary | ICD-10-CM | POA: Diagnosis not present

## 2017-04-22 DIAGNOSIS — R7309 Other abnormal glucose: Secondary | ICD-10-CM | POA: Diagnosis not present

## 2017-04-22 DIAGNOSIS — F329 Major depressive disorder, single episode, unspecified: Secondary | ICD-10-CM | POA: Diagnosis not present

## 2017-04-22 DIAGNOSIS — E538 Deficiency of other specified B group vitamins: Secondary | ICD-10-CM | POA: Diagnosis not present

## 2017-04-22 DIAGNOSIS — Z23 Encounter for immunization: Secondary | ICD-10-CM | POA: Diagnosis not present

## 2017-04-24 IMAGING — CR DG CHEST 1V PORT
1 series · 1 of 1 positions shown · non-contrast
Comparison: 10/13/2016

CLINICAL DATA: Respiratory distress

EXAM:
PORTABLE CHEST 1 VIEW

[AP]
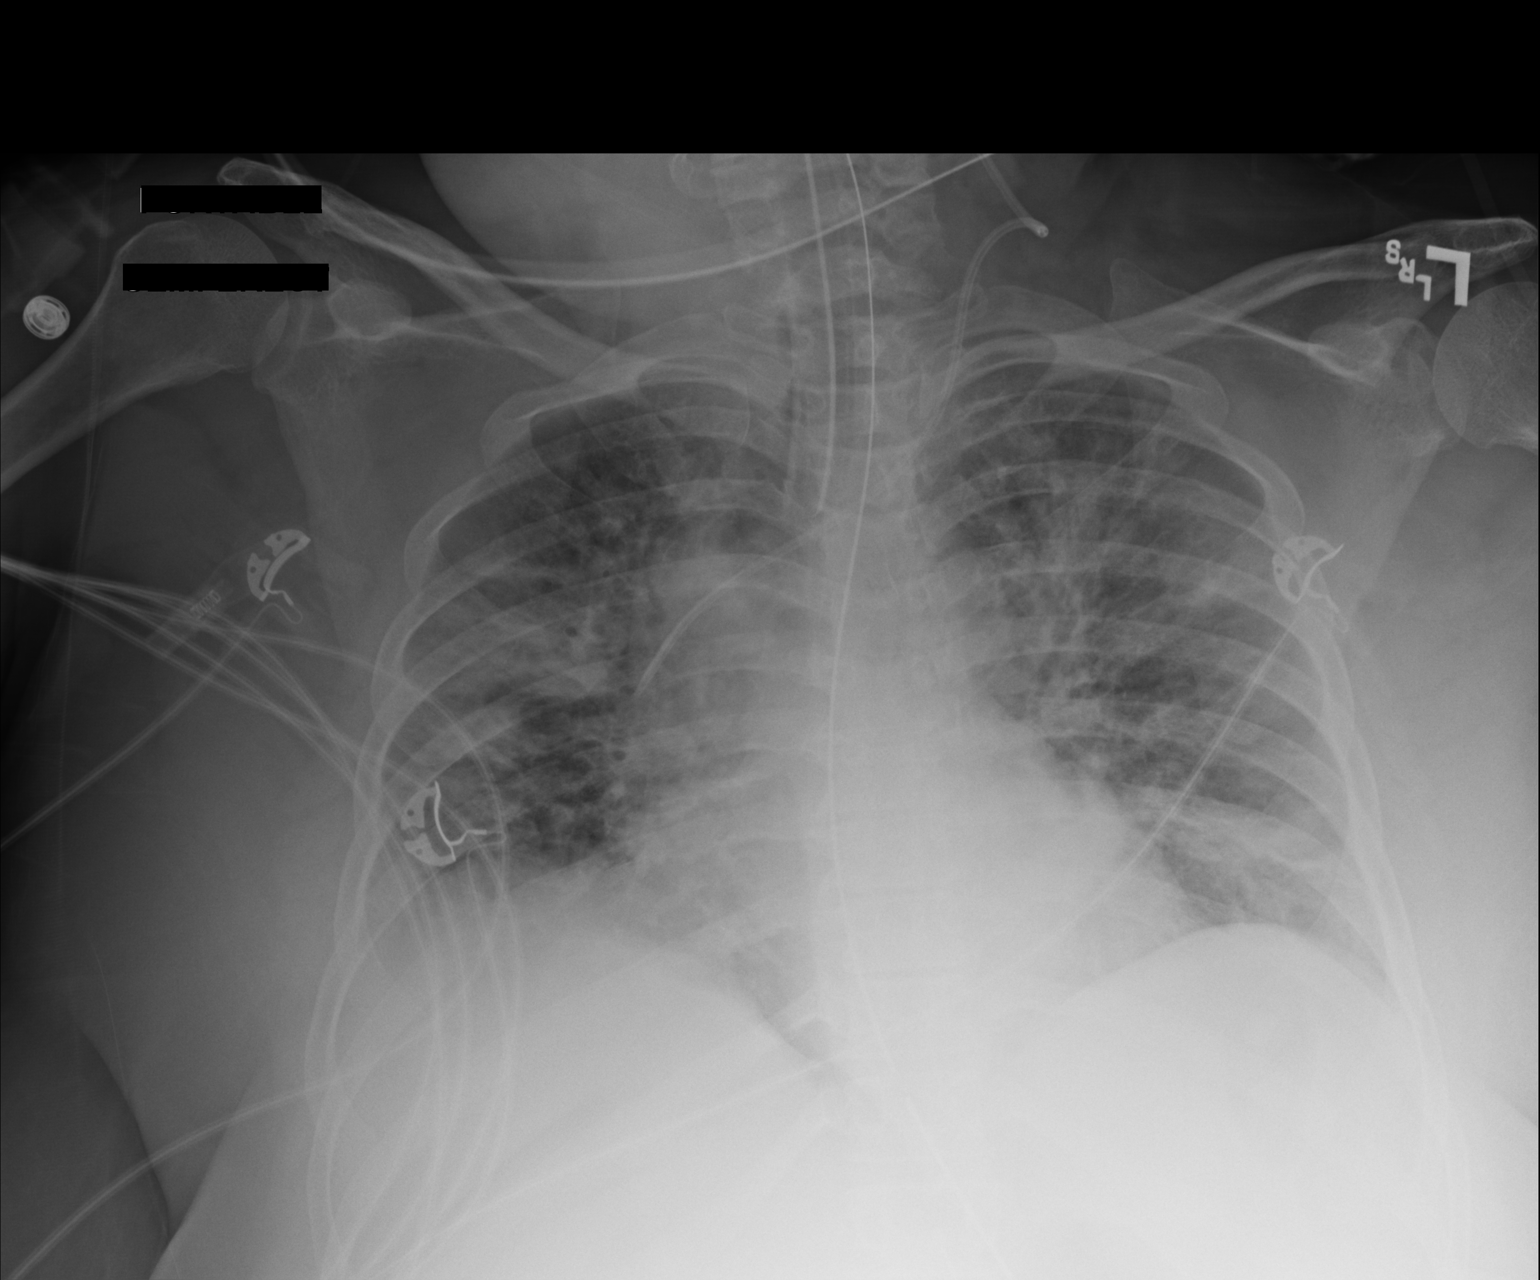

[1 of 1 positions shown; findings below may reference images not displayed]

FINDINGS: Endotracheal tube hand nasogastric catheter are again identified.
The proximal side port of the nasogastric catheter again lies in the
distal esophagus. Left jugular central line is noted in the proximal
superior vena cava. No pneumothorax is noted. Patchy infiltrates are
again identified throughout both lung somewhat accentuated by poor
inspiratory effort. No bony abnormality is noted.
IMPRESSION: Patchy bilateral infiltrates which are somewhat accentuated by poor
inspiratory effort.

Tubes and lines as described.

## 2017-04-25 IMAGING — CR DG ABD PORTABLE 1V
1 series · 1 of 1 positions shown · non-contrast
Comparison: 10/12/2016.

CLINICAL DATA: Feeding tube placement.

EXAM:
PORTABLE ABDOMEN - 1 VIEW

[AP]
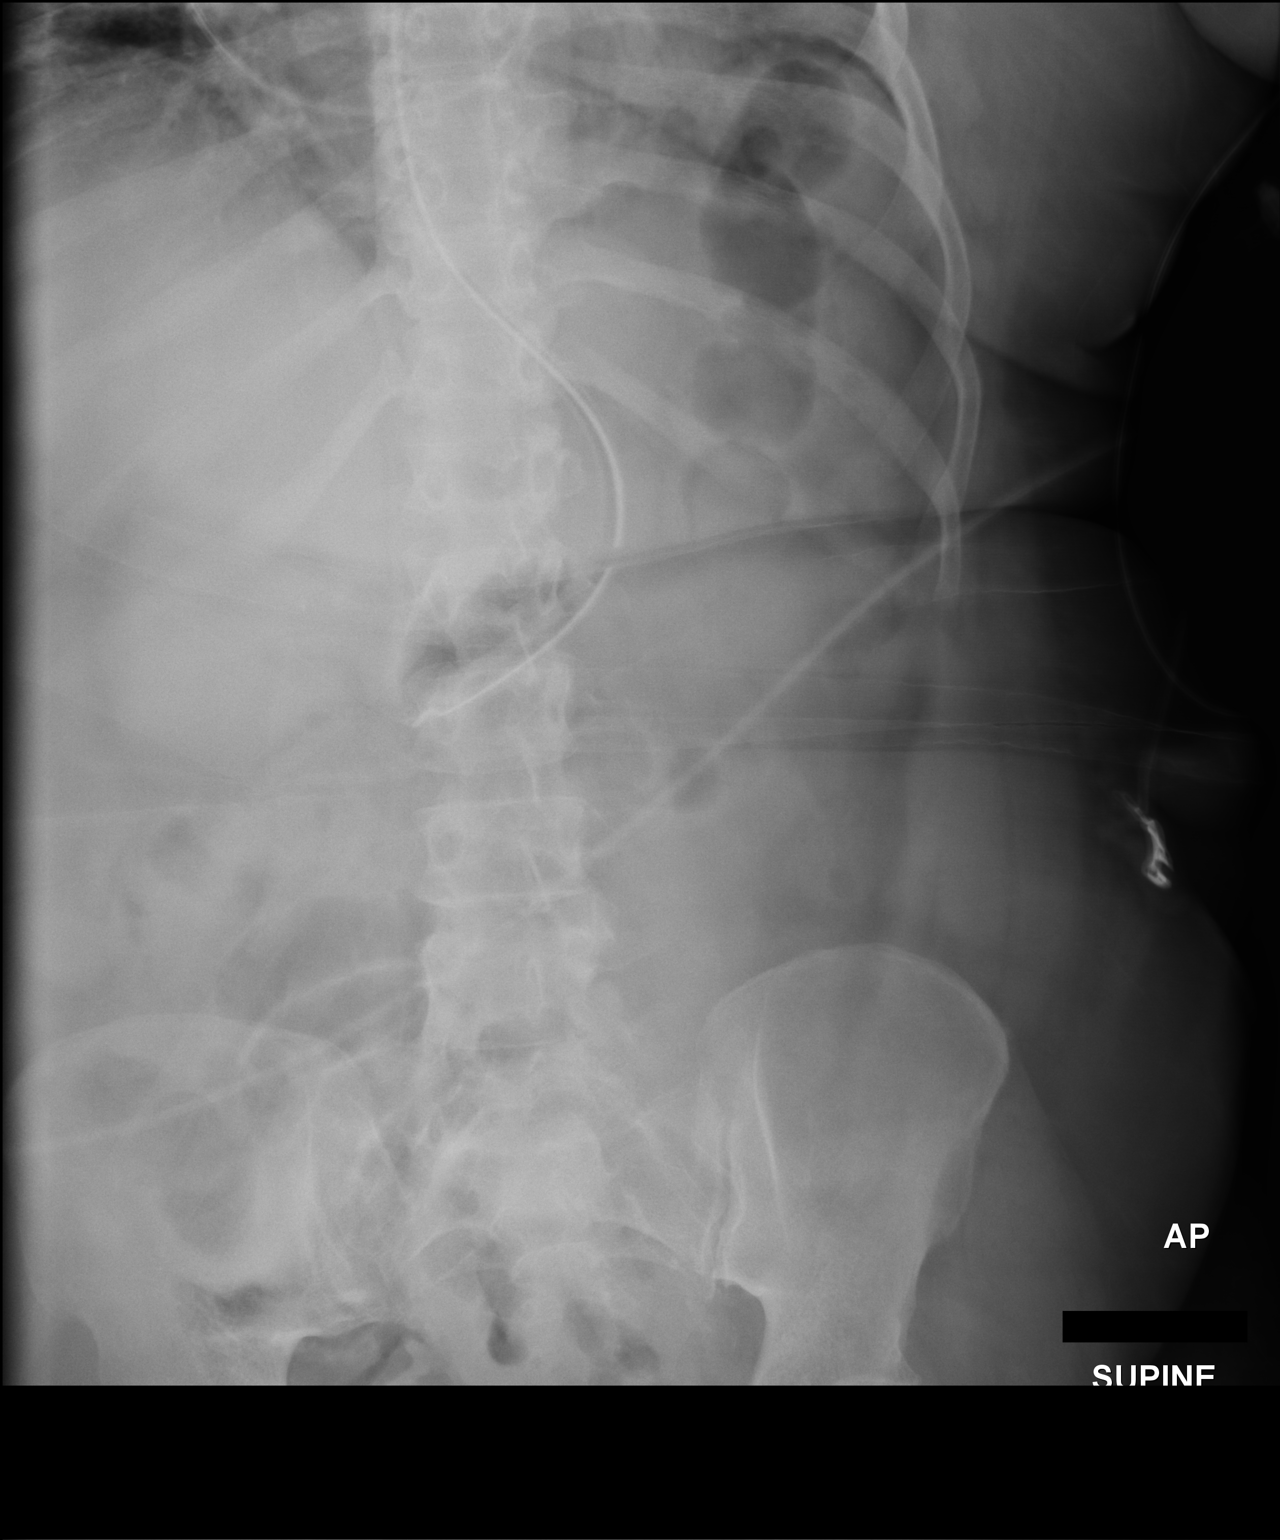

[1 of 1 positions shown; findings below may reference images not displayed]

FINDINGS: Breathing motion blurring. Grossly normal bowel gas pattern.
Nasogastric tube side hole in the mid stomach and tip in the distal
stomach. Lower thoracic spine degenerative changes.
IMPRESSION: Nasogastric tube tip and side hole in the stomach, as described
above.

## 2017-04-25 IMAGING — CR DG CHEST 1V PORT
1 series · 1 of 1 positions shown · non-contrast
Comparison: 10/14/2016

CLINICAL DATA: Shortness or breath, ARDS

EXAM:
PORTABLE CHEST 1 VIEW

[portable]
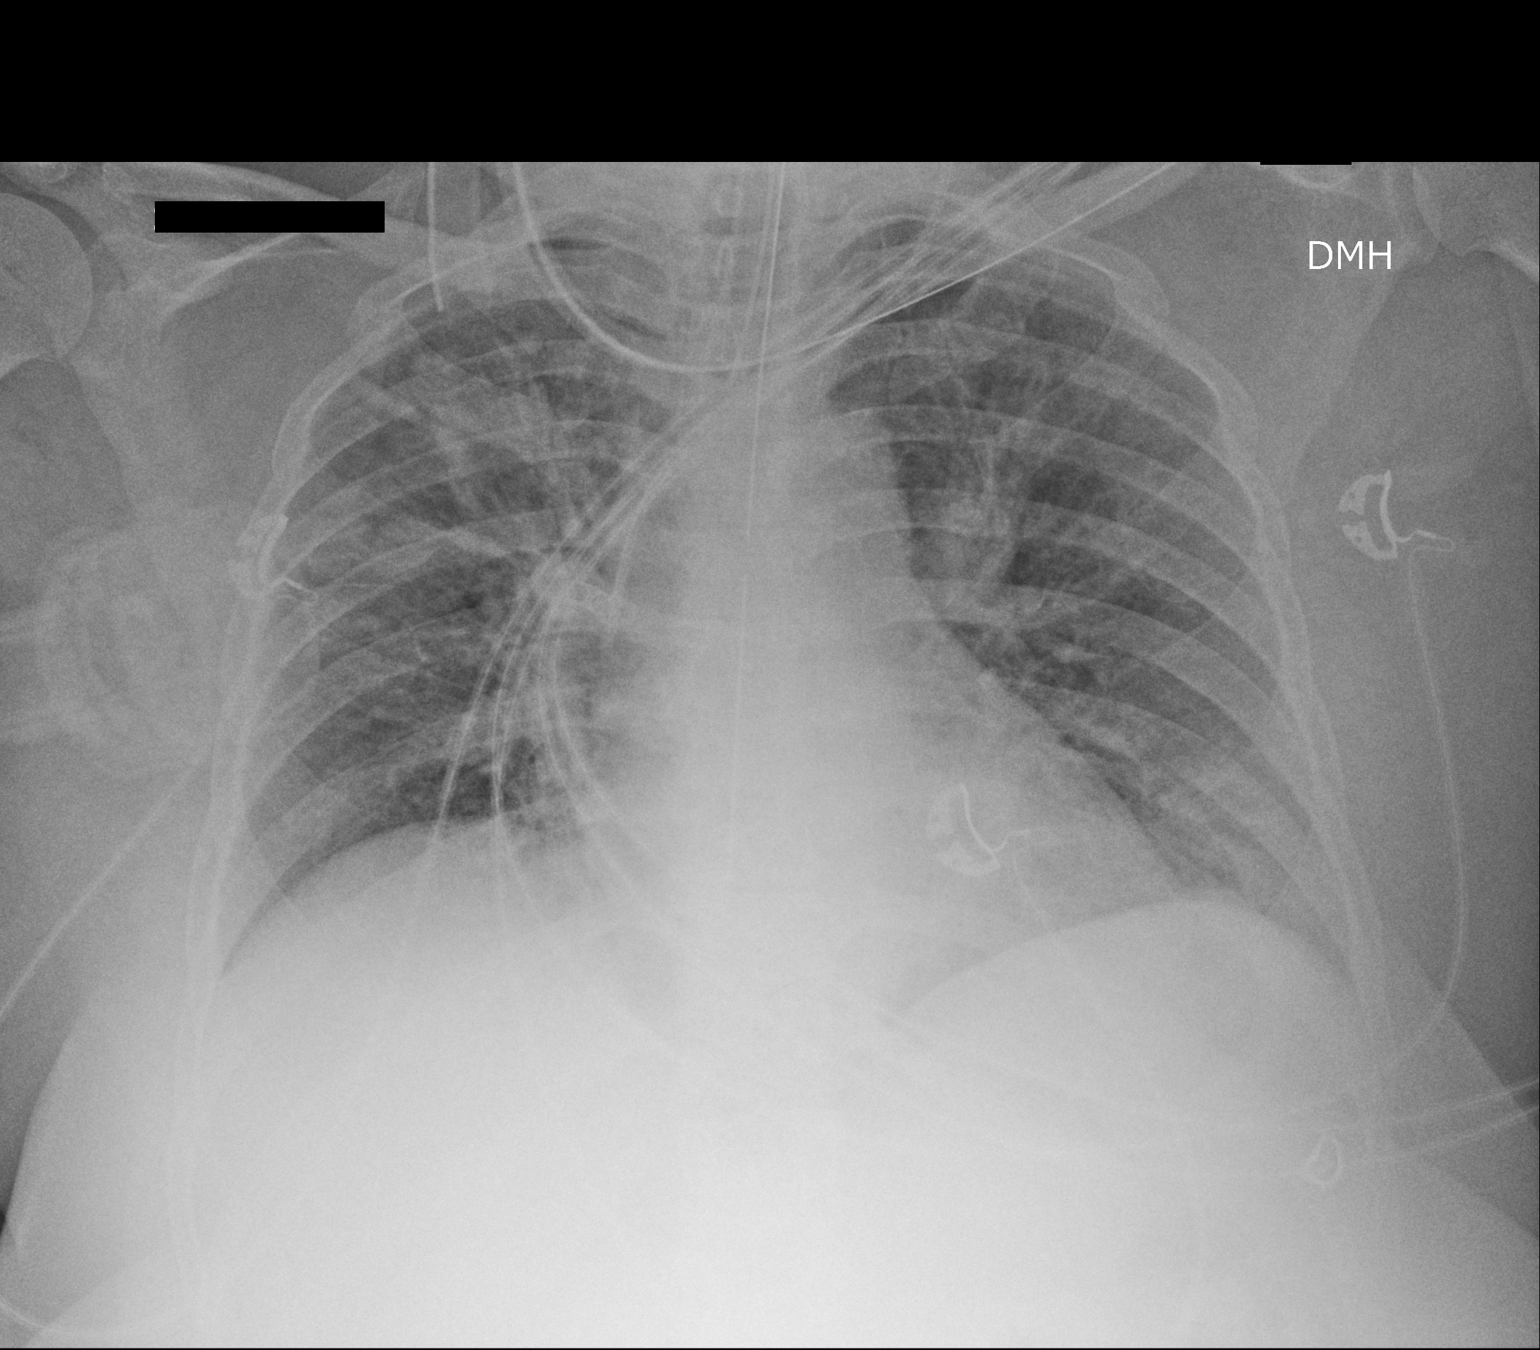

[1 of 1 positions shown; findings below may reference images not displayed]

FINDINGS: Cardiomediastinal silhouette is stable. Endotracheal tube is
unchanged in position with tip 2.4 cm above the carina. There is NG
tube with tip in distal esophagus about 4 cm from GE junction. The
NG tube should be advanced into the stomach. Persistent bilateral
patchy infiltrates and mild reticular interstitial
prominence/bronchitic changes.
IMPRESSION: Endotracheal tube is unchanged in position with tip 2.4 cm above the
carina. There is NG tube with tip in distal esophagus about 4 cm
from GE junction. The NG tube should be advanced into the stomach.
Persistent bilateral patchy infiltrates and mild reticular
interstitial prominence/bronchitic changes.

## 2017-04-26 DIAGNOSIS — J9621 Acute and chronic respiratory failure with hypoxia: Secondary | ICD-10-CM | POA: Diagnosis not present

## 2017-04-27 DIAGNOSIS — J9621 Acute and chronic respiratory failure with hypoxia: Secondary | ICD-10-CM | POA: Diagnosis not present

## 2017-05-04 DIAGNOSIS — G4730 Sleep apnea, unspecified: Secondary | ICD-10-CM | POA: Diagnosis not present

## 2017-05-04 DIAGNOSIS — R5383 Other fatigue: Secondary | ICD-10-CM | POA: Diagnosis not present

## 2017-05-04 DIAGNOSIS — Z719 Counseling, unspecified: Secondary | ICD-10-CM | POA: Diagnosis not present

## 2017-05-25 DIAGNOSIS — E559 Vitamin D deficiency, unspecified: Secondary | ICD-10-CM | POA: Diagnosis not present

## 2017-05-25 DIAGNOSIS — R7309 Other abnormal glucose: Secondary | ICD-10-CM | POA: Diagnosis not present

## 2017-05-25 DIAGNOSIS — R7989 Other specified abnormal findings of blood chemistry: Secondary | ICD-10-CM | POA: Diagnosis not present

## 2017-05-25 DIAGNOSIS — Z1389 Encounter for screening for other disorder: Secondary | ICD-10-CM | POA: Diagnosis not present

## 2017-05-25 DIAGNOSIS — D649 Anemia, unspecified: Secondary | ICD-10-CM | POA: Diagnosis not present

## 2017-05-25 DIAGNOSIS — Z6841 Body Mass Index (BMI) 40.0 and over, adult: Secondary | ICD-10-CM | POA: Diagnosis not present

## 2017-05-27 DIAGNOSIS — J9621 Acute and chronic respiratory failure with hypoxia: Secondary | ICD-10-CM | POA: Diagnosis not present

## 2017-05-28 DIAGNOSIS — J9621 Acute and chronic respiratory failure with hypoxia: Secondary | ICD-10-CM | POA: Diagnosis not present

## 2017-06-04 DIAGNOSIS — G4733 Obstructive sleep apnea (adult) (pediatric): Secondary | ICD-10-CM | POA: Diagnosis not present

## 2017-06-09 DIAGNOSIS — Z87891 Personal history of nicotine dependence: Secondary | ICD-10-CM | POA: Diagnosis not present

## 2017-06-09 DIAGNOSIS — Z79899 Other long term (current) drug therapy: Secondary | ICD-10-CM | POA: Diagnosis not present

## 2017-06-09 DIAGNOSIS — J441 Chronic obstructive pulmonary disease with (acute) exacerbation: Secondary | ICD-10-CM | POA: Diagnosis not present

## 2017-06-09 DIAGNOSIS — R0989 Other specified symptoms and signs involving the circulatory and respiratory systems: Secondary | ICD-10-CM | POA: Diagnosis not present

## 2017-06-09 DIAGNOSIS — F410 Panic disorder [episodic paroxysmal anxiety] without agoraphobia: Secondary | ICD-10-CM | POA: Diagnosis not present

## 2017-06-09 DIAGNOSIS — J181 Lobar pneumonia, unspecified organism: Secondary | ICD-10-CM | POA: Diagnosis not present

## 2017-06-09 DIAGNOSIS — I10 Essential (primary) hypertension: Secondary | ICD-10-CM | POA: Diagnosis not present

## 2017-06-10 ENCOUNTER — Encounter (HOSPITAL_COMMUNITY): Payer: MEDICAID | Attending: Oncology

## 2017-06-26 DIAGNOSIS — J9621 Acute and chronic respiratory failure with hypoxia: Secondary | ICD-10-CM | POA: Diagnosis not present

## 2017-06-27 DIAGNOSIS — J9621 Acute and chronic respiratory failure with hypoxia: Secondary | ICD-10-CM | POA: Diagnosis not present

## 2017-07-02 DIAGNOSIS — Z6841 Body Mass Index (BMI) 40.0 and over, adult: Secondary | ICD-10-CM | POA: Diagnosis not present

## 2017-07-02 DIAGNOSIS — J441 Chronic obstructive pulmonary disease with (acute) exacerbation: Secondary | ICD-10-CM | POA: Diagnosis not present

## 2017-07-02 DIAGNOSIS — Z1389 Encounter for screening for other disorder: Secondary | ICD-10-CM | POA: Diagnosis not present

## 2017-07-04 DIAGNOSIS — R0602 Shortness of breath: Secondary | ICD-10-CM | POA: Diagnosis not present

## 2017-07-04 DIAGNOSIS — Z79899 Other long term (current) drug therapy: Secondary | ICD-10-CM | POA: Diagnosis not present

## 2017-07-04 DIAGNOSIS — J449 Chronic obstructive pulmonary disease, unspecified: Secondary | ICD-10-CM | POA: Diagnosis not present

## 2017-07-04 DIAGNOSIS — Z87891 Personal history of nicotine dependence: Secondary | ICD-10-CM | POA: Diagnosis not present

## 2017-07-04 DIAGNOSIS — R05 Cough: Secondary | ICD-10-CM | POA: Diagnosis not present

## 2017-07-04 DIAGNOSIS — F410 Panic disorder [episodic paroxysmal anxiety] without agoraphobia: Secondary | ICD-10-CM | POA: Diagnosis not present

## 2017-07-04 DIAGNOSIS — I10 Essential (primary) hypertension: Secondary | ICD-10-CM | POA: Diagnosis not present

## 2017-07-13 ENCOUNTER — Ambulatory Visit: Payer: Self-pay | Admitting: "Endocrinology

## 2017-07-27 DIAGNOSIS — J9621 Acute and chronic respiratory failure with hypoxia: Secondary | ICD-10-CM | POA: Diagnosis not present

## 2017-07-28 ENCOUNTER — Institutional Professional Consult (permissible substitution): Payer: Self-pay | Admitting: Pulmonary Disease

## 2017-07-28 DIAGNOSIS — J9621 Acute and chronic respiratory failure with hypoxia: Secondary | ICD-10-CM | POA: Diagnosis not present

## 2017-08-04 ENCOUNTER — Ambulatory Visit (HOSPITAL_COMMUNITY): Payer: Self-pay

## 2017-08-12 ENCOUNTER — Ambulatory Visit: Payer: BLUE CROSS/BLUE SHIELD | Admitting: Internal Medicine

## 2017-08-12 ENCOUNTER — Encounter: Payer: Self-pay | Admitting: Internal Medicine

## 2017-08-12 VITALS — BP 130/76 | HR 103 | Ht 60.0 in | Wt 231.0 lb

## 2017-08-12 DIAGNOSIS — I10 Essential (primary) hypertension: Secondary | ICD-10-CM | POA: Diagnosis not present

## 2017-08-12 DIAGNOSIS — J449 Chronic obstructive pulmonary disease, unspecified: Secondary | ICD-10-CM | POA: Diagnosis not present

## 2017-08-12 MED ORDER — PREDNISONE 10 MG PO TABS: ORAL_TABLET | ORAL | 0 refills | Status: DC

## 2017-08-12 MED ORDER — LOSARTAN POTASSIUM-HCTZ 100-25 MG PO TABS: 1.0000 | ORAL_TABLET | Freq: Every day | ORAL | 3 refills | Status: DC

## 2017-08-12 NOTE — Assessment & Plan Note (Signed)
Body mass index is 45.11 kg/m.  -  trending up  No results found for: TSH   Contributing to gerd risk/ doe/reviewed the need and the process to achieve and maintain neg calorie balance > defer f/u primary care including intermittently monitoring thyroid status

## 2017-08-12 NOTE — Assessment & Plan Note (Addendum)
Quit smoking 09/2016 - Spirometry 08/12/2017  FEV1 1.21 (65%)  Ratio 89 p am symb/spiriva dpi - 08/12/2017  After extensive coaching inhaler device  effectiveness =    75% from a baseline of < 25% continue symb 80 2bid and off spiriva dpi and off ACEi      When respiratory symptoms begin or become refractory well after a patient reports complete smoking cessation,  Especially when this wasn't the case while they were smoking, a red flag is raised based on the work of Dr Kris Mouton which states:  if you quit smoking when your best day FEV1 is still well preserved it is highly unlikely you will progress to severe disease.  That is to say, once the smoking stops,  the symptoms should not suddenly erupt or markedly worsen.  If so, the differential diagnosis should include  obesity/deconditioning,  LPR/Reflux/Aspiration syndromes,  occult CHF, or  especially side effect of medications commonly used in this population especially ACEi  But also DPI inhlalers like spiriva handheld>  First need try off then regroup in 6 weeks and in meantime just continue low dose symbicort and pred x 6 days if not improving though also doubt much asthma here(since didn't have it before stopped smoking)    Total time devoted to counseling  > 50 % of initial 60 min office visit:  review case with pt/ discussion of options/alternatives/ personally creating written customized instructions  in presence of pt  then going over those specific  Instructions directly with the pt including how to use all of the meds but in particular covering each new medication in detail and the difference between the maintenance= "automatic" meds and the prns using an action plan format for the latter (If this problem/symptom => do that organization reading Left to right).  Please see AVS from this visit for a full list of these instructions which I personally wrote for this pt and  are unique to this visit.

## 2017-08-12 NOTE — Assessment & Plan Note (Signed)
In the best review of chronic cough to date ( NEJM 2016 375 [id number]) ,  ACEi are now felt to cause cough in up to  20% of pts which is a 4 fold increase from previous reports and does not include the variety of non-specific complaints we see in pulmonary clinic in pts on ACEi but previously attributed to another dx like  Copd/asthma and  include PNDS, throat and chest congestion, "bronchitis", unexplained dyspnea and noct "strangling" sensations, and hoarseness, but also  atypical /refractory GERD symptoms like dysphagia and "bad heartburn"   The only way I know  to prove this is not an "ACEi Case" is a trial off ACEi x a minimum of 6 weeks then regroup.   Try losartan 100/25 mg one daily until return

## 2017-08-12 NOTE — Patient Instructions (Addendum)
Stop lisinopril and start losartan 100/25  one daily in its place and your breathing should gradually improve over several weeks  Plan A = Automatic = symbicort 80 Take 2 puffs first thing in am and then another 2 puffs about 12 hours later and stop spiriva .   Work on inhaler technique:  relax and gently blow all the way out then take a nice smooth deep breath back in, triggering the inhaler at same time you start breathing in.  Hold for up to 5 seconds if you can. Blow out thru nose. Rinse and gargle with water when done      Plan B = Backup Only use your albuterol as a rescue medication to be used if you can't catch your breath by resting or doing a relaxed purse lip breathing pattern.  - The less you use it, the better it will work when you need it. - Ok to use the inhaler up to 2 puffs  every 4 hours if you must but call for appointment if use goes up over your usual need - Don't leave home without it !!  (think of it like the spare tire for your car)   Plan C = Crisis - only use your albuterol nebulizer if you first try Plan B and it fails to help > ok to use the nebulizer up to every 4 hours but if start needing it regularly call for immediate appointment   Plan D = Doctor - call me if B and C not adequate  Plan E = ER - go to ER or call 911 if all else fails      If all else fails > Prednisone 10 mg take  4 each am x 2 days,   2 each am x 2 days,  1 each am x 2 days and stop      Please schedule a follow up office visit in 6 weeks, call sooner if needed with all medications /inhalers/ solutions in hand so we can verify exactly what you are taking. This includes all medications from all doctors and over the counters

## 2017-08-12 NOTE — Progress Notes (Signed)
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   ASSESSMENT / PLAN:  Acute respiratory failure with hypoxemia from influenza and super bacterial infection with ARDS (resolved now) Presumed COPD with wheezing improving with bronchodilators and steroid  Tobacco abuse Continue bronchodilators and has completed steroid taper 10/25/16 Continue incentive spirometry Consult PT to mobilize, they are recommending 24 hr supervision or SNF, pt declining SNF at this time, she will go home with daughter with Home health services.  Strongly advised patient to avoid smoking and using tobacco and second hand smoke.  Discharging home on home oxygen 3L/min, home nebulizer and bronchodilator therapy, close follow up with PCP.  Recommend outpatient pulmonology referral.   CARDIOVASCULAR A:  Demand ischemia Septic shock with Hypotension requiring pressor - off pressor 5/17 Grd 2 diastolic dysfunction Hypertension Resume home Zestoretic at discharge  AKI, likely from ATN: Resolved Hypokalemia: K 3.2. Suspect from vomiting and insensible losses. Repleted  GASTROINTESTINAL GERD Treated with famotidine for GERD and GI protection  HEMATOLOGIC Normocytic anemia: Hb 9-10 though now trending 8 over last two days. No signs of overt blood loss other than bloody sputum described above. Leukocytosis: Stable at 15-16, on steroids, but discontinue them 4/29.     INFECTIOUS A:  Influenza B (by report, can't see lab result from outside hospital) with possible bacterial superinfection: s/p antimicrobial therapy. PCT  reassuring. Completed treatment        08/12/2017 1st [facility] office visit/ Keatyn Jawad   Re sob/ cough with no obst on spirometry on ACEi  Chief Complaint  Patient presents with  . Pulmonary Consult    Self referral. Pt states that her breathing has been progressively worse since April 2018. She states she quit smoking and gained approx 50 lbs. She states that she gets out of breath just walking from room to room at home. She states she tends to catch colds easily. She started coughing just last night with clear sputum. She has o2 at home that she was advised to use with sleep but she admits to not using it.    after left the hospital p above atdmit never regained baselline activity tolerance with doe   gradually worse  assoc with wt gain  with severe episodes of cough she assoc with recurrent uri's "they say I have bronchitis and copd"  most recently in Dec 2018 rx abx prednisone and maint spiriva/ symbicort  But no better = MMRC3 = can't walk 100 yards even at a slow pace at a flat grade s stopping due to sob  / cough is dry/ harsh/hacking  wprse day > noct   No obvious day to day or daytime variability or assoc excess/ purulent sputum or mucus plugs or hemoptysis or cp or chest tightness, subjective wheeze or overt sinus or hb symptoms. No unusual exposure hx or h/o childhood pna/ asthma or knowledge of premature birth.  Also denies any obvious fluctuation of symptoms with weather or environmental changes or other aggravating or alleviating factors except as outlined above   Current Allergies, Complete Past Medical History,  Past Surgical History, Family History, and Social History were reviewed in [employer] record.  ROS  The following are not active complaints unless bolded Hoarseness, sore throat, dysphagia, dental problems, itching, sneezing,  nasal congestion or discharge of excess mucus or purulent secretions, ear ache,   fever, chills, sweats, unintended wt  loss or wt gain, classically pleuritic or exertional cp,  orthopnea pnd or leg swelling, presyncope, palpitations, abdominal pain, anorexia, nausea, vomiting, diarrhea  or change in bowel habits or change in bladder habits, change in stools or change in urine, dysuria, hematuria,  rash, arthralgias, visual complaints, headache, numbness, weakness or ataxia or problems with walking or coordination,  change in mood/affect or memory.        Current Meds  Medication Sig  . aspirin EC 81 MG EC tablet Take 1 tablet (81 mg total) by mouth daily.  . clonazePAM (KLONOPIN) 1 MG tablet Take 1 mg by mouth 3 (three) times daily as needed for anxiety.   Marland Kitchen escitalopram (LEXAPRO) 10 MG tablet Take 1 tablet by mouth daily.  Marland Kitchen ipratropium-albuterol (DUONEB) 0.5-2.5 (3) MG/3ML SOLN Take 3 mLs by nebulization 3 (three) times daily.  Marland Kitchen PROAIR HFA 108 (90 Base) MCG/ACT inhaler Inhale 2 puffs into the lungs every 6 (six) hours. Scheduled doses.  . SYMBICORT 80-4.5 MCG/ACT inhaler Inhale 2 puffs into the lungs daily.  . [  lisinopril-hydrochlorothiazide (PRINZIDE,ZESTORETIC) 20-25 MG tablet Take 1 tablet by mouth daily.           Review of Systems     Objective:   Physical Exam    obese hoarse bf with harsh upper airway cough    Wt Readings from Last 3 Encounters:  08/12/17 231 lb (104.8 kg)  10/25/16 190 lb (86.2 kg)  08/10/16 203 lb (92.1 kg)     Vital signs reviewed - Note on arrival 02 sats  97% on RA   HEENT: nl dentition, turbinates bilaterally, and oropharynx. Nl external ear canals without cough reflex   NECK :  without JVD/Nodes/TM/ nl carotid upstrokes bilaterally   LUNGS: no acc muscle use,  Nl contour chest with mostly upper airway transmitted wheeze    CV:  RRR  no s3 or murmur or increase in P2, and no edema   ABD:  Quite soft and nontender with limited inspiratory excursion in the supine position. No bruits or organomegaly appreciated, bowel sounds nl  MS:  Nl gait/ ext warm  without deformities, calf tenderness, cyanosis or clubbing No obvious joint restrictions   SKIN: warm and dry without lesions    NEURO:  alert, approp, nl sensorium with  no motor or cerebellar deficits apparent.         Assessment:

## 2017-08-16 DIAGNOSIS — J441 Chronic obstructive pulmonary disease with (acute) exacerbation: Secondary | ICD-10-CM | POA: Diagnosis not present

## 2017-08-16 DIAGNOSIS — Z6841 Body Mass Index (BMI) 40.0 and over, adult: Secondary | ICD-10-CM | POA: Diagnosis not present

## 2017-08-16 DIAGNOSIS — J22 Unspecified acute lower respiratory infection: Secondary | ICD-10-CM | POA: Diagnosis not present

## 2017-08-26 DIAGNOSIS — J9621 Acute and chronic respiratory failure with hypoxia: Secondary | ICD-10-CM | POA: Diagnosis not present

## 2017-09-14 DIAGNOSIS — R5383 Other fatigue: Secondary | ICD-10-CM | POA: Diagnosis not present

## 2017-09-14 DIAGNOSIS — D649 Anemia, unspecified: Secondary | ICD-10-CM | POA: Diagnosis not present

## 2017-09-14 DIAGNOSIS — Z1389 Encounter for screening for other disorder: Secondary | ICD-10-CM | POA: Diagnosis not present

## 2017-09-14 DIAGNOSIS — G4733 Obstructive sleep apnea (adult) (pediatric): Secondary | ICD-10-CM | POA: Diagnosis not present

## 2017-09-14 DIAGNOSIS — Z6841 Body Mass Index (BMI) 40.0 and over, adult: Secondary | ICD-10-CM | POA: Diagnosis not present

## 2017-09-22 DIAGNOSIS — R0602 Shortness of breath: Secondary | ICD-10-CM | POA: Diagnosis not present

## 2017-09-22 DIAGNOSIS — I10 Essential (primary) hypertension: Secondary | ICD-10-CM | POA: Diagnosis not present

## 2017-09-22 DIAGNOSIS — Z87891 Personal history of nicotine dependence: Secondary | ICD-10-CM | POA: Diagnosis not present

## 2017-09-22 DIAGNOSIS — J189 Pneumonia, unspecified organism: Secondary | ICD-10-CM | POA: Diagnosis not present

## 2017-09-22 DIAGNOSIS — J449 Chronic obstructive pulmonary disease, unspecified: Secondary | ICD-10-CM | POA: Diagnosis not present

## 2017-09-22 DIAGNOSIS — Z79899 Other long term (current) drug therapy: Secondary | ICD-10-CM | POA: Diagnosis not present

## 2017-09-23 ENCOUNTER — Ambulatory Visit: Payer: Self-pay | Admitting: Internal Medicine

## 2017-09-23 ENCOUNTER — Telehealth: Payer: Self-pay | Admitting: Internal Medicine

## 2017-09-23 NOTE — Telephone Encounter (Signed)
Called and spoke with patient, her pharmacy states that Losartan is on back order. Patient is out of her BP med and is needing something.   MW please advise on what needs to be called in, thanks.

## 2017-09-23 NOTE — Telephone Encounter (Signed)
Attempted to call pt but no answer.   Left message for pt to return our call x1 

## 2017-09-23 NOTE — Telephone Encounter (Signed)
micardis 80/25 one daily - if too strong can break in half and get 50% discount

## 2017-09-24 DIAGNOSIS — J9621 Acute and chronic respiratory failure with hypoxia: Secondary | ICD-10-CM | POA: Diagnosis not present

## 2017-09-24 MED ORDER — TELMISARTAN-HCTZ 80-25 MG PO TABS: 1.0000 | ORAL_TABLET | Freq: Every day | ORAL | 5 refills | Status: DC

## 2017-09-24 NOTE — Telephone Encounter (Signed)
Spoke with patient. She is aware of the medication change. She wishes to have this called into [facility] in [place]. She verbalized understanding on dosage and instructions. Nothing else needed at time of call.

## 2017-10-04 DIAGNOSIS — J80 Acute respiratory distress syndrome: Secondary | ICD-10-CM | POA: Diagnosis not present

## 2017-10-04 DIAGNOSIS — J9621 Acute and chronic respiratory failure with hypoxia: Secondary | ICD-10-CM | POA: Diagnosis not present

## 2017-10-04 DIAGNOSIS — G4733 Obstructive sleep apnea (adult) (pediatric): Secondary | ICD-10-CM | POA: Diagnosis not present

## 2017-10-11 ENCOUNTER — Ambulatory Visit (INDEPENDENT_AMBULATORY_CARE_PROVIDER_SITE_OTHER)
Admission: RE | Admit: 2017-10-11 | Discharge: 2017-10-11 | Disposition: A | Discharge status: Home / Self Care | Payer: BLUE CROSS/BLUE SHIELD | Source: Ambulatory Visit | Attending: Internal Medicine | Admitting: Internal Medicine

## 2017-10-11 ENCOUNTER — Ambulatory Visit: Payer: BLUE CROSS/BLUE SHIELD | Admitting: Internal Medicine

## 2017-10-11 ENCOUNTER — Other Ambulatory Visit (INDEPENDENT_AMBULATORY_CARE_PROVIDER_SITE_OTHER): Payer: BLUE CROSS/BLUE SHIELD

## 2017-10-11 ENCOUNTER — Encounter: Payer: Self-pay | Admitting: Internal Medicine

## 2017-10-11 VITALS — BP 122/80 | HR 90 | Ht 60.0 in | Wt 229.0 lb

## 2017-10-11 DIAGNOSIS — R058 Other specified cough: Secondary | ICD-10-CM

## 2017-10-11 DIAGNOSIS — J449 Chronic obstructive pulmonary disease, unspecified: Secondary | ICD-10-CM

## 2017-10-11 DIAGNOSIS — R05 Cough: Secondary | ICD-10-CM

## 2017-10-11 DIAGNOSIS — I10 Essential (primary) hypertension: Secondary | ICD-10-CM

## 2017-10-11 LAB — CBC WITH DIFFERENTIAL/PLATELET
BASOS PCT: 0.3 % (ref 0.0–3.0)
Basophils Absolute: 0 10*3/uL (ref 0.0–0.1)
EOS ABS: 0.1 10*3/uL (ref 0.0–0.7)
EOS PCT: 1.7 % (ref 0.0–5.0)
HEMATOCRIT: 32.8 % — AB (ref 36.0–46.0)
HEMOGLOBIN: 10.6 g/dL — AB (ref 12.0–15.0)
Lymphocytes Relative: 22.2 % (ref 12.0–46.0)
Lymphs Abs: 1.8 10*3/uL (ref 0.7–4.0)
MCHC: 32.2 g/dL (ref 30.0–36.0)
MCV: 76.8 fl — AB (ref 78.0–100.0)
MONOS PCT: 4.9 % (ref 3.0–12.0)
Monocytes Absolute: 0.4 10*3/uL (ref 0.1–1.0)
NEUTROS ABS: 5.9 10*3/uL (ref 1.4–7.7)
Neutrophils Relative %: 70.9 % (ref 43.0–77.0)
PLATELETS: 517 10*3/uL — AB (ref 150.0–400.0)
RBC: 4.27 Mil/uL (ref 3.87–5.11)
RDW: 17.6 % — ABNORMAL HIGH (ref 11.5–15.5)
WBC: 8.3 10*3/uL (ref 4.0–10.5)

## 2017-10-11 LAB — TSH: TSH: 0.27 u[IU]/mL — ABNORMAL LOW (ref 0.35–4.50)

## 2017-10-11 MED ORDER — FAMOTIDINE 20 MG PO TABS: ORAL_TABLET | ORAL | 11 refills | Status: DC

## 2017-10-11 MED ORDER — PANTOPRAZOLE SODIUM 40 MG PO TBEC: 40.0000 mg | DELAYED_RELEASE_TABLET | Freq: Every day | ORAL | 2 refills | Status: DC

## 2017-10-11 NOTE — Progress Notes (Signed)
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   ASSESSMENT / PLAN:  Acute respiratory failure with hypoxemia from influenza and super bacterial infection with ARDS (resolved now) Presumed COPD with wheezing improving with bronchodilators and steroid  Tobacco abuse Continue bronchodilators and has completed steroid taper 10/25/16 Continue incentive spirometry Consult PT to mobilize, they are recommending 24 hr supervision or SNF, pt declining SNF at this time, she will go home with daughter with Home health services.  Strongly advised patient to avoid smoking and using tobacco and second hand smoke.  Discharging home on home oxygen 3L/min, home nebulizer and bronchodilator therapy, close follow up with PCP.  Recommend outpatient pulmonology referral.   CARDIOVASCULAR A:  Demand ischemia Septic shock with Hypotension requiring pressor - off pressor 3/38 Grd 2 diastolic dysfunction Hypertension Resume home Zestoretic at discharge  AKI, likely from ATN: Resolved Hypokalemia: K 3.2. Suspect from vomiting and insensible losses. Repleted  GASTROINTESTINAL GERD Treated with famotidine for GERD and GI protection  HEMATOLOGIC Normocytic anemia: Hb 9-10 though now trending 8 over last two days. No signs of overt blood loss other than bloody sputum described above. Leukocytosis: Stable at 15-16, on steroids, but discontinue them 4/29.     INFECTIOUS A:  Influenza B (by report, can't see lab result from outside hospital) with possible bacterial superinfection: s/p  antimicrobial therapy. PCT reassuring. Completed treatment        08/12/2017 1st [facility] office visit/ [name]   Re sob/ cough with no obst on spirometry on ACEi  Chief Complaint  Patient presents with  . Pulmonary Consult    Self referral. Pt states that her breathing has been progressively worse since April 2018. She states she quit smoking and gained approx 50 lbs. She states that she gets out of breath just walking from room to room at home. She states she tends to catch colds easily. She started coughing just last night with clear sputum. She has o2 at home that she was advised to use with sleep but she admits to not using it.    after left the hospital p above atdmit never regained baselline activity tolerance with doe   gradually worse  assoc with wt gain  with severe episodes of cough she assoc with recurrent uri's "they say I have bronchitis and copd"  most recently in Dec 2018 rx abx prednisone and maint spiriva/ symbicort  But no better = MMRC3 = can't walk 100 yards even at a slow pace at a flat grade s stopping due to sob  / cough is dry/ harsh/hacking  worse day > noct  rec Stop lisinopril and start losartan 100/25  one daily in its place and your breathing should gradually improve over several weeks Plan A = Automatic = symbicort 80 Take 2 puffs first thing in am and then another 2 puffs about 12 hours later and stop spiriva Work on inhaler technique: Plan B = Backup Only use your albuterol as a rescue medication Plan C = Crisis - only use your albuterol  nebulizer if you first try Plan B and it fails to help > ok to use the nebulizer up to every 4 hours but if start needing it regularly call for immediate appointment Prednisone 10 mg take  4 each am x 2 days,   2 each am x 2 days,  1 each am x 2 days and stop   Aug 25 2017 seen for AB>> [facility]  pred/ abx   10/11/2017  f/u ov/[name] re:  Uacs AB/ off acei since 08/12/17  Chief Complaint  Patient presents with  .  Follow-up    Breathing has improved. She is not coughing any less. She is using her albuterol inhaler 1 x per wk on average and has not needed neb.   Dyspnea:  Much better and feels doe due to out of shape/ walks slow pacer and does steps ok now = one flight  Only   Cough: with temp changes / assoc nasal congestion  Sleep: with cpap  SABA use:  Neb with colds only    No obvious day to day or daytime variability or assoc excess/ purulent sputum or mucus plugs or hemoptysis or cp or chest tightness, subjective wheeze or overt hb symptoms. No unusual exposure hx or h/o childhood pna/ asthma or knowledge of premature birth.  Sleeping ok with cppa  without nocturnal  or early am exacerbation  of respiratory  c/o's or need for noct saba. Also denies any obvious fluctuation of symptoms with weather or environmental changes or other aggravating or alleviating factors except as outlined above   Current Allergies, Complete Past Medical History, Past Surgical History, Family History, and Social History were reviewed in [employer] record.  ROS  The following are not active complaints unless bolded Hoarseness, sore throat, dysphagia, dental problems, itching, sneezing,  nasal congestion or discharge of excess mucus or purulent secretions, ear ache,   fever, chills, sweats, unintended wt loss or wt gain, classically pleuritic or exertional cp,  orthopnea pnd or leg swelling, presyncope, palpitations, abdominal pain, anorexia, nausea, vomiting, diarrhea  or change in bowel habits or change in bladder habits, change in stools or change in urine, dysuria, hematuria,  rash, arthralgias, visual complaints, headache, numbness, weakness or ataxia or problems with walking or coordination,  change in mood/affect or memory.        Current Meds  Medication Sig  . aspirin EC 81 MG EC tablet Take 1 tablet (81 mg total) by mouth daily.  . clonazePAM (KLONOPIN) 1 MG tablet Take 1 mg by mouth 3  (three) times daily as needed for anxiety.   Marland Kitchen escitalopram (LEXAPRO) 10 MG tablet Take 1 tablet by mouth daily.  Marland Kitchen ipratropium-albuterol (DUONEB) 0.5-2.5 (3) MG/3ML SOLN Take 3 mLs by nebulization 3 (three) times daily.  Marland Kitchen PROAIR HFA 108 (90 Base) MCG/ACT inhaler Inhale 2 puffs into the lungs every 6 (six) hours. Scheduled doses.  . SYMBICORT 80-4.5 MCG/ACT inhaler Inhale 2 puffs into the lungs daily.  Marland Kitchen telmisartan-hydrochlorothiazide (MICARDIS HCT) 80-25 MG tablet Take 1 tablet by mouth daily.                    Objective:   Physical Exam   amb obese bf nad/ still some throat clearing     10/11/2017       239   08/12/17 231 lb (104.8 kg)  10/25/16 190 lb (86.2 kg)  08/10/16 203 lb (92.1 kg)    Vital signs reviewed - Note on arrival  02 sats  97% on RA  And   BP 122/80   HEENT: nl dentition, turbinates bilaterally, and oropharynx. Nl external ear canals without cough reflex   NECK :  without JVD/Nodes/TM/ nl carotid upstrokes bilaterally   LUNGS: no acc muscle use,  Nl contour chest which is clear to A and P bilaterally without cough on insp or exp maneuvers   CV:  RRR  no s3 or murmur or increase in P2, and no edema   ABD:  Quite obese but soft and nontender with limited  inspiratory excursion in the supine position. No bruits or organomegaly appreciated, bowel sounds nl  MS:  Nl gait/ ext warm without deformities, calf tenderness, cyanosis or clubbing No obvious joint restrictions   SKIN: warm and dry without lesions    NEURO:  alert, approp, nl sensorium with  no motor or cerebellar deficits apparent.         CXR PA and Lateral:   10/11/2017 :    I personally reviewed images and agree with radiology impression as follows:   Cardiac shadow is stable. Bibasilar linear densities are noted stable from the prior exam consistent with focal atelectatic changes/early scarring. No new focal infiltrate is seen no sizable effusion is noted. No bony abnormality is  seen. My review:  Elevated R HD is longstanding    Labs ordered 10/11/2017    Allergy profile        Assessment:

## 2017-10-11 NOTE — Assessment & Plan Note (Signed)
Quit smoking 09/2016 - Spirometry 08/12/2017  FEV1 1.21 (65%)  Ratio 89 p am symb/spiriva dpi - 08/12/2017    75% from a baseline of < 25% continue symb 80 2bid and off spiriva dpi and off acei  - 10/11/2017 much improved > continue symb 80 2bid and return for full pfts

## 2017-10-11 NOTE — Assessment & Plan Note (Signed)
Try off 08/12/2017 due to pseudo copd > improved 10/11/2017    Adequate control on present rx, reviewed in detail with pt > no change in rx needed = micoardishct 80-25 one daily    Although even in retrospect it may not be clear the ACEi contributed to the pt's symptoms,  Pt improved off them and adding them back at this point or in the future would risk confusion in interpretation of non-specific respiratory symptoms to which this patient is prone  ie  Better not to muddy the waters here.  F/u per pcp planned

## 2017-10-11 NOTE — Progress Notes (Signed)
Spoke with pt and notified of results per Dr. Wert. Pt verbalized understanding and denied any questions. 

## 2017-10-11 NOTE — Assessment & Plan Note (Addendum)
Body mass index is 44.72 kg/m.  -  Trending up Lab Results  Component Value Date   TSH 0.27 (L) 10/11/2017     Contributing to gerd risk/ doe/reviewed the need and the process to achieve and maintain neg calorie balance > defer f/u primary care including intermittently monitoring thyroid status

## 2017-10-11 NOTE — Assessment & Plan Note (Signed)
Off acei  08/12/17 > improved 10/11/2017 but not resolved Allergy profile 10/11/2017 >  Eos 0. /  IgE   abd rx mas for gerd  Upper airway cough syndrome (previously labeled PNDS),  is so named because it's frequently impossible to sort out how much is  CR/sinusitis with freq throat clearing (which can be related to primary GERD)   vs  causing  secondary (" extra esophageal")  GERD from wide swings in gastric pressure that occur with throat clearing, often  promoting self use of mint and menthol lozenges that reduce the lower esophageal sphincter tone and exacerbate the problem further in a cyclical fashion.   These are the same pts (now being labeled as having "irritable larynx syndrome" by some cough centers) who not infrequently have a history of having failed to tolerate ace inhibitors,  dry powder inhalers or biphosphonates or report having atypical/extraesophageal reflux symptoms that don't respond to standard doses of PPI  and are easily confused as having aecopd or asthma flares by even experienced allergists/ pulmonologists (myself included).   Trial of gerd rx pending allergy w/u   Discussed with pt: The standardized cough guidelines published in Chest by Lissa Morales in 2006 are still the best available and consist of a multiple step process (up to 12!) , not a single office visit,  and are intended  to address this problem logically,  with an alogrithm dependent on response to empiric treatment at  each progressive step  to determine a specific diagnosis with  minimal addtional testing needed. Therefore if adherence is an issue or can't be accurately verified,  it's very unlikely the standard evaluation and treatment will be successful here.    Furthermore, response to therapy (other than acute cough suppression, which should only be used short term with avoidance of narcotic containing cough syrups if possible), can be a gradual process for which the patient is not likely to  perceive  immediate benefit.  Unlike going to an eye doctor where the best perscription is almost always the first one and is immediately effective, this is almost never the case in the management of chronic cough syndromes. Therefore the patient needs to commit up front to consistently adhere to recommendations  for up to 6 weeks of therapy directed at the likely underlying problem(s) before the response can be reasonably evaluated.

## 2017-10-11 NOTE — Patient Instructions (Addendum)
Pantoprazole (protonix) 40 mg   Take  30-60 min before first meal of the day and Pepcid (famotidine)  20 mg one @  bedtime until return to office - this is the best way to tell whether stomach acid is contributing to your problem.     GERD (REFLUX)  is an extremely common cause of respiratory symptoms just like yours , many times with no obvious heartburn at all.    It can be treated with medication, but also with lifestyle changes including elevation of the head of your bed (ideally with 6 inch  bed blocks),  Smoking cessation, avoidance of late meals, excessive alcohol, and avoid fatty foods, chocolate, peppermint, colas, red wine, and acidic juices such as orange juice.  NO MINT OR MENTHOL PRODUCTS SO NO COUGH DROPS   USE SUGARLESS CANDY INSTEAD (Jolley ranchers or Stover's or Life Savers) or even ice chips will also do - the key is to swallow to prevent all throat clearing. NO OIL BASED VITAMINS - use powdered substitutes.   Please remember to go to the lab and x-ray department downstairs in the basement  for your tests - we will call you with the results when they are available.      Please schedule a follow up office visit in 6 weeks, call sooner if needed  - bring all inhalers but don't use them that day -  PFT's on return

## 2017-10-12 ENCOUNTER — Encounter: Payer: Self-pay | Admitting: Internal Medicine

## 2017-10-12 LAB — RESPIRATORY ALLERGY PROFILE REGION II ~~LOC~~
Allergen, Cedar tree, t12: <0.1 kU/L
Allergen, D pternoyssinus,d7: 0.9 kU/L — ABNORMAL HIGH
Allergen, Mouse Urine Protein, e78: <0.1 kU/L
Allergen, Mulberry, t76: <0.1 kU/L
Allergen, Oak,t7: <0.1 kU/L
Allergen, P. notatum, m1: <0.1 kU/L
Aspergillus fumigatus, m3: <0.1 kU/L
Box Elder IgE: <0.1 kU/L
CLASS: 0
CLASS: 0
CLASS: 0
CLASS: 0
CLASS: 0
CLASS: 0
CLASS: 0
CLASS: 0
CLASS: 0
CLASS: 0
CLASS: 0
CLASS: 0
CLASS: 0
Class: 0
Class: 0
Class: 0
Class: 0
Class: 0
Class: 0
Class: 0
Class: 0
Class: 0
Class: 2
Class: 2
Cockroach: 0.31 kU/L — ABNORMAL HIGH
D. farinae: 0.88 kU/L — ABNORMAL HIGH
Elm IgE: <0.1 kU/L
IGE (IMMUNOGLOBULIN E), SERUM: 87 kU/L (ref <=114)
Rough Pigweed  IgE: <0.1 kU/L
Timothy Grass: <0.1 kU/L

## 2017-10-12 LAB — INTERPRETATION:

## 2017-10-21 DIAGNOSIS — F419 Anxiety disorder, unspecified: Secondary | ICD-10-CM | POA: Diagnosis not present

## 2017-10-21 DIAGNOSIS — Z6841 Body Mass Index (BMI) 40.0 and over, adult: Secondary | ICD-10-CM | POA: Diagnosis not present

## 2017-10-21 DIAGNOSIS — N342 Other urethritis: Secondary | ICD-10-CM | POA: Diagnosis not present

## 2017-10-25 DIAGNOSIS — J9621 Acute and chronic respiratory failure with hypoxia: Secondary | ICD-10-CM | POA: Diagnosis not present

## 2017-11-03 DIAGNOSIS — G4733 Obstructive sleep apnea (adult) (pediatric): Secondary | ICD-10-CM | POA: Diagnosis not present

## 2017-11-23 ENCOUNTER — Other Ambulatory Visit: Payer: Self-pay | Admitting: Internal Medicine

## 2017-11-23 DIAGNOSIS — J449 Chronic obstructive pulmonary disease, unspecified: Secondary | ICD-10-CM

## 2017-11-24 ENCOUNTER — Ambulatory Visit: Payer: Self-pay | Admitting: Internal Medicine

## 2017-11-24 DIAGNOSIS — J80 Acute respiratory distress syndrome: Secondary | ICD-10-CM | POA: Diagnosis not present

## 2017-11-24 DIAGNOSIS — J9621 Acute and chronic respiratory failure with hypoxia: Secondary | ICD-10-CM | POA: Diagnosis not present

## 2017-11-24 DIAGNOSIS — G4733 Obstructive sleep apnea (adult) (pediatric): Secondary | ICD-10-CM | POA: Diagnosis not present

## 2017-12-04 DIAGNOSIS — G4733 Obstructive sleep apnea (adult) (pediatric): Secondary | ICD-10-CM | POA: Diagnosis not present

## 2017-12-25 DIAGNOSIS — J9621 Acute and chronic respiratory failure with hypoxia: Secondary | ICD-10-CM | POA: Diagnosis not present

## 2018-01-04 DIAGNOSIS — G4733 Obstructive sleep apnea (adult) (pediatric): Secondary | ICD-10-CM | POA: Diagnosis not present

## 2018-01-24 DIAGNOSIS — J9621 Acute and chronic respiratory failure with hypoxia: Secondary | ICD-10-CM | POA: Diagnosis not present

## 2018-01-26 DIAGNOSIS — M25561 Pain in right knee: Secondary | ICD-10-CM | POA: Diagnosis not present

## 2018-01-26 DIAGNOSIS — F419 Anxiety disorder, unspecified: Secondary | ICD-10-CM | POA: Diagnosis not present

## 2018-01-26 DIAGNOSIS — E119 Type 2 diabetes mellitus without complications: Secondary | ICD-10-CM | POA: Diagnosis not present

## 2018-01-26 DIAGNOSIS — M25562 Pain in left knee: Secondary | ICD-10-CM | POA: Diagnosis not present

## 2018-01-26 DIAGNOSIS — Z6841 Body Mass Index (BMI) 40.0 and over, adult: Secondary | ICD-10-CM | POA: Diagnosis not present

## 2018-01-26 DIAGNOSIS — Z1389 Encounter for screening for other disorder: Secondary | ICD-10-CM | POA: Diagnosis not present

## 2018-02-03 DIAGNOSIS — G4733 Obstructive sleep apnea (adult) (pediatric): Secondary | ICD-10-CM | POA: Diagnosis not present

## 2018-02-24 DIAGNOSIS — J9621 Acute and chronic respiratory failure with hypoxia: Secondary | ICD-10-CM | POA: Diagnosis not present

## 2018-03-06 DIAGNOSIS — G4733 Obstructive sleep apnea (adult) (pediatric): Secondary | ICD-10-CM | POA: Diagnosis not present

## 2018-03-17 DIAGNOSIS — J069 Acute upper respiratory infection, unspecified: Secondary | ICD-10-CM | POA: Diagnosis not present

## 2018-03-17 DIAGNOSIS — B349 Viral infection, unspecified: Secondary | ICD-10-CM | POA: Diagnosis not present

## 2018-03-17 DIAGNOSIS — Z6841 Body Mass Index (BMI) 40.0 and over, adult: Secondary | ICD-10-CM | POA: Diagnosis not present

## 2018-03-25 ENCOUNTER — Encounter (HOSPITAL_COMMUNITY): Payer: Self-pay

## 2018-03-25 ENCOUNTER — Other Ambulatory Visit (HOSPITAL_COMMUNITY): Payer: Self-pay | Admitting: Physician Assistant

## 2018-03-25 ENCOUNTER — Ambulatory Visit (HOSPITAL_COMMUNITY)
Admission: RE | Admit: 2018-03-25 | Discharge: 2018-03-25 | Disposition: A | Discharge status: Home / Self Care | Payer: BLUE CROSS/BLUE SHIELD | Source: Ambulatory Visit | Attending: Physician Assistant | Admitting: Physician Assistant

## 2018-03-25 DIAGNOSIS — M25562 Pain in left knee: Secondary | ICD-10-CM

## 2018-03-25 DIAGNOSIS — M1711 Unilateral primary osteoarthritis, right knee: Secondary | ICD-10-CM | POA: Diagnosis not present

## 2018-03-25 DIAGNOSIS — M25561 Pain in right knee: Secondary | ICD-10-CM

## 2018-03-25 DIAGNOSIS — M1712 Unilateral primary osteoarthritis, left knee: Secondary | ICD-10-CM | POA: Diagnosis not present

## 2018-03-29 DIAGNOSIS — Z6841 Body Mass Index (BMI) 40.0 and over, adult: Secondary | ICD-10-CM | POA: Diagnosis not present

## 2018-03-29 DIAGNOSIS — M1712 Unilateral primary osteoarthritis, left knee: Secondary | ICD-10-CM | POA: Diagnosis not present

## 2018-03-29 DIAGNOSIS — Z1389 Encounter for screening for other disorder: Secondary | ICD-10-CM | POA: Diagnosis not present

## 2018-03-30 ENCOUNTER — Other Ambulatory Visit: Payer: Self-pay | Admitting: Internal Medicine

## 2018-04-12 DIAGNOSIS — G4733 Obstructive sleep apnea (adult) (pediatric): Secondary | ICD-10-CM | POA: Diagnosis not present

## 2018-04-21 IMAGING — DX DG CHEST 2V
2 series · 2 of 2 positions shown · non-contrast
Comparison: 09/22/2017

CLINICAL DATA: Cough and congestion

EXAM:
CHEST - 2 VIEW

[chest pa]
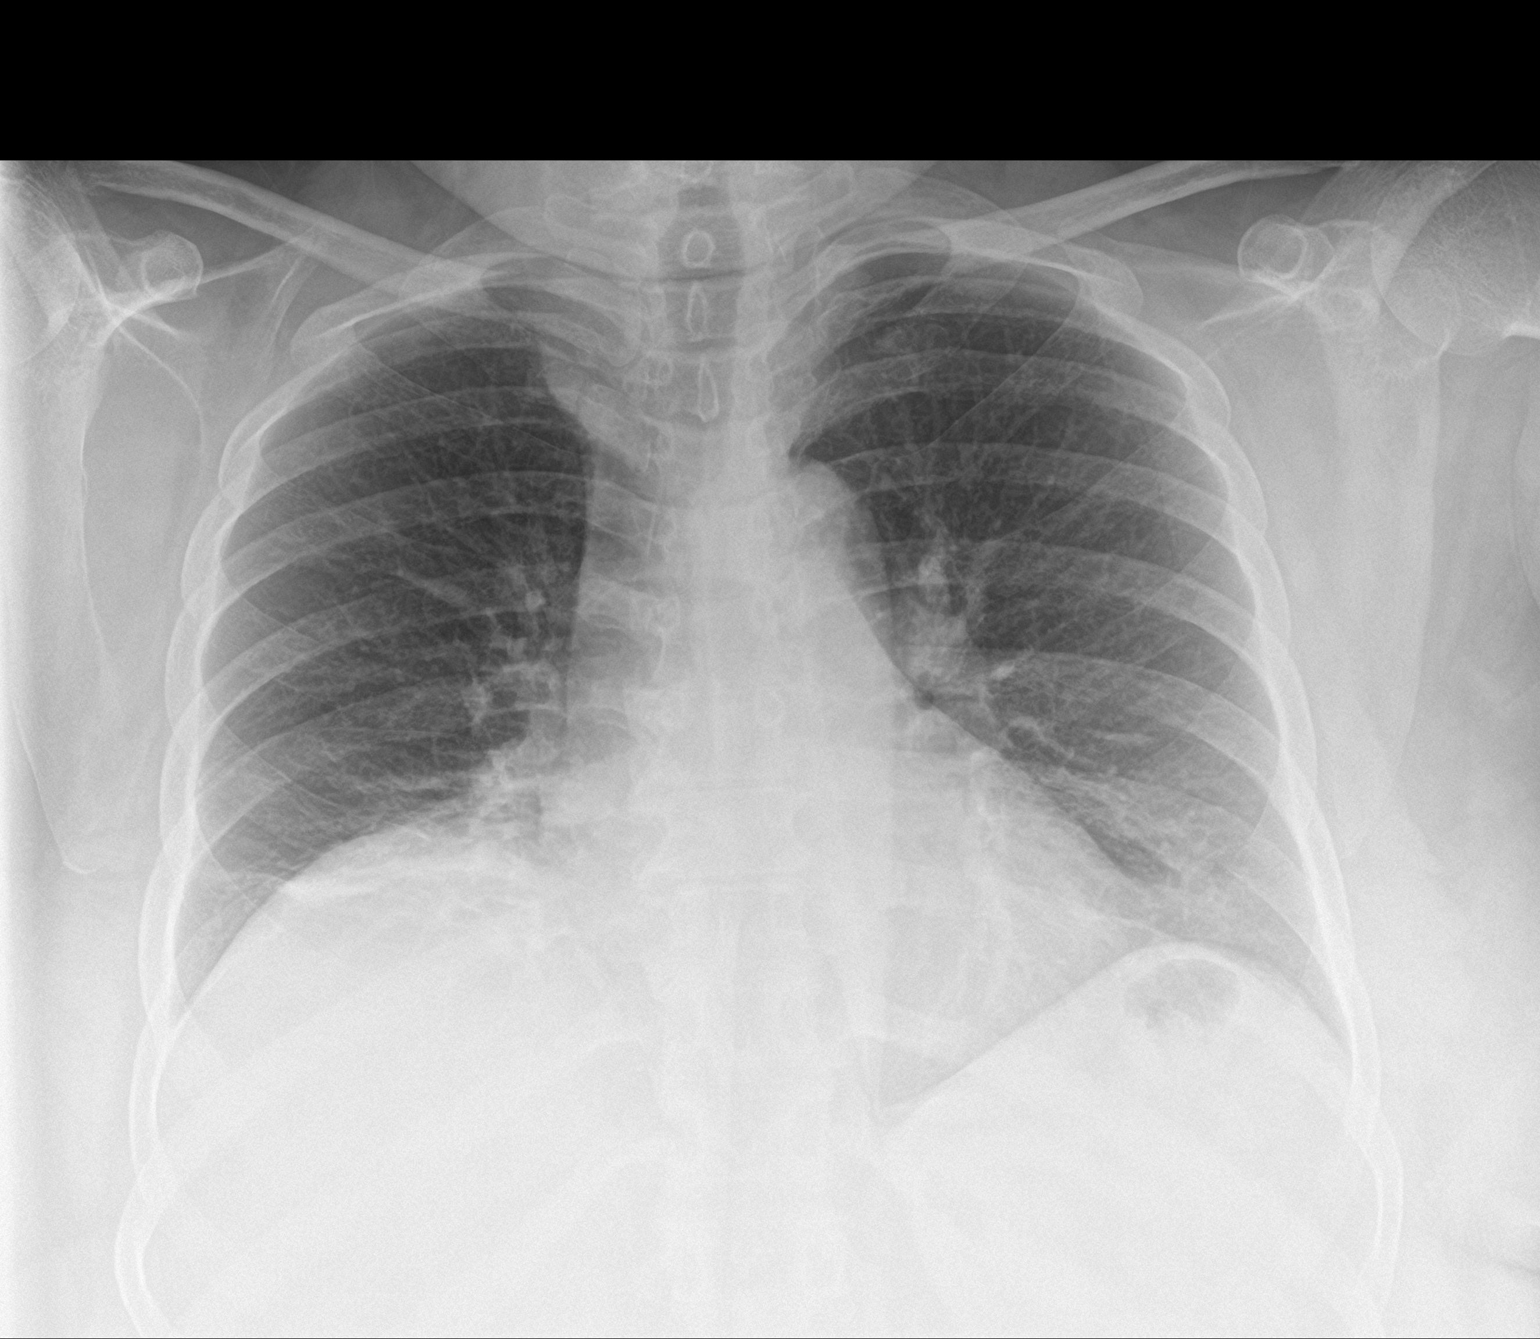

[chest lat]
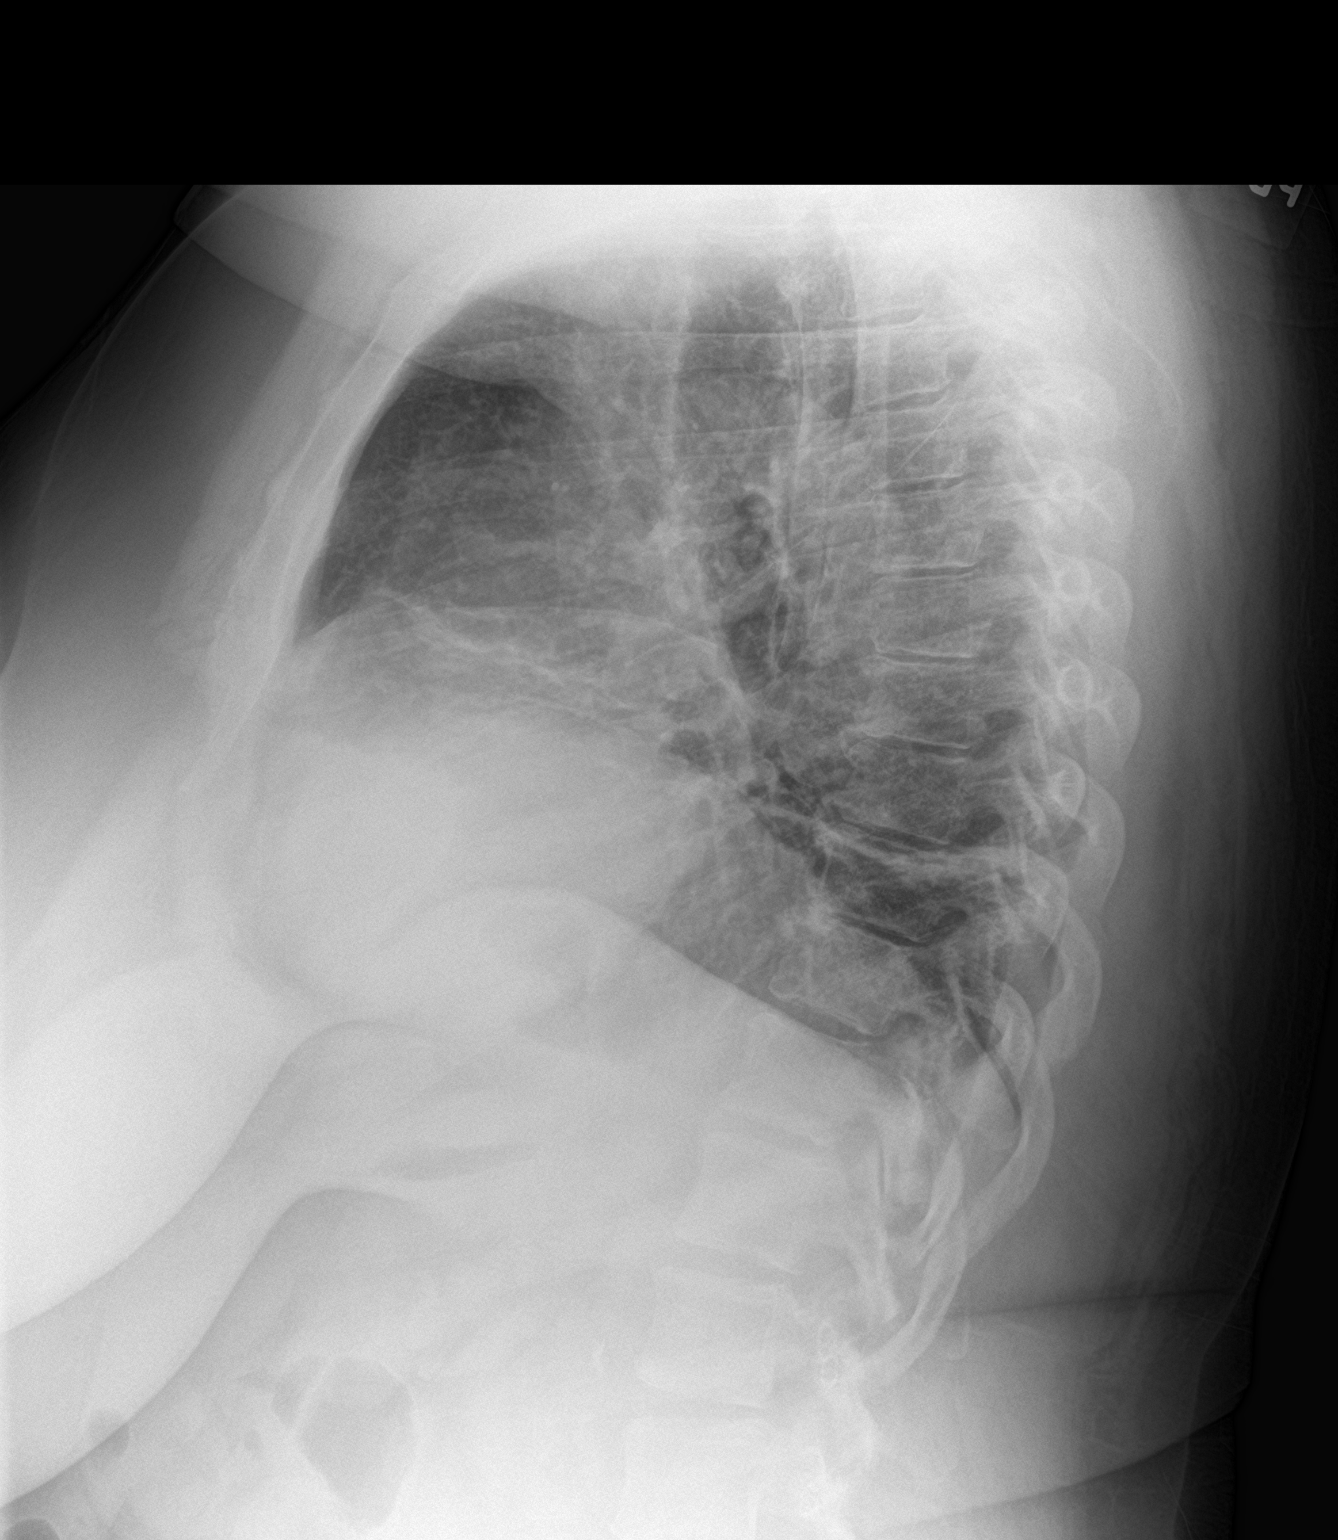

[2 of 2 positions shown; findings below may reference images not displayed]

FINDINGS: Cardiac shadow is stable. Bibasilar linear densities are noted
stable from the prior exam consistent with focal atelectatic
changes/early scarring. No new focal infiltrate is seen no sizable
effusion is noted. No bony abnormality is seen.
IMPRESSION: Stable changes in the right base.

## 2018-04-26 DIAGNOSIS — L659 Nonscarring hair loss, unspecified: Secondary | ICD-10-CM | POA: Diagnosis not present

## 2018-04-26 DIAGNOSIS — Z0001 Encounter for general adult medical examination with abnormal findings: Secondary | ICD-10-CM | POA: Diagnosis not present

## 2018-04-26 DIAGNOSIS — Z23 Encounter for immunization: Secondary | ICD-10-CM | POA: Diagnosis not present

## 2018-04-26 DIAGNOSIS — Z0000 Encounter for general adult medical examination without abnormal findings: Secondary | ICD-10-CM | POA: Diagnosis not present

## 2018-04-26 DIAGNOSIS — Z1389 Encounter for screening for other disorder: Secondary | ICD-10-CM | POA: Diagnosis not present

## 2018-04-26 DIAGNOSIS — F5081 Binge eating disorder: Secondary | ICD-10-CM | POA: Diagnosis not present

## 2018-04-26 DIAGNOSIS — E669 Obesity, unspecified: Secondary | ICD-10-CM | POA: Diagnosis not present

## 2018-04-26 DIAGNOSIS — F419 Anxiety disorder, unspecified: Secondary | ICD-10-CM | POA: Diagnosis not present

## 2018-04-26 DIAGNOSIS — Z6841 Body Mass Index (BMI) 40.0 and over, adult: Secondary | ICD-10-CM | POA: Diagnosis not present

## 2018-05-06 DIAGNOSIS — G4733 Obstructive sleep apnea (adult) (pediatric): Secondary | ICD-10-CM | POA: Diagnosis not present

## 2018-05-24 DIAGNOSIS — L658 Other specified nonscarring hair loss: Secondary | ICD-10-CM | POA: Diagnosis not present

## 2018-06-07 ENCOUNTER — Encounter (HOSPITAL_COMMUNITY): Payer: Self-pay | Admitting: Internal Medicine

## 2018-06-07 ENCOUNTER — Other Ambulatory Visit: Payer: Self-pay

## 2018-06-07 ENCOUNTER — Inpatient Hospital Stay (HOSPITAL_COMMUNITY): Payer: BLUE CROSS/BLUE SHIELD

## 2018-06-07 ENCOUNTER — Inpatient Hospital Stay (HOSPITAL_COMMUNITY): Payer: BLUE CROSS/BLUE SHIELD | Attending: Internal Medicine | Admitting: Internal Medicine

## 2018-06-07 VITALS — BP 127/80 | HR 93 | Temp 97.9°F | Resp 16

## 2018-06-07 DIAGNOSIS — E785 Hyperlipidemia, unspecified: Secondary | ICD-10-CM | POA: Insufficient documentation

## 2018-06-07 DIAGNOSIS — M25561 Pain in right knee: Secondary | ICD-10-CM | POA: Insufficient documentation

## 2018-06-07 DIAGNOSIS — F419 Anxiety disorder, unspecified: Secondary | ICD-10-CM | POA: Insufficient documentation

## 2018-06-07 DIAGNOSIS — F319 Bipolar disorder, unspecified: Secondary | ICD-10-CM | POA: Insufficient documentation

## 2018-06-07 DIAGNOSIS — D509 Iron deficiency anemia, unspecified: Secondary | ICD-10-CM | POA: Insufficient documentation

## 2018-06-07 DIAGNOSIS — M25562 Pain in left knee: Secondary | ICD-10-CM | POA: Insufficient documentation

## 2018-06-07 DIAGNOSIS — Z87891 Personal history of nicotine dependence: Secondary | ICD-10-CM | POA: Insufficient documentation

## 2018-06-07 DIAGNOSIS — K219 Gastro-esophageal reflux disease without esophagitis: Secondary | ICD-10-CM | POA: Diagnosis not present

## 2018-06-07 DIAGNOSIS — Z79899 Other long term (current) drug therapy: Secondary | ICD-10-CM | POA: Insufficient documentation

## 2018-06-07 DIAGNOSIS — D473 Essential (hemorrhagic) thrombocythemia: Secondary | ICD-10-CM | POA: Diagnosis not present

## 2018-06-07 DIAGNOSIS — Z801 Family history of malignant neoplasm of trachea, bronchus and lung: Secondary | ICD-10-CM | POA: Diagnosis not present

## 2018-06-07 DIAGNOSIS — F429 Obsessive-compulsive disorder, unspecified: Secondary | ICD-10-CM | POA: Insufficient documentation

## 2018-06-07 DIAGNOSIS — Z86010 Personal history of colonic polyps: Secondary | ICD-10-CM | POA: Insufficient documentation

## 2018-06-07 DIAGNOSIS — K5900 Constipation, unspecified: Secondary | ICD-10-CM | POA: Diagnosis not present

## 2018-06-07 DIAGNOSIS — I10 Essential (primary) hypertension: Secondary | ICD-10-CM | POA: Diagnosis not present

## 2018-06-07 LAB — COMPREHENSIVE METABOLIC PANEL
ALT: 13 U/L (ref 0–44)
AST: 13 U/L — ABNORMAL LOW (ref 15–41)
Albumin: 4 g/dL (ref 3.5–5.0)
Alkaline Phosphatase: 98 U/L (ref 38–126)
Anion gap: 8 (ref 5–15)
BILIRUBIN TOTAL: 0.6 mg/dL (ref 0.3–1.2)
BUN: 13 mg/dL (ref 6–20)
CO2: 27 mmol/L (ref 22–32)
CREATININE: 0.63 mg/dL (ref 0.44–1.00)
Calcium: 9.4 mg/dL (ref 8.9–10.3)
Chloride: 102 mmol/L (ref 98–111)
GFR calc Af Amer: >60 mL/min (ref >60)
GFR calc non Af Amer: >60 mL/min (ref >60)
Glucose, Bld: 110 mg/dL — ABNORMAL HIGH (ref 70–99)
Potassium: 3.4 mmol/L — ABNORMAL LOW (ref 3.5–5.1)
Sodium: 137 mmol/L (ref 135–145)
Total Protein: 8.2 g/dL — ABNORMAL HIGH (ref 6.5–8.1)

## 2018-06-07 LAB — CBC WITH DIFFERENTIAL/PLATELET
Abs Immature Granulocytes: 0.01 10*3/uL (ref 0.00–0.07)
Basophils Absolute: 0 10*3/uL (ref 0.0–0.1)
Basophils Relative: 1 %
Eosinophils Absolute: 0.1 10*3/uL (ref 0.0–0.5)
Eosinophils Relative: 2 %
HCT: 37.3 % (ref 36.0–46.0)
Hemoglobin: 11.2 g/dL — ABNORMAL LOW (ref 12.0–15.0)
Immature Granulocytes: 0 %
Lymphocytes Relative: 25 %
Lymphs Abs: 1.8 10*3/uL (ref 0.7–4.0)
MCH: 24.7 pg — ABNORMAL LOW (ref 26.0–34.0)
MCHC: 30 g/dL (ref 30.0–36.0)
MCV: 82.3 fL (ref 80.0–100.0)
MONO ABS: 0.4 10*3/uL (ref 0.1–1.0)
MONOS PCT: 6 %
Neutro Abs: 4.8 10*3/uL (ref 1.7–7.7)
Neutrophils Relative %: 66 %
Platelets: 468 10*3/uL — ABNORMAL HIGH (ref 150–400)
RBC: 4.53 MIL/uL (ref 3.87–5.11)
RDW: 16.6 % — ABNORMAL HIGH (ref 11.5–15.5)
WBC: 7.2 10*3/uL (ref 4.0–10.5)
nRBC: 0 % (ref 0.0–0.2)

## 2018-06-07 LAB — VITAMIN B12: Vitamin B-12: 543 pg/mL (ref 180–914)

## 2018-06-07 LAB — LACTATE DEHYDROGENASE: LDH: 117 U/L (ref 98–192)

## 2018-06-07 LAB — FERRITIN: Ferritin: 21 ng/mL (ref 11–307)

## 2018-06-07 LAB — FOLATE: Folate: 15.4 ng/mL (ref >5.9)

## 2018-06-07 MED ORDER — POLYSACCHARIDE IRON COMPLEX 150 MG PO CAPS: 150.0000 mg | ORAL_CAPSULE | Freq: Every day | ORAL | 4 refills | Status: DC

## 2018-06-07 NOTE — Patient Instructions (Signed)
[facility]  Discharge Instructions: You saw Dr. Higgs today                               _______________________________________________________________  Thank you for choosing [facility] at [facility] to provide your oncology and hematology care.  To afford each patient quality time with our providers, please arrive at least 15 minutes before your scheduled appointment.  You need to re-schedule your appointment if you arrive 10 or more minutes late.  We strive to give you quality time with our providers, and arriving late affects you and other patients whose appointments are after yours.  Also, if you no show three or more times for appointments you may be dismissed from the clinic.  Again, thank you for choosing [facility]. Our hope is that these requests will allow you access to exceptional care and in a timely manner. _______________________________________________________________  If you have questions after your visit, please contact our office at [phone] between the hours of 8:30 a.m. and 5:00 p.m. Voicemails left after 4:30 p.m. will not be returned until the following business day. _______________________________________________________________  For prescription refill requests, have your pharmacy contact our office. _______________________________________________________________  Recommendations made by the consultant and any test results will be sent to your referring physician. _______________________________________________________________ 

## 2018-06-07 NOTE — Progress Notes (Signed)
Referring physician:  Dr. Collene Mares  Diagnosis Microcytic anemia - Plan: CBC with Differential/Platelet, Comprehensive metabolic panel, Lactate dehydrogenase, Protein electrophoresis, serum, Ferritin, Vitamin B12, Folate, Methylmalonic acid, serum, Haptoglobin, Hemoglobinopathy evaluation  Staging Cancer Staging No matching staging information was found for the patient.  Assessment and Plan:  1.  Iron deficiency anemia.  57 yr old female referred for evaluation due to anemia.  Chart review shows pt had labs done 10/11/2017 that showed WBC 8.3 HB 10.6 plts 517,000.  She reports problems with constipation.  Pt had colonoscopy 08/10/2016 that showed hyperplastic polyps and tubular adenoma.  Pt denies any blood in stool or urine.  She is seen today for consultation due to anemia.    Labs done today reviewed and showed WBC 7.2 HB 11.2 plts 468,000.  Chemistries WNL with K+ 3.4 Cr 0.63 and normal LFTs.  Ferritin is decreased at 21 which is consistent with IDA.  B12 and folate WNL.  Awaiting SPEP and HB electrophoresis results.    Pt was given option of oral vs IV iron.  She desire oral iron.  Rx for Ferrex sent to pharmacy # 30 with 3 refills. Pt will RTC to go over remaining labs in 2 weeks.    2.  Thrombocytosis.  Plt count is improved at 468,000.  Likely reactive due to IDA.    3.  HTN.  BP is 127/80.  Follow-up with PCP.    4  Health maintenance.  Pt has undergone colonoscopy 08/10/2016 that showed hyperplastic polyps and tubular adenoma.  Follow-up with GI as recommended.  Mammograms as recommended.    5  Constipation.  Stool softeners prn.    40 minutes spent with review of records, counseling and coordination of care.    HPI:  57 yr old female referred for evaluation due to anemia.  Chart review shows pt had labs done 10/11/2017 that showed WBC 8.3 HB 10.6 plts 517,000.  She reports problems with constipation.  Pt had colonoscopy 08/10/2016 that showed hyperplastic polyps and tubular adenoma.  Pt  denies any blood in stool or urine.  She is seen today for consultation due to anemia.    Problem List Patient Active Problem List   Diagnosis Date Noted  . Upper airway cough syndrome [R05] 10/11/2017  . COPD GOLD 0  [J44.9] 08/12/2017  . Morbid obesity due to excess calories ([place]) [E66.01] 08/12/2017  . Acute on chronic respiratory failure with hypoxemia (HCC) [J96.21]   . ARDS (adult respiratory distress syndrome) (HCC) [J80]   . Influenza B [J10.1] 10/10/2016  . Special screening for malignant neoplasms, colon [Z12.11]   . HYPERCHOLESTEROLEMIA [E78.00] 08/03/2009  . BIPOLAR DISORDER UNSPECIFIED [F31.9] 08/03/2009  . OBSESSIVE-COMPULSIVE DISORDER [F42.9] 08/03/2009  . Essential hypertension, benign [I10] 08/03/2009  . KNEE PAIN, BILATERAL [M25.569] 08/03/2009    Past Medical History Past Medical History:  Diagnosis Date  . Anemia   . Anxiety   . Arthritis    in both knees  . Depression   . GERD (gastroesophageal reflux disease)   . Hyperlipidemia   . Hypertension     Past Surgical History Past Surgical History:  Procedure Laterality Date  . CESAREAN SECTION    . cesarian  1990  . COLONOSCOPY N/A 08/10/2016   Procedure: COLONOSCOPY;  Surgeon: Danie Binder, MD;  [facility];  Service: Endoscopy;  Laterality: N/A;  10:30 AM  . fallopian tube removed N/A     Family History Family History  Problem Relation Age of Onset  .  Cancer Mother        lung  . Heart attack Father   . Stroke Paternal Grandmother   . Kidney failure Maternal Grandmother   . Cancer Maternal Grandfather        lung  . Seizures Brother   . Sarcoidosis Sister      Social History  reports that she quit smoking about 20 months ago. Her smoking use included cigarettes. She has a 30.00 pack-year smoking history. She has never used smokeless tobacco. She reports that she drinks alcohol. She reports that she has current or past drug history.  Medications  Current Outpatient  Medications:  .  aspirin EC 81 MG EC tablet, Take 1 tablet (81 mg total) by mouth daily., Disp: , Rfl:  .  clobetasol (TEMOVATE) 0.05 % external solution, , Disp: , Rfl: 3 .  clonazePAM (KLONOPIN) 1 MG tablet, Take 1 mg by mouth 3 (three) times daily as needed for anxiety. , Disp: , Rfl:  .  escitalopram (LEXAPRO) 10 MG tablet, Take 1 tablet by mouth daily., Disp: , Rfl: 10 .  famotidine (PEPCID) 20 MG tablet, One at bedtime, Disp: 30 tablet, Rfl: 11 .  naproxen (NAPROSYN) 375 MG tablet, TK 1 T PO BID, Disp: , Rfl: 10 .  pantoprazole (PROTONIX) 40 MG tablet, Take 1 tablet (40 mg total) by mouth daily. Take 30-60 min before first meal of the day, Disp: 30 tablet, Rfl: 2 .  telmisartan-hydrochlorothiazide (MICARDIS HCT) 80-25 MG tablet, Take 1 tablet by mouth daily., Disp: 30 tablet, Rfl: 5  Allergies Hydrocodone; Oxycodone; Penicillins; Precedex [dexmedetomidine hcl in nacl]; and Seroquel [quetiapine]  Review of Systems Review of Systems - Oncology ROS negative other than constipation   Physical Exam  Vitals Wt Readings from Last 3 Encounters:  10/11/17 229 lb (103.9 kg)  08/12/17 231 lb (104.8 kg)  10/25/16 190 lb (86.2 kg)   Temp Readings from Last 3 Encounters:  06/07/18 97.9 F (36.6 C) (Oral)  10/25/16 98.4 F (36.9 C) (Oral)  08/10/16 97.7 F (36.5 C) (Oral)   BP Readings from Last 3 Encounters:  06/07/18 127/80  10/11/17 122/80  08/12/17 130/76   Pulse Readings from Last 3 Encounters:  06/07/18 93  10/11/17 90  08/12/17 (!) 103   Constitutional: Well-developed, well-nourished, and in no distress.   HENT: Head: Normocephalic and atraumatic.  Mouth/Throat: No oropharyngeal exudate. Mucosa moist. Eyes: Pupils are equal, round, and reactive to light. Conjunctivae are normal. No scleral icterus.  Neck: Normal range of motion. Neck supple. No JVD present.  Cardiovascular: Normal rate, regular rhythm and normal heart sounds.  Exam reveals no gallop and no friction rub.    No murmur heard. Pulmonary/Chest: Effort normal and breath sounds normal. No respiratory distress. No wheezes.No rales.  Abdominal: Soft. Bowel sounds are normal. No distension. There is no tenderness. There is no guarding.  Musculoskeletal: No edema or tenderness.  Lymphadenopathy: No cervical,axillary or supraclavicular adenopathy.  Neurological: Alert and oriented to person, place, and time. No cranial nerve deficit.  Skin: Skin is warm and dry. No rash noted. No erythema. No pallor.  Psychiatric: Affect and judgment normal.   Labs Appointment on 06/07/2018  Component Date Value Ref Range Status  . WBC 06/07/2018 7.2  4.0 - 10.5 K/uL Final  . RBC 06/07/2018 4.53  3.87 - 5.11 MIL/uL Final  . Hemoglobin 06/07/2018 11.2* 12.0 - 15.0 g/dL Final  . HCT 06/07/2018 37.3  36.0 - 46.0 % Final  . MCV 06/07/2018 82.3  80.0 - 100.0 fL Final  . MCH 06/07/2018 24.7* 26.0 - 34.0 pg Final  . MCHC 06/07/2018 30.0  30.0 - 36.0 g/dL Final  . RDW 06/07/2018 16.6* 11.5 - 15.5 % Final  . Platelets 06/07/2018 468* 150 - 400 K/uL Final  . nRBC 06/07/2018 0.0  0.0 - 0.2 % Final  . Neutrophils Relative % 06/07/2018 66  % Final  . Neutro Abs 06/07/2018 4.8  1.7 - 7.7 K/uL Final  . Lymphocytes Relative 06/07/2018 25  % Final  . Lymphs Abs 06/07/2018 1.8  0.7 - 4.0 K/uL Final  . Monocytes Relative 06/07/2018 6  % Final  . Monocytes Absolute 06/07/2018 0.4  0.1 - 1.0 K/uL Final  . Eosinophils Relative 06/07/2018 2  % Final  . Eosinophils Absolute 06/07/2018 0.1  0.0 - 0.5 K/uL Final  . Basophils Relative 06/07/2018 1  % Final  . Basophils Absolute 06/07/2018 0.0  0.0 - 0.1 K/uL Final  . Immature Granulocytes 06/07/2018 0  % Final  . Abs Immature Granulocytes 06/07/2018 0.01  0.00 - 0.07 K/uL Final   Performed at [facility], [address] [place], [place] [address]  . Sodium 06/07/2018 137  135 - 145 mmol/L Final  . Potassium 06/07/2018 3.4* 3.5 - 5.1 mmol/L Final  . Chloride 06/07/2018 102  98 - 111  mmol/L Final  . CO2 06/07/2018 27  22 - 32 mmol/L Final  . Glucose, Bld 06/07/2018 110* 70 - 99 mg/dL Final  . BUN 06/07/2018 13  6 - 20 mg/dL Final  . Creatinine, Ser 06/07/2018 0.63  0.44 - 1.00 mg/dL Final  . Calcium 06/07/2018 9.4  8.9 - 10.3 mg/dL Final  . Total Protein 06/07/2018 8.2* 6.5 - 8.1 g/dL Final  . Albumin 06/07/2018 4.0  3.5 - 5.0 g/dL Final  . AST 06/07/2018 13* 15 - 41 U/L Final  . ALT 06/07/2018 13  0 - 44 U/L Final  . Alkaline Phosphatase 06/07/2018 98  38 - 126 U/L Final  . Total Bilirubin 06/07/2018 0.6  0.3 - 1.2 mg/dL Final  . GFR calc non Af Amer 06/07/2018 >60  >60 mL/min Final  . GFR calc Af Amer 06/07/2018 >60  >60 mL/min Final  . Anion gap 06/07/2018 8  5 - 15 Final   Performed at [facility], [address], [place], [place] [address]  . LDH 06/07/2018 117  98 - 192 U/L Final   Performed at [facility], [address] [place], [place] [address]  . Ferritin 06/07/2018 21  11 - 307 ng/mL Final   Performed at [facility], [address] [place], [place] [address]  . Vitamin B-12 06/07/2018 543  180 - 914 pg/mL Final   Comment: (NOTE) This assay is not validated for testing neonatal or myeloproliferative syndrome specimens for Vitamin B12 levels. Performed at [facility], [address] [place], [place] [address]   . Folate 06/07/2018 15.4  >5.9 ng/mL Final   Performed at [facility], [address], [place], [place] [address]     Pathology Orders Placed This Encounter  Procedures  . CBC with Differential/Platelet    Standing Status:   Future    Number of Occurrences:   1    Standing Expiration Date:   06/08/2019  . Comprehensive metabolic panel    Standing Status:   Future    Number of Occurrences:   1    Standing Expiration Date:   06/08/2019  . Lactate dehydrogenase    Standing Status:  Future    Number of Occurrences:   1    Standing Expiration Date:   06/08/2019  . Protein electrophoresis, serum    Standing Status:   Future    Number  of Occurrences:   1    Standing Expiration Date:   06/08/2019  . Ferritin    Standing Status:   Future    Number of Occurrences:   1    Standing Expiration Date:   06/08/2019  . Vitamin B12    Standing Status:   Future    Number of Occurrences:   1    Standing Expiration Date:   06/08/2019  . Folate    Standing Status:   Future    Number of Occurrences:   1    Standing Expiration Date:   06/08/2019  . Methylmalonic acid, serum    Standing Status:   Future    Number of Occurrences:   1    Standing Expiration Date:   06/08/2019  . Haptoglobin    Standing Status:   Future    Number of Occurrences:   1    Standing Expiration Date:   06/08/2019  . Hemoglobinopathy evaluation    Standing Status:   Future    Number of Occurrences:   1    Standing Expiration Date:   06/08/2019       Zoila Shutter MD

## 2018-06-08 ENCOUNTER — Telehealth (HOSPITAL_COMMUNITY): Payer: Self-pay

## 2018-06-08 LAB — HEMOGLOBINOPATHY EVALUATION
Hgb A2 Quant: 1.9 % (ref 1.8–3.2)
Hgb A: 98.1 % (ref 96.4–98.8)
Hgb C: 0 %
Hgb F Quant: 0 % (ref 0.0–2.0)
Hgb S Quant: 0 %
Hgb Variant: 0 %

## 2018-06-08 LAB — HAPTOGLOBIN: Haptoglobin: 326 mg/dL — ABNORMAL HIGH (ref 34–200)

## 2018-06-08 NOTE — Telephone Encounter (Signed)
Pt given lab results per Dr. Walden Field request and informed that Dr. Walden Field called in Iron pills for pt to start since Ferritin was 21. Pt verbalized understanding and will pick prescription up

## 2018-06-09 LAB — PROTEIN ELECTROPHORESIS, SERUM
A/G RATIO SPE: 1 (ref 0.7–1.7)
Albumin ELP: 3.7 g/dL (ref 2.9–4.4)
Alpha-1-Globulin: 0.3 g/dL (ref 0.0–0.4)
Alpha-2-Globulin: 1 g/dL (ref 0.4–1.0)
Beta Globulin: 1.4 g/dL — ABNORMAL HIGH (ref 0.7–1.3)
GLOBULIN, TOTAL: 3.6 g/dL (ref 2.2–3.9)
Gamma Globulin: 1 g/dL (ref 0.4–1.8)
Total Protein ELP: 7.3 g/dL (ref 6.0–8.5)

## 2018-06-09 LAB — METHYLMALONIC ACID, SERUM: Methylmalonic Acid, Quantitative: 112 nmol/L (ref 0–378)

## 2018-06-20 ENCOUNTER — Ambulatory Visit (HOSPITAL_COMMUNITY): Payer: Self-pay | Admitting: Internal Medicine

## 2018-06-27 ENCOUNTER — Ambulatory Visit: Payer: Self-pay | Admitting: Internal Medicine

## 2018-06-27 DIAGNOSIS — Z0289 Encounter for other administrative examinations: Secondary | ICD-10-CM

## 2018-07-20 DIAGNOSIS — L668 Other cicatricial alopecia: Secondary | ICD-10-CM | POA: Diagnosis not present

## 2018-07-20 DIAGNOSIS — L669 Cicatricial alopecia, unspecified: Secondary | ICD-10-CM | POA: Diagnosis not present

## 2018-08-02 DIAGNOSIS — Z1389 Encounter for screening for other disorder: Secondary | ICD-10-CM | POA: Diagnosis not present

## 2018-08-02 DIAGNOSIS — M5430 Sciatica, unspecified side: Secondary | ICD-10-CM | POA: Diagnosis not present

## 2018-08-02 DIAGNOSIS — Z6841 Body Mass Index (BMI) 40.0 and over, adult: Secondary | ICD-10-CM | POA: Diagnosis not present

## 2018-08-02 DIAGNOSIS — E669 Obesity, unspecified: Secondary | ICD-10-CM | POA: Diagnosis not present

## 2018-08-02 DIAGNOSIS — J019 Acute sinusitis, unspecified: Secondary | ICD-10-CM | POA: Diagnosis not present

## 2018-08-02 DIAGNOSIS — J301 Allergic rhinitis due to pollen: Secondary | ICD-10-CM | POA: Diagnosis not present

## 2018-08-12 DIAGNOSIS — F410 Panic disorder [episodic paroxysmal anxiety] without agoraphobia: Secondary | ICD-10-CM | POA: Diagnosis not present

## 2018-08-12 DIAGNOSIS — J209 Acute bronchitis, unspecified: Secondary | ICD-10-CM | POA: Diagnosis not present

## 2018-08-12 DIAGNOSIS — R05 Cough: Secondary | ICD-10-CM | POA: Diagnosis not present

## 2018-08-12 DIAGNOSIS — R0602 Shortness of breath: Secondary | ICD-10-CM | POA: Diagnosis not present

## 2018-08-12 DIAGNOSIS — J449 Chronic obstructive pulmonary disease, unspecified: Secondary | ICD-10-CM | POA: Diagnosis not present

## 2018-08-12 DIAGNOSIS — Z79899 Other long term (current) drug therapy: Secondary | ICD-10-CM | POA: Diagnosis not present

## 2018-08-12 DIAGNOSIS — I10 Essential (primary) hypertension: Secondary | ICD-10-CM | POA: Diagnosis not present

## 2018-08-12 DIAGNOSIS — Z880 Allergy status to penicillin: Secondary | ICD-10-CM | POA: Diagnosis not present

## 2018-08-12 DIAGNOSIS — Z87891 Personal history of nicotine dependence: Secondary | ICD-10-CM | POA: Diagnosis not present

## 2018-08-12 DIAGNOSIS — Z885 Allergy status to narcotic agent status: Secondary | ICD-10-CM | POA: Diagnosis not present

## 2018-08-17 ENCOUNTER — Ambulatory Visit: Payer: BLUE CROSS/BLUE SHIELD | Admitting: "Endocrinology

## 2018-08-17 ENCOUNTER — Other Ambulatory Visit: Payer: Self-pay | Admitting: "Endocrinology

## 2018-08-17 ENCOUNTER — Encounter: Payer: Self-pay | Admitting: "Endocrinology

## 2018-08-17 VITALS — BP 131/82 | HR 103 | Ht 60.0 in | Wt 222.0 lb

## 2018-08-17 DIAGNOSIS — R7989 Other specified abnormal findings of blood chemistry: Secondary | ICD-10-CM | POA: Diagnosis not present

## 2018-08-17 NOTE — Progress Notes (Signed)
Endocrinology Consult Note                                            08/17/2018, 9:15 AM   Subjective:    Patient ID: Rachel [name], female    DOB: July 17, 1960, PCP Redmond School, MD   Past Medical History:  Diagnosis Date  . Anemia   . Anxiety   . Arthritis    in both knees  . Depression   . GERD (gastroesophageal reflux disease)   . Hyperlipidemia   . Hypertension    Past Surgical History:  Procedure Laterality Date  . CESAREAN SECTION    . cesarian  1990  . COLONOSCOPY N/A 08/10/2016   Procedure: COLONOSCOPY;  Surgeon: Danie Binder, MD;  [facility];  Service: Endoscopy;  Laterality: N/A;  10:30 AM  . fallopian tube removed N/A    Social History   Socioeconomic History  . Marital status: Single    Spouse name: Not on file  . Number of children: Not on file  . Years of education: Not on file  . Highest education level: Not on file  Occupational History  . Occupation: CNA    Comment: [employer]  . Financial resource strain: Not hard at all  . Food insecurity:    Worry: Never true    Inability: Never true  . Transportation needs:    Medical: No    Non-medical: No  Tobacco Use  . Smoking status: Former Smoker    Packs/day: 1.00    Years: 30.00    Pack years: 30.00    Types: Cigarettes    Last attempt to quit: 09/27/2016    Years since quitting: 1.8  . Smokeless tobacco: Never Used  Substance and Sexual Activity  . Alcohol use: Yes    Comment: rarely  . Drug use: Not Currently    Comment: hx of marijuana use  . Sexual activity: Not on file  Lifestyle  . Physical activity:    Days per week: 1 day    Minutes per session: 20 min  . Stress: To some extent  Relationships  . Social connections:    Talks on phone: Three times a week    Gets together: Three times a week    Attends religious service: 1 to 4 times per year    Active member of club or organization: No    Attends meetings of clubs or  organizations: Never    Relationship status: Never married  Other Topics Concern  . Not on file  Social History Narrative   Right handed. Single.  3 kids.  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  Caffeine 3 16 oz coffee, 4 sodas   Outpatient Encounter Medications as of 08/17/2018  Medication Sig  . aspirin EC 81 MG EC tablet Take 1 tablet (81 mg total) by mouth daily.  . clonazePAM (KLONOPIN) 1 MG tablet Take 1 mg by mouth 3 (three) times daily as needed for anxiety.   Marland Kitchen escitalopram (LEXAPRO) 10 MG tablet Take 1 tablet by mouth daily.  . iron polysaccharides (NIFEREX) 150 MG capsule Take 1 capsule (150 mg total) by mouth daily.  Marland Kitchen telmisartan-hydrochlorothiazide (MICARDIS HCT) 80-25 MG tablet Take 1 tablet by mouth daily.  . phentermine 37.5 MG capsule daily.  . [DISCONTINUED] clobetasol (TEMOVATE) 0.05 % external  solution   . [DISCONTINUED] famotidine (PEPCID) 20 MG tablet One at bedtime  . [DISCONTINUED] naproxen (NAPROSYN) 375 MG tablet TK 1 T PO BID  . [DISCONTINUED] pantoprazole (PROTONIX) 40 MG tablet Take 1 tablet (40 mg total) by mouth daily. Take 30-60 min before first meal of the day   No facility-administered encounter medications on file as of 08/17/2018.    ALLERGIES: Allergies  Allergen Reactions  . Hydrocodone Nausea And Vomiting  . Oxycodone Nausea And Vomiting    Codeine, hydrocodone, most pain pills.  . Penicillins Itching and Nausea And Vomiting    Has patient had a PCN reaction causing immediate rash, facial/tongue/throat swelling, SOB or lightheadedness with hypotension: Yes, around the eyes Has patient had a PCN reaction causing severe rash involving mucus membranes or skin necrosis: No Has patient had a PCN reaction that required hospitalization: No Has patient had a PCN reaction occurring within the last 10 years: No  If all of the above answers are "NO", then may proceed with Cephalosporin use.   . Precedex [Dexmedetomidine Hcl In Nacl]     Increased  agitation/confusion in ICU [April 2018]  . Seroquel [Quetiapine]     More agitated/confused per daughter [name] when given in the past.    VACCINATION STATUS: Immunization History  Administered Date(s) Administered  . Influenza-Unspecified 03/29/2017    HPI Rachel [name] is 58 y.o. female who presents today with a medical history as above. she is being seen in consultation for abnormally suppressed TSH requested by Redmond School, MD.  She denies any prior history of thyroid dysfunction.  In April 2019 she was found to have slightly suppressed TSH of 0.27, no associated thyroid hormone measurement. -She complains that she is progressively gaining weight especially so after she quit smoking.  However, review of her medical records indicate that she lost 10 pounds since last year.  She denies palpitations, tremors, nor heat/cold intolerance. She smoked heavily for decades, currently being treated for bronchitis with on and off cough. She denies dysphagia, no odynophagia.  She denies any prior history of goiter nor family history of malignancy of the thyroid.   Review of Systems  Constitutional: + recent weight loss, + fatigue, no subjective hyperthermia, no subjective hypothermia Eyes: no blurry vision, no xerophthalmia ENT: no sore throat, no nodules palpated in throat, no dysphagia/odynophagia, no hoarseness Cardiovascular: no Chest Pain, no Shortness of Breath, no palpitations, no leg swelling Respiratory: + cough, no shortness of breath Gastrointestinal: no Nausea/Vomiting/Diarhhea Musculoskeletal: no muscle/joint aches Skin: no rashes Neurological: no tremors, no numbness, no tingling, no dizziness Psychiatric: no depression, no anxiety  Objective:    BP 131/82   Pulse (!) 103   Ht 5' (1.524 m)   Wt 222 lb (100.7 kg)   BMI 43.36 kg/m   Wt Readings from Last 3 Encounters:  08/17/18 222 lb (100.7 kg)  10/11/17 229 lb (103.9 kg)  08/12/17 231 lb (104.8 kg)     Physical Exam  Constitutional: + Obese for height, not in acute distress, normal state of mind Eyes: PERRLA, EOMI, no exophthalmos ENT: moist mucous membranes, no gross thyromegaly, no gross cervical lymphadenopathy,  Cardiovascular: normal precordial activity, Regular Rate and Rhythm, no Murmur/Rubs/Gallops Respiratory:  adequate breathing efforts, no gross chest deformity, + scattered wheezes on bilateral lung fields.   Gastrointestinal: abdomen soft, Non -tender, No distension, Bowel Sounds present, no gross organomegaly Musculoskeletal: no gross deformities, strength intact in all four extremities Skin: moist, warm, no rashes Neurological: no tremor with  outstretched hands, Deep tendon reflexes normal in bilateral lower extremities.  CMP ( most recent) CMP     Component Value Date/Time   NA 137 06/07/2018 1122   K 3.4 (L) 06/07/2018 1122   CL 102 06/07/2018 1122   CO2 27 06/07/2018 1122   GLUCOSE 110 (H) 06/07/2018 1122   BUN 13 06/07/2018 1122   CREATININE 0.63 06/07/2018 1122   CALCIUM 9.4 06/07/2018 1122   PROT 8.2 (H) 06/07/2018 1122   ALBUMIN 4.0 06/07/2018 1122   AST 13 (L) 06/07/2018 1122   ALT 13 06/07/2018 1122   ALKPHOS 98 06/07/2018 1122   BILITOT 0.6 06/07/2018 1122   GFRNONAA >60 06/07/2018 1122   GFRAA >60 06/07/2018 1122    Lipid Panel     Component Value Date/Time   TRIG 179 (H) 10/21/2016 2039    Lab Results  Component Value Date   TSH 0.27 (L) 10/11/2017           Assessment & Plan:   1. Abnormal thyroid blood test  - Rachel [name]  is being seen at a kind request of Redmond School, MD. - I have reviewed her available thyroid records and clinically evaluated the patient. - Based on these reviews, she has an abnormally suppressed TSH in April 2019, however, there is not sufficient information to proceed with definitive treatment plan.  - she will need a repeat,  more complete thyroid function tests today towards confirming the  diagnosis.  -she will return in 1 week to review her repeat labs for evaluation and treatment decisions if necessary.   If her  labs are suggestive of primary hyperthyroidism, she will be considered for  thyroid uptake and scan to confirm the diagnosis.  - I did not initiate any new prescriptions today.   - I advised her  to maintain close follow up with Redmond School, MD for primary care needs.   - Time spent with the patient: 35 minutes, of which >50% was spent in obtaining information about her symptoms, reviewing her previous labs/studies,  evaluations, and treatments, counseling her about her abnormal thyroid function test, and developing a plan to confirm the diagnosis and long term treatment based on the latest standards of care/guidelines.    Rachel [name] participated in the discussions, expressed understanding, and voiced agreement with the above plans.  All questions were answered to her satisfaction. she is encouraged to contact clinic should she have any questions or concerns prior to her return visit.  Follow up plan: Return in about 1 week (around 08/24/2018) for Labs Today- Non-Fasting Ok.   Glade Lloyd, MD [facility] [address] [place], [place] [address] Phone: [phone]  Fax: [phone]     08/17/2018, 9:15 AM  This note was partially dictated with voice recognition software. Similar sounding words can be transcribed inadequately or may not  be corrected upon review.

## 2018-08-18 DIAGNOSIS — L658 Other specified nonscarring hair loss: Secondary | ICD-10-CM | POA: Diagnosis not present

## 2018-08-18 LAB — TSH: TSH: 0.313 u[IU]/mL — ABNORMAL LOW (ref 0.450–4.500)

## 2018-08-18 LAB — T4, FREE: Free T4: 1.57 ng/dL (ref 0.82–1.77)

## 2018-08-18 LAB — THYROGLOBULIN ANTIBODY: Thyroglobulin Antibody: <1 IU/mL (ref 0.0–0.9)

## 2018-08-18 LAB — THYROID PEROXIDASE ANTIBODY: Thyroperoxidase Ab SerPl-aCnc: 23 IU/mL (ref 0–34)

## 2018-08-18 LAB — T3, FREE: T3, Free: 2.8 pg/mL (ref 2.0–4.4)

## 2018-08-24 ENCOUNTER — Ambulatory Visit (INDEPENDENT_AMBULATORY_CARE_PROVIDER_SITE_OTHER): Payer: BLUE CROSS/BLUE SHIELD | Admitting: "Endocrinology

## 2018-08-24 ENCOUNTER — Encounter: Payer: Self-pay | Admitting: "Endocrinology

## 2018-08-24 VITALS — BP 151/90 | HR 86 | Ht 60.0 in | Wt 220.0 lb

## 2018-08-24 DIAGNOSIS — E0590 Thyrotoxicosis, unspecified without thyrotoxic crisis or storm: Secondary | ICD-10-CM | POA: Diagnosis not present

## 2018-08-24 DIAGNOSIS — Z6841 Body Mass Index (BMI) 40.0 and over, adult: Secondary | ICD-10-CM | POA: Diagnosis not present

## 2018-08-24 DIAGNOSIS — J209 Acute bronchitis, unspecified: Secondary | ICD-10-CM | POA: Diagnosis not present

## 2018-08-24 DIAGNOSIS — R05 Cough: Secondary | ICD-10-CM | POA: Diagnosis not present

## 2018-08-24 NOTE — Progress Notes (Signed)
Endocrinology follow-up  Note                                            08/24/2018, 11:45 AM   Subjective:    Patient ID: Rachel [name], female    DOB: [birth year], PCP Redmond School, MD   Past Medical History:  Diagnosis Date  . Anemia   . Anxiety   . Arthritis    in both knees  . Depression   . GERD (gastroesophageal reflux disease)   . Hyperlipidemia   . Hypertension    Past Surgical History:  Procedure Laterality Date  . CESAREAN SECTION    . cesarian  1990  . COLONOSCOPY N/A 08/10/2016   Procedure: COLONOSCOPY;  Surgeon: Danie Binder, MD;  Location: [facility];  Service: Endoscopy;  Laterality: N/A;  10:30 AM  . fallopian tube removed N/A    Social History   Socioeconomic History  . Marital status: Single    Spouse name: Not on file  . Number of children: Not on file  . Years of education: Not on file  . Highest education level: Not on file  Occupational History  . Occupation: CNA    Comment: [employer]  . Financial resource strain: Not hard at all  . Food insecurity:    Worry: Never true    Inability: Never true  . Transportation needs:    Medical: No    Non-medical: No  Tobacco Use  . Smoking status: Former Smoker    Packs/day: 1.00    Years: 30.00    Pack years: 30.00    Types: Cigarettes    Last attempt to quit: 09/27/2016    Years since quitting: 1.9  . Smokeless tobacco: Never Used  Substance and Sexual Activity  . Alcohol use: Yes    Comment: rarely  . Drug use: Not Currently    Comment: hx of marijuana use  . Sexual activity: Not on file  Lifestyle  . Physical activity:    Days per week: 1 day    Minutes per session: 20 min  . Stress: To some extent  Relationships  . Social connections:    Talks on phone: Three times a week    Gets together: Three times a week    Attends religious service: 1 to 4 times per year    Active member of club or organization: No    Attends meetings of clubs or  organizations: Never    Relationship status: Never married  Other Topics Concern  . Not on file  Social History Narrative   Right handed. Single.  3 kids.  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  Caffeine 3 16 oz coffee, 4 sodas   Outpatient Encounter Medications as of 08/24/2018  Medication Sig  . aspirin EC 81 MG EC tablet Take 1 tablet (81 mg total) by mouth daily.  . clonazePAM (KLONOPIN) 1 MG tablet Take 1 mg by mouth 3 (three) times daily as needed for anxiety.   Marland Kitchen escitalopram (LEXAPRO) 10 MG tablet Take 1 tablet by mouth daily.  . iron polysaccharides (NIFEREX) 150 MG capsule Take 1 capsule (150 mg total) by mouth daily.  . phentermine 37.5 MG capsule daily.  Marland Kitchen telmisartan-hydrochlorothiazide (MICARDIS HCT) 80-25 MG tablet Take 1 tablet by mouth daily.   No facility-administered encounter medications on  file as of 08/24/2018.    ALLERGIES: Allergies  Allergen Reactions  . Hydrocodone Nausea And Vomiting  . Oxycodone Nausea And Vomiting    Codeine, hydrocodone, most pain pills.  . Penicillins Itching and Nausea And Vomiting    Has patient had a PCN reaction causing immediate rash, facial/tongue/throat swelling, SOB or lightheadedness with hypotension: Yes, around the eyes Has patient had a PCN reaction causing severe rash involving mucus membranes or skin necrosis: No Has patient had a PCN reaction that required hospitalization: No Has patient had a PCN reaction occurring within the last 10 years: No  If all of the above answers are "NO", then may proceed with Cephalosporin use.   . Precedex [Dexmedetomidine Hcl In Nacl]     Increased agitation/confusion in ICU [April 2018]  . Seroquel [Quetiapine]     More agitated/confused per daughter [name] when given in the past.    VACCINATION STATUS: Immunization History  Administered Date(s) Administered  . Influenza-Unspecified 03/29/2017    HPI Rachel [name] is 58 y.o. female who presents today with a medical history as above. she  is returning with repeat thyroid function tests for profile after she was seen in consultation for abnormally suppressed TSH requested by Redmond School, MD.  She denies any prior history of thyroid dysfunction.  In April 2019 she was found to have slightly suppressed TSH of 0.27.  Repeat labs show improved profile, see below.  -She complains that she is progressively gaining weight , however her most recent progress is loss of 9 pounds April 2019.    She denies palpitations, tremors, nor heat/cold intolerance. She smoked heavily for decades, currently being treated for bronchitis with on and off cough. She denies dysphagia, no odynophagia.  She denies any prior history of goiter nor family history of malignancy of the thyroid.   Review of Systems  Constitutional: + recent weight loss, + fatigue, no subjective hyperthermia, no subjective hypothermia Eyes: no blurry vision, no xerophthalmia ENT: no sore throat, no nodules palpated in throat, no dysphagia/odynophagia, no hoarseness Cardiovascular: no Chest Pain, no Shortness of Breath, no palpitations, no leg swelling Respiratory: + cough, no shortness of breath Gastrointestinal: no Nausea/Vomiting/Diarhhea Musculoskeletal: no muscle/joint aches Skin: no rashes Neurological: no tremors, no numbness, no tingling, no dizziness Psychiatric: no depression, no anxiety  Objective:    BP (!) 151/90   Pulse 86   Ht 5' (1.524 m)   Wt 220 lb (99.8 kg)   BMI 42.97 kg/m   Wt Readings from Last 3 Encounters:  08/24/18 220 lb (99.8 kg)  08/17/18 222 lb (100.7 kg)  10/11/17 229 lb (103.9 kg)    Physical Exam  Constitutional: + Obese for height, not in acute distress, normal state of mind Eyes: PERRLA, EOMI, no exophthalmos ENT: moist mucous membranes, no gross thyromegaly, no gross cervical lymphadenopathy,  Musculoskeletal: no gross deformities, strength intact in all four extremities Skin: moist, warm, no rashes Neurological: no tremor  with outstretched hands, Deep tendon reflexes normal in bilateral lower extremities.  CMP ( most recent) CMP     Component Value Date/Time   NA 137 06/07/2018 1122   K 3.4 (L) 06/07/2018 1122   CL 102 06/07/2018 1122   CO2 27 06/07/2018 1122   GLUCOSE 110 (H) 06/07/2018 1122   BUN 13 06/07/2018 1122   CREATININE 0.63 06/07/2018 1122   CALCIUM 9.4 06/07/2018 1122   PROT 8.2 (H) 06/07/2018 1122   ALBUMIN 4.0 06/07/2018 1122   AST 13 (L) 06/07/2018 1122  ALT 13 06/07/2018 1122   ALKPHOS 98 06/07/2018 1122   BILITOT 0.6 06/07/2018 1122   GFRNONAA >60 06/07/2018 1122   GFRAA >60 06/07/2018 1122    Lipid Panel     Component Value Date/Time   TRIG 179 (H) 10/21/2016 2039    Lab Results  Component Value Date   TSH 0.313 (L) 08/17/2018   TSH 0.27 (L) 10/11/2017   FREET4 1.57 08/17/2018      Assessment & Plan:   1.  Subclinical hyperthyroidism  -Her previsit complete thyroid function test profile shows mild subclinical hyperthyroidism.  She will not require antithyroid intervention at this time.  She is advised with plan to repeat thyroid function test in 4 months with office visit.    Regarding her weight concern: - Patient admits there is a room for improvement in her diet and drink choices. -  Suggestion is made for her to avoid simple carbohydrates  from her diet including Cakes, Sweet Desserts / Pastries, Ice Cream, Soda (diet and regular), Sweet Tea, Candies, Chips, Cookies, Store Bought Juices, Alcohol in Excess of  1-2 drinks a day, Artificial Sweeteners, and "Sugar-free" Products. This will help avoid unintended weight gain.    - I did not initiate any new prescriptions today.   - I advised her  to maintain close follow up with Redmond School, MD for primary care needs.   Follow up plan: Return in about 4 months (around 12/23/2018) for Follow up with Pre-visit Labs.   Glade Lloyd, MD [facility] [address] [place], [place] [address] Phone: [phone]  Fax: [phone]     08/24/2018, 11:45 AM  This note was partially dictated with voice recognition software. Similar sounding words can be transcribed inadequately or may not  be corrected upon review.

## 2018-08-24 NOTE — Patient Instructions (Signed)

## 2018-10-19 DIAGNOSIS — F419 Anxiety disorder, unspecified: Secondary | ICD-10-CM | POA: Diagnosis not present

## 2018-10-19 DIAGNOSIS — J329 Chronic sinusitis, unspecified: Secondary | ICD-10-CM | POA: Diagnosis not present

## 2018-10-19 DIAGNOSIS — F329 Major depressive disorder, single episode, unspecified: Secondary | ICD-10-CM | POA: Diagnosis not present

## 2018-10-19 DIAGNOSIS — Z6841 Body Mass Index (BMI) 40.0 and over, adult: Secondary | ICD-10-CM | POA: Diagnosis not present

## 2018-10-19 DIAGNOSIS — Z1389 Encounter for screening for other disorder: Secondary | ICD-10-CM | POA: Diagnosis not present

## 2018-11-13 ENCOUNTER — Other Ambulatory Visit: Payer: Self-pay | Admitting: Internal Medicine

## 2018-11-28 ENCOUNTER — Encounter: Payer: Self-pay | Admitting: Hematology

## 2018-11-28 DIAGNOSIS — G4733 Obstructive sleep apnea (adult) (pediatric): Secondary | ICD-10-CM | POA: Diagnosis not present

## 2019-01-19 ENCOUNTER — Other Ambulatory Visit (HOSPITAL_COMMUNITY): Payer: Self-pay | Admitting: Internal Medicine

## 2019-01-26 DIAGNOSIS — M9420 Chondromalacia, unspecified site: Secondary | ICD-10-CM | POA: Diagnosis not present

## 2019-01-26 DIAGNOSIS — Z1389 Encounter for screening for other disorder: Secondary | ICD-10-CM | POA: Diagnosis not present

## 2019-01-26 DIAGNOSIS — M1712 Unilateral primary osteoarthritis, left knee: Secondary | ICD-10-CM | POA: Diagnosis not present

## 2019-01-26 DIAGNOSIS — Z6841 Body Mass Index (BMI) 40.0 and over, adult: Secondary | ICD-10-CM | POA: Diagnosis not present

## 2019-03-01 DIAGNOSIS — G4733 Obstructive sleep apnea (adult) (pediatric): Secondary | ICD-10-CM | POA: Diagnosis not present

## 2019-03-09 DIAGNOSIS — L308 Other specified dermatitis: Secondary | ICD-10-CM | POA: Diagnosis not present

## 2019-03-09 DIAGNOSIS — L918 Other hypertrophic disorders of the skin: Secondary | ICD-10-CM | POA: Diagnosis not present

## 2019-04-05 ENCOUNTER — Encounter: Payer: Self-pay | Admitting: Orthopedic Surgery

## 2019-04-05 ENCOUNTER — Ambulatory Visit (INDEPENDENT_AMBULATORY_CARE_PROVIDER_SITE_OTHER): Payer: BC Managed Care – PPO | Admitting: Orthopedic Surgery

## 2019-04-05 ENCOUNTER — Ambulatory Visit: Payer: BC Managed Care – PPO

## 2019-04-05 ENCOUNTER — Other Ambulatory Visit: Payer: Self-pay

## 2019-04-05 VITALS — BP 134/87 | HR 90 | Ht 60.0 in | Wt 216.6 lb

## 2019-04-05 DIAGNOSIS — M1712 Unilateral primary osteoarthritis, left knee: Secondary | ICD-10-CM

## 2019-04-05 DIAGNOSIS — M25562 Pain in left knee: Secondary | ICD-10-CM | POA: Diagnosis not present

## 2019-04-05 DIAGNOSIS — M23322 Other meniscus derangements, posterior horn of medial meniscus, left knee: Secondary | ICD-10-CM | POA: Diagnosis not present

## 2019-04-05 DIAGNOSIS — G8929 Other chronic pain: Secondary | ICD-10-CM

## 2019-04-05 MED ORDER — IBUPROFEN 800 MG PO TABS: 800.0000 mg | ORAL_TABLET | Freq: Three times a day (TID) | ORAL | 1 refills | Status: DC | PRN

## 2019-04-05 NOTE — Patient Instructions (Addendum)
You have been scheduled for an MRI scan We will call your insurance company to do a precertification to get the MRI covered You will receive a phone call regarding the date of the scan  He is ice 20 to 30 minutes at a time for swelling  His ibuprofen 400 mg 3 times a day or Aleve twice a day for pain  Rest the knee as needed    Meniscus Tear  A meniscus tear is a knee injury that happens when a piece of the meniscus is torn. The meniscus is a thick, rubbery, wedge-shaped cartilage in the knee. Two menisci are located in each knee. They sit between the upper bone (femur) and lower bone (tibia) that make up the knee joint. Each meniscus acts as a shock absorber for the knee. A torn meniscus is one of the most common types of knee injuries. This injury can range from mild to severe. Surgery may be needed to repair a severe tear. What are the causes? This condition may be caused by any kneeling, squatting, twisting, or pivoting movement. Sports-related injuries are the most common cause. These often occur from:  Running and stopping suddenly. ? Changing direction. ? Being tackled or knocked off your feet.  Lifting or carrying heavy weights. As people get older, their menisci get thinner and weaker. In these people, tears can happen more easily, such as from climbing stairs. What increases the risk? You are more likely to develop this condition if you:  Play contact sports.  Have a job that requires kneeling or squatting.  Are female.  Are over 40 years old. What are the signs or symptoms? Symptoms of this condition include:  Knee pain, especially at the side of the knee joint. You may feel pain when the injury occurs, or you may only hear a pop and feel pain later.  A feeling that your knee is clicking, catching, locking, or giving way.  Not being able to fully bend or extend your knee.  Bruising or swelling in your knee. How is this diagnosed? This condition may be diagnosed  based on your symptoms and a physical exam. You may also have tests, such as:  X-rays.  MRI.  A procedure to look inside your knee with a narrow surgical telescope (arthroscopy). You may be referred to a knee specialist (orthopedic surgeon). How is this treated? Treatment for this injury depends on the severity of the tear. Treatment for a mild tear may include:  Rest.  Medicine to reduce pain and swelling. This is usually a nonsteroidal anti-inflammatory drug (NSAID), like ibuprofen.  A knee brace, sleeve, or wrap.  Using crutches or a walker to keep weight off your knee and to help you walk.  Exercises to strengthen your knee (physical therapy). You may need surgery if you have a severe tear or if other treatments are not working. Follow these instructions at home: If you have a brace, sleeve, or wrap:  Wear it as told by your health care provider. Remove it only as told by your health care provider.  Loosen the brace, sleeve, or wrap if your toes tingle, become numb, or turn cold and blue.  Keep the brace, sleeve, or wrap clean and dry.  If the brace, sleeve, or wrap is not waterproof: ? Do not let it get wet. ? Cover it with a watertight covering when you take a bath or shower. Managing pain and swelling   Take over-the-counter and prescription medicines only as told by your  health care provider.  If directed, put ice on your knee: ? If you have a removable brace, sleeve, or wrap, remove it as told by your health care provider. ? Put ice in a plastic bag. ? Place a towel between your skin and the bag. ? Leave the ice on for 20 minutes, 2-3 times per day.  Move your toes often to avoid stiffness and to lessen swelling.  Raise (elevate) the injured area above the level of your heart while you are sitting or lying down. Activity  Do not use the injured limb to support your body weight until your health care provider says that you can. Use crutches or a walker as  told by your health care provider.  Return to your normal activities as told by your health care provider. Ask your health care provider what activities are safe for you.  Perform range-of-motion exercises only as told by your health care provider.  Begin doing exercises to strengthen your knee and leg muscles only as told by your health care provider. After you recover, your health care provider may recommend these exercises to help prevent another injury. General instructions  Use a knee brace, sleeve, or wrap as told by your health care provider.  Ask your health care provider when it is safe to drive if you have a brace, sleeve, or wrap on your knee.  Do not use any products that contain nicotine or tobacco, such as cigarettes, e-cigarettes, and chewing tobacco. If you need help quitting, ask your health care provider.  Ask your health care provider if the medicine prescribed to you: ? Requires you to avoid driving or using heavy machinery. ? Can cause constipation. You may need to take these actions to prevent or treat constipation:  Drink enough fluid to keep your urine pale yellow.  Take over-the-counter or prescription medicines.  Eat foods that are high in fiber, such as beans, whole grains, and fresh fruits and vegetables.  Limit foods that are high in fat and processed sugars, such as fried or sweet foods.  Keep all follow-up visits as told by your health care provider. This is important. Contact a health care provider if:  You have a fever.  Your knee becomes red, tender, or swollen.  Your pain medicine is not helping.  Your symptoms get worse or do not improve after 2 weeks of home care. Summary  A meniscus tear is a knee injury that happens when a piece of the meniscus is torn.  Treatment for this injury depends on the severity of the tear. You may need surgery if you have a severe tear or if other treatments are not working.  Rest, ice, and raise (elevate)  your injured knee as told by your health care provider. This will help lessen pain and swelling.  Contact a health care provider if you have new symptoms, or your symptoms get worse or do not improve after 2 weeks of home care.  Keep all follow-up visits as told by your health care provider. This is important. This information is not intended to replace advice given to you by your health care provider. Make sure you discuss any questions you have with your health care provider. Document Released: 09/05/2002 Document Revised: 12/28/2017 Document Reviewed: 12/28/2017 Elsevier Patient Education  2020 [employer].

## 2019-04-05 NOTE — Progress Notes (Signed)
Rachel [name]  04/05/2019  HISTORY SECTION :  Chief Complaint  Patient presents with  . Knee Pain    left   58 years old comes in with history of severe left medial knee pain for 1 year associated with intermittent swelling catching locking giving way and pain over the medial joint line.  She denies any trauma.  She has had an injection she has had naproxen she is tried rest and activity modification she was referred here because she was not getting better and the knee was getting worse   Review of Systems  Gastrointestinal: Positive for blood in stool, heartburn and melena.  Musculoskeletal: Positive for joint pain.  Psychiatric/Behavioral: Positive for depression.  All other systems reviewed and are negative.    has a past medical history of Anemia, Anxiety, Arthritis, Depression, GERD (gastroesophageal reflux disease), Hyperlipidemia, and Hypertension.   Past Surgical History:  Procedure Laterality Date  . CESAREAN SECTION    . cesarian  1990  . COLONOSCOPY N/A 08/10/2016   Procedure: COLONOSCOPY;  Surgeon: Danie Binder, MD;  Location: [facility];  Service: Endoscopy;  Laterality: N/A;  10:30 AM  . fallopian tube removed N/A     Body mass index is 42.3 kg/m.   Allergies  Allergen Reactions  . Hydrocodone Nausea And Vomiting  . Oxycodone Nausea And Vomiting    Codeine, hydrocodone, most pain pills.  . Penicillins Itching and Nausea And Vomiting    Has patient had a PCN reaction causing immediate rash, facial/tongue/throat swelling, SOB or lightheadedness with hypotension: Yes, around the eyes Has patient had a PCN reaction causing severe rash involving mucus membranes or skin necrosis: No Has patient had a PCN reaction that required hospitalization: No Has patient had a PCN reaction occurring within the last 10 years: No  If all of the above answers are "NO", then may proceed with Cephalosporin use.   . Precedex [Dexmedetomidine Hcl In Nacl]     Increased  agitation/confusion in ICU [April 2018]  . Seroquel [Quetiapine]     More agitated/confused per daughter [name] when given in the past.     Current Outpatient Medications:  .  aspirin EC 81 MG EC tablet, Take 1 tablet (81 mg total) by mouth daily., Disp: , Rfl:  .  clonazePAM (KLONOPIN) 1 MG tablet, Take 1 mg by mouth 3 (three) times daily as needed for anxiety. , Disp: , Rfl:  .  famotidine (PEPCID) 20 MG tablet, TAKE 1 TABLET AT BEDTIME, Disp: 30 tablet, Rfl: 0 .  phentermine 37.5 MG capsule, daily., Disp: , Rfl:  .  telmisartan-hydrochlorothiazide (MICARDIS HCT) 80-25 MG tablet, Take 1 tablet by mouth daily., Disp: 30 tablet, Rfl: 5 .  ibuprofen (ADVIL) 800 MG tablet, Take 1 tablet (800 mg total) by mouth every 8 (eight) hours as needed., Disp: 90 tablet, Rfl: 1   PHYSICAL EXAM SECTION: 1) BP 134/87   Pulse 90   Ht 5' (1.524 m)   Wt 216 lb 9.6 oz (98.2 kg)   BMI 42.30 kg/m   Body mass index is 42.3 kg/m. General appearance: Well-developed well-nourished no gross deformities  2) Cardiovascular normal pulse and perfusion in the lower extremities normal color without edema  3) Neurologically deep tendon reflexes are equal and normal, no sensation loss or deficits no pathologic reflexes  4) Psychological: Awake alert and oriented x3 mood and affect normal  5) Skin no lacerations or ulcerations no nodularity no palpable masses, no erythema or nodularity  6) Musculoskeletal:  Right knee seems fine bends well looks well no effusion no tenderness alignment looks good ligaments are stable  Left knee we have a small joint effusion we can get it fully straight does not fully bend causes pain throughout the arc of motion test tenderness over the medial joint line and the medial Pez bursa with a positive McMurray sign with severe catching ligaments are otherwise stable   MEDICAL DECISION SECTION:  Encounter Diagnoses  Name Primary?  . Chronic pain of left knee Yes  . 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 Primary osteoarthritis of left knee     Imaging She had images of the left and right knee 1 year ago she has arthritis in both joints Primarily medial compartment there are secondary bone changes there is a slight tentative varus alignment in the left knee right knee appears to still be in valgus  New images were obtained see report but basically her arthritis has progressed she has a varus knee with severe arthritis medial compartment with secondary changes Plan:  (Rx., Inj., surg., Frx, MRI/CT, XR:2) Recommend MRI left knee Use ice for swelling Use over-the-counter Aleve or ibuprofen   3:00 PM Arther Abbott, MD  04/05/2019

## 2019-04-14 ENCOUNTER — Other Ambulatory Visit: Payer: Self-pay

## 2019-04-14 ENCOUNTER — Ambulatory Visit (HOSPITAL_COMMUNITY)
Admission: RE | Admit: 2019-04-14 | Discharge: 2019-04-14 | Disposition: A | Discharge status: Home / Self Care | Payer: BC Managed Care – PPO | Source: Ambulatory Visit | Attending: Orthopedic Surgery | Admitting: Orthopedic Surgery

## 2019-04-14 DIAGNOSIS — G8929 Other chronic pain: Secondary | ICD-10-CM

## 2019-04-14 DIAGNOSIS — M25562 Pain in left knee: Secondary | ICD-10-CM | POA: Diagnosis not present

## 2019-04-24 ENCOUNTER — Telehealth: Payer: Self-pay | Admitting: Orthopedic Surgery

## 2019-04-24 DIAGNOSIS — F329 Major depressive disorder, single episode, unspecified: Secondary | ICD-10-CM | POA: Diagnosis not present

## 2019-04-24 DIAGNOSIS — I10 Essential (primary) hypertension: Secondary | ICD-10-CM | POA: Diagnosis not present

## 2019-04-24 DIAGNOSIS — Z681 Body mass index (BMI) 19 or less, adult: Secondary | ICD-10-CM | POA: Diagnosis not present

## 2019-04-24 DIAGNOSIS — E119 Type 2 diabetes mellitus without complications: Secondary | ICD-10-CM | POA: Diagnosis not present

## 2019-04-24 DIAGNOSIS — J449 Chronic obstructive pulmonary disease, unspecified: Secondary | ICD-10-CM | POA: Diagnosis not present

## 2019-04-24 DIAGNOSIS — F330 Major depressive disorder, recurrent, mild: Secondary | ICD-10-CM | POA: Diagnosis not present

## 2019-04-24 DIAGNOSIS — J42 Unspecified chronic bronchitis: Secondary | ICD-10-CM | POA: Diagnosis not present

## 2019-04-24 DIAGNOSIS — E669 Obesity, unspecified: Secondary | ICD-10-CM | POA: Diagnosis not present

## 2019-04-24 NOTE — Telephone Encounter (Signed)
IMPRESSION: 1. Osteoarthritis of the medial compartment with full-thickness cartilage loss and subcortical edema in the medial femoral condyle and tibial plateau. 2. Moderate joint effusion with loose body in the joint. 3. 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   Electronically Signed   By: Lorriane Shire M.D.   On: 04/14/2019 12:54   Needs knee replacement and   Weight loss and preop medical eval

## 2019-04-26 ENCOUNTER — Telehealth: Payer: Self-pay | Admitting: Orthopedic Surgery

## 2019-04-26 NOTE — Telephone Encounter (Signed)
Needs knee replacement and   Weight loss and preop medical eval   I will call her.

## 2019-04-26 NOTE — Telephone Encounter (Signed)
Rachel [name] called and stated that Dr. Aline Brochure left a message with her MRI results.  She said he said something about getting with the PCP and about her losing some weight.  She wants to know if she needs to come in here or exactly what does she need to do next  Will you call her and go over the next steps please  Thanks

## 2019-04-26 NOTE — Telephone Encounter (Signed)
I spoke to hr and she states she has a physical scheduled for 05/03/2019 already and will discuss with her PCP

## 2019-05-03 DIAGNOSIS — Z6841 Body Mass Index (BMI) 40.0 and over, adult: Secondary | ICD-10-CM | POA: Diagnosis not present

## 2019-05-03 DIAGNOSIS — Z23 Encounter for immunization: Secondary | ICD-10-CM | POA: Diagnosis not present

## 2019-05-03 DIAGNOSIS — Z0000 Encounter for general adult medical examination without abnormal findings: Secondary | ICD-10-CM | POA: Diagnosis not present

## 2019-05-03 DIAGNOSIS — M7711 Lateral epicondylitis, right elbow: Secondary | ICD-10-CM | POA: Diagnosis not present

## 2019-05-03 DIAGNOSIS — M1712 Unilateral primary osteoarthritis, left knee: Secondary | ICD-10-CM | POA: Diagnosis not present

## 2019-05-10 ENCOUNTER — Telehealth: Payer: Self-pay | Admitting: Orthopedic Surgery

## 2019-05-10 NOTE — Telephone Encounter (Signed)
Rachel [name] called and stated that she saw her family doctor and that he said it was ok for her to have surgery.  I told her that Dr. Aline Brochure would really prefer to have something in writing from the [place] her last office note maybe.  She said she would have them send this to our office.    She then wanted to know if Dr. Aline Brochure could go ahead and schedule her surgery after we got those notes.  I told her that usually Dr. Aline Brochure has patients come back in to see him before surgery to go over all the details and answer any questions that she may have.  I told her that he would decide if she needed to do this.  I told her that we will give her a call once her PCP notes have been reviewed and  will let her know how to proceed.  She understood and will have PCP send notes

## 2019-05-30 ENCOUNTER — Encounter: Payer: Self-pay | Admitting: Orthopedic Surgery

## 2019-05-30 ENCOUNTER — Ambulatory Visit: Payer: BC Managed Care – PPO | Admitting: Orthopedic Surgery

## 2019-05-30 ENCOUNTER — Other Ambulatory Visit: Payer: Self-pay

## 2019-05-30 ENCOUNTER — Telehealth: Payer: Self-pay | Admitting: Radiology

## 2019-05-30 VITALS — BP 195/92 | HR 102 | Ht 60.0 in | Wt 213.0 lb

## 2019-05-30 DIAGNOSIS — G4733 Obstructive sleep apnea (adult) (pediatric): Secondary | ICD-10-CM | POA: Diagnosis not present

## 2019-05-30 DIAGNOSIS — Z6841 Body Mass Index (BMI) 40.0 and over, adult: Secondary | ICD-10-CM | POA: Diagnosis not present

## 2019-05-30 DIAGNOSIS — M1712 Unilateral primary osteoarthritis, left knee: Secondary | ICD-10-CM

## 2019-05-30 NOTE — Addendum Note (Signed)
Addended byCandice Camp on: 05/30/2019 03:30 PM   Modules accepted: Orders, SmartSet

## 2019-05-30 NOTE — Patient Instructions (Addendum)
You have decided to proceed with knee replacement surgery. You have decided not to continue with nonoperative measures such as but not limited to oral medication, weight loss, activity modification, physical therapy, bracing, or injection.  We will perform the procedure commonly known as total knee replacement. Some of the risks associated with knee replacement surgery include but are not limited to Bleeding Infection Swelling Stiffness Blood clot Pulmonary embolism  Loosening of the implant Pain that persists even after surgery  Infection is especially devastating complication of knee surgery although rare. If infection does occur your implant will usually have to be removed and several surgeries and antibiotics will be needed to eradicate the infection prior to performing a repeat replacement.   In some cases amputation is required to eradicate the infection. In other rare cases a knee fusion is needed   In compliance with recent [place] law in federal regulation regarding opioid use and abuse and addiction, we will taper (stop) opioid medication after 2 weeks.  If you're not comfortable with these risks and would like to continue with nonoperative treatment please let Dr. Aline Brochure know prior to your surgery.    Preparing for Knee Replacement Getting prepared before knee replacement surgery can make your recovery easier and more comfortable. This document provides some tips and guidelines that will help you prepare for your surgery. Talk with your health care provider so you can learn what to expect before, during, and after surgery. Ask questions if you do not understand something. To ease concerns about your financial responsibilities, call your insurance company as soon as you decide to have surgery. Ask how much of your surgery and hospital stay will be covered. Also ask about coverage for medical equipment, rehabilitation facilities, and home care. How should I arrange for  help? In the first couple weeks after surgery, it will likely be harder for you to do some of your regular activities. You may get tired easily, and you will have limited movement in your leg. Follow these guidelines to make sure you have all the help you need after your surgery:  Plan to have someone take you home from the hospital. Your health care provider will tell you how many days you can expect to be in the hospital.  Cancel all your work, caregiving, and volunteer responsibilities for at least 4-6 weeks after your surgery.  Plan to have someone stay with you day and night for the first week. This person should be someone you are comfortable with. You may need this person to help you with your exercises and personal care, such as bathing and using the toilet.  If you live alone, arrange for someone to take care of your home and pets for the first 4-6 weeks after surgery.  Arrange for drivers to take you to and from follow-up appointments, the grocery store, and other places you may need to go for at least 4-6 weeks.  Consider applying for a disabled parking permit. To get an application, contact the Department of Motor Vehicles or your health care provider's office. How should I prepare my home?      Pick a recovery spot, but do not plan on recovering in bed. Sitting upright is better for your health. You may want to use a recliner with a small table nearby. Place the items you use most frequently on that table. These may include the TV remote, a cordless phone, a cell phone, a book or laptop computer, and a water glass.  To  see if you will be able to move around in your home with a walker, hold your hands out about 6 inches (15 cm) from your sides, and walk from your recovery spot to your kitchen and bathroom. Then walk from your bed to the bathroom. If you do not hit anything with your hands, you will have enough room for a walker.  Minimize the use of stairs after you return home to  reduce your risk of falling or tripping.  Remove all clutter from your floors. Also remove any throw rugs. This will help you avoid tripping after your surgery.  Move the items you use most often in your kitchen, bathroom, and bedroom to shelves and drawers that are at countertop height.  Prepare a few meals to freeze and reheat later.  Consider getting safety equipment that will be helpful during your recovery, such as: ? Grab bars added in the shower and near the toilet. ? A raised toilet seat to help you get on and off the toilet more easily. ? A tub or shower bench. How should I prepare my body?  Have a preoperative exam. ? During the exam, your health care provider will make sure that your body is healthy enough to safely have the surgery. ? When you go to the exam, bring a complete list of all your medicines and supplements, including herbs and vitamins. ? You may need to have additional tests to ensure your safety.  Have elective dental care and routine cleanings done before your surgery. Germs from anywhere in your body, including your mouth, can travel to your new joint and infect it. It is important that you do not have any dental work done for at least 3 months after your surgery.  Maintain a healthy diet. Do not change your diet before surgery unless your health care provider tells you to do that.  Do not use any products that contain nicotine or tobacco, such as cigarettes and e-cigarettes. These can delay bone healing after surgery. If you need help quitting, ask your health care provider.  Tell your health care provider if: ? You develop any skin infections or skin irritations. You may need to improve the condition of your skin before surgery. ? You have a fever, a cold, or any other illness in the week before your surgery.  Do not drink any alcohol for at least 48 hours before surgery.  The day before your surgery, follow instructions from your health care provider  about showering, eating, drinking, and taking medicines. These directions are for your safety.  Talk to your health care provider about doing exercises before your surgery. ? Be sure to follow the exercise program only as directed by your health care provider. ? Doing these exercises in the weeks before your surgery may help reduce pain and improve function after surgery. Summary  Getting prepared before knee replacement surgery can make your recovery easier and more comfortable.  Prepare your home and arrange for help at home.  Keep all of your preoperative appointments to ensure that you are ready for your surgery.  Plan to have someone take you home from the hospital and stay with you day and night for the first week. This information is not intended to replace advice given to you by your health care provider. Make sure you discuss any questions you have with your health care provider. Document Released: 09/19/2010 Document Revised: 08/02/2017 Document Reviewed: 08/02/2017 Elsevier Patient Education  [employer].  Total Knee Replacement  Total knee replacement is a surgery to replace a bad knee joint with a man-made (prosthetic) joint. The man-made joint is called a prosthesis. This joint may be made of plastic, metal, or ceramic parts. It replaces parts of the thigh bone (femur), lower leg bone (tibia), and kneecap (patella). This is done to reduce pain and help you move better. Tell your doctor about:  Any allergies you have.  All medicines you are taking. This includes vitamins, herbs, eye drops, creams, and over-the-counter medicines.  Any problems you or family members have had with anesthetic medicines.  Any blood disorders you have.  Any surgeries you have had.  Any medical conditions you have.  Whether you are pregnant or may be pregnant. What are the risks? Generally, this is a safe procedure. However, problems may occur, such  as:  Infection.  Bleeding.  Blood clot.  Allergic reaction to medicines.  Damage to nerves and other parts of the knee.  Not being able to move your knee as much.  A feeling that the knee is weak or unstable.  Loosening of the new joint.  Pain that does not go away (chronic pain). What happens before the procedure? Staying hydrated Follow instructions from your doctor about hydration. These may include:  Up to 2 hours before the procedure - you may continue to drink clear liquids. These include water, clear fruit juice, black coffee, and plain tea.  Eating and drinking Follow instructions from your doctor about eating and drinking. These may include:  8 hours before the procedure - stop eating heavy meals or foods. These include meat, fried foods, or fatty foods.  6 hours before the procedure - stop eating light meals or foods. These include toast or cereal.  6 hours before the procedure - stop drinking milk or drinks that contain milk.  2 hours before the procedure - stop drinking clear liquids. Medicines Ask your doctor about:  Changing or stopping your normal medicines. This is important.  Taking aspirin and ibuprofen. Do not take these medicines unless your doctor tells you to take them.  Taking over-the-counter medicines, vitamins, herbs, and supplements. Tests and exams  You may have a physical exam.  You may have tests, such as: ? X-rays. ? MRI. ? CT scan. ? Bone scans.  You may have a blood or urine sample taken. Lifestyle   If your doctor tells you to do physical therapy, do exercises as told.  Keep your body and teeth clean. Germs from anywhere in your body can infect your new joint. Tell your doctor if: ? You plan to have dental care and routine cleanings. ? You get any skin infections.  If you are overweight, work with your doctor to reach a safe weight. Extra weight can affect your knee.  Do not use any products that contain nicotine or  tobacco, such as cigarettes, e-cigarettes, and chewing tobacco. These can delay healing. If you need help quitting, ask your doctor. General instructions  Plan to have someone take you home from the hospital or clinic.  If you will be going home right after the procedure, plan to have someone with you for at least 24 hours. It is best to have someone to help care for you for at least 4-6 weeks after surgery.  Do not shave your legs just before surgery. This will be done in the hospital if needed.  Ask your doctor: ? How your surgery site will be marked. ? What steps will be taken to help  prevent the spread of germs. These may include:  Removing hair at the surgery site.  Washing skin with a germ-killing soap. What happens during the procedure?  An IV tube will be put into one of your veins.  You will be given one or more of the following: ? A medicine to help you relax (sedative). ? A medicine to numb everything below the injection site (peripheral nerve block). ? A medicine that numbs your body below the waist (spinal anesthetic). ? A medicine to make you fall asleep (general anesthetic).  A cut (incision) will be made in your knee.  Damaged parts of your thigh bone, lower leg bone, and kneecap will be removed.  Parts of the new joint will be placed on your thigh bone, your lower leg bone, and the underside of your kneecap.  One or more small tubes (drains) may be placed near your cut. This will help to drain fluid.  Your cut will be closed. Your doctor will use stitches (sutures), skin glue, or skin tape (adhesive) strips.  A bandage (dressing) will be placed over your cut. The procedure may vary among doctors and hospitals. What happens after the procedure?  You will be monitored until you leave the hospital or clinic. Your blood pressure, heart rate, breathing rate, and blood oxygen level will be checked.  You will be given medicines for pain.  You may: ? Keep getting  fluids through an IV tube. ? Have fluid coming from the drain. ? Have to wear special socks (compression stockings). ? Be given a knee joint motion machine (continuous passive motion machine) to use at home.  You will be told to move around as much as you can.  Do not drive until your health care provider tells you it is okay. Summary  Total knee replacement is a surgery to replace a bad knee joint with a man-made joint.  Before the procedure, follow what you are told about eating, drinking, and taking medicines.  Plan to have someone take you home from the hospital or clinic. This information is not intended to replace advice given to you by your health care provider. Make sure you discuss any questions you have with your health care provider. Document Released: 09/07/2011 Document Revised: 01/27/2018 Document Reviewed: 01/27/2018 Elsevier Patient Education  2020 [employer].

## 2019-05-30 NOTE — Telephone Encounter (Signed)
Forgot to give patient order for walker, will call her and mail order.

## 2019-05-30 NOTE — Progress Notes (Addendum)
Rachel [name]  05/30/2019  Body mass index is 41.6 kg/m.   HISTORY SECTION :  Chief Complaint  Patient presents with  . Knee Pain    left wants to schedule total knee replacement    HPI The patient presents for evaluation of  (mild/moderate/severe/ ) severe pain, in the (right /left) left knee, for about 1 year or so, associated with medial pain swelling trouble walking exercising getting in and out of a chair.  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  She has no steps inside the house with 1 step to get in the house   Review of Systems  Gastrointestinal: Positive for heartburn.  All other systems reviewed and are negative.    has a past medical history of Anemia, Anxiety, Arthritis, Depression, GERD (gastroesophageal reflux disease), Hyperlipidemia, and Hypertension.   Past Surgical History:  Procedure Laterality Date  . CESAREAN SECTION    . cesarian  1990  . COLONOSCOPY N/A 08/10/2016   Procedure: COLONOSCOPY;  Surgeon: Danie Binder, MD;  Location: [facility];  Service: Endoscopy;  Laterality: N/A;  10:30 AM  . fallopian tube removed N/A     Body mass index is 41.6 kg/m.   Allergies  Allergen Reactions  . Hydrocodone Nausea And Vomiting  . Oxycodone Nausea And Vomiting    Codeine, hydrocodone, most pain pills.  . Penicillins Itching and Nausea And Vomiting    Has patient had a PCN reaction causing immediate rash, facial/tongue/throat swelling, SOB or lightheadedness with hypotension: Yes, around the eyes Has patient had a PCN reaction causing severe rash involving mucus membranes or skin necrosis: No Has patient had a PCN reaction that required hospitalization: No Has patient had a PCN reaction  occurring within the last 10 years: No  If all of the above answers are "NO", then may proceed with Cephalosporin use.   . Precedex [Dexmedetomidine Hcl In Nacl]     Increased agitation/confusion in ICU [April 2018]  . Seroquel [Quetiapine]     More agitated/confused per daughter [name] when given in the past.     Current Outpatient Medications:  .  aspirin EC 81 MG EC tablet, Take 1 tablet (81 mg total) by mouth daily., Disp: , Rfl:  .  clonazePAM (KLONOPIN) 1 MG tablet, Take 1 mg by mouth 3 (three) times daily as needed for anxiety. , Disp: , Rfl:  .  famotidine (PEPCID) 20 MG tablet, TAKE 1 TABLET AT BEDTIME, Disp: 30 tablet, Rfl: 0 .  ibuprofen (ADVIL) 800 MG tablet, Take 1 tablet (800 mg total) by mouth every 8 (eight) hours as needed., Disp: 90 tablet, Rfl: 1 .  phentermine 37.5 MG capsule, daily., Disp: , Rfl:  .  telmisartan-hydrochlorothiazide (MICARDIS HCT) 80-25 MG tablet, Take 1 tablet by mouth daily., Disp: 30 tablet, Rfl: 5   PHYSICAL EXAM SECTION: 1) BP (!) 195/92   Pulse (!) 102   Ht 5' (1.524 m)   Wt 213 lb (96.6 kg)   BMI 41.60 kg/m   Body mass index is 41.6 kg/m. General appearance: Well-developed well-nourished no gross deformities  2) Cardiovascular normal pulse and perfusion in the lower extremities normal color without edema  3) Neurologically deep tendon reflexes are equal and normal, no sensation  loss or deficits no pathologic reflexes  4) Psychological: Awake alert and oriented x3 mood and affect normal  5) Skin no lacerations or ulcerations no nodularity no palpable masses, no erythema or nodularity  6) Musculoskeletal:  Left knee Skin normal no erythema Alignment varus tenderness medial joint line medial pes tendons Flexion arc 0-1 15 Ligaments stable Muscle tone normal   The patient meets the AMA guidelines for Morbid (severe) obesity with a BMI > 40.0 and I have recommended weight loss.   MEDICAL DECISION SECTION:  Encounter Diagnoses   Name Primary?  . Primary osteoarthritis of left knee Yes  . Body mass index 40.0-44.9, adult ([place])   . Morbid obesity ([facility])     Imaging Plain films show moderate disease but MRI shows complete cartilage loss on the medial compartment with discoid lateral meniscus torn medial meniscus Multiple osteophytes increased bone edema medial tibial plateau   Plan:  (Rx., Inj., surg., Frx, MRI/CT, XR:2)  The procedure has been fully reviewed with the patient; The risks and benefits of surgery have been discussed and explained and understood. Alternative treatment has also been reviewed, questions were encouraged and answered. The postoperative plan is also been reviewed.  We are going to proceed with a left total knee replacement.  She has a very small increased risk of infection because of her BMI but she has done a good job with weight loss and she is just right at the cutoff point.  She will be out of work for at least 6 weeks  She has a 93 year old daughter at home as well as 2 daughters who works locally who can help out.  She has no steps inside the house 1-2 steps to get in the house.  She is going to be put on the 23-hour protocol with surgery scheduled for January 5   2:55 PM Arther Abbott, MD  05/30/2019

## 2019-05-31 ENCOUNTER — Telehealth: Payer: Self-pay | Admitting: Radiology

## 2019-05-31 DIAGNOSIS — M1712 Unilateral primary osteoarthritis, left knee: Secondary | ICD-10-CM

## 2019-05-31 NOTE — Telephone Encounter (Signed)
-----   Message from Carole Civil, MD sent at 05/30/2019  4:37 PM EST ----- Schedule outpatient  At [facility]  outpx jan 25th

## 2019-06-09 ENCOUNTER — Other Ambulatory Visit: Payer: Self-pay | Admitting: Orthopedic Surgery

## 2019-06-09 DIAGNOSIS — M25562 Pain in left knee: Secondary | ICD-10-CM

## 2019-06-09 DIAGNOSIS — G8929 Other chronic pain: Secondary | ICD-10-CM

## 2019-06-09 DIAGNOSIS — M1712 Unilateral primary osteoarthritis, left knee: Secondary | ICD-10-CM

## 2019-06-09 DIAGNOSIS — M23322 Other meniscus derangements, posterior horn of medial meniscus, left knee: Secondary | ICD-10-CM

## 2019-06-13 NOTE — Telephone Encounter (Signed)
Cancelled surgery for now, will RS to later date, will call her, she voiced understanding

## 2019-06-28 ENCOUNTER — Other Ambulatory Visit (HOSPITAL_COMMUNITY): Payer: BC Managed Care – PPO

## 2019-07-01 ENCOUNTER — Other Ambulatory Visit (HOSPITAL_COMMUNITY): Payer: BC Managed Care – PPO

## 2019-07-04 ENCOUNTER — Encounter (HOSPITAL_COMMUNITY): Admission: RE | Payer: Self-pay | Source: Home / Self Care

## 2019-07-04 ENCOUNTER — Ambulatory Visit (HOSPITAL_COMMUNITY)
Admission: RE | Admit: 2019-07-04 | Payer: BC Managed Care – PPO | Source: Home / Self Care | Admitting: Orthopedic Surgery

## 2019-07-04 SURGERY — ARTHROPLASTY, KNEE, TOTAL
Anesthesia: Choice | Site: Knee | Laterality: Left

## 2019-07-21 ENCOUNTER — Ambulatory Visit: Payer: 59 | Admitting: Orthopedic Surgery

## 2019-08-10 ENCOUNTER — Telehealth: Payer: Self-pay | Admitting: Radiology

## 2019-08-10 ENCOUNTER — Other Ambulatory Visit: Payer: Self-pay | Admitting: Orthopedic Surgery

## 2019-08-10 DIAGNOSIS — M1712 Unilateral primary osteoarthritis, left knee: Secondary | ICD-10-CM

## 2019-08-10 NOTE — Telephone Encounter (Signed)
I called patient we can reschedule the knee replacement surgery now.   She would like to proceed with March 2nd I have mailed order for walker and put in the orders told her to expect a call from [facility]

## 2019-08-16 ENCOUNTER — Other Ambulatory Visit: Payer: Self-pay | Admitting: Orthopedic Surgery

## 2019-08-16 DIAGNOSIS — M23322 Other meniscus derangements, posterior horn of medial meniscus, left knee: Secondary | ICD-10-CM

## 2019-08-16 DIAGNOSIS — M25562 Pain in left knee: Secondary | ICD-10-CM

## 2019-08-16 DIAGNOSIS — G8929 Other chronic pain: Secondary | ICD-10-CM

## 2019-08-16 DIAGNOSIS — M1712 Unilateral primary osteoarthritis, left knee: Secondary | ICD-10-CM

## 2019-08-16 NOTE — Telephone Encounter (Signed)
Refill request

## 2019-08-21 ENCOUNTER — Other Ambulatory Visit: Payer: Self-pay | Admitting: Orthopedic Surgery

## 2019-08-21 ENCOUNTER — Telehealth: Payer: Self-pay | Admitting: Orthopedic Surgery

## 2019-08-21 ENCOUNTER — Telehealth: Payer: Self-pay | Admitting: Radiology

## 2019-08-21 DIAGNOSIS — M1712 Unilateral primary osteoarthritis, left knee: Secondary | ICD-10-CM

## 2019-08-21 NOTE — Telephone Encounter (Signed)
Patient wants to know if you sent an order for her to get a walker?  She states she uses Rachel [name]   Please call her  Thanks

## 2019-08-21 NOTE — Telephone Encounter (Signed)
Yes ,I mailed the order to her she should get this soon, if not we can reprint from the chart  I also told her to bring by a copy of the card, she will bring this in tomorrow.

## 2019-08-21 NOTE — Telephone Encounter (Signed)
I need the card to get approval.

## 2019-08-21 NOTE — Telephone Encounter (Signed)
-----   Message from Uvaldo Bristle sent at 08/21/2019 11:37 AM EST ----- Regarding: Pre-Authorization Shannon Balthazar,  RE:  Krislynn L. Shave   [id number] - received call from [place] at [facility]  [phone] relaying that patient has new insurance effective 06/30/19: [employer]. States requires pre-authorization. States she has added the coverage in; aware we have not seen patient in office since December of 2020; therefore, no new card received. Please advise.

## 2019-08-21 NOTE — Telephone Encounter (Signed)
I have asked her to bring this in, she will bring tomorrow

## 2019-08-22 ENCOUNTER — Telehealth: Payer: Self-pay | Admitting: Radiology

## 2019-08-22 NOTE — Patient Instructions (Addendum)
Your procedure is scheduled on: 08/29/2019  Report to [facility] at    6:15 AM.  Call this number if you have problems the morning of surgery: [phone]   Remember:   Do not Eat or Drink after midnight         No Smoking the morning of surgery  :  Take these medicines the morning of surgery with A SIP OF WATER: Klonopin and pantoprazole   Do not wear jewelry, make-up or nail polish.  Do not wear lotions, powders, or perfumes. You may wear deodorant.  Do not shave 48 hours prior to surgery. Men may shave face and neck.  Do not bring valuables to the hospital.  Contacts, dentures or bridgework may not be worn into surgery.  Leave suitcase in the car. After surgery it may be brought to your room.  For patients admitted to the hospital, checkout time is 11:00 AM the day of discharge.   Patients discharged the day of surgery will not be allowed to drive home.    Special Instructions: Shower using CHG night before surgery and shower the day of surgery use CHG.  Use special wash - you have one bottle of CHG for all showers.  You should use approximately 1/2 of the bottle for each shower.  Total Knee Replacement  Total knee replacement is a surgery to replace a bad knee joint with a man-made (prosthetic) joint. The man-made joint is called a prosthesis. This joint may be made of plastic, metal, or ceramic parts. It replaces parts of the thigh bone (femur), lower leg bone (tibia), and kneecap (patella). This is done to reduce pain and help you move better. Tell your doctor about:  Any allergies you have.  All medicines you are taking. This includes vitamins, herbs, eye drops, creams, and over-the-counter medicines.  Any problems you or family members have had with anesthetic medicines.  Any blood disorders you have.  Any surgeries you have had.  Any medical conditions you have.  Whether you are pregnant or may be pregnant. What are the risks? Generally, this is a safe procedure.  However, problems may occur, such as:  Infection.  Bleeding.  Blood clot.  Allergic reaction to medicines.  Damage to nerves and other parts of the knee.  Not being able to move your knee as much.  A feeling that the knee is weak or unstable.  Loosening of the new joint.  Pain that does not go away (chronic pain). What happens before the procedure? Staying hydrated Follow instructions from your doctor about hydration. These may include:  Up to 2 hours before the procedure - you may continue to drink clear liquids. These include water, clear fruit juice, black coffee, and plain tea.  Eating and drinking Follow instructions from your doctor about eating and drinking. These may include:  8 hours before the procedure - stop eating heavy meals or foods. These include meat, fried foods, or fatty foods.  6 hours before the procedure - stop eating light meals or foods. These include toast or cereal.  6 hours before the procedure - stop drinking milk or drinks that contain milk.  2 hours before the procedure - stop drinking clear liquids. Medicines Ask your doctor about:  Changing or stopping your normal medicines. This is important.  Taking aspirin and ibuprofen. Do not take these medicines unless your doctor tells you to take them.  Taking over-the-counter medicines, vitamins, herbs, and supplements. Tests and exams  You may have a  physical exam.  You may have tests, such as: ? X-rays. ? MRI. ? CT scan. ? Bone scans.  You may have a blood or urine sample taken. Lifestyle   If your doctor tells you to do physical therapy, do exercises as told.  Keep your body and teeth clean. Germs from anywhere in your body can infect your new joint. Tell your doctor if: ? You plan to have dental care and routine cleanings. ? You get any skin infections.  If you are overweight, work with your doctor to reach a safe weight. Extra weight can affect your knee.  Do not use any  products that contain nicotine or tobacco, such as cigarettes, e-cigarettes, and chewing tobacco. These can delay healing. If you need help quitting, ask your doctor. General instructions  Plan to have someone take you home from the hospital or clinic.  If you will be going home right after the procedure, plan to have someone with you for at least 24 hours. It is best to have someone to help care for you for at least 4-6 weeks after surgery.  Do not shave your legs just before surgery. This will be done in the hospital if needed.  Ask your doctor: ? How your surgery site will be marked. ? What steps will be taken to help prevent the spread of germs. These may include:  Removing hair at the surgery site.  Washing skin with a germ-killing soap. What happens during the procedure?  An IV tube will be put into one of your veins.  You will be given one or more of the following: ? A medicine to help you relax (sedative). ? A medicine to numb everything below the injection site (peripheral nerve block). ? A medicine that numbs your body below the waist (spinal anesthetic). ? A medicine to make you fall asleep (general anesthetic).  A cut (incision) will be made in your knee.  Damaged parts of your thigh bone, lower leg bone, and kneecap will be removed.  Parts of the new joint will be placed on your thigh bone, your lower leg bone, and the underside of your kneecap.  One or more small tubes (drains) may be placed near your cut. This will help to drain fluid.  Your cut will be closed. Your doctor will use stitches (sutures), skin glue, or skin tape (adhesive) strips.  A bandage (dressing) will be placed over your cut. The procedure may vary among doctors and hospitals. What happens after the procedure?  You will be monitored until you leave the hospital or clinic. Your blood pressure, heart rate, breathing rate, and blood oxygen level will be checked.  You will be given medicines for  pain.  You may: ? Keep getting fluids through an IV tube. ? Have fluid coming from the drain. ? Have to wear special socks (compression stockings). ? Be given a knee joint motion machine (continuous passive motion machine) to use at home.  You will be told to move around as much as you can.  Do not drive until your health care provider tells you it is okay. Summary  Total knee replacement is a surgery to replace a bad knee joint with a man-made joint.  Before the procedure, follow what you are told about eating, drinking, and taking medicines.  Plan to have someone take you home from the hospital or clinic. This information is not intended to replace advice given to you by your health care provider. Make sure you discuss any  questions you have with your health care provider. Document Revised: 01/27/2018 Document Reviewed: 01/27/2018 Elsevier Patient Education  [place].  Total Knee Replacement, Care After This sheet gives you information about how to care for yourself after your procedure. Your doctor may also give you more specific instructions. If you have problems or questions, contact your doctor. What can I expect after the procedure? After the procedure, it is common to have:  Pain.  Swelling.  A small amount of blood coming from your cut from surgery (incision).  Clear fluid coming from your cut from surgery.  Limited movement of your knee. Follow these instructions at home: Medicines  Take over-the-counter and prescription medicines only as told by your doctor.  If you were prescribed a blood thinner (anticoagulant), take it as told by your doctor.  Ask your doctor if the medicine prescribed to you: ? Requires you to avoid driving or using heavy machinery. ? Can cause trouble pooping (constipation). You may need to take steps to prevent or treat trouble pooping:  Drink enough fluid to keep your pee (urine) pale yellow.  Take over-the-counter or  prescription medicines.  Eat foods that are high in fiber. These include beans, whole grains, and fresh fruits and vegetables.  Limit foods that are high in fat and sugar. These include fried or sweet foods. Bathing  Do not take baths, swim, or use a hot tub until your doctor approves. Ask your doctor if you may take showers. You may only be allowed to take sponge baths.  Keep your bandage (dressing) dry until your doctor says it can be taken off. Incision care and drain care   Follow instructions from your doctor about how to take care of your cut from surgery. Make sure you: ? Wash your hands with soap and water before and after you change your bandage. If you cannot use soap and water, use hand sanitizer. ? Change your bandage as told by your doctor. ? Leave stitches (sutures), skin glue, or skin tape (adhesive) strips in place. They may need to stay in place for 2 weeks or longer. If tape strips get loose and curl up, you may trim the loose edges. Do not remove tape strips completely unless your doctor says it is okay.  Check your cut from surgery and your drain site every day for signs of infection. Check for: ? More redness, swelling, or pain. ? More fluid or blood. ? Warmth. ? Pus or a bad smell.  If you have a drain, follow instructions from your doctor about caring for it. Managing pain, stiffness, and swelling      If told, put ice on your knee. ? Put ice in a plastic bag or use the icing device (cold flow pad or cryocuff) that you were given. Follow your doctor's directions about how to use the icing device. ? Place a towel between your skin and the bag or between your skin and the icing device. ? Leave the ice on for 20 minutes, 2-3 times per day.  If told, put heat on your knee before you exercise. Use the heat source that your doctor recommends, such as a moist heat pack or a heating pad. ? Place a towel between your skin and the heat source. ? Leave the heat on for  20-30 minutes. ? Remove the heat if your skin turns bright red. This is very important if you are unable to feel pain, heat, or cold. You may have a greater risk of  getting burned.  Move your toes often.  Raise (elevate) your knee above the level of your heart while you are sitting or lying down. ? Use several pillows to keep your leg straight. ? Do not put a pillow just under the knee. If the knee is bent for a long time, this may make the knee stiff.  Wear elastic knee support as told by your doctor. Activity  Rest as told by your doctor.  Do not sit for a long time without moving. Get up to take short walks every 1-2 hours. This is important. Ask for help if you feel weak or unsteady.  Ask your doctor what activities are safe for you.  Avoid activities that put stress on your knees. These include running, jumping rope, and jumping jacks.  Do not play contact sports until your doctor says it is okay.  Do exercises as told by your physical therapist.  If you have been sent home with a knee joint motion machine (continuous passive motion machine), use it as told by your doctor. Safety   Do not use your leg to support your body weight until your doctor says that you can. Use crutches or a walker as told by your doctor.  Do not drive until your doctor says it is okay. Ask your doctor when it is safe to drive. General instructions  Do not use any products that contain nicotine or tobacco, such as cigarettes, e-cigarettes, and chewing tobacco. These can delay healing. If you need help quitting, ask your doctor.  Wear special socks (compression stockings) as told by your doctor.  Tell your doctor if you plan to have dental work. Also: ? Tell your dentist about your joint replacement. ? Ask your doctor if there are instructions you need to follow before dental care and routine cleanings.  Keep all follow-up visits as told by your doctor. This is important. Contact a doctor  if:  You have more redness, swelling, or pain around your cut from surgery or your drain.  You have more fluid or blood coming from your cut from surgery or your drain.  You have pus or a bad smell coming from your cut from surgery or your drain.  Your cut from surgery or your drain area feels warm to the touch.  You have a fever.  Your cut breaks open.  You have knee pain that does not go away.  The movement of your knee is getting worse.  Your new joint feels loose. Get help right away if you have:  Pain in your calf or thigh.  Swelling in your calf or thigh.  Shortness of breath.  Trouble breathing.  Chest pain. Summary  After the procedure, it is common to have pain and swelling, blood or fluid coming from your cut from surgery, and trouble moving your knee.  Follow instructions from your doctor about how to take care of your cut from surgery.  Use crutches or a walker as told by your doctor.  If you were prescribed a blood thinner, take it as told by your doctor.  Keep all follow-up visits as told by your doctor. This is important.   This information is not intended to replace advice given to you by your health care provider. Make sure you discuss any questions you have with your health care provider. Document Revised: 10/24/2018 Document Reviewed: 01/27/2018 Elsevier Patient Education  2020 [place] Anesthesia, Adult, Care After This sheet gives you information about how to  care for yourself after your procedure. Your health care provider may also give you more specific instructions. If you have problems or questions, contact your health care provider. What can I expect after the procedure? After the procedure, the following side effects are common:  Pain or discomfort at the IV site.  Nausea.  Vomiting.  Sore throat.  Trouble concentrating.  Feeling cold or chills.  Weak or tired.  Sleepiness and fatigue.  Soreness and body  aches. These side effects can affect parts of the body that were not involved in surgery. Follow these instructions at home:  For at least 24 hours after the procedure:  Have a responsible adult stay with you. It is important to have someone help care for you until you are awake and alert.  Rest as needed.  Do not: ? Participate in activities in which you could fall or become injured. ? Drive. ? Use heavy machinery. ? Drink alcohol. ? Take sleeping pills or medicines that cause drowsiness. ? Make important decisions or sign legal documents. ? Take care of children on your own. Eating and drinking  Follow any instructions from your health care provider about eating or drinking restrictions.  When you feel hungry, start by eating small amounts of foods that are soft and easy to digest (bland), such as toast. Gradually return to your regular diet.  Drink enough fluid to keep your urine pale yellow.  If you vomit, rehydrate by drinking water, juice, or clear broth. General instructions  If you have sleep apnea, surgery and certain medicines can increase your risk for breathing problems. Follow instructions from your health care provider about wearing your sleep device: ? Anytime you are sleeping, including during daytime naps. ? While taking prescription pain medicines, sleeping medicines, or medicines that make you drowsy.  Return to your normal activities as told by your health care provider. Ask your health care provider what activities are safe for you.  Take over-the-counter and prescription medicines only as told by your health care provider.  If you smoke, do not smoke without supervision.  Keep all follow-up visits as told by your health care provider. This is important. Contact a health care provider if:  You have nausea or vomiting that does not get better with medicine.  You cannot eat or drink without vomiting.  You have pain that does not get better with  medicine.  You are unable to pass urine.  You develop a skin rash.  You have a fever.  You have redness around your IV site that gets worse. Get help right away if:  You have difficulty breathing.  You have chest pain.  You have blood in your urine or stool, or you vomit blood. Summary  After the procedure, it is common to have a sore throat or nausea. It is also common to feel tired.  Have a responsible adult stay with you for the first 24 hours after general anesthesia. It is important to have someone help care for you until you are awake and alert.  When you feel hungry, start by eating small amounts of foods that are soft and easy to digest (bland), such as toast. Gradually return to your regular diet.  Drink enough fluid to keep your urine pale yellow.  Return to your normal activities as told by your health care provider. Ask your health care provider what activities are safe for you. This information is not intended to replace advice given to you by your health care provider.  Make sure you discuss any questions you have with your health care provider. Document Revised: 06/18/2017 Document Reviewed: 01/29/2017 Elsevier Patient Education  [place].  How to Use an Incentive Spirometer An incentive spirometer is a tool that measures how well you are filling your lungs with each breath. Learning to take long, deep breaths using this tool can help you keep your lungs clear and active. This may help to reverse or lessen your chance of developing breathing (pulmonary) problems, especially infection. You may be asked to use a spirometer:  After a surgery.  If you have a lung problem or a history of smoking.  After a long period of time when you have been unable to move or be active. If the spirometer includes an indicator to show the highest number that you have reached, your health care provider or respiratory therapist will help you set a goal. Keep a list (log) of  your progress as told by your health care provider. What are the risks?  Breathing too quickly may cause dizziness or cause you to pass out. Take your time so you do not get dizzy or light-headed.  If you are in pain, you may need to take pain medicine before doing incentive spirometry. It is harder to take a deep breath if you are having pain. How to use your incentive spirometer  1. Sit up on the edge of your bed or on a chair. 2. Hold the incentive spirometer so that it is in an upright position. 3. Before you use the spirometer, breathe out normally. 4. Place the mouthpiece in your mouth. Make sure your lips are closed tightly around it. 5. Breathe in slowly and as deeply as you can through your mouth, causing the piston or the ball to rise toward the top of the chamber. 6. Hold your breath for 3-5 seconds, or for as long as possible. ? If the spirometer includes a coach indicator, use this to guide you in breathing. Slow down your breathing if the indicator goes above the marked areas. 7. Remove the mouthpiece from your mouth and breathe out normally. The piston or ball will return to the bottom of the chamber. 8. Rest for a few seconds, then repeat the steps 10 or more times. ? Take your time and take a few normal breaths between deep breaths so that you do not get dizzy or light-headed. ? Do this every 1-2 hours when you are awake. 9. If the spirometer includes a goal marker to show the highest number you have reached (best effort), use this as a goal to work toward during each repetition. 10. After each set of 10 deep breaths, cough a few times. This will help to make sure that your lungs are clear. ? If you have an incision on your chest or abdomen from surgery, place a pillow or a rolled-up towel firmly against the incision when you cough. This can help to reduce pain from coughing. General tips  When you become able to get out of bed, walk around often and continue to cough to help  clear your lungs.  Keep using the incentive spirometer until your health care provider says it is okay to stop using it. If you have been in the hospital, you may be told to keep using the spirometer at home. Contact a health care provider if:  You are having difficulty using the spirometer.  You have trouble using the spirometer as often as instructed.  Your pain medicine is  not giving enough relief for you to use the spirometer as told.  You have a fever.  You develop shortness of breath. Get help right away if:  You develop a cough with bloody mucus from the lungs (bloody sputum).  You have fluid or blood coming from an incision site after you cough. Summary  An incentive spirometer is a tool that can help you learn to take long, deep breaths to keep your lungs clear and active.  You may be asked to use a spirometer after a surgery, if you have a lung problem or a history of smoking, or if you have been inactive for a long period of time.  Use your incentive spirometer as instructed every 1-2 hours while you are awake.  If you have an incision on your chest or abdomen, place a pillow or a rolled-up towel firmly against your incision when you cough. This will help to reduce pain. This information is not intended to replace advice given to you by your health care provider. Make sure you discuss any questions you have with your health care provider. Document Revised: 01/13/2019 Document Reviewed: 04/28/2017 Elsevier Patient Education  2020 [employer].

## 2019-08-22 NOTE — Telephone Encounter (Signed)
If patient comes by with her insurance card, I have started her prior authorization forms, have given to [place].

## 2019-08-22 NOTE — Telephone Encounter (Signed)
If she brings in the card, will you please make sure Rachel [name] gets it?I ave started her prior authorization forms, have given to [place].

## 2019-08-23 NOTE — Telephone Encounter (Signed)
Noted. I called back to patient - states she did come by with her card yesterday, however, it was during lunch while office was closed. Rachel [name] will come by this afternoon after 1:30pm

## 2019-08-23 NOTE — Telephone Encounter (Signed)
Insurance card received and scanned; Abigail Butts, Science writer, notified.

## 2019-08-24 NOTE — Telephone Encounter (Signed)
I faxed x 2 yesterday, confirmed received.  I have s/w Rachel [name], she is aware they are working on this and that we anticipate it to be approved.  I called this AM to check status and it is pended- being reviewed.  Will call again to check on this and will call Rachel [name] once done.  They should fax the auth to [place] as well.

## 2019-08-24 NOTE — Telephone Encounter (Signed)
Sending to you to complete documentation.  If not approved by tomorrow, please send back to me, thanks-

## 2019-08-25 ENCOUNTER — Encounter (HOSPITAL_COMMUNITY): Payer: Self-pay

## 2019-08-25 ENCOUNTER — Other Ambulatory Visit: Payer: Self-pay

## 2019-08-25 ENCOUNTER — Encounter (HOSPITAL_COMMUNITY)
Admission: RE | Admit: 2019-08-25 | Discharge: 2019-08-25 | Disposition: A | Discharge status: Home / Self Care | Payer: 59 | Source: Ambulatory Visit | Attending: Orthopedic Surgery | Admitting: Orthopedic Surgery

## 2019-08-25 DIAGNOSIS — Z01812 Encounter for preprocedural laboratory examination: Secondary | ICD-10-CM | POA: Diagnosis not present

## 2019-08-25 HISTORY — DX: Sleep apnea, unspecified: G47.30

## 2019-08-25 LAB — BASIC METABOLIC PANEL
Anion gap: 12 (ref 5–15)
BUN: 20 mg/dL (ref 6–20)
CO2: 25 mmol/L (ref 22–32)
Calcium: 9.3 mg/dL (ref 8.9–10.3)
Chloride: 102 mmol/L (ref 98–111)
Creatinine, Ser: 0.64 mg/dL (ref 0.44–1.00)
GFR calc Af Amer: >60 mL/min (ref >60)
GFR calc non Af Amer: >60 mL/min (ref >60)
Glucose, Bld: 106 mg/dL — ABNORMAL HIGH (ref 70–99)
Potassium: 4.1 mmol/L (ref 3.5–5.1)
Sodium: 139 mmol/L (ref 135–145)

## 2019-08-25 LAB — PREPARE RBC (CROSSMATCH)

## 2019-08-25 LAB — ABO/RH: ABO/RH(D): O POS

## 2019-08-25 LAB — SURGICAL PCR SCREEN
MRSA, PCR: NEGATIVE
Staphylococcus aureus: NEGATIVE

## 2019-08-25 NOTE — Telephone Encounter (Signed)
Case still pended, submitted again with expedited status. Spoke with Danika A. Ref# 08/25/19 @ [id number]

## 2019-08-28 ENCOUNTER — Other Ambulatory Visit (HOSPITAL_COMMUNITY)
Admission: RE | Admit: 2019-08-28 | Discharge: 2019-08-28 | Disposition: A | Discharge status: Home / Self Care | Payer: 59 | Source: Ambulatory Visit | Attending: Orthopedic Surgery | Admitting: Orthopedic Surgery

## 2019-08-28 ENCOUNTER — Encounter (HOSPITAL_COMMUNITY): Payer: Self-pay | Admitting: Anesthesiology

## 2019-08-28 ENCOUNTER — Telehealth: Payer: Self-pay | Admitting: Radiology

## 2019-08-28 ENCOUNTER — Other Ambulatory Visit: Payer: Self-pay

## 2019-08-28 DIAGNOSIS — Z01812 Encounter for preprocedural laboratory examination: Secondary | ICD-10-CM | POA: Insufficient documentation

## 2019-08-28 DIAGNOSIS — Z20822 Contact with and (suspected) exposure to covid-19: Secondary | ICD-10-CM | POA: Insufficient documentation

## 2019-08-28 LAB — SARS CORONAVIRUS 2 (TAT 6-24 HRS): SARS Coronavirus 2: NEGATIVE

## 2019-08-28 NOTE — Telephone Encounter (Addendum)
[facility] is not approving surgery for patient I called her to advise Rachel [name] has worked on this all day, and last week also It is NOT approved states Dr Aline Brochure out of network,which is confusing, since he is in network, regardless, case will have to be RS until we have approval  To you FYI

## 2019-08-28 NOTE — Telephone Encounter (Signed)
We were advised this is denied, Amy has since s/w patient who has informed her that [facility] says that was a mistake and they have been trying to reach [place].  Not sure what is going on.  No messages from them thus far, and I have s/w them 6 times today already.  Surgery has been cancelled for now.  Amy advised necessary staff.

## 2019-08-28 NOTE — Telephone Encounter (Signed)
Still working on this one, I have reached out to advise [employer], will call [place] again, last call it is in Medical Review now.

## 2019-08-28 NOTE — H&P (Signed)
Body mass index is 41.6 kg/m.    HISTORY SECTION :       Chief Complaint  Patient presents with  . Knee Pain      Left knee pain    HPI The patient presents for evaluation of  (mild/moderate/severe/ ) severe pain, in the (right /left) left knee, for about 1 year or so, associated with medial pain swelling trouble walking exercising getting in and out of a chair.   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  She has no steps inside the house with 1 step to get in the house     Review of Systems  Gastrointestinal: Positive for heartburn.  All other systems reviewed and are negative.      has a past medical history of Anemia, Anxiety, Arthritis, Depression, GERD (gastroesophageal reflux disease), Hyperlipidemia, and Hypertension.         Past Surgical History:  Procedure Laterality Date  . CESAREAN SECTION      . cesarian   1990  . COLONOSCOPY N/A 08/10/2016    Procedure: COLONOSCOPY;  Surgeon: Danie Binder, MD;  Location: [facility];  Service: Endoscopy;  Laterality: N/A;  10:30 AM  . fallopian tube removed N/A        Body mass index is 41.6 kg/m.          Allergies  Allergen Reactions  . Hydrocodone Nausea And Vomiting  . Oxycodone Nausea And Vomiting      Codeine, hydrocodone, most pain pills.  . Penicillins Itching and Nausea And Vomiting      Has patient had a PCN reaction causing immediate rash, facial/tongue/throat swelling, SOB or lightheadedness with hypotension: Yes, around the eyes Has patient had a PCN reaction causing severe rash involving mucus membranes or skin necrosis: No Has patient had a PCN reaction that required hospitalization: No Has patient had a PCN reaction occurring within the  last 10 years: No  If all of the above answers are "NO", then may proceed with Cephalosporin use.    . Precedex [Dexmedetomidine Hcl In Nacl]        Increased agitation/confusion in ICU [April 2018]  . Seroquel [Quetiapine]        More agitated/confused per daughter [name] when given in the past.        Current Outpatient Medications:  .  aspirin EC 81 MG EC tablet, Take 1 tablet (81 mg total) by mouth daily., Disp: , Rfl:  .  clonazePAM (KLONOPIN) 1 MG tablet, Take 1 mg by mouth 3 (three) times daily as needed for anxiety. , Disp: , Rfl:  .  famotidine (PEPCID) 20 MG tablet, TAKE 1 TABLET AT BEDTIME, Disp: 30 tablet, Rfl: 0 .  ibuprofen (ADVIL) 800 MG tablet, Take 1 tablet (800 mg total) by mouth every 8 (eight) hours as needed., Disp: 90 tablet, Rfl: 1 .  phentermine 37.5 MG capsule, daily., Disp: , Rfl:  .  telmisartan-hydrochlorothiazide (MICARDIS HCT) 80-25 MG tablet, Take 1 tablet by mouth daily., Disp: 30 tablet, Rfl: 5     PHYSICAL EXAM SECTION: 1) BP (!) 195/92   Pulse (!) 102   Ht 5' (1.524 m)   Wt 213 lb (96.6 kg)   BMI 41.60 kg/m  Body mass index is 41.6 kg/m. General appearance: Well-developed well-nourished no gross deformities  2) Cardiovascular normal pulse and perfusion in the lower extremities normal color without edema  3) Neurologically deep tendon reflexes are equal and normal, no sensation loss or deficits no pathologic reflexes   4) Psychological: Awake alert and oriented x3 mood and affect normal   5) Skin no lacerations or ulcerations no nodularity no palpable masses, no erythema or nodularity   6) Musculoskeletal:  Left knee Skin normal no erythema Alignment varus tenderness medial joint line medial pes tendons Flexion arc 0-1 15 Ligaments stable Muscle tone normal     The patient meets the AMA guidelines for Morbid (severe) obesity with a BMI > 40.0 and I have recommended weight loss.     MEDICAL DECISION SECTION:      Encounter Diagnoses   Name Primary?  . Primary osteoarthritis of left knee Yes  . Body mass index 40.0-44.9, adult ([place])    . Morbid obesity ([facility])        Imaging Plain films show moderate disease but MRI shows complete cartilage loss on the medial compartment with discoid lateral meniscus torn medial meniscus Multiple osteophytes increased bone edema medial tibial plateau.  She has 5 degrees of varus with medial compartment loss of joint space.     Plan:  (Rx., Inj., surg., Frx, MRI/CT, XR:2)  LEFT TOTAL KNEE   The procedure has been fully reviewed with the patient; The risks and benefits of surgery have been discussed and explained and understood. Alternative treatment has also been reviewed, questions were encouraged and answered. The postoperative plan is also been reviewed.   We are going to proceed with a left total knee replacement.   She has a very small increased risk of infection because of her BMI but she has done a good job with weight loss and she is just right at the cutoff point.   She will be out of work for at least 6 weeks   She has a 27 year old daughter at home as well as 2 daughters who works locally who can help out.   She has no steps inside the house 1-2 steps to get in the house.

## 2019-08-29 ENCOUNTER — Ambulatory Visit (HOSPITAL_COMMUNITY): Admission: RE | Admit: 2019-08-29 | Payer: 59 | Source: Home / Self Care | Admitting: Orthopedic Surgery

## 2019-08-29 ENCOUNTER — Telehealth: Payer: Self-pay | Admitting: Orthopedic Surgery

## 2019-08-29 ENCOUNTER — Encounter (HOSPITAL_COMMUNITY): Admission: RE | Payer: Self-pay | Source: Home / Self Care

## 2019-08-29 SURGERY — ARTHROPLASTY, KNEE, TOTAL
Anesthesia: Choice | Site: Knee | Laterality: Left

## 2019-08-29 MED ORDER — LIDOCAINE HCL (PF) 1 % IJ SOLN
INTRAMUSCULAR | Status: AC
  Filled 2019-08-29: qty 30

## 2019-08-29 MED ORDER — ROPIVACAINE HCL 5 MG/ML IJ SOLN
INTRAMUSCULAR | Status: AC
  Filled 2019-08-29: qty 30

## 2019-08-29 MED ORDER — DEXAMETHASONE SODIUM PHOSPHATE 4 MG/ML IJ SOLN
INTRAMUSCULAR | Status: AC
  Filled 2019-08-29: qty 2

## 2019-08-29 NOTE — Telephone Encounter (Signed)
I spoke to her today  I made appointment for her She would like to try injection perhaps until insurance considers approving surgery.  To you FYI

## 2019-08-29 NOTE — Telephone Encounter (Signed)
I have called patient to advise.  

## 2019-08-29 NOTE — Telephone Encounter (Signed)
Yes, I have refaxed the information and have advised patient is a non smoker

## 2019-08-29 NOTE — Telephone Encounter (Signed)
It does look like the denial is not b/c Dr Aline Brochure is out of network, her BMI has to be below 35. I will call her today.

## 2019-08-29 NOTE — Telephone Encounter (Signed)
Rachel [name] called and stated that she spoke to [employer].  She said they were under the impression that she was a smoker and she has not smoked in 3 years.  She wants to speak to you about resubmitting the request for authorization for surgery.  Please call her at [phone]  Thanks

## 2019-08-31 ENCOUNTER — Telehealth: Payer: Self-pay | Admitting: Radiology

## 2019-08-31 NOTE — Telephone Encounter (Signed)
Wendelyn Breslow called to tell us that the request for surg is denied, [id number], within 5 days we should receive a letter.   Called ID said [phone] [id number].

## 2019-09-05 ENCOUNTER — Telehealth: Payer: Self-pay | Admitting: Orthopedic Surgery

## 2019-09-05 LAB — BPAM RBC
Blood Product Expiration Date: 202103312359
Blood Product Expiration Date: 202103312359
Unit Type and Rh: 5100
Unit Type and Rh: 5100

## 2019-09-05 LAB — TYPE AND SCREEN
ABO/RH(D): O POS
Antibody Screen: NEGATIVE
Unit division: 0
Unit division: 0

## 2019-09-05 NOTE — Telephone Encounter (Signed)
Left message for her to call me back. 

## 2019-09-05 NOTE — Telephone Encounter (Signed)
Rachel [name] called and left a message asking that you call her please  Thanks

## 2019-09-07 NOTE — Telephone Encounter (Signed)
She has not called back, but she is coming in on Monday.

## 2019-09-11 ENCOUNTER — Ambulatory Visit (INDEPENDENT_AMBULATORY_CARE_PROVIDER_SITE_OTHER): Payer: 59 | Admitting: Orthopedic Surgery

## 2019-09-11 ENCOUNTER — Other Ambulatory Visit: Payer: Self-pay

## 2019-09-11 ENCOUNTER — Encounter: Payer: Self-pay | Admitting: Orthopedic Surgery

## 2019-09-11 VITALS — BP 146/86 | HR 107 | Ht 60.0 in | Wt 213.0 lb

## 2019-09-11 DIAGNOSIS — M1712 Unilateral primary osteoarthritis, left knee: Secondary | ICD-10-CM

## 2019-09-11 NOTE — Progress Notes (Signed)
Chief Complaint  Patient presents with  . Knee Pain    left/ insurance company has denied the Total knee replacment    BP (!) 146/86   Pulse (!) 107   Ht 5' (1.524 m)   Wt 213 lb (96.6 kg)   BMI 41.60 kg/m   Needs to weigh 203   Lots of allergies  Allergies  Allergen Reactions  . Hydrocodone Nausea And Vomiting  . Oxycodone Nausea And Vomiting    Codeine, hydrocodone, most pain pills.  . Penicillins Itching and Nausea And Vomiting    Has patient had a PCN reaction causing immediate rash, facial/tongue/throat swelling, SOB or lightheadedness with hypotension: Yes, around the eyes Has patient had a PCN reaction causing severe rash involving mucus membranes or skin necrosis: No Has patient had a PCN reaction that required hospitalization: No Has patient had a PCN reaction occurring within the last 10 years: No  If all of the above answers are "NO", then may proceed with Cephalosporin use.   . Precedex [Dexmedetomidine Hcl In Nacl]     Increased agitation/confusion in ICU [April 2018]  . Seroquel [Quetiapine]     More agitated/confused per daughter [name] when given in the past.    Trying injection for pain   Continue weight loss   Return when weight 203   Procedure note left knee injection   verbal consent was obtained to inject left knee joint  Timeout was completed to confirm the site of injection  The medications used were 40 mg of Depo-Medrol and 1% lidocaine 3 cc  Anesthesia was provided by ethyl chloride and the skin was prepped with alcohol.  After cleaning the skin with alcohol a 20-gauge needle was used to inject the left knee joint. There were no complications. A sterile bandage was applied.

## 2019-09-11 NOTE — Patient Instructions (Signed)
Continue weight loss   Return when weight 203

## 2020-02-06 NOTE — H&P (Signed)
Surgical History & Physical  Patient Name: Rachel [name] DOB: 1961/06/13  Surgery: Cataract extraction with intraocular lens implant phacoemulsification; Right Eye  Surgeon: Baruch Goldmann MD Surgery Date:  02/19/2020 Pre-Op Date:  01/29/2020  HPI: A 53 Yr. old female patient 1. 1. The patient complains of difficulty when reading fine print, books, newspaper, instructions etc., which began 1 year ago. Both eyes are affected. The episode is gradual. The condition's severity increased since last visit. Symptoms occur when the patient is driving, inside and reading. The complaint is associated with glare while driving at night. This is negatively affecting the patient's quality of life. HPI was performed by Baruch Goldmann .  Medical History: Cataracts GERD, Sleep apenia, Anxiety High Blood Pressure  Review of Systems Negative Allergic/Immunologic Negative Cardiovascular Negative Constitutional Negative Ear, Nose, Mouth & Throat Negative Endocrine Negative Gastrointestinal Negative Genitourinary Negative Hemotologic/Lymphatic Negative Integumentary Negative Musculoskeletal Negative Neurological Negative Psychiatry Negative Respiratory  Social   Former smoker   Medication Clonazepam, Telmosartan, Phentremen( for weight loss), Ibuprofen,   Sx/Procedures Ocular Other C Section, Carpal Tunnel,   Drug Allergies  Hydrocodine, Peniciilin, Amoxicillin,   History & Physical: Heent:  Cataract. Right eye NECK: supple without bruits LUNGS: lungs clear to auscultation CV: regular rate and rhythm Abdomen: soft and non-tender  Impression & Plan: Assessment: 1.  COMBINED FORMS AGE RELATED CATARACT; Both Eyes (H25.813)  Plan: 1.  Cataract accounts for the patient's decreased vision. This visual impairment is not correctable with a tolerable change in glasses or contact lenses. Cataract surgery with an implantation of a new lens should significantly improve the visual and  functional status of the patient. Discussed all risks, benefits, alternatives, and potential complications. Discussed the procedures and recovery. Patient desires to have surgery. A-scan ordered and performed today for intra-ocular lens calculations. The surgery will be performed in order to improve vision for driving, reading, and for eye examinations. Recommend phacoemulsification with intra-ocular lens. Right Eye worse and non-dominant - first. Dilates poorly - shugacaine by protocol. Malyugin Ring in room.

## 2020-02-15 NOTE — Patient Instructions (Signed)
Rachel [name]  02/15/2020     @PREFPERIOPPHARMACY @   Your procedure is scheduled on  02/19/2020  Report to [facility] at  0700  A.M.  Call this number if you have problems the morning of surgery:  [phone]   Remember:  Do not eat or drink after midnight.                      Take these medicines the morning of surgery with A SIP OF WATER  clonazepam, protonix. STOP your phentermine until after your procedure.    Do not wear jewelry, make-up or nail polish.  Do not wear lotions, powders, or perfumes. Please wear deodorant and brush your teeth.  Do not shave 48 hours prior to surgery.  Men may shave face and neck.  Do not bring valuables to the hospital.  [facility] is not responsible for any belongings or valuables.  Contacts, dentures or bridgework may not be worn into surgery.  Leave your suitcase in the car.  After surgery it may be brought to your room.  For patients admitted to the hospital, discharge time will be determined by your treatment team.  Patients discharged the day of surgery will not be allowed to drive home.   Name and phone number of your driver:   family Special instructions:   DO NOT smoke the morning of your procedure.  Please read over the following fact sheets that you were given. Anesthesia Post-op Instructions and Care and Recovery After Surgery      Cataract Surgery, Care After This sheet gives you information about how to care for yourself after your procedure. Your health care provider may also give you more specific instructions. If you have problems or questions, contact your health care provider. What can I expect after the procedure? After the procedure, it is common to have:  Itching.  Discomfort.  Fluid discharge.  Sensitivity to light and to touch.  Bruising in or around the eye.  Mild blurred vision. Follow these instructions at home: Eye care   Do not touch or rub your eyes.  Protect your eyes as told by  your health care provider. You may be told to wear a protective eye shield or sunglasses.  Do not put a contact lens into the affected eye or eyes until your health care provider approves.  Keep the area around your eye clean and dry: ? Avoid swimming. ? Do not allow water to hit you directly in the face while showering. ? Keep soap and shampoo out of your eyes.  Check your eye every day for signs of infection. Watch for: ? Redness, swelling, or pain. ? Fluid, blood, or pus. ? Warmth. ? A bad smell. ? Vision that is getting worse. ? Sensitivity that is getting worse. Activity  Do not drive for 24 hours if you were given a sedative during your procedure.  Avoid strenuous activities, such as playing contact sports, for as long as told by your health care provider.  Do not drive or use heavy machinery until your health care provider approves.  Do not bend or lift heavy objects. Bending increases pressure in the eye. You can walk, climb stairs, and do light household chores.  Ask your health care provider when you can return to work. If you work in a dusty environment, you may be advised to wear protective eyewear for a period of time. General instructions  Take or apply over-the-counter and prescription  medicines only as told by your health care provider. This includes eye drops.  Keep all follow-up visits as told by your health care provider. This is important. Contact a health care provider if:  You have increased bruising around your eye.  You have pain that is not helped with medicine.  You have a fever.  You have redness, swelling, or pain in your eye.  You have fluid, blood, or pus coming from your incision.  Your vision gets worse.  Your sensitivity to light gets worse. Get help right away if:  You have sudden loss of vision.  You see flashes of light or spots (floaters).  You have severe eye pain.  You develop nausea or vomiting. Summary  After your  procedure, it is common to have itching, discomfort, bruising, fluid discharge, or sensitivity to light.  Follow instructions from your health care provider about caring for your eye after the procedure.  Do not rub your eye after the procedure. You may need to wear eye protection or sunglasses. Do not wear contact lenses. Keep the area around your eye clean and dry.  Avoid activities that require a lot of effort. These include playing sports and lifting heavy objects.  Contact a health care provider if you have increased bruising, pain that does not go away, or a fever. Get help right away if you suddenly lose your vision, see flashes of light or spots, or have severe pain in the eye. This information is not intended to replace advice given to you by your health care provider. Make sure you discuss any questions you have with your health care provider. Document Revised: 04/11/2019 Document Reviewed: 12/13/2017 Elsevier Patient Education  [place].

## 2020-02-16 ENCOUNTER — Encounter (HOSPITAL_COMMUNITY)
Admission: RE | Admit: 2020-02-16 | Discharge: 2020-02-16 | Disposition: A | Discharge status: Home / Self Care | Payer: 59 | Source: Ambulatory Visit | Attending: Ophthalmology | Admitting: Ophthalmology

## 2020-02-16 ENCOUNTER — Encounter (HOSPITAL_COMMUNITY): Payer: Self-pay

## 2020-02-16 ENCOUNTER — Other Ambulatory Visit: Payer: Self-pay

## 2020-02-16 ENCOUNTER — Other Ambulatory Visit (HOSPITAL_COMMUNITY)
Admission: RE | Admit: 2020-02-16 | Discharge: 2020-02-16 | Disposition: A | Discharge status: Home / Self Care | Payer: 59 | Source: Ambulatory Visit | Attending: Ophthalmology | Admitting: Ophthalmology

## 2020-02-16 DIAGNOSIS — Z01812 Encounter for preprocedural laboratory examination: Secondary | ICD-10-CM | POA: Diagnosis present

## 2020-02-16 DIAGNOSIS — Z20822 Contact with and (suspected) exposure to covid-19: Secondary | ICD-10-CM | POA: Diagnosis not present

## 2020-02-16 LAB — BASIC METABOLIC PANEL
Anion gap: 10 (ref 5–15)
BUN: 17 mg/dL (ref 6–20)
CO2: 25 mmol/L (ref 22–32)
Calcium: 9.4 mg/dL (ref 8.9–10.3)
Chloride: 103 mmol/L (ref 98–111)
Creatinine, Ser: 0.64 mg/dL (ref 0.44–1.00)
GFR calc Af Amer: >60 mL/min (ref >60)
GFR calc non Af Amer: >60 mL/min (ref >60)
Glucose, Bld: 103 mg/dL — ABNORMAL HIGH (ref 70–99)
Potassium: 3.5 mmol/L (ref 3.5–5.1)
Sodium: 138 mmol/L (ref 135–145)

## 2020-02-17 LAB — SARS CORONAVIRUS 2 (TAT 6-24 HRS): SARS Coronavirus 2: NEGATIVE

## 2020-02-19 ENCOUNTER — Ambulatory Visit (HOSPITAL_COMMUNITY)
Admission: RE | Admit: 2020-02-19 | Discharge: 2020-02-19 | Disposition: A | Discharge status: Home / Self Care | Payer: 59 | Attending: Ophthalmology | Admitting: Ophthalmology

## 2020-02-19 ENCOUNTER — Ambulatory Visit (HOSPITAL_COMMUNITY): Payer: 59 | Admitting: Anesthesiology

## 2020-02-19 ENCOUNTER — Other Ambulatory Visit: Payer: Self-pay

## 2020-02-19 ENCOUNTER — Encounter (HOSPITAL_COMMUNITY)
Admission: RE | Disposition: A | Discharge status: Home / Self Care | Payer: Self-pay | Source: Home / Self Care | Attending: Ophthalmology

## 2020-02-19 ENCOUNTER — Encounter (HOSPITAL_COMMUNITY): Payer: Self-pay | Admitting: Ophthalmology

## 2020-02-19 DIAGNOSIS — I10 Essential (primary) hypertension: Secondary | ICD-10-CM | POA: Diagnosis not present

## 2020-02-19 DIAGNOSIS — Z87891 Personal history of nicotine dependence: Secondary | ICD-10-CM | POA: Diagnosis not present

## 2020-02-19 DIAGNOSIS — J449 Chronic obstructive pulmonary disease, unspecified: Secondary | ICD-10-CM | POA: Diagnosis not present

## 2020-02-19 DIAGNOSIS — Z79899 Other long term (current) drug therapy: Secondary | ICD-10-CM | POA: Diagnosis not present

## 2020-02-19 DIAGNOSIS — Z885 Allergy status to narcotic agent status: Secondary | ICD-10-CM | POA: Diagnosis not present

## 2020-02-19 DIAGNOSIS — G4730 Sleep apnea, unspecified: Secondary | ICD-10-CM | POA: Diagnosis not present

## 2020-02-19 DIAGNOSIS — Z880 Allergy status to penicillin: Secondary | ICD-10-CM | POA: Diagnosis not present

## 2020-02-19 DIAGNOSIS — F319 Bipolar disorder, unspecified: Secondary | ICD-10-CM | POA: Insufficient documentation

## 2020-02-19 DIAGNOSIS — F419 Anxiety disorder, unspecified: Secondary | ICD-10-CM | POA: Insufficient documentation

## 2020-02-19 DIAGNOSIS — H25811 Combined forms of age-related cataract, right eye: Secondary | ICD-10-CM | POA: Insufficient documentation

## 2020-02-19 HISTORY — PX: CATARACT EXTRACTION W/PHACO: SHX586

## 2020-02-19 SURGERY — PHACOEMULSIFICATION, CATARACT, WITH IOL INSERTION
Anesthesia: Monitor Anesthesia Care | Site: Eye | Laterality: Right

## 2020-02-19 MED ORDER — PHENYLEPHRINE HCL 2.5 % OP SOLN
1.0000 [drp] | OPHTHALMIC | Status: AC | PRN
  Administered 2020-02-19 (×3): 1 [drp] via OPHTHALMIC

## 2020-02-19 MED ORDER — LIDOCAINE HCL 3.5 % OP GEL
1.0000 "application " | Freq: Once | OPHTHALMIC | Status: AC
  Administered 2020-02-19: 1 via OPHTHALMIC

## 2020-02-19 MED ORDER — SODIUM HYALURONATE 23 MG/ML IO SOLN
INTRAOCULAR | Status: DC | PRN
  Administered 2020-02-19: 0.6 mL via INTRAOCULAR

## 2020-02-19 MED ORDER — EPINEPHRINE PF 1 MG/ML IJ SOLN
INTRAOCULAR | Status: DC | PRN
  Administered 2020-02-19: 500 mL

## 2020-02-19 MED ORDER — POVIDONE-IODINE 5 % OP SOLN
OPHTHALMIC | Status: DC | PRN
  Administered 2020-02-19: 1 via OPHTHALMIC

## 2020-02-19 MED ORDER — PROVISC 10 MG/ML IO SOLN
INTRAOCULAR | Status: DC | PRN
  Administered 2020-02-19: 0.85 mL via INTRAOCULAR

## 2020-02-19 MED ORDER — BSS IO SOLN
INTRAOCULAR | Status: DC | PRN
  Administered 2020-02-19: 15 mL via INTRAOCULAR

## 2020-02-19 MED ORDER — NEOMYCIN-POLYMYXIN-DEXAMETH 3.5-10000-0.1 OP SUSP
OPHTHALMIC | Status: DC | PRN
  Administered 2020-02-19: 1 [drp] via OPHTHALMIC

## 2020-02-19 MED ORDER — CYCLOPENTOLATE-PHENYLEPHRINE 0.2-1 % OP SOLN
1.0000 [drp] | OPHTHALMIC | Status: AC | PRN
  Administered 2020-02-19 (×3): 1 [drp] via OPHTHALMIC

## 2020-02-19 MED ORDER — TETRACAINE HCL 0.5 % OP SOLN
1.0000 [drp] | OPHTHALMIC | Status: AC | PRN
  Administered 2020-02-19 (×3): 1 [drp] via OPHTHALMIC

## 2020-02-19 MED ORDER — LIDOCAINE HCL (PF) 1 % IJ SOLN
INTRAOCULAR | Status: DC | PRN
  Administered 2020-02-19: 1 mL via OPHTHALMIC

## 2020-02-19 SURGICAL SUPPLY — 12 items
CLOTH BEACON ORANGE TIMEOUT ST (SAFETY) ×2 IMPLANT
EYE SHIELD UNIVERSAL CLEAR (GAUZE/BANDAGES/DRESSINGS) ×2 IMPLANT
GLOVE BIOGEL PI IND STRL 7.0 (GLOVE) ×2 IMPLANT
GLOVE BIOGEL PI INDICATOR 7.0 (GLOVE) ×2
LENS ALC ACRYL/TECN (Ophthalmic Related) ×2 IMPLANT
NEEDLE HYPO 18GX1.5 BLUNT FILL (NEEDLE) ×2 IMPLANT
PAD ARMBOARD 7.5X6 YLW CONV (MISCELLANEOUS) ×2 IMPLANT
SYR TB 1ML LL NO SAFETY (SYRINGE) ×2 IMPLANT
TAPE SURG TRANSPORE 1 IN (GAUZE/BANDAGES/DRESSINGS) ×1 IMPLANT
TAPE SURGICAL TRANSPORE 1 IN (GAUZE/BANDAGES/DRESSINGS) ×2
VISCOELASTIC ADDITIONAL (OPHTHALMIC RELATED) ×2 IMPLANT
WATER STERILE IRR 250ML POUR (IV SOLUTION) ×2 IMPLANT

## 2020-02-19 NOTE — Transfer of Care (Signed)
Immediate Anesthesia Transfer of Care Note  Patient: Rachel [name]  Procedure(s) Performed: CATARACT EXTRACTION PHACO AND INTRAOCULAR LENS PLACEMENT (IOC) (Right Eye)  Patient Location: PACU  Anesthesia Type:MAC  Level of Consciousness: awake, alert  and oriented  Airway & Oxygen Therapy: Patient Spontanous Breathing  Post-op Assessment: Report given to RN, Post -op Vital signs reviewed and stable and Patient moving all extremities X 4  Post vital signs: Reviewed and stable  Last Vitals:  Vitals Value Taken Time  BP    Temp    Pulse    Resp    SpO2      Last Pain:  Vitals:   02/19/20 0723  PainSc: 0-No pain         Complications: No complications documented.

## 2020-02-19 NOTE — Op Note (Signed)
Date of procedure: 02/19/20  Pre-operative diagnosis:  Visually significant combined form age-related cataract, Right Eye (H25.811)  Post-operative diagnosis:  Visually significant combined form age-related cataract, Right Eye (H25.811)  Procedure: Removal of cataract via phacoemulsification and insertion of intra-ocular lens Wynetta Emery and [employer] DCB00  +24.5D into the capsular bag of the Right Eye  Attending surgeon: Gerda Diss. Aymar Whitfill, MD, MA  Anesthesia: MAC, Topical Akten  Complications: None  Estimated Blood Loss: <26m (minimal)  Specimens: None  Implants: As above  Indications:  Visually significant age-related cataract, Right Eye  Procedure:  The patient was seen and identified in the pre-operative area. The operative eye was identified and dilated.  The operative eye was marked.  Topical anesthesia was administered to the operative eye.     The patient was then to the operative suite and placed in the supine position.  A timeout was performed confirming the patient, procedure to be performed, and all other relevant information.   The patient's face was prepped and draped in the usual fashion for intra-ocular surgery.  A lid speculum was placed into the operative eye and the surgical microscope moved into place and focused.  A superotemporal paracentesis was created using a 20 gauge paracentesis blade.  Shugarcaine was injected into the anterior chamber.  Viscoelastic was injected into the anterior chamber.  A temporal clear-corneal main wound incision was created using a 2.437mmicrokeratome.  A continuous curvilinear capsulorrhexis was initiated using an irrigating cystitome and completed using capsulorrhexis forceps.  Hydrodissection and hydrodeliniation were performed.  Viscoelastic was injected into the anterior chamber.  A phacoemulsification handpiece and a chopper as a second instrument were used to remove the nucleus and epinucleus. The irrigation/aspiration handpiece was  used to remove any remaining cortical material.   The capsular bag was reinflated with viscoelastic, checked, and found to be intact.  The intraocular lens was inserted into the capsular bag.  The irrigation/aspiration handpiece was used to remove any remaining viscoelastic.  The clear corneal wound and paracentesis wounds were then hydrated and checked with Weck-Cels to be watertight.  The lid-speculum was removed.  The drape was removed.  The patient's face was cleaned with a wet and dry 4x4.   Maxitrol was instilled in the eye. A clear shield was taped over the eye. The patient was taken to the post-operative care unit in good condition, having tolerated the procedure well.  Post-Op Instructions: [facility] and will receive all other orders and instructions.

## 2020-02-19 NOTE — Interval H&P Note (Signed)
History and Physical Interval Note:  02/19/2020 8:50 AM  Rachel [name]  has presented today for surgery, with the diagnosis of nuclear cataract right eye.  The various methods of treatment have been discussed with the patient and family. After consideration of risks, benefits and other options for treatment, the patient has consented to  Procedure(s) with comments: CATARACT EXTRACTION PHACO AND INTRAOCULAR LENS PLACEMENT (IOC) (Right) - right as a surgical intervention.  The patient's history has been reviewed, patient examined, no change in status, stable for surgery.  I have reviewed the patient's chart and labs.  Questions were answered to the patient's satisfaction.     Baruch Goldmann

## 2020-02-19 NOTE — Discharge Instructions (Signed)
Please discharge patient when stable, will follow up today with Dr. Gianlucca Szymborski at the [facility] [place] office immediately following discharge.  Leave shield in place until visit.  All paperwork with discharge instructions will be given at the office.   [facility] Address:  [address]  [place], [place] [address]  

## 2020-02-19 NOTE — Anesthesia Preprocedure Evaluation (Signed)
Anesthesia Evaluation  Patient identified by MRN, date of birth, ID band Patient awake    Reviewed: Allergy & Precautions, H&P , NPO status , Patient's Chart, lab work & pertinent test results, reviewed documented beta blocker date and time   Airway Mallampati: II  TM Distance: >3 FB Neck ROM: full    Dental no notable dental hx.    Pulmonary sleep apnea , COPD,  COPD inhaler, former smoker,    Pulmonary exam normal breath sounds clear to auscultation       Cardiovascular Exercise Tolerance: Good hypertension, negative cardio ROS   Rhythm:regular Rate:Normal     Neuro/Psych PSYCHIATRIC DISORDERS Anxiety Depression Bipolar Disorder negative neurological ROS     GI/Hepatic Neg liver ROS, GERD  Medicated,  Endo/Other  negative endocrine ROS  Renal/GU negative Renal ROS  negative genitourinary   Musculoskeletal   Abdominal   Peds  Hematology  (+) Blood dyscrasia, anemia ,   Anesthesia Other Findings   Reproductive/Obstetrics negative OB ROS                             Anesthesia Physical Anesthesia Plan  ASA: III  Anesthesia Plan: MAC   Post-op Pain Management:    Induction:   PONV Risk Score and Plan:   Airway Management Planned:   Additional Equipment:   Intra-op Plan:   Post-operative Plan:   Informed Consent: I have reviewed the patients History and Physical, chart, labs and discussed the procedure including the risks, benefits and alternatives for the proposed anesthesia with the patient or authorized representative who has indicated his/her understanding and acceptance.     Dental Advisory Given  Plan Discussed with: CRNA  Anesthesia Plan Comments:         Anesthesia Quick Evaluation

## 2020-02-19 NOTE — Anesthesia Postprocedure Evaluation (Signed)
Anesthesia Post Note  Patient: Rachel [name]  Procedure(s) Performed: CATARACT EXTRACTION PHACO AND INTRAOCULAR LENS PLACEMENT (IOC) (Right Eye)  Patient location during evaluation: Phase II Anesthesia Type: MAC Level of consciousness: awake and alert Pain management: pain level controlled Vital Signs Assessment: post-procedure vital signs reviewed and stable Respiratory status: spontaneous breathing, nonlabored ventilation, respiratory function stable and patient connected to nasal cannula oxygen Cardiovascular status: blood pressure returned to baseline and stable Postop Assessment: no apparent nausea or vomiting Anesthetic complications: no   No complications documented.   Last Vitals:  Vitals:   02/19/20 0723 02/19/20 0915  BP: 121/76 131/82  Pulse: 90 89  Resp: 20 16  Temp: 36.7 C 36.5 C  SpO2: 98% 97%    Last Pain:  Vitals:   02/19/20 0915  TempSrc: Oral  PainSc: 0-No pain                 Talitha Givens

## 2020-02-20 ENCOUNTER — Encounter (HOSPITAL_COMMUNITY): Payer: Self-pay | Admitting: Ophthalmology

## 2020-04-29 ENCOUNTER — Other Ambulatory Visit: Payer: Self-pay

## 2020-04-29 ENCOUNTER — Encounter: Payer: Self-pay | Admitting: Orthopedic Surgery

## 2020-04-29 ENCOUNTER — Ambulatory Visit (INDEPENDENT_AMBULATORY_CARE_PROVIDER_SITE_OTHER): Payer: 59 | Admitting: Orthopedic Surgery

## 2020-04-29 VITALS — BP 161/88 | HR 103 | Ht 60.0 in | Wt 201.0 lb

## 2020-04-29 DIAGNOSIS — M1712 Unilateral primary osteoarthritis, left knee: Secondary | ICD-10-CM | POA: Diagnosis not present

## 2020-04-29 DIAGNOSIS — M7052 Other bursitis of knee, left knee: Secondary | ICD-10-CM | POA: Diagnosis not present

## 2020-04-29 MED ORDER — IBUPROFEN 800 MG PO TABS
800.0000 mg | ORAL_TABLET | Freq: Three times a day (TID) | ORAL | 5 refills | Status: DC | PRN
Start: 2020-04-29 — End: 2021-03-12

## 2020-04-29 NOTE — Progress Notes (Signed)
Chief Complaint  Patient presents with  . Knee Pain    left injection helped a little / ibuprofen not helping     59 year old female with osteoarthritis had MRI showed osteoarthritis no tear of the meniscus treating her with ibuprofen and periodic injections as needed  Reviewed her x-rays she has fairly significant compromise medial compartment with some secondary bone changes and progressing towards varus alignment of the knee  Pain is on the medial side of the tibia  Patient does a lot of walking at work may be aggravating her knee  BP (!) 161/88   Pulse (!) 103   Ht 5' (1.524 m)   Wt 201 lb (91.2 kg)   BMI 39.26 kg/m   Exam shows a well-developed well-nourished female alert and oriented x3 mood affect normal neurovascular exam is intact  The surrounding surfaces of the joint were nontender however the medial Pez bursa was tender  Impression chronic osteoarthritis  Pes bursitis  Recommend ice Injection Topical medication Refill ibuprofen  Follow-up as needed   Procedure note Left knee injection for bursitis   verbal consent was obtained to inject Left knee PES BURSA  Timeout was completed to confirm the site of injection  The medications used were 40 mg of Depo-Medrol and 1% lidocaine 3 cc  Anesthesia was provided by ethyl chloride and the skin was prepped with alcohol.  After cleaning the skin with alcohol a 25-gauge needle was used to inject the left knee bursa   There were no complications and a sterile bandage was applied  IMPRESSION: 1. Osteoarthritis of the medial compartment with full-thickness cartilage loss and subcortical edema in the medial femoral condyle and tibial plateau. 2. Moderate joint effusion with loose body in the joint. 3. 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     Electronically Signed   By: Lorriane Shire M.D.   On: 04/14/2019 12:54  BP (!) 161/88   Pulse (!) 103   Ht 5' (1.524 m)   Wt 201 lb (91.2 kg)   BMI 39.26 kg/m    Body mass index is 39.26 kg/m.  Meds ordered this encounter  Medications  . ibuprofen (ADVIL) 800 MG tablet    Sig: Take 1 tablet (800 mg total) by mouth every 8 (eight) hours as needed for moderate pain.    Dispense:  90 tablet    Refill:  5   No longer on oral voltaren

## 2020-06-05 ENCOUNTER — Other Ambulatory Visit (HOSPITAL_COMMUNITY): Payer: Self-pay | Admitting: Family Medicine

## 2020-06-05 DIAGNOSIS — Z1231 Encounter for screening mammogram for malignant neoplasm of breast: Secondary | ICD-10-CM

## 2020-06-13 ENCOUNTER — Ambulatory Visit (INDEPENDENT_AMBULATORY_CARE_PROVIDER_SITE_OTHER): Payer: 59 | Admitting: Orthopedic Surgery

## 2020-06-13 ENCOUNTER — Other Ambulatory Visit: Payer: Self-pay

## 2020-06-13 VITALS — Ht 60.0 in | Wt 200.0 lb

## 2020-06-13 DIAGNOSIS — M7052 Other bursitis of knee, left knee: Secondary | ICD-10-CM

## 2020-06-13 DIAGNOSIS — M1712 Unilateral primary osteoarthritis, left knee: Secondary | ICD-10-CM

## 2020-06-13 MED ORDER — MELOXICAM 7.5 MG PO TABS: 7.5000 mg | ORAL_TABLET | Freq: Every day | ORAL | 5 refills | Status: DC

## 2020-06-13 NOTE — Progress Notes (Signed)
Chief Complaint  Patient presents with   Follow-up    Recheck on left knee.    Encounter Diagnoses  Name Primary?   Primary osteoarthritis of left knee    Pes anserinus bursitis of left knee Yes    59 year old female with osteoarthritis has had her MRI which showed arthritis without tear of the meniscus.  She gets periodic injections and take ibuprofen for pain  Her x-ray shows significant medial compartment compromise with secondary bone changes and a hint of varus alignment  She presents for follow-up.  She complains of Medial knee pain near the pes tendon, she was doing very well until today  Exam of the left knee reveals no tenderness along the joint lines the tenderness and swelling is near the pes tendons she has full range of motion and a stable knee  Recommend injection  Encounter Diagnoses  Name Primary?   Primary osteoarthritis of left knee    Pes anserinus bursitis of left knee Yes   We will also start some medication  Procedure note Left knee injection for bursitis   verbal consent was obtained to inject Left knee PES BURSA  Timeout was completed to confirm the site of injection  The medications used were 40 mg of Depo-Medrol and 1% lidocaine 3 cc  Anesthesia was provided by ethyl chloride and the skin was prepped with alcohol.  After cleaning the skin with alcohol a 25-gauge needle was used to inject the left knee bursa   There were no complications and a sterile bandage was applied

## 2020-06-13 NOTE — Patient Instructions (Addendum)
You have received an injection of steroids into the joint. 15% of patients will have increased pain within the 24 hours postinjection.   This is transient and will go away.   We recommend that you use ice packs on the injection site for 20 minutes every 2 hours and extra strength Tylenol 2 tablets every 8 as needed until the pain resolves.  If you continue to have pain after taking the Tylenol and using the ice please call the office for further instructions.  Meds ordered this encounter  Medications  . meloxicam (MOBIC) 7.5 MG tablet    Sig: Take 1 tablet (7.5 mg total) by mouth daily.    Dispense:  30 tablet    Refill:  5

## 2020-09-25 ENCOUNTER — Telehealth: Payer: Self-pay | Admitting: Orthopedic Surgery

## 2020-09-25 NOTE — Telephone Encounter (Signed)
Patient called regarding her left knee; states "she needs to do something."  Reviewed last office notes; offered, and scheduled appointment, first available, 10/23/20, to discuss treatment options and/or surgery. If any recommendations other than coming in for appointment, please advise.

## 2020-09-25 NOTE — Telephone Encounter (Signed)
No nothing else, her insurance has denied coverage of the knee surgery We will se her next available, thanks.

## 2020-09-26 NOTE — Telephone Encounter (Signed)
Called patient; aware. 

## 2020-10-07 ENCOUNTER — Other Ambulatory Visit: Payer: Self-pay

## 2020-10-07 ENCOUNTER — Ambulatory Visit (HOSPITAL_COMMUNITY): Payer: 59 | Admitting: Clinical

## 2020-10-23 ENCOUNTER — Ambulatory Visit: Payer: 59 | Admitting: Orthopedic Surgery

## 2020-11-04 ENCOUNTER — Other Ambulatory Visit: Payer: Self-pay

## 2020-11-04 ENCOUNTER — Ambulatory Visit (HOSPITAL_COMMUNITY): Payer: 59 | Admitting: Clinical

## 2020-11-04 ENCOUNTER — Telehealth (HOSPITAL_COMMUNITY): Payer: Self-pay | Admitting: Clinical

## 2020-11-04 NOTE — Telephone Encounter (Signed)
The pt did not respond to video link, phone call, or VM 

## 2020-12-25 ENCOUNTER — Other Ambulatory Visit (HOSPITAL_COMMUNITY): Payer: Self-pay | Admitting: Internal Medicine

## 2020-12-25 DIAGNOSIS — Z1231 Encounter for screening mammogram for malignant neoplasm of breast: Secondary | ICD-10-CM

## 2021-02-10 ENCOUNTER — Ambulatory Visit (INDEPENDENT_AMBULATORY_CARE_PROVIDER_SITE_OTHER): Payer: 59 | Admitting: Orthopedic Surgery

## 2021-02-10 ENCOUNTER — Other Ambulatory Visit: Payer: Self-pay

## 2021-02-10 ENCOUNTER — Encounter: Payer: Self-pay | Admitting: Orthopedic Surgery

## 2021-02-10 VITALS — BP 166/90 | HR 98 | Ht 60.0 in | Wt 200.0 lb

## 2021-02-10 DIAGNOSIS — M1712 Unilateral primary osteoarthritis, left knee: Secondary | ICD-10-CM | POA: Diagnosis not present

## 2021-02-10 MED ORDER — BUPIVACAINE-MELOXICAM ER 200-6 MG/7ML IJ SOLN: 400.0000 mg | Freq: Once | INTRAMUSCULAR | Status: AC

## 2021-02-10 NOTE — Progress Notes (Signed)
  Chief Complaint  Patient presents with   Knee Pain    Left knee pain getting worse changed jobs standing a lot     Encounter Diagnosis  Name Primary?   Primary osteoarthritis of left knee Yes    Makensi has end-stage arthritis left knee she is considering total knee replacement  She says is very hard for her to go on at this point  She has had several anti-inflammatories as well as topical medications and injections  We have decided to go ahead and place the left knee  We will do that in September she will come in for her preop appointment  Chronic with exacerbation, preop surgical discussion  The procedure has been fully reviewed with the patient; The risks and benefits of surgery have been discussed and explained and understood. Alternative treatment has also been reviewed, questions were encouraged and answered. The postoperative plan is also been reviewed.  Time spent 20+ minutes

## 2021-02-10 NOTE — Patient Instructions (Addendum)
Your surgery will be at [facility] by Dr Aline Brochure expect to be in the hospital overnight. The hospital will contact you with a preoperative appointment to discuss Anesthesia. Please bring your medications with you for the appointment. They will tell you the arrival time and medication instructions when you have your preoperative evaluation. Do not wear nail polish the day of your surgery and since you take Phentermine you need to stop this medication ONE WEEK prior to your surgery.   You will also get a call from a representative of [employer], they have a machine that you will use in the first few weeks after surgery. It is called a CPM.   You will have home physical therapy for 2 weeks after surgery, the home health agency will call you before or just following the surgery to set up visits. [facility] is the agency we normally use, unless you request another agency.   You will get a call also from outpatient therapy for therapy starting when the home therapy is done.  If you have questions or need to Reschedule the surgery, call the office ask for Keshonda Monsour.

## 2021-03-06 ENCOUNTER — Other Ambulatory Visit: Payer: Self-pay | Admitting: Orthopedic Surgery

## 2021-03-06 DIAGNOSIS — M1712 Unilateral primary osteoarthritis, left knee: Secondary | ICD-10-CM

## 2021-03-10 ENCOUNTER — Encounter: Payer: Self-pay | Admitting: Orthopedic Surgery

## 2021-03-10 ENCOUNTER — Ambulatory Visit: Payer: 59

## 2021-03-10 ENCOUNTER — Ambulatory Visit (INDEPENDENT_AMBULATORY_CARE_PROVIDER_SITE_OTHER): Payer: 59 | Admitting: Orthopedic Surgery

## 2021-03-10 ENCOUNTER — Other Ambulatory Visit: Payer: Self-pay

## 2021-03-10 VITALS — BP 152/97 | HR 110 | Ht 60.0 in | Wt 200.0 lb

## 2021-03-10 DIAGNOSIS — M1712 Unilateral primary osteoarthritis, left knee: Secondary | ICD-10-CM

## 2021-03-10 NOTE — Progress Notes (Addendum)
Chief Complaint  Patient presents with   Knee Pain    Left/ has questions about knee replacement surgery insurance company needs documentation she is a non smoker and she has tried to lose weight     \60 year old female presents for preop left total knee  Patient has lost appropriate amount of weight BMI is now under 40 she stopped smoking several years ago  Unfortunately insurance is still declining her approval we will call them to see why  BP (!) 152/97   Pulse (!) 110   Ht 5' (1.524 m)   Wt 200 lb (90.7 kg)   BMI 39.06 kg/m   The left knee is still painful tender range of motion is painful throughout the knee is otherwise stable good muscle tone skin is intact distal neurovascular function is normal patient ambulates with a limp  The patient indicates her activities of daily living have become more difficult her lifestyle is difficult as well she would really like to proceed with operative treatment with a left total knee  We went over the model today showing the implant as well as the x-rays  X-ray shows osteoarthritis primarily medial compartment but also involving patellofemoral joint   Past Medical History:  Diagnosis Date   Anemia    Anxiety    Arthritis    in both knees   Depression    GERD (gastroesophageal reflux disease)    Hyperlipidemia    Hypertension    Sleep apnea    Past Surgical History:  Procedure Laterality Date   CARPAL TUNNEL RELEASE Right    CATARACT EXTRACTION W/PHACO Right 02/19/2020   Procedure: CATARACT EXTRACTION PHACO AND INTRAOCULAR LENS PLACEMENT ([place]);  Surgeon: Baruch Goldmann, MD;  Location: [facility];  Service: Ophthalmology;  Laterality: Right;  CDE: 4.83   CESAREAN SECTION     cesarian  1990   COLONOSCOPY N/A 08/10/2016   Procedure: COLONOSCOPY;  Surgeon: Danie Binder, MD;  Location: [facility];  Service: Endoscopy;  Laterality: N/A;  10:30 AM   fallopian tube removed N/A    Social History   Tobacco Use   Smoking status:  Former    Packs/day: 1.00    Years: 30.00    Pack years: 30.00    Types: Cigarettes    Quit date: 09/27/2016    Years since quitting: 4.4   Smokeless tobacco: Never  Vaping Use   Vaping Use: Never used  Substance Use Topics   Alcohol use: Yes    Comment: rarely   Drug use: Not Currently    Comment: hx of marijuana use   Family History  Problem Relation Age of Onset   Cancer Mother        lung   Heart attack Father    Stroke Paternal Grandmother    Kidney failure Maternal Grandmother    Cancer Maternal Grandfather        lung   Seizures Brother    Sarcoidosis Sister     Current Outpatient Medications:    clonazePAM (KLONOPIN) 1 MG tablet, Take 1 mg by mouth 3 (three) times daily as needed for anxiety., Disp: , Rfl:    diclofenac Sodium (VOLTAREN) 1 % GEL, Apply 2 g topically 3 (three) times daily as needed (pain)., Disp: , Rfl:    ibuprofen (ADVIL) 800 MG tablet, Take 1 tablet (800 mg total) by mouth every 8 (eight) hours as needed for moderate pain., Disp: 90 tablet, Rfl: 5   meloxicam (MOBIC) 7.5 MG tablet, Take 1 tablet (  7.5 mg total) by mouth daily., Disp: 30 tablet, Rfl: 5   pantoprazole (PROTONIX) 40 MG tablet, Take 40 mg by mouth daily., Disp: , Rfl:    phentermine 37.5 MG capsule, Take 37.5 mg by mouth daily., Disp: , Rfl:    telmisartan-hydrochlorothiazide (MICARDIS HCT) 80-25 MG tablet, Take 1 tablet by mouth daily., Disp: 30 tablet, Rfl: 5  Current Facility-Administered Medications:    bupivacaine-meloxicam ER (ZYNRELEF) injection 400 mg, 400 mg, Infiltration, Once, Carole Civil, MD   Body mass index is 39.06 kg/m.    The procedure has been fully reviewed with the patient; The risks and benefits of surgery have been discussed and explained and understood. Alternative treatment has also been reviewed, questions were encouraged and answered. The postoperative plan is also been reviewed.  Left total knee  1:49 PM  03/11/2021  [facility]:  As you  have requested further information ref approving surgery:(meanwhile the patient is in pain and functionally is suffering)   She has not smoked in several years   As for weight loss: she has been trying to lose weight for approx. 2 years and to her credit has done very well.  03/2019 she was 216 BMI  42  03/10/2021  200 BMI 39.06

## 2021-03-11 ENCOUNTER — Telehealth: Payer: Self-pay | Admitting: Radiology

## 2021-03-11 ENCOUNTER — Other Ambulatory Visit: Payer: Self-pay | Admitting: Orthopedic Surgery

## 2021-03-11 DIAGNOSIS — M1712 Unilateral primary osteoarthritis, left knee: Secondary | ICD-10-CM

## 2021-03-11 MED ORDER — 3-IN-1 BEDSIDE TOILET MISC: 0 refills | Status: DC

## 2021-03-11 NOTE — Telephone Encounter (Signed)
Faxed appeal to [facility] health

## 2021-03-11 NOTE — Patient Instructions (Signed)
Rachel [name]  03/11/2021     '@[email or web address]'$ @   Your procedure is scheduled on  03/18/2021.   Report to [facility] at  [place].M.   Call this number if you have problems the morning of surgery:  [phone]   Remember:  Do not eat or drink after midnight.      Take these medicines the morning of surgery with A SIP OF WATER             clonazepam, mobic (if needed), protonix.     Do not wear jewelry, make-up or nail polish.  Do not wear lotions, powders, or perfumes, or deodorant.  Do not shave 48 hours prior to surgery.  Men may shave face and neck.  Do not bring valuables to the hospital.  [facility] is not responsible for any belongings or valuables.  Contacts, dentures or bridgework may not be worn into surgery.  Leave your suitcase in the car.  After surgery it may be brought to your room.  For patients admitted to the hospital, discharge time will be determined by your treatment team.  Patients discharged the day of surgery will not be allowed to drive home and must have someone with them for 24 hours.    Special instructions:  DO NOT smoke tobacco or vape for 24 hours before your procedure.  Please read over the following fact sheets that you were given. Pain Booklet, Coughing and Deep Breathing, Blood Transfusion Information, Total Joint Packet, Surgical Site Infection Prevention, Anesthesia Post-op Instructions, and Care and Recovery After Surgery      Total Knee Replacement, Care After This sheet gives you information about how to care for yourself after your procedure. Your health care provider may also give you more specific instructions. If you have problems or questions, contact your health care provider. What can I expect after the procedure? After the procedure, it is common to have: Redness, pain, and swelling at the incision area. Stiffness. Discomfort. A small amount of blood or clear fluid coming from your  incision. Follow these instructions at home: Medicines Take over-the-counter and prescription medicines only as told by your health care provider. If you were prescribed a blood thinner (anticoagulant), take it as told by your health care provider. Ask your health care provider if the medicine prescribed to you: Requires you to avoid driving or using machinery. Can cause constipation. You may need to take these actions to prevent or treat constipation: Drink enough fluid to keep your urine pale yellow. Take over-the-counter or prescription medicines. Eat foods that are high in fiber, such as beans, whole grains, and fresh fruits and vegetables. Limit foods that are high in fat and processed sugars, such as fried or sweet foods. Incision care  Follow instructions from your health care provider about how to take care of your incision. Make sure you: Wash your hands with soap and water for at least 20 seconds before and after you change your bandage (dressing). If soap and water are not available, use hand sanitizer. Change your dressing as told by your health care provider. Leave stitches (sutures), staples, skin glue, or adhesive strips in place. These skin closures may need to stay in place for 2 weeks or longer. If adhesive strip edges start to loosen and curl up, you may trim the loose edges. Do not remove adhesive strips completely unless your health care provider tells you to do that. Do not  take baths, swim, or use a hot tub until your health care provider approves. Check your incision area every day for signs of infection. Check for: More redness, swelling, or pain. More fluid or blood. Warmth. Pus or a bad smell. Activity Rest as told by your health care provider. Avoid sitting for a long time without moving. Get up to take short walks every 1-2 hours. This is important to improve blood flow and breathing. Ask for help if you feel weak or unsteady. Follow instructions from your  health care provider about using a walker, crutches, or a cane. You may use your legs to support (bear) your body weight as told by your health care provider. Follow instructions about how much weight you may safely support on your affected leg (weight-bearing restrictions). A physical therapist may show you how to get out of a bed and chair and how to go up and down stairs. You will first do this with a walker, crutches, or a cane and then without any of these devices. Once you are able to walk without a limp, you may stop using a walker, crutches, or a cane. Do exercises as told by your health care provider or physical therapist. Avoid high-impact activities, including running, jumping rope, and doing jumping jacks. Do not play contact sports until your health care provider approves. Return to your normal activities as told by your health care provider. Ask your health care provider what activities are safe for you. Managing pain, stiffness, and swelling  If directed, put ice on your knee. To do this: Put ice in a plastic bag or use the icing device (cold flow pad) that you were given. Follow instructions from your health care provider about how to use the icing device. Place a towel between your skin and the bag or between your skin and the icing device. Leave the ice on for 20 minutes, 2-3 times a day. Remove the ice if your skin turns bright red. This is very important. If you cannot feel pain, heat, or cold, you have a greater risk of damage to the area. Move your toes often to reduce stiffness and swelling. Raise (elevate) your leg above the level of your heart while you are sitting or lying down. Use several pillows to keep your leg straight. Do not put a pillow just under the knee. If the knee is bent for a long time, this may lead to stiffness. Wear elastic knee support as told by your health care provider. Safety  To help prevent falls, keep floors clear of objects you may trip over.  Place items that you may need within easy reach. Wear an apron or tool belt with pockets for carrying objects. This leaves your hands free to help with your balance. Ask your health care provider when it is safe to drive. General instructions Wear compression stockings as told by your health care provider. These stockings help to prevent blood clots and reduce swelling in your legs. Continue with breathing exercises. This helps prevent lung infection. Do not use any products that contain nicotine or tobacco. These products include cigarettes, chewing tobacco, and vaping devices, such as e-cigarettes. These can delay healing after surgery. If you need help quitting, ask your health care provider. Tell your health care provider if you plan to have dental work. Also: Tell your dentist about your joint replacement. Ask your health care provider if there are any special instructions you need to follow before having dental care and routine cleanings. Keep  all follow-up visits. This is important. Contact a health care provider if: You have a fever or chills. You have a cough or feel short of breath. Your medicine is not controlling your pain. You have any of these signs of infection: More redness, swelling, or pain around your incision. More fluid or blood coming from your incision. Warmth coming from your incision. Pus or a bad smell coming from your incision. You fall. Get help right away if: You have severe pain. You have trouble breathing. You have chest pain. You have redness, swelling, pain, or warmth in your calf or leg. Your incision breaks open after sutures or staples are removed. These symptoms may represent a serious problem that is an emergency. Do not wait to see if the symptoms will go away. Get medical help right away. Call your local emergency services (911 in the [place] Do not drive yourself to the hospital. Summary After the procedure, it is common to have pain and swelling  at the incision area, a small amount of blood or fluid coming from your incision, and stiffness. Follow instructions from your health care provider about how to take care of your incision. Use crutches, a walker, or a cane as told by your health care provider. This information is not intended to replace advice given to you by your health care provider. Make sure you discuss any questions you have with your health care provider. Document Revised: 12/05/2019 Document Reviewed: 12/05/2019 Elsevier Patient Education  [employer]. Spinal Anesthesia and Epidural Anesthesia, Care After This sheet gives you information about how to care for yourself after your procedure. Your doctor may also give you more specific instructions. If you have problems or questions, contact your doctor. What can I expect after the procedure? While the medicines you were given are still having effects, it is common to: Feel like you may vomit (nauseous). Vomit. Have a numb feeling or tingling in your legs. Have problems when you pee (urinate). Feel itchy. Follow these instructions at home: For the time period you were told by your doctor:  Rest. Do not do activities where you could fall or get hurt. Do not drive or use machines. Do not drink alcohol. Do not take sleeping pills or medicines that make you drowsy. Do not make big decisions or sign legal documents. Do not take care of children on your own. Eating and drinking If you vomit, wait a short time. When you can drink without vomiting, try to drink some water, juice, or clear soup. Drink enough fluid to keep your pee (urine) pale yellow. Make sure you do not feel like vomiting before you eat solid foods. Follow the diet that your doctor recommends. General instructions If you have sleep apnea, surgery and certain medicines can raise your risk for breathing problems. Follow instructions from your doctor about when to wear your sleep device. Have a  responsible adult stay with you for the time you are told. It is important to have someone help care for you until you are awake and alert. Return to your normal activities as told by your doctor. Ask your doctor what activities are safe for you. Take over-the-counter and prescription medicines only as told by your doctor. Do not use any products that contain nicotine or tobacco, such as cigarettes, e-cigarettes, and chewing tobacco. If you need help quitting, ask your doctor. Keep all follow-up visits as told by your doctor. This is important. Contact a doctor if: It has been more than one  day since your procedure and you feel like you may vomit or you are vomiting. You have a rash. Get help right away if: You have a fever. You have a headache that lasts a long time. You have a very bad headache. Your vision is blurry or you see two of a single object (double vision). You are dizzy or light-headed. You faint. Your arms or legs tingle, feel weak, or get numb. You have trouble breathing. You cannot pee. These symptoms may be an emergency. Get help right away. Call your local emergency services (911 in the [place] Do not wait to see if the symptoms will go away. Do not drive yourself to the hospital. Summary After the procedure, have a responsible adult stay with you at home. Do not do activities that might cause you to get hurt. Do not drive, use machinery, drink alcohol, or make important decisions as told by your doctor. Do not use products that contain nicotine or tobacco. Get help right away if you have a very bad headache, trouble breathing, or you cannot pee. This information is not intended to replace advice given to you by your health care provider. Make sure you discuss any questions you have with your health care provider. Document Revised: 02/29/2020 Document Reviewed: 08/03/2019 Elsevier Patient Education  [place] Anesthesia, Adult, Care After This sheet  gives you information about how to care for yourself after your procedure. Your health care provider may also give you more specific instructions. If you have problems or questions, contact your health care provider. What can I expect after the procedure? After the procedure, the following side effects are common: Pain or discomfort at the IV site. Nausea. Vomiting. Sore throat. Trouble concentrating. Feeling cold or chills. Feeling weak or tired. Sleepiness and fatigue. Soreness and body aches. These side effects can affect parts of the body that were not involved in surgery. Follow these instructions at home: For the time period you were told by your health care provider:  Rest. Do not participate in activities where you could fall or become injured. Do not drive or use machinery. Do not drink alcohol. Do not take sleeping pills or medicines that cause drowsiness. Do not make important decisions or sign legal documents. Do not take care of children on your own. Eating and drinking Follow any instructions from your health care provider about eating or drinking restrictions. When you feel hungry, start by eating small amounts of foods that are soft and easy to digest (bland), such as toast. Gradually return to your regular diet. Drink enough fluid to keep your urine pale yellow. If you vomit, rehydrate by drinking water, juice, or clear broth. General instructions If you have sleep apnea, surgery and certain medicines can increase your risk for breathing problems. Follow instructions from your health care provider about wearing your sleep device: Anytime you are sleeping, including during daytime naps. While taking prescription pain medicines, sleeping medicines, or medicines that make you drowsy. Have a responsible adult stay with you for the time you are told. It is important to have someone help care for you until you are awake and alert. Return to your normal activities as told by  your health care provider. Ask your health care provider what activities are safe for you. Take over-the-counter and prescription medicines only as told by your health care provider. If you smoke, do not smoke without supervision. Keep all follow-up visits as told by your health care provider. This is important. Contact  a health care provider if: You have nausea or vomiting that does not get better with medicine. You cannot eat or drink without vomiting. You have pain that does not get better with medicine. You are unable to pass urine. You develop a skin rash. You have a fever. You have redness around your IV site that gets worse. Get help right away if: You have difficulty breathing. You have chest pain. You have blood in your urine or stool, or you vomit blood. Summary After the procedure, it is common to have a sore throat or nausea. It is also common to feel tired. Have a responsible adult stay with you for the time you are told. It is important to have someone help care for you until you are awake and alert. When you feel hungry, start by eating small amounts of foods that are soft and easy to digest (bland), such as toast. Gradually return to your regular diet. Drink enough fluid to keep your urine pale yellow. Return to your normal activities as told by your health care provider. Ask your health care provider what activities are safe for you. This information is not intended to replace advice given to you by your health care provider. Make sure you discuss any questions you have with your health care provider. Document Revised: 02/29/2020 Document Reviewed: 09/28/2019 Elsevier Patient Education  2022 [employer]. How to Use Chlorhexidine for Bathing Chlorhexidine gluconate (CHG) is a germ-killing (antiseptic) solution that is used to clean the skin. It can get rid of the bacteria that normally live on the skin and can keep them away for about 24 hours. To clean your skin with CHG,  you may be given: A CHG solution to use in the shower or as part of a sponge bath. A prepackaged cloth that contains CHG. Cleaning your skin with CHG may help lower the risk for infection: While you are staying in the intensive care unit of the hospital. If you have a vascular access, such as a central line, to provide short-term or long-term access to your veins. If you have a catheter to drain urine from your bladder. If you are on a ventilator. A ventilator is a machine that helps you breathe by moving air in and out of your lungs. After surgery. What are the risks? Risks of using CHG include: A skin reaction. Hearing loss, if CHG gets in your ears and you have a perforated eardrum. Eye injury, if CHG gets in your eyes and is not rinsed out. The CHG product catching fire. Make sure that you avoid smoking and flames after applying CHG to your skin. Do not use CHG: If you have a chlorhexidine allergy or have previously reacted to chlorhexidine. On babies younger than 59 months of age. How to use CHG solution Use CHG only as told by your health care provider, and follow the instructions on the label. Use the full amount of CHG as directed. Usually, this is one bottle. During a shower Follow these steps when using CHG solution during a shower (unless your health care provider gives you different instructions): Start the shower. Use your normal soap and shampoo to wash your face and hair. Turn off the shower or move out of the shower stream. Pour the CHG onto a clean washcloth. Do not use any type of brush or rough-edged sponge. Starting at your neck, lather your body down to your toes. Make sure you follow these instructions: If you will be having surgery, pay special attention  to the part of your body where you will be having surgery. Scrub this area for at least 1 minute. Do not use CHG on your head or face. If the solution gets into your ears or eyes, rinse them well with water. Avoid  your genital area. Avoid any areas of skin that have broken skin, cuts, or scrapes. Scrub your back and under your arms. Make sure to wash skin folds. Let the lather sit on your skin for 1-2 minutes or as long as told by your health care provider. Thoroughly rinse your entire body in the shower. Make sure that all body creases and crevices are rinsed well. Dry off with a clean towel. Do not put any substances on your body afterward--such as powder, lotion, or perfume--unless you are told to do so by your health care provider. Only use lotions that are recommended by the manufacturer. Put on clean clothes or pajamas. If it is the night before your surgery, sleep in clean sheets.  During a sponge bath Follow these steps when using CHG solution during a sponge bath (unless your health care provider gives you different instructions): Use your normal soap and shampoo to wash your face and hair. Pour the CHG onto a clean washcloth. Starting at your neck, lather your body down to your toes. Make sure you follow these instructions: If you will be having surgery, pay special attention to the part of your body where you will be having surgery. Scrub this area for at least 1 minute. Do not use CHG on your head or face. If the solution gets into your ears or eyes, rinse them well with water. Avoid your genital area. Avoid any areas of skin that have broken skin, cuts, or scrapes. Scrub your back and under your arms. Make sure to wash skin folds. Let the lather sit on your skin for 1-2 minutes or as long as told by your health care provider. Using a different clean, wet washcloth, thoroughly rinse your entire body. Make sure that all body creases and crevices are rinsed well. Dry off with a clean towel. Do not put any substances on your body afterward--such as powder, lotion, or perfume--unless you are told to do so by your health care provider. Only use lotions that are recommended by the manufacturer. Put  on clean clothes or pajamas. If it is the night before your surgery, sleep in clean sheets. How to use CHG prepackaged cloths Only use CHG cloths as told by your health care provider, and follow the instructions on the label. Use the CHG cloth on clean, dry skin. Do not use the CHG cloth on your head or face unless your health care provider tells you to. When washing with the CHG cloth: Avoid your genital area. Avoid any areas of skin that have broken skin, cuts, or scrapes. Before surgery Follow these steps when using a CHG cloth to clean before surgery (unless your health care provider gives you different instructions): Using the CHG cloth, vigorously scrub the part of your body where you will be having surgery. Scrub using a back-and-forth motion for 3 minutes. The area on your body should be completely wet with CHG when you are done scrubbing. Do not rinse. Discard the cloth and let the area air-dry. Do not put any substances on the area afterward, such as powder, lotion, or perfume. Put on clean clothes or pajamas. If it is the night before your surgery, sleep in clean sheets.  For general bathing Follow these  steps when using CHG cloths for general bathing (unless your health care provider gives you different instructions). Use a separate CHG cloth for each area of your body. Make sure you wash between any folds of skin and between your fingers and toes. Wash your body in the following order, switching to a new cloth after each step: The front of your neck, shoulders, and chest. Both of your arms, under your arms, and your hands. Your stomach and groin area, avoiding the genitals. Your right leg and foot. Your left leg and foot. The back of your neck, your back, and your buttocks. Do not rinse. Discard the cloth and let the area air-dry. Do not put any substances on your body afterward--such as powder, lotion, or perfume--unless you are told to do so by your health care provider. Only  use lotions that are recommended by the manufacturer. Put on clean clothes or pajamas. Contact a health care provider if: Your skin gets irritated after scrubbing. You have questions about using your solution or cloth. You swallow any chlorhexidine. Call your local poison control center (1-(681) 805-5927 in the [place] Get help right away if: Your eyes itch badly, or they become very red or swollen. Your skin itches badly and is red or swollen. Your hearing changes. You have trouble seeing. You have swelling or tingling in your mouth or throat. You have trouble breathing. These symptoms may represent a serious problem that is an emergency. Do not wait to see if the symptoms will go away. Get medical help right away. Call your local emergency services (911 in the [place] Do not drive yourself to the hospital. Summary Chlorhexidine gluconate (CHG) is a germ-killing (antiseptic) solution that is used to clean the skin. Cleaning your skin with CHG may help to lower your risk for infection. You may be given CHG to use for bathing. It may be in a bottle or in a prepackaged cloth to use on your skin. Carefully follow your health care provider's instructions and the instructions on the product label. Do not use CHG if you have a chlorhexidine allergy. Contact your health care provider if your skin gets irritated after scrubbing. This information is not intended to replace advice given to you by your health care provider. Make sure you discuss any questions you have with your health care provider. Document Revised: 08/26/2020 Document Reviewed: 08/26/2020 Elsevier Patient Education  2022 [employer].

## 2021-03-11 NOTE — Addendum Note (Signed)
Addended byCandice Camp on: 03/11/2021 02:48 PM   Modules accepted: Orders

## 2021-03-11 NOTE — Telephone Encounter (Signed)
Will you dictate something about her weight loss? The insurance company needs it to approve the surgery, need as soon as possible please.

## 2021-03-12 NOTE — Telephone Encounter (Signed)
Appeal received [id number] is the case number and it may take 30 days If I dont have approval by tomorrow I will call her to let her know we need to RS

## 2021-03-13 ENCOUNTER — Other Ambulatory Visit (HOSPITAL_COMMUNITY)
Admission: RE | Admit: 2021-03-13 | Discharge: 2021-03-13 | Disposition: A | Discharge status: Home / Self Care | Payer: 59 | Source: Ambulatory Visit | Attending: Orthopedic Surgery | Admitting: Orthopedic Surgery

## 2021-03-13 ENCOUNTER — Other Ambulatory Visit: Payer: Self-pay

## 2021-03-13 ENCOUNTER — Encounter (HOSPITAL_COMMUNITY)
Admission: RE | Admit: 2021-03-13 | Discharge: 2021-03-13 | Disposition: A | Discharge status: Home / Self Care | Payer: 59 | Source: Ambulatory Visit | Attending: Orthopedic Surgery | Admitting: Orthopedic Surgery

## 2021-03-13 DIAGNOSIS — Z01818 Encounter for other preprocedural examination: Secondary | ICD-10-CM | POA: Insufficient documentation

## 2021-03-13 DIAGNOSIS — Z20822 Contact with and (suspected) exposure to covid-19: Secondary | ICD-10-CM | POA: Diagnosis not present

## 2021-03-13 LAB — CBC WITH DIFFERENTIAL/PLATELET
Abs Immature Granulocytes: 0.01 10*3/uL (ref 0.00–0.07)
Basophils Absolute: 0 10*3/uL (ref 0.0–0.1)
Basophils Relative: 1 %
Eosinophils Absolute: 0.2 10*3/uL (ref 0.0–0.5)
Eosinophils Relative: 4 %
HCT: 40.8 % (ref 36.0–46.0)
Hemoglobin: 12.5 g/dL (ref 12.0–15.0)
Immature Granulocytes: 0 %
Lymphocytes Relative: 38 %
Lymphs Abs: 1.7 10*3/uL (ref 0.7–4.0)
MCH: 27.5 pg (ref 26.0–34.0)
MCHC: 30.6 g/dL (ref 30.0–36.0)
MCV: 89.9 fL (ref 80.0–100.0)
Monocytes Absolute: 0.4 10*3/uL (ref 0.1–1.0)
Monocytes Relative: 10 %
Neutro Abs: 2.1 10*3/uL (ref 1.7–7.7)
Neutrophils Relative %: 47 %
Platelets: 409 10*3/uL — ABNORMAL HIGH (ref 150–400)
RBC: 4.54 MIL/uL (ref 3.87–5.11)
RDW: 14.3 % (ref 11.5–15.5)
WBC: 4.5 10*3/uL (ref 4.0–10.5)
nRBC: 0 % (ref 0.0–0.2)

## 2021-03-13 LAB — PREPARE RBC (CROSSMATCH)

## 2021-03-13 LAB — BASIC METABOLIC PANEL
Anion gap: 7 (ref 5–15)
BUN: 15 mg/dL (ref 6–20)
CO2: 25 mmol/L (ref 22–32)
Calcium: 9 mg/dL (ref 8.9–10.3)
Chloride: 106 mmol/L (ref 98–111)
Creatinine, Ser: 0.54 mg/dL (ref 0.44–1.00)
GFR, Estimated: >60 mL/min (ref >60)
Glucose, Bld: 105 mg/dL — ABNORMAL HIGH (ref 70–99)
Potassium: 4.1 mmol/L (ref 3.5–5.1)
Sodium: 138 mmol/L (ref 135–145)

## 2021-03-13 LAB — SARS CORONAVIRUS 2 (TAT 6-24 HRS): SARS Coronavirus 2: NEGATIVE

## 2021-03-13 LAB — TYPE AND SCREEN
ABO/RH(D): O POS
Antibody Screen: NEGATIVE

## 2021-03-17 NOTE — Telephone Encounter (Signed)
Abigail Butts checked, it is approved now.

## 2021-03-17 NOTE — H&P (Signed)
Chief Complaint  Patient presents with   Knee Pain      Left/ has questions about knee replacement surgery insurance company needs documentation she is a non smoker and she has tried to lose weight       \60 year old female presents for preop left total knee  Patient has lost appropriate amount of weight BMI is now under 40 she stopped smoking several years ago  Unfortunately insurance is still declining her approval we will call them to see why   BP (!) 152/97   Pulse (!) 110   Ht 5' (1.524 m)   Wt 200 lb (90.7 kg)   BMI 39.06 kg/m   The left knee is still painful tender range of motion is painful throughout the knee is otherwise stable good muscle tone skin is intact distal neurovascular function is normal patient ambulates with a limp  The patient indicates her activities of daily living have become more difficult her lifestyle is difficult as well she would really like to proceed with operative treatment with a left total knee  We went over the model today showing the implant as well as the x-rays  X-ray shows osteoarthritis primarily medial compartment but also involving patellofemoral joint        Past Medical History:  Diagnosis Date   Anemia     Anxiety     Arthritis      in both knees   Depression     GERD (gastroesophageal reflux disease)     Hyperlipidemia     Hypertension     Sleep apnea           Past Surgical History:  Procedure Laterality Date   CARPAL TUNNEL RELEASE Right     CATARACT EXTRACTION W/PHACO Right 02/19/2020    Procedure: CATARACT EXTRACTION PHACO AND INTRAOCULAR LENS PLACEMENT ([place]);  Surgeon: Baruch Goldmann, MD;  Location: [facility];  Service: Ophthalmology;  Laterality: Right;  CDE: 4.83   CESAREAN SECTION       cesarian   1990   COLONOSCOPY N/A 08/10/2016    Procedure: COLONOSCOPY;  Surgeon: Danie Binder, MD;  Location: [facility];  Service: Endoscopy;  Laterality: N/A;  10:30 AM   fallopian tube removed N/A      Social History          Tobacco Use   Smoking status: Former      Packs/day: 1.00      Years: 30.00      Pack years: 30.00      Types: Cigarettes      Quit date: 09/27/2016      Years since quitting: 4.4   Smokeless tobacco: Never  Vaping Use   Vaping Use: Never used  Substance Use Topics   Alcohol use: Yes      Comment: rarely   Drug use: Not Currently      Comment: hx of marijuana use         Family History  Problem Relation Age of Onset   Cancer Mother          lung   Heart attack Father     Stroke Paternal Grandmother     Kidney failure Maternal Grandmother     Cancer Maternal Grandfather          lung   Seizures Brother     Sarcoidosis Sister        Current Outpatient Medications:    clonazePAM (KLONOPIN) 1 MG tablet, Take 1 mg by mouth 3 (three)  times daily as needed for anxiety., Disp: , Rfl:    diclofenac Sodium (VOLTAREN) 1 % GEL, Apply 2 g topically 3 (three) times daily as needed (pain)., Disp: , Rfl:    ibuprofen (ADVIL) 800 MG tablet, Take 1 tablet (800 mg total) by mouth every 8 (eight) hours as needed for moderate pain., Disp: 90 tablet, Rfl: 5   meloxicam (MOBIC) 7.5 MG tablet, Take 1 tablet (7.5 mg total) by mouth daily., Disp: 30 tablet, Rfl: 5   pantoprazole (PROTONIX) 40 MG tablet, Take 40 mg by mouth daily., Disp: , Rfl:    phentermine 37.5 MG capsule, Take 37.5 mg by mouth daily., Disp: , Rfl:    telmisartan-hydrochlorothiazide (MICARDIS HCT) 80-25 MG tablet, Take 1 tablet by mouth daily., Disp: 30 tablet, Rfl: 5   Current Facility-Administered Medications:    bupivacaine-meloxicam ER (ZYNRELEF) injection 400 mg, 400 mg, Infiltration, Once, Carole Civil, MD     Body mass index is 39.06 kg/m.       The procedure has been fully reviewed with the patient; The risks and benefits of surgery have been discussed and explained and understood. Alternative treatment has also been reviewed, questions were encouraged and answered. The postoperative plan is also been  reviewed.   Left total knee

## 2021-03-18 ENCOUNTER — Inpatient Hospital Stay (HOSPITAL_COMMUNITY)
Admission: RE | Admit: 2021-03-18 | Discharge: 2021-03-20 | DRG: 470 | Disposition: A | Discharge status: Home Health | Payer: 59 | Attending: Orthopedic Surgery | Admitting: Orthopedic Surgery

## 2021-03-18 ENCOUNTER — Other Ambulatory Visit: Payer: Self-pay

## 2021-03-18 ENCOUNTER — Encounter (HOSPITAL_COMMUNITY)
Admission: RE | Disposition: A | Discharge status: Home Health | Payer: Self-pay | Source: Home / Self Care | Attending: Orthopedic Surgery

## 2021-03-18 ENCOUNTER — Encounter (HOSPITAL_COMMUNITY): Payer: Self-pay | Admitting: Orthopedic Surgery

## 2021-03-18 ENCOUNTER — Ambulatory Visit (HOSPITAL_COMMUNITY): Payer: 59

## 2021-03-18 ENCOUNTER — Ambulatory Visit (HOSPITAL_COMMUNITY): Payer: 59 | Admitting: Certified Registered"

## 2021-03-18 DIAGNOSIS — Z96652 Presence of left artificial knee joint: Secondary | ICD-10-CM

## 2021-03-18 DIAGNOSIS — Z791 Long term (current) use of non-steroidal anti-inflammatories (NSAID): Secondary | ICD-10-CM

## 2021-03-18 DIAGNOSIS — Z20822 Contact with and (suspected) exposure to covid-19: Secondary | ICD-10-CM | POA: Diagnosis present

## 2021-03-18 DIAGNOSIS — F419 Anxiety disorder, unspecified: Secondary | ICD-10-CM | POA: Diagnosis present

## 2021-03-18 DIAGNOSIS — Z9841 Cataract extraction status, right eye: Secondary | ICD-10-CM

## 2021-03-18 DIAGNOSIS — M1712 Unilateral primary osteoarthritis, left knee: Principal | ICD-10-CM

## 2021-03-18 DIAGNOSIS — Z6839 Body mass index (BMI) 39.0-39.9, adult: Secondary | ICD-10-CM

## 2021-03-18 DIAGNOSIS — Z823 Family history of stroke: Secondary | ICD-10-CM

## 2021-03-18 DIAGNOSIS — Z87891 Personal history of nicotine dependence: Secondary | ICD-10-CM

## 2021-03-18 DIAGNOSIS — K219 Gastro-esophageal reflux disease without esophagitis: Secondary | ICD-10-CM | POA: Diagnosis present

## 2021-03-18 DIAGNOSIS — Z8249 Family history of ischemic heart disease and other diseases of the circulatory system: Secondary | ICD-10-CM

## 2021-03-18 DIAGNOSIS — G4730 Sleep apnea, unspecified: Secondary | ICD-10-CM | POA: Diagnosis present

## 2021-03-18 DIAGNOSIS — I10 Essential (primary) hypertension: Secondary | ICD-10-CM | POA: Diagnosis present

## 2021-03-18 DIAGNOSIS — Z79899 Other long term (current) drug therapy: Secondary | ICD-10-CM

## 2021-03-18 DIAGNOSIS — E785 Hyperlipidemia, unspecified: Secondary | ICD-10-CM | POA: Diagnosis present

## 2021-03-18 DIAGNOSIS — F32A Depression, unspecified: Secondary | ICD-10-CM | POA: Diagnosis present

## 2021-03-18 DIAGNOSIS — Z961 Presence of intraocular lens: Secondary | ICD-10-CM | POA: Diagnosis present

## 2021-03-18 HISTORY — PX: TOTAL KNEE ARTHROPLASTY: SHX125

## 2021-03-18 LAB — SARS CORONAVIRUS 2 BY RT PCR (HOSPITAL ORDER, PERFORMED IN ~~LOC~~ HOSPITAL LAB): SARS Coronavirus 2: NEGATIVE

## 2021-03-18 SURGERY — ARTHROPLASTY, KNEE, TOTAL
Anesthesia: General | Site: Knee | Laterality: Left

## 2021-03-18 MED ORDER — ONDANSETRON HCL 4 MG/2ML IJ SOLN
4.0000 mg | Freq: Once | INTRAMUSCULAR | Status: AC
  Administered 2021-03-18: 4 mg via INTRAVENOUS
  Filled 2021-03-18: qty 2

## 2021-03-18 MED ORDER — CHLORHEXIDINE GLUCONATE 0.12 % MT SOLN
15.0000 mL | Freq: Once | OROMUCOSAL | Status: AC
  Administered 2021-03-18: 15 mL via OROMUCOSAL

## 2021-03-18 MED ORDER — MIDAZOLAM HCL 5 MG/5ML IJ SOLN
INTRAMUSCULAR | Status: DC | PRN
  Administered 2021-03-18 (×2): 2 mg via INTRAVENOUS

## 2021-03-18 MED ORDER — IRBESARTAN 150 MG PO TABS
300.0000 mg | ORAL_TABLET | Freq: Every day | ORAL | Status: DC
  Administered 2021-03-19 – 2021-03-20 (×2): 300 mg via ORAL
  Filled 2021-03-18 (×2): qty 2

## 2021-03-18 MED ORDER — CELECOXIB 400 MG PO CAPS
400.0000 mg | ORAL_CAPSULE | Freq: Once | ORAL | Status: AC
  Administered 2021-03-18: 400 mg via ORAL
  Filled 2021-03-18: qty 1

## 2021-03-18 MED ORDER — SODIUM CHLORIDE 0.9 % IR SOLN
Status: DC | PRN
  Administered 2021-03-18: 3000 mL

## 2021-03-18 MED ORDER — FENTANYL CITRATE (PF) 100 MCG/2ML IJ SOLN
INTRAMUSCULAR | Status: DC | PRN
  Administered 2021-03-18 (×2): 50 ug via INTRAVENOUS

## 2021-03-18 MED ORDER — OXYCODONE HCL 5 MG PO TABS
5.0000 mg | ORAL_TABLET | ORAL | Status: DC | PRN
  Administered 2021-03-18 – 2021-03-19 (×2): 5 mg via ORAL
  Filled 2021-03-18 (×2): qty 1

## 2021-03-18 MED ORDER — FENTANYL CITRATE PF 50 MCG/ML IJ SOSY
PREFILLED_SYRINGE | INTRAMUSCULAR | Status: AC
  Filled 2021-03-18: qty 1

## 2021-03-18 MED ORDER — DIPHENHYDRAMINE HCL 12.5 MG/5ML PO ELIX: 12.5000 mg | ORAL_SOLUTION | ORAL | Status: DC | PRN

## 2021-03-18 MED ORDER — PROPOFOL 10 MG/ML IV BOLUS
INTRAVENOUS | Status: DC | PRN
  Administered 2021-03-18: 30 mg via INTRAVENOUS

## 2021-03-18 MED ORDER — ASPIRIN EC 325 MG PO TBEC
325.0000 mg | DELAYED_RELEASE_TABLET | Freq: Every day | ORAL | Status: DC
  Administered 2021-03-19 – 2021-03-20 (×2): 325 mg via ORAL
  Filled 2021-03-18 (×2): qty 1

## 2021-03-18 MED ORDER — HYDROCHLOROTHIAZIDE 25 MG PO TABS
25.0000 mg | ORAL_TABLET | Freq: Every day | ORAL | Status: DC
  Administered 2021-03-19 – 2021-03-20 (×2): 25 mg via ORAL
  Filled 2021-03-18 (×3): qty 1

## 2021-03-18 MED ORDER — KETOTIFEN FUMARATE 0.025 % OP SOLN
1.0000 [drp] | Freq: Every day | OPHTHALMIC | Status: DC | PRN
  Filled 2021-03-18: qty 5

## 2021-03-18 MED ORDER — METHOCARBAMOL 1000 MG/10ML IJ SOLN
500.0000 mg | Freq: Four times a day (QID) | INTRAVENOUS | Status: DC | PRN
  Filled 2021-03-18: qty 5

## 2021-03-18 MED ORDER — PREGABALIN 50 MG PO CAPS
50.0000 mg | ORAL_CAPSULE | Freq: Once | ORAL | Status: AC
  Administered 2021-03-18: 50 mg via ORAL
  Filled 2021-03-18: qty 1

## 2021-03-18 MED ORDER — ONDANSETRON HCL 4 MG/2ML IJ SOLN: 4.0000 mg | Freq: Four times a day (QID) | INTRAMUSCULAR | Status: DC | PRN

## 2021-03-18 MED ORDER — ONDANSETRON HCL 4 MG PO TABS
4.0000 mg | ORAL_TABLET | Freq: Four times a day (QID) | ORAL | Status: DC | PRN
  Administered 2021-03-20: 4 mg via ORAL
  Filled 2021-03-18: qty 1

## 2021-03-18 MED ORDER — FENTANYL CITRATE (PF) 100 MCG/2ML IJ SOLN
INTRAMUSCULAR | Status: AC
  Filled 2021-03-18: qty 2

## 2021-03-18 MED ORDER — METOCLOPRAMIDE HCL 5 MG/ML IJ SOLN: 5.0000 mg | Freq: Three times a day (TID) | INTRAMUSCULAR | Status: DC | PRN

## 2021-03-18 MED ORDER — HYDROCODONE-ACETAMINOPHEN 7.5-325 MG PO TABS: 1.0000 | ORAL_TABLET | ORAL | Status: DC | PRN

## 2021-03-18 MED ORDER — BUPIVACAINE-MELOXICAM ER 400-12 MG/14ML IJ SOLN
400.0000 mg | Freq: Once | INTRAMUSCULAR | Status: AC
  Filled 2021-03-18: qty 1

## 2021-03-18 MED ORDER — BUPIVACAINE HCL (PF) 0.5 % IJ SOLN
INTRAMUSCULAR | Status: DC | PRN
  Administered 2021-03-18: 2.5 mL

## 2021-03-18 MED ORDER — MIDAZOLAM HCL 2 MG/2ML IJ SOLN
INTRAMUSCULAR | Status: AC
  Filled 2021-03-18: qty 2

## 2021-03-18 MED ORDER — EPHEDRINE SULFATE 50 MG/ML IJ SOLN
INTRAMUSCULAR | Status: DC | PRN
  Administered 2021-03-18: 10 mg via INTRAVENOUS
  Administered 2021-03-18: 5 mg via INTRAVENOUS
  Administered 2021-03-18: 10 mg via INTRAVENOUS

## 2021-03-18 MED ORDER — METOCLOPRAMIDE HCL 10 MG PO TABS: 5.0000 mg | ORAL_TABLET | Freq: Three times a day (TID) | ORAL | Status: DC | PRN

## 2021-03-18 MED ORDER — ROSUVASTATIN CALCIUM 10 MG PO TABS
5.0000 mg | ORAL_TABLET | Freq: Every day | ORAL | Status: DC
  Administered 2021-03-19 – 2021-03-20 (×2): 5 mg via ORAL
  Filled 2021-03-18 (×2): qty 1

## 2021-03-18 MED ORDER — PANTOPRAZOLE SODIUM 40 MG PO TBEC
40.0000 mg | DELAYED_RELEASE_TABLET | Freq: Every day | ORAL | Status: DC
  Administered 2021-03-18 – 2021-03-20 (×3): 40 mg via ORAL
  Filled 2021-03-18 (×3): qty 1

## 2021-03-18 MED ORDER — 0.9 % SODIUM CHLORIDE (POUR BTL) OPTIME
TOPICAL | Status: DC | PRN
  Administered 2021-03-18: 1000 mL

## 2021-03-18 MED ORDER — PROPOFOL 500 MG/50ML IV EMUL
INTRAVENOUS | Status: DC | PRN
  Administered 2021-03-18: 50 ug/kg/min via INTRAVENOUS

## 2021-03-18 MED ORDER — ORAL CARE MOUTH RINSE: 15.0000 mL | Freq: Once | OROMUCOSAL | Status: AC

## 2021-03-18 MED ORDER — VANCOMYCIN HCL IN DEXTROSE 1-5 GM/200ML-% IV SOLN
INTRAVENOUS | Status: AC
  Filled 2021-03-18: qty 200

## 2021-03-18 MED ORDER — ROPIVACAINE HCL 5 MG/ML IJ SOLN
INTRAMUSCULAR | Status: DC | PRN
  Administered 2021-03-18: 30 mL via EPIDURAL

## 2021-03-18 MED ORDER — PHENYLEPHRINE HCL (PRESSORS) 10 MG/ML IV SOLN
INTRAVENOUS | Status: DC | PRN
  Administered 2021-03-18 (×7): 80 ug via INTRAVENOUS

## 2021-03-18 MED ORDER — MORPHINE SULFATE (PF) 2 MG/ML IV SOLN
0.5000 mg | INTRAVENOUS | Status: DC | PRN
  Administered 2021-03-18 – 2021-03-20 (×10): 1 mg via INTRAVENOUS
  Filled 2021-03-18 (×10): qty 1

## 2021-03-18 MED ORDER — LACTATED RINGERS IV SOLN
INTRAVENOUS | Status: DC
  Administered 2021-03-18: 1000 mL via INTRAVENOUS

## 2021-03-18 MED ORDER — PROPOFOL 10 MG/ML IV BOLUS
INTRAVENOUS | Status: AC
  Filled 2021-03-18: qty 20

## 2021-03-18 MED ORDER — ALUM & MAG HYDROXIDE-SIMETH 200-200-20 MG/5ML PO SUSP: 30.0000 mL | ORAL | Status: DC | PRN

## 2021-03-18 MED ORDER — OXYCODONE HCL 5 MG PO TABS
5.0000 mg | ORAL_TABLET | Freq: Once | ORAL | Status: AC
  Administered 2021-03-18: 5 mg via ORAL
  Filled 2021-03-18: qty 1

## 2021-03-18 MED ORDER — TRANEXAMIC ACID-NACL 1000-0.7 MG/100ML-% IV SOLN
1000.0000 mg | INTRAVENOUS | Status: AC
  Administered 2021-03-18: 1000 mg via INTRAVENOUS

## 2021-03-18 MED ORDER — PHENTERMINE HCL 37.5 MG PO CAPS: 37.5000 mg | ORAL_CAPSULE | Freq: Every day | ORAL | Status: DC

## 2021-03-18 MED ORDER — SODIUM CHLORIDE 0.9 % IV SOLN: INTRAVENOUS | Status: AC

## 2021-03-18 MED ORDER — DEXMEDETOMIDINE (PRECEDEX) IN NS 20 MCG/5ML (4 MCG/ML) IV SYRINGE
PREFILLED_SYRINGE | INTRAVENOUS | Status: DC | PRN
  Administered 2021-03-18: 20 ug via INTRAVENOUS

## 2021-03-18 MED ORDER — LIDOCAINE HCL (CARDIAC) PF 50 MG/5ML IV SOSY
PREFILLED_SYRINGE | INTRAVENOUS | Status: DC | PRN
  Administered 2021-03-18: 1 mL via INTRAVENOUS

## 2021-03-18 MED ORDER — METHOCARBAMOL 500 MG PO TABS
500.0000 mg | ORAL_TABLET | Freq: Four times a day (QID) | ORAL | Status: DC | PRN
  Administered 2021-03-18 – 2021-03-20 (×4): 500 mg via ORAL
  Filled 2021-03-18 (×4): qty 1

## 2021-03-18 MED ORDER — TRANEXAMIC ACID-NACL 1000-0.7 MG/100ML-% IV SOLN
INTRAVENOUS | Status: AC
  Filled 2021-03-18: qty 100

## 2021-03-18 MED ORDER — VANCOMYCIN HCL IN DEXTROSE 1-5 GM/200ML-% IV SOLN
1000.0000 mg | INTRAVENOUS | Status: AC
  Administered 2021-03-18: 1000 mg via INTRAVENOUS

## 2021-03-18 MED ORDER — FENTANYL CITRATE PF 50 MCG/ML IJ SOSY
25.0000 ug | PREFILLED_SYRINGE | INTRAMUSCULAR | Status: DC | PRN
  Administered 2021-03-18: 25 ug via INTRAVENOUS

## 2021-03-18 MED ORDER — EPHEDRINE 5 MG/ML INJ
INTRAVENOUS | Status: AC
  Filled 2021-03-18: qty 5

## 2021-03-18 MED ORDER — DOCUSATE SODIUM 100 MG PO CAPS
100.0000 mg | ORAL_CAPSULE | Freq: Two times a day (BID) | ORAL | Status: DC
  Administered 2021-03-18 – 2021-03-20 (×4): 100 mg via ORAL
  Filled 2021-03-18 (×4): qty 1

## 2021-03-18 MED ORDER — MENTHOL 3 MG MT LOZG: 1.0000 | LOZENGE | OROMUCOSAL | Status: DC | PRN

## 2021-03-18 MED ORDER — POLYETHYLENE GLYCOL 3350 17 G PO PACK
17.0000 g | PACK | Freq: Every day | ORAL | Status: DC
  Administered 2021-03-18 – 2021-03-20 (×3): 17 g via ORAL
  Filled 2021-03-18 (×3): qty 1

## 2021-03-18 MED ORDER — PHENOL 1.4 % MT LIQD: 1.0000 | OROMUCOSAL | Status: DC | PRN

## 2021-03-18 MED ORDER — ACETAMINOPHEN 500 MG PO TABS
500.0000 mg | ORAL_TABLET | Freq: Four times a day (QID) | ORAL | Status: AC
  Administered 2021-03-18 – 2021-03-19 (×4): 500 mg via ORAL
  Filled 2021-03-18 (×4): qty 1

## 2021-03-18 MED ORDER — VASOPRESSIN 20 UNIT/ML IV SOLN
INTRAVENOUS | Status: DC | PRN
  Administered 2021-03-18 (×2): .5 [IU] via INTRAVENOUS
  Administered 2021-03-18: 1 [IU] via INTRAVENOUS
  Administered 2021-03-18: .5 [IU] via INTRAVENOUS

## 2021-03-18 MED ORDER — METHOCARBAMOL 1000 MG/10ML IJ SOLN
500.0000 mg | Freq: Once | INTRAVENOUS | Status: AC
  Administered 2021-03-18: 500 mg via INTRAVENOUS
  Filled 2021-03-18: qty 5

## 2021-03-18 MED ORDER — PANTOPRAZOLE SODIUM 40 MG PO TBEC: 40.0000 mg | DELAYED_RELEASE_TABLET | Freq: Every day | ORAL | Status: DC

## 2021-03-18 MED ORDER — CLONAZEPAM 0.5 MG PO TABS: 1.0000 mg | ORAL_TABLET | Freq: Three times a day (TID) | ORAL | Status: DC | PRN

## 2021-03-18 MED ORDER — MELATONIN 3 MG PO TABS: 9.0000 mg | ORAL_TABLET | Freq: Every evening | ORAL | Status: DC | PRN

## 2021-03-18 MED ORDER — BISACODYL 5 MG PO TBEC: 5.0000 mg | DELAYED_RELEASE_TABLET | Freq: Every day | ORAL | Status: DC | PRN

## 2021-03-18 MED ORDER — VASOPRESSIN 20 UNIT/ML IV SOLN
INTRAVENOUS | Status: AC
  Filled 2021-03-18: qty 1

## 2021-03-18 MED ORDER — TRANEXAMIC ACID-NACL 1000-0.7 MG/100ML-% IV SOLN
1000.0000 mg | Freq: Once | INTRAVENOUS | Status: AC
  Administered 2021-03-18: 1000 mg via INTRAVENOUS
  Filled 2021-03-18: qty 100

## 2021-03-18 MED ORDER — DEXAMETHASONE SODIUM PHOSPHATE 10 MG/ML IJ SOLN
10.0000 mg | Freq: Once | INTRAMUSCULAR | Status: AC
  Administered 2021-03-19: 10 mg via INTRAVENOUS
  Filled 2021-03-18: qty 1

## 2021-03-18 MED ORDER — LORATADINE 10 MG PO TABS
10.0000 mg | ORAL_TABLET | Freq: Every day | ORAL | Status: DC
  Administered 2021-03-19 – 2021-03-20 (×2): 10 mg via ORAL
  Filled 2021-03-18 (×2): qty 1

## 2021-03-18 MED ORDER — HYDROCODONE-ACETAMINOPHEN 5-325 MG PO TABS: 1.0000 | ORAL_TABLET | ORAL | Status: DC | PRN

## 2021-03-18 MED ORDER — ACETAMINOPHEN 325 MG PO TABS: 325.0000 mg | ORAL_TABLET | Freq: Four times a day (QID) | ORAL | Status: DC | PRN

## 2021-03-18 MED ORDER — BUPIVACAINE-MELOXICAM ER 200-6 MG/7ML IJ SOLN
INTRAMUSCULAR | Status: DC | PRN
  Administered 2021-03-18: 13.4 mL

## 2021-03-18 MED ORDER — TELMISARTAN-HCTZ 80-25 MG PO TABS: 1.0000 | ORAL_TABLET | Freq: Every day | ORAL | Status: DC

## 2021-03-18 SURGICAL SUPPLY — 58 items
ATTUNE PS FEM LT SZ 3 CEM KNEE (Femur) ×2 IMPLANT
BANDAGE ESMARK 6X9 LF (GAUZE/BANDAGES/DRESSINGS) ×1 IMPLANT
BASEPLATE TIB CMT FB PCKT SZ3 (Knees) ×2 IMPLANT
BLADE SAGITTAL 25.0X1.27X90 (BLADE) ×2 IMPLANT
BLADE SAW SAG 90X13X1.27 (BLADE) ×2 IMPLANT
BNDG ESMARK 6X9 LF (GAUZE/BANDAGES/DRESSINGS) ×2
CEMENT HV SMART SET (Cement) ×4 IMPLANT
CLOTH BEACON ORANGE TIMEOUT ST (SAFETY) ×2 IMPLANT
COOLER ICEMAN CLASSIC (MISCELLANEOUS) ×2 IMPLANT
COVER LIGHT HANDLE STERIS (MISCELLANEOUS) ×4 IMPLANT
CUFF TOURN SGL QUICK 34 (TOURNIQUET CUFF) ×2
CUFF TRNQT CYL 34X4.125X (TOURNIQUET CUFF) ×1 IMPLANT
DRAPE BACK TABLE (DRAPES) ×2 IMPLANT
DRAPE EXTREMITY T 121X128X90 (DISPOSABLE) ×2 IMPLANT
DRAPE INCISE IOBAN 66X45 STRL (DRAPES) ×2 IMPLANT
DRESSING AQUACEL AG ADV 3.5X12 (MISCELLANEOUS) ×1 IMPLANT
DRSG AQUACEL AG ADV 3.5X12 (MISCELLANEOUS) ×2
DURAPREP 26ML APPLICATOR (WOUND CARE) ×4 IMPLANT
ELECT REM PT RETURN 9FT ADLT (ELECTROSURGICAL) ×2
ELECTRODE REM PT RTRN 9FT ADLT (ELECTROSURGICAL) ×1 IMPLANT
GLOVE SS N UNI LF 8.5 STRL (GLOVE) ×2 IMPLANT
GLOVE SURG LTX SZ7 (GLOVE) ×6 IMPLANT
GLOVE SURG LTX SZ8 (GLOVE) ×4 IMPLANT
GLOVE SURG POLYISO LF SZ8 (GLOVE) ×4 IMPLANT
GLOVE SURG UNDER POLY LF SZ7 (GLOVE) ×10 IMPLANT
GLOVE SURG UNDER POLY LF SZ7.5 (GLOVE) ×4 IMPLANT
GOWN STRL REUS W/TWL LRG LVL3 (GOWN DISPOSABLE) ×6 IMPLANT
GOWN STRL REUS W/TWL XL LVL3 (GOWN DISPOSABLE) ×2 IMPLANT
HANDPIECE INTERPULSE COAX TIP (DISPOSABLE) ×2
HOOD W/PEELAWAY (MISCELLANEOUS) ×8 IMPLANT
INSERT TIB ATTUNE FB SZ3X7 (Insert) ×2 IMPLANT
INST SET MAJOR BONE (KITS) ×2 IMPLANT
IV NS IRRIG 3000ML ARTHROMATIC (IV SOLUTION) ×2 IMPLANT
KIT BLADEGUARD II DBL (SET/KITS/TRAYS/PACK) ×2 IMPLANT
KIT TURNOVER KIT A (KITS) ×2 IMPLANT
MANIFOLD NEPTUNE II (INSTRUMENTS) ×2 IMPLANT
MARKER SKIN DUAL TIP RULER LAB (MISCELLANEOUS) ×2 IMPLANT
NS IRRIG 1000ML POUR BTL (IV SOLUTION) ×2 IMPLANT
PACK TOTAL JOINT (CUSTOM PROCEDURE TRAY) ×2 IMPLANT
PAD ARMBOARD 7.5X6 YLW CONV (MISCELLANEOUS) ×2 IMPLANT
PAD COLD SHLDR WRAP-ON (PAD) ×2 IMPLANT
PENCIL SMOKE EVACUATOR (MISCELLANEOUS) ×2 IMPLANT
PILLOW KNEE EXTENSION 0 DEG (MISCELLANEOUS) ×2 IMPLANT
SAW OSC TIP CART 19.5X105X1.3 (SAW) ×2 IMPLANT
SET BASIN LINEN APH (SET/KITS/TRAYS/PACK) ×2 IMPLANT
SET HNDPC FAN SPRY TIP SCT (DISPOSABLE) ×1 IMPLANT
SPONGE T-LAP 18X18 ~~LOC~~+RFID (SPONGE) ×6 IMPLANT
STAPLER VISISTAT 35W (STAPLE) ×2 IMPLANT
SUT BRALON NAB BRD #1 30IN (SUTURE) ×4 IMPLANT
SUT MNCRL 0 VIOLET CTX 36 (SUTURE) ×1 IMPLANT
SUT MON AB 0 CT1 (SUTURE) ×2 IMPLANT
SUT MONOCRYL 0 CTX 36 (SUTURE) ×2
SYR BULB IRRIG 60ML STRL (SYRINGE) ×2 IMPLANT
TOWEL OR 17X26 4PK STRL BLUE (TOWEL DISPOSABLE) ×2 IMPLANT
TOWER CARTRIDGE SMART MIX (DISPOSABLE) ×2 IMPLANT
TRAY FOLEY MTR SLVR 16FR STAT (SET/KITS/TRAYS/PACK) ×2 IMPLANT
WATER STERILE IRR 1000ML POUR (IV SOLUTION) ×4 IMPLANT
YANKAUER SUCT 12FT TUBE ARGYLE (SUCTIONS) ×2 IMPLANT

## 2021-03-18 NOTE — Interval H&P Note (Signed)
History and Physical Interval Note:  03/18/2021 10:44 AM  Rachel [name]  has presented today for surgery, with the diagnosis of left knee osteoarthritis.  The various methods of treatment have been discussed with the patient and family. After consideration of risks, benefits and other options for treatment, the patient has consented to  Procedure(s): TOTAL KNEE ARTHROPLASTY (Left) as a surgical intervention.  The patient's history has been reviewed, patient examined, no change in status, stable for surgery.  I have reviewed the patient's chart and labs.  Questions were answered to the patient's satisfaction.     Arther Abbott

## 2021-03-18 NOTE — Anesthesia Preprocedure Evaluation (Addendum)
Anesthesia Evaluation  Patient identified by MRN, date of birth, ID band Patient awake    Reviewed: Allergy & Precautions, H&P , NPO status , Patient's Chart, lab work & pertinent test results, reviewed documented beta blocker date and time   Airway Mallampati: II  TM Distance: >3 FB Neck ROM: full    Dental no notable dental hx.    Pulmonary sleep apnea , COPD, former smoker,    Pulmonary exam normal breath sounds clear to auscultation       Cardiovascular Exercise Tolerance: Good hypertension, negative cardio ROS   Rhythm:regular Rate:Normal     Neuro/Psych PSYCHIATRIC DISORDERS Anxiety Depression Bipolar Disorder negative neurological ROS     GI/Hepatic Neg liver ROS, GERD  Medicated,  Endo/Other  Morbid obesity  Renal/GU negative Renal ROS  negative genitourinary   Musculoskeletal   Abdominal   Peds  Hematology  (+) Blood dyscrasia, anemia ,   Anesthesia Other Findings   Reproductive/Obstetrics negative OB ROS                             Anesthesia Physical Anesthesia Plan  ASA: 3  Anesthesia Plan: Spinal   Post-op Pain Management:  Regional for Post-op pain   Induction:   PONV Risk Score and Plan: Propofol infusion  Airway Management Planned:   Additional Equipment:   Intra-op Plan:   Post-operative Plan:   Informed Consent: I have reviewed the patients History and Physical, chart, labs and discussed the procedure including the risks, benefits and alternatives for the proposed anesthesia with the patient or authorized representative who has indicated his/her understanding and acceptance.     Dental Advisory Given  Plan Discussed with: CRNA  Anesthesia Plan Comments:        Anesthesia Quick Evaluation

## 2021-03-18 NOTE — Op Note (Signed)
03/18/2021  1:22 PM  PATIENT:  Rachel [name]  60 y.o. female  PRE-OPERATIVE DIAGNOSIS:  left knee osteoarthritis  POST-OPERATIVE DIAGNOSIS:  left knee osteoarthritis  PROCEDURE:  Procedure(s): TOTAL KNEE ARTHROPLASTY (Left)  Findings at surgery the most notable finding other than the arthritis was that the patella itself was normal  The medial compartment was completely arthritic grade 4 changes osteophytes and loose bodies were found throughout the joint there is a varus deformity there is no flexion contracture  Implants used attune fixed-bearing posterior stabilized total knee  Size 3 femur size 3 tibia size 7 polyethylene insert  Distal femoral cut 9 proximal tibial cut 3 referencing from the medial side no patella fixation  Details of surgery.  Standard preop evaluation was performed patient was taken to the operating room with the left knee marked as the surgical site chart review completed implants checked and radiographs reviewed  In the operating room she had spinal anesthetic she was in the supine position.  Foley catheter was inserted.  Left leg was prepped and draped sterilely  There was no preop flexion contracture  Preoperative passive flexion test was 105 degrees.  After timeout the limb was exsanguinated with a six-inch Esmarch the incision was made with the knee in flexion the subcutaneous tissue was divided down to the extensor mechanism and a medial arthrotomy was performed.  Fat pad was excised.  Medial soft tissue sleeve was elevated to the mid coronal plane excising the medial meniscus.  A pocket was made for the lateral retractor and the anterior horn the lateral meniscus was resected along with the ACL and PCL.  The patellofemoral ligament was released  3/8 inch drill bit was introduced into the femoral canal which was suctioned and irrigated.  The cutting jig for the femur was set for 5 degree left 9 mm distal cut.  Block was pinned cuts were made.  The  tibia was addressed next  We referenced the medial side at 3 mm we used the medial third of the tibial tubercle of the tibial spines the medial mall lateral malleolus and the tibial shaft to obtain alignment aiming for a neutral tibial cut.  Once this cut was performed extension blocks were used to check the extension gap.  The medial side was tight and was released until a 7 mm block was able to be introduced into the extension gap which gave good stability  The femur was then sized to a size 3 pinned in place and the anterior and posterior cuts were performed.  The 7 mm spacer block was then introduced to confirm the flexion gap stability using a rotation test.  This gave good stability and the additional 2 cuts were made  Flexion-extension gaps were checked with submillimeter blocks and it was equal  We then cut to the notch with the 3 notch cutting guide for the femur we then did a trial reduction drilled the peg holes for the femur did a proximal tibial preparation  Trial reduction confirmed 7 mm spacer blocks and insert would give good stability and equal flexion extension gaps  After thorough irrigation I decided that the patella did not need resurfacing due to there is excellent cartilage and no erosion  We then cemented the implants in place allowed the cement to cure removed excess cement and then with the 3 femur 3 tibia and 7 mm spacer block in place we checked the passive flexion test which was 110 with 0 degrees extension  We irrigated  dried the wound inserted the Zynrelef and then closed the capsule interrupted fashion with #1 Braylon suture subcutaneous tissue closed in 2 layers with 0 Monocryl suture 1 interrupted 1 running  Staples were used to reapproximate the skin edges.  Sterile dressing Cryo/Cuff and TED hose stocking were applied  Patient was taken to the recovery room for postop saphenous nerve block  Routine postop protocol otherwise  SURGEON:  Surgeon(s) and  Role:    * Carole Civil, MD - Primary  PHYSICIAN ASSISTANT:   ASSISTANTS: Corrie Dandy and Angie  ANESTHESIA:   spinal plus postop saphenous nerve block  EBL:  5 mL   BLOOD ADMINISTERED:none  DRAINS: none   LOCAL MEDICATIONS USED:  OTHER Zynrelef 400  SPECIMEN:  No Specimen  DISPOSITION OF SPECIMEN:  N/A  COUNTS:  YES  TOURNIQUET:   Total Tourniquet Time Documented: Thigh (Left) - 96 minutes Total: Thigh (Left) - 96 minutes   DICTATION: .Viviann Spare Dictation  PLAN OF CARE: Admit for overnight observation  PATIENT DISPOSITION:  PACU - hemodynamically stable.   Delay start of Pharmacological VTE agent (>24hrs) due to surgical blood loss or risk of bleeding: yes

## 2021-03-18 NOTE — Anesthesia Procedure Notes (Signed)
Spinal  Patient location during procedure: OR Start time: 03/18/2021 10:57 AM End time: 03/18/2021 11:00 AM Reason for block: surgical anesthesia Staffing Performed: resident/CRNA  Resident/CRNA: Tacy Learn, CRNA Preanesthetic Checklist Completed: patient identified, IV checked, site marked, risks and benefits discussed, surgical consent, monitors and equipment checked, pre-op evaluation and timeout performed Spinal Block Patient position: sitting Prep: ChloraPrep Patient monitoring: heart rate, cardiac monitor, continuous pulse ox and blood pressure Approach: midline Location: L4-5 Injection technique: single-shot Needle Needle type: Quincke  Needle gauge: 22 G Needle length: 9 cm Assessment Sensory level: T4 Events: CSF return Additional Notes Pt tolerated well

## 2021-03-18 NOTE — Brief Op Note (Signed)
03/18/2021  1:15 PM  PATIENT:  Rachel [name]  60 y.o. female  PRE-OPERATIVE DIAGNOSIS:  left knee osteoarthritis  POST-OPERATIVE DIAGNOSIS:  left knee osteoarthritis  PROCEDURE:  Procedure(s): TOTAL KNEE ARTHROPLASTY (Left)  Findings at surgery the most notable finding other than the arthritis was that the patella itself was normal  The medial compartment was completely arthritic grade 4 changes osteophytes and loose bodies were found throughout the joint there is a varus deformity there is no flexion contracture  Implants used attune fixed-bearing posterior stabilized total knee  Size 3 femur size 3 tibia size 7 polyethylene insert  Distal femoral cut 9 proximal tibial cut 3 referencing from the medial side no patella fixation  Details of surgery.  Standard preop evaluation was performed patient was taken to the operating room with the left knee marked as the surgical site chart review completed implants checked and radiographs reviewed  In the operating room she had spinal anesthetic she was in the supine position.  Foley catheter was inserted.  Left leg was prepped and draped sterilely  There was no preop flexion contracture  Preoperative passive flexion test was 105 degrees.  After timeout the limb was exsanguinated with a six-inch Esmarch the incision was made with the knee in flexion the subcutaneous tissue was divided down to the extensor mechanism and a medial arthrotomy was performed.  Fat pad was excised.  Medial soft tissue sleeve was elevated to the mid coronal plane excising the medial meniscus.  A pocket was made for the lateral retractor and the anterior horn the lateral meniscus was resected along with the ACL and PCL.  The patellofemoral ligament was released  3/8 inch drill bit was introduced into the femoral canal which was suctioned and irrigated.  The cutting jig for the femur was set for 5 degree left 9 mm distal cut.  Block was pinned cuts were made.  The  tibia was addressed next  We referenced the medial side at 3 mm we used the medial third of the tibial tubercle of the tibial spines the medial mall lateral malleolus and the tibial shaft to obtain alignment aiming for a neutral tibial cut.  Once this cut was performed extension blocks were used to check the extension gap.  The medial side was tight and was released until a 7 mm block was able to be introduced into the extension gap which gave good stability  The femur was then sized to a size 3 pinned in place and the anterior and posterior cuts were performed.  The 7 mm spacer block was then introduced to confirm the flexion gap stability using a rotation test.  This gave good stability and the additional 2 cuts were made  Flexion-extension gaps were checked with submillimeter blocks and it was equal  We then cut to the notch with the 3 notch cutting guide for the femur we then did a trial reduction drilled the peg holes for the femur did a proximal tibial preparation  Trial reduction confirmed 7 mm spacer blocks and insert would give good stability and equal flexion extension gaps  After thorough irrigation I decided that the patella did not need resurfacing due to there is excellent cartilage and no erosion  We then cemented the implants in place allowed the cement to cure removed excess cement and then with the 3 femur 3 tibia and 7 mm spacer block in place we checked the passive flexion test which was 110 with 0 degrees extension  We irrigated  dried the wound inserted the Zynrelef and then closed the capsule interrupted fashion with #1 Braylon suture subcutaneous tissue closed in 2 layers with 0 Monocryl suture 1 interrupted 1 running  Staples were used to reapproximate the skin edges.  Sterile dressing Cryo/Cuff and TED hose stocking were applied  Patient was taken to the recovery room for postop saphenous nerve block  Routine postop protocol otherwise  SURGEON:  Surgeon(s) and  Role:    * Carole Civil, MD - Primary  PHYSICIAN ASSISTANT:   ASSISTANTS: Corrie Dandy and Angie  ANESTHESIA:   spinal plus postop saphenous nerve block  EBL:  5 mL   BLOOD ADMINISTERED:none  DRAINS: none   LOCAL MEDICATIONS USED:  OTHER Zynrelef 400  SPECIMEN:  No Specimen  DISPOSITION OF SPECIMEN:  N/A  COUNTS:  YES  TOURNIQUET:   Total Tourniquet Time Documented: Thigh (Left) - 96 minutes Total: Thigh (Left) - 96 minutes   DICTATION: .Viviann Spare Dictation  PLAN OF CARE: Admit for overnight observation  PATIENT DISPOSITION:  PACU - hemodynamically stable.   Delay start of Pharmacological VTE agent (>24hrs) due to surgical blood loss or risk of bleeding: yes

## 2021-03-18 NOTE — Anesthesia Postprocedure Evaluation (Signed)
Anesthesia Post Note  Patient: Rachel [name]  Procedure(s) Performed: TOTAL KNEE ARTHROPLASTY (Left: Knee)  Patient location during evaluation: Phase II Anesthesia Type: General Level of consciousness: awake Pain management: pain level controlled Vital Signs Assessment: post-procedure vital signs reviewed and stable Respiratory status: spontaneous breathing and respiratory function stable Cardiovascular status: blood pressure returned to baseline and stable Postop Assessment: no headache and no apparent nausea or vomiting Anesthetic complications: no Comments: Late entry   No notable events documented.   Last Vitals:  Vitals:   03/18/21 1445 03/18/21 1500  BP: 124/79 120/75  Pulse: 86 82  Resp: 14 14  Temp:    SpO2: 99% 98%    Last Pain:  Vitals:   03/18/21 1448  TempSrc:   PainSc: [place]

## 2021-03-18 NOTE — Transfer of Care (Signed)
Immediate Anesthesia Transfer of Care Note  Patient: Rachel [name]  Procedure(s) Performed: TOTAL KNEE ARTHROPLASTY (Left: Knee)  Patient Location: PACU  Anesthesia Type:Spinal  Level of Consciousness: awake, alert , oriented, patient cooperative and responds to stimulation  Airway & Oxygen Therapy: Patient Spontanous Breathing and Patient connected to face mask oxygen  Post-op Assessment: Report given to RN, Post -op Vital signs reviewed and stable and Patient moving all extremities X 4  Post vital signs: Reviewed and stable  Last Vitals:  Vitals Value Taken Time  BP 112/59 03/18/21 1330  Temp    Pulse 96 03/18/21 1333  Resp 19 03/18/21 1333  SpO2 98 % 03/18/21 1333  Vitals shown include unvalidated device data.  Last Pain:  Vitals:   03/18/21 1320  TempSrc:   PainSc: 0-No pain         Complications: No notable events documented.

## 2021-03-18 NOTE — Anesthesia Procedure Notes (Signed)
Anesthesia Regional Block: Adductor canal block   Pre-Anesthetic Checklist: , timeout performed,  Correct Patient, Correct Site, Correct Laterality,  Correct Procedure, Correct Position, site marked,  Risks and benefits discussed,  Surgical consent,  Pre-op evaluation,  At surgeon's request and post-op pain management  Laterality: Left  Prep: chloraprep       Needles:  Injection technique: Single-shot  Needle Type: Stimiplex     Needle Length: 15cm  Needle Gauge: 20     Additional Needles:   Narrative:  Start time: 03/18/2021 1:28 PM End time: 03/18/2021 1:30 PM Injection made incrementally with aspirations every 5 mL.  Performed by: With CRNAs  Anesthesiologist: Louann Sjogren, MD CRNA: Tacy Learn, CRNA

## 2021-03-18 NOTE — Brief Op Note (Signed)
03/18/2021  1:22 PM  PATIENT:  Rachel [name]  60 y.o. female  PRE-OPERATIVE DIAGNOSIS:  left knee osteoarthritis  POST-OPERATIVE DIAGNOSIS:  left knee osteoarthritis  PROCEDURE:  Procedure(s): TOTAL KNEE ARTHROPLASTY (Left)  Findings at surgery the most notable finding other than the arthritis was that the patella itself was normal  The medial compartment was completely arthritic grade 4 changes osteophytes and loose bodies were found throughout the joint there is a varus deformity there is no flexion contracture  Implants used attune fixed-bearing posterior stabilized total knee  Size 3 femur size 3 tibia size 7 polyethylene insert  Distal femoral cut 9 proximal tibial cut 3 referencing from the medial side no patella fixation  Details of surgery.  Standard preop evaluation was performed patient was taken to the operating room with the left knee marked as the surgical site chart review completed implants checked and radiographs reviewed  In the operating room she had spinal anesthetic she was in the supine position.  Foley catheter was inserted.  Left leg was prepped and draped sterilely  There was no preop flexion contracture  Preoperative passive flexion test was 105 degrees.  After timeout the limb was exsanguinated with a six-inch Esmarch the incision was made with the knee in flexion the subcutaneous tissue was divided down to the extensor mechanism and a medial arthrotomy was performed.  Fat pad was excised.  Medial soft tissue sleeve was elevated to the mid coronal plane excising the medial meniscus.  A pocket was made for the lateral retractor and the anterior horn the lateral meniscus was resected along with the ACL and PCL.  The patellofemoral ligament was released  3/8 inch drill bit was introduced into the femoral canal which was suctioned and irrigated.  The cutting jig for the femur was set for 5 degree left 9 mm distal cut.  Block was pinned cuts were made.  The  tibia was addressed next  We referenced the medial side at 3 mm we used the medial third of the tibial tubercle of the tibial spines the medial mall lateral malleolus and the tibial shaft to obtain alignment aiming for a neutral tibial cut.  Once this cut was performed extension blocks were used to check the extension gap.  The medial side was tight and was released until a 7 mm block was able to be introduced into the extension gap which gave good stability  The femur was then sized to a size 3 pinned in place and the anterior and posterior cuts were performed.  The 7 mm spacer block was then introduced to confirm the flexion gap stability using a rotation test.  This gave good stability and the additional 2 cuts were made  Flexion-extension gaps were checked with submillimeter blocks and it was equal  We then cut to the notch with the 3 notch cutting guide for the femur we then did a trial reduction drilled the peg holes for the femur did a proximal tibial preparation  Trial reduction confirmed 7 mm spacer blocks and insert would give good stability and equal flexion extension gaps  After thorough irrigation I decided that the patella did not need resurfacing due to there is excellent cartilage and no erosion  We then cemented the implants in place allowed the cement to cure removed excess cement and then with the 3 femur 3 tibia and 7 mm spacer block in place we checked the passive flexion test which was 110 with 0 degrees extension  We irrigated  dried the wound inserted the Zynrelef and then closed the capsule interrupted fashion with #1 Braylon suture subcutaneous tissue closed in 2 layers with 0 Monocryl suture 1 interrupted 1 running  Staples were used to reapproximate the skin edges.  Sterile dressing Cryo/Cuff and TED hose stocking were applied  Patient was taken to the recovery room for postop saphenous nerve block  Routine postop protocol otherwise  SURGEON:  Surgeon(s) and  Role:    * Carole Civil, MD - Primary  PHYSICIAN ASSISTANT:   ASSISTANTS: Corrie Dandy and Angie  ANESTHESIA:   spinal plus postop saphenous nerve block  EBL:  5 mL   BLOOD ADMINISTERED:none  DRAINS: none   LOCAL MEDICATIONS USED:  OTHER Zynrelef 400  SPECIMEN:  No Specimen  DISPOSITION OF SPECIMEN:  N/A  COUNTS:  YES  TOURNIQUET:   Total Tourniquet Time Documented: Thigh (Left) - 96 minutes Total: Thigh (Left) - 96 minutes   DICTATION: .Viviann Spare Dictation  PLAN OF CARE: Admit for overnight observation  PATIENT DISPOSITION:  PACU - hemodynamically stable.   Delay start of Pharmacological VTE agent (>24hrs) due to surgical blood loss or risk of bleeding: yes

## 2021-03-19 DIAGNOSIS — Z961 Presence of intraocular lens: Secondary | ICD-10-CM | POA: Diagnosis present

## 2021-03-19 DIAGNOSIS — Z9841 Cataract extraction status, right eye: Secondary | ICD-10-CM | POA: Diagnosis not present

## 2021-03-19 DIAGNOSIS — I10 Essential (primary) hypertension: Secondary | ICD-10-CM | POA: Diagnosis present

## 2021-03-19 DIAGNOSIS — F419 Anxiety disorder, unspecified: Secondary | ICD-10-CM | POA: Diagnosis present

## 2021-03-19 DIAGNOSIS — Z79899 Other long term (current) drug therapy: Secondary | ICD-10-CM | POA: Diagnosis not present

## 2021-03-19 DIAGNOSIS — F32A Depression, unspecified: Secondary | ICD-10-CM | POA: Diagnosis present

## 2021-03-19 DIAGNOSIS — Z20822 Contact with and (suspected) exposure to covid-19: Secondary | ICD-10-CM | POA: Diagnosis present

## 2021-03-19 DIAGNOSIS — E785 Hyperlipidemia, unspecified: Secondary | ICD-10-CM | POA: Diagnosis present

## 2021-03-19 DIAGNOSIS — Z791 Long term (current) use of non-steroidal anti-inflammatories (NSAID): Secondary | ICD-10-CM | POA: Diagnosis not present

## 2021-03-19 DIAGNOSIS — Z823 Family history of stroke: Secondary | ICD-10-CM | POA: Diagnosis not present

## 2021-03-19 DIAGNOSIS — K219 Gastro-esophageal reflux disease without esophagitis: Secondary | ICD-10-CM | POA: Diagnosis present

## 2021-03-19 DIAGNOSIS — Z87891 Personal history of nicotine dependence: Secondary | ICD-10-CM | POA: Diagnosis not present

## 2021-03-19 DIAGNOSIS — M1712 Unilateral primary osteoarthritis, left knee: Secondary | ICD-10-CM | POA: Diagnosis present

## 2021-03-19 DIAGNOSIS — G4730 Sleep apnea, unspecified: Secondary | ICD-10-CM | POA: Diagnosis present

## 2021-03-19 DIAGNOSIS — Z6839 Body mass index (BMI) 39.0-39.9, adult: Secondary | ICD-10-CM | POA: Diagnosis not present

## 2021-03-19 DIAGNOSIS — Z8249 Family history of ischemic heart disease and other diseases of the circulatory system: Secondary | ICD-10-CM | POA: Diagnosis not present

## 2021-03-19 NOTE — Plan of Care (Signed)
  Problem: Acute Rehab PT Goals(only PT should resolve) Goal: Pt Will Go Supine/Side To Sit Outcome: Progressing Flowsheets (Taken 03/19/2021 0953) Pt will go Supine/Side to Sit: with modified independence Goal: Patient Will Transfer Sit To/From Stand Outcome: Progressing Flowsheets (Taken 03/19/2021 [phone]) Patient will transfer sit to/from stand:  with modified independence  with supervision Goal: Pt Will Transfer Bed To Chair/Chair To Bed Outcome: Progressing Flowsheets (Taken 03/19/2021 0953) Pt will Transfer Bed to Chair/Chair to Bed:  with modified independence  with supervision Goal: Pt Will Ambulate Outcome: Progressing Flowsheets (Taken 03/19/2021 0953) Pt will Ambulate:  100 feet  with modified independence  with rolling walker  with supervision   9:54 AM, 03/19/21 Lonell Grandchild, MPT Physical Therapist with [facility] [phone] office [phone] mobile phone

## 2021-03-19 NOTE — Progress Notes (Signed)
Patient ID: Rachel [name], female   DOB: Mar 24, 1961, 60 y.o.   MRN: [id number] POD # 1   LEFT TKA  C/O PAIN MILD NAUSEA   BP 117/70 (BP Location: Left Arm)   Pulse 89   Temp 98.5 F (36.9 C) (Oral)   Resp 20   SpO2 94%   Majel has scant to moderate drainage from the left knee.  She was intolerant of the TED hose  Complains of swelling and tightness around the left knee  Neurovascular exam remains intact  CBC Latest Ref Rng & Units 03/13/2021 06/07/2018 10/11/2017  WBC 4.0 - 10.5 K/uL 4.5 7.2 8.3  Hemoglobin 12.0 - 15.0 g/dL 12.5 11.2(L) 10.6(L)  Hematocrit 36.0 - 46.0 % 40.8 37.3 32.8(L)  Platelets 150 - 400 K/uL 409(H) 468(H) 517.0(H)   BMP Latest Ref Rng & Units 03/13/2021 02/16/2020 08/25/2019  Glucose 70 - 99 mg/dL 105(H) 103(H) 106(H)  BUN 6 - 20 mg/dL 15 17 20   Creatinine 0.44 - 1.00 mg/dL 0.54 0.64 0.64  Sodium 135 - 145 mmol/L 138 138 139  Potassium 3.5 - 5.1 mmol/L 4.1 3.5 4.1  Chloride 98 - 111 mmol/L 106 103 102  CO2 22 - 32 mmol/L 25 25 25   Calcium 8.9 - 10.3 mg/dL 9.0 9.4 9.3    She was not able to participate in a full physical therapy due to late arrival to the floor.  Patient will stay in the hospital to get better pain control and progress to a level of independent weightbearing and stair climbing.

## 2021-03-19 NOTE — Progress Notes (Addendum)
Out of bed to chair without difficulty.

## 2021-03-19 NOTE — Progress Notes (Signed)
Physical Therapy Treatment Patient Details Name: Rachel [name] MRN: [id number] DOB: 02-16-1961 Today's Date: 03/19/2021  LEFT KNEE ROM:  0 - 110 degrees AMBULATION DISTANCE:  100 feet using RW with Supervision/Modified Independent   History of Present Illness Rachel [name] is a 60 y/o female, s/p Left TKA on 03/18/21, with the diagnosis of left knee osteoarthritis.    PT Comments    Patient demonstrates increased endurance/distance for gait training with fair/good return for left heel to toe stepping without loss of balance, fair/good return for going up/down steps using 1 side rail with both hands with step to pattern without loss of balance and acknowledged understanding for proper step sequence and position of helper.  Patient tolerated sitting up in chair after transferring to/from commode in bathroom.  Patient will benefit from continued physical therapy in hospital and recommended venue below to increase strength, balance, endurance for safe ADLs and gait.    Recommendations for follow up therapy are one component of a multi-disciplinary discharge planning process, led by the attending physician.  Recommendations may be updated based on patient status, additional functional criteria and insurance authorization.  Follow Up Recommendations  Home health PT;Supervision for mobility/OOB;Supervision - Intermittent     Equipment Recommendations  Rolling walker with 5" wheels;3in1 (PT)    Recommendations for Other Services       Precautions / Restrictions Precautions Precautions: Fall Restrictions Weight Bearing Restrictions: Yes LLE Weight Bearing: Weight bearing as tolerated     Mobility  Bed Mobility Overal bed mobility: Needs Assistance       Supine to sit: Modified independent (Device/Increase time);Supervision     General bed mobility comments: slightly labored movement    Transfers Overall transfer level: Needs assistance Equipment used: Rolling walker  (2 wheeled) Transfers: Sit to/from [facility] Sit to Stand: Modified independent (Device/Increase time);Supervision Stand pivot transfers: Modified independent (Device/Increase time);Supervision       General transfer comment: demonsrates good return for transferring to/from commode in bathroom and to chair at bedside  Ambulation/Gait Ambulation/Gait assistance: Modified independent (Device/Increase time);Supervision Gait Distance (Feet): 100 Feet Assistive device: Rolling walker (2 wheeled) Gait Pattern/deviations: Decreased step length - left;Decreased stance time - left;Decreased stride length;Antalgic Gait velocity: decreased   General Gait Details: demonstrates increased endurance/distance for ambulation with fair/good return for left heel to toe stepping without loss of balance with slow slightly labored cadence   Stairs Stairs: Yes Stairs assistance: Min guard Stair Management: One rail Left;Step to pattern Number of Stairs: 4 General stair comments: Patient demonstrates good return for going up/down 4 steps using both hands on 1 siderail without loss of balance with understanding acknowledged for proper step sequence and position of helper   Wheelchair Mobility    Modified Rankin (Stroke Patients Only)       Balance Overall balance assessment: Needs assistance Sitting-balance support: Feet supported;No upper extremity supported Sitting balance-Leahy Scale: Good Sitting balance - Comments: seated at EOB   Standing balance support: During functional activity;Bilateral upper extremity supported Standing balance-Leahy Scale: Fair Standing balance comment: using RW                            Cognition Arousal/Alertness: Awake/alert Behavior During Therapy: WFL for tasks assessed/performed Overall Cognitive Status: Within Functional Limits for tasks assessed  Exercises Total Joint  Exercises Ankle Circles/Pumps: Supine;10 reps;Left;Strengthening;AROM Quad Sets: Supine;10 reps;Left;Strengthening;AROM Short Arc Quad: Supine;10 reps;Left;Strengthening;AROM Heel Slides: Supine;10 reps;Left;Strengthening;AROM    General Comments        Pertinent Vitals/Pain Pain Assessment: 0-10 Pain Score: 7  Pain Location: left knee Pain Descriptors / Indicators: Sore Pain Intervention(s): Limited activity within patient's tolerance;Monitored during session;Repositioned    Home Living                      Prior Function            PT Goals (current goals can now be found in the care plan section) Acute Rehab PT Goals Patient Stated Goal: return home with family to assist PT Goal Formulation: With patient Time For Goal Achievement: 03/21/21 Potential to Achieve Goals: Good Progress towards PT goals: Progressing toward goals    Frequency    BID      PT Plan Current plan remains appropriate    Co-evaluation              AM-PAC PT "6 Clicks" Mobility   Outcome Measure  Help needed turning from your back to your side while in a flat bed without using bedrails?: None Help needed moving from lying on your back to sitting on the side of a flat bed without using bedrails?: None Help needed moving to and from a bed to a chair (including a wheelchair)?: None Help needed standing up from a chair using your arms (e.g., wheelchair or bedside chair)?: None Help needed to walk in hospital room?: A Little Help needed climbing 3-5 steps with a railing? : A Little 6 Click Score: 22    End of Session   Activity Tolerance: Patient tolerated treatment well;Patient limited by fatigue;Patient limited by pain Patient left: in chair;with call bell/phone within reach Nurse Communication: Mobility status PT Visit Diagnosis: Unsteadiness on feet (R26.81);Other abnormalities of gait and mobility (R26.89);Muscle weakness (generalized) (M62.81)     Time: 6720-9470 PT  Time Calculation (min) (ACUTE ONLY): 25 min  Charges:  $Gait Training: 8-22 mins $Therapeutic Exercise: 8-22 mins                     3:11 PM, 03/19/21 Lonell Grandchild, MPT Physical Therapist with [facility] [phone] office [phone] mobile phone

## 2021-03-19 NOTE — Evaluation (Addendum)
Physical Therapy Evaluation Patient Details Name: Rachel [name] MRN: [id number] DOB: [birth year] Today's Date: 03/19/2021  LEFT KNEE ROM:  3 - 110 degrees AMBULATION DISTANCE:  45 feet using RW with Supervision   History of Present Illness  Rachel [name] is a 60 y/o female, s/p Left TKA on 03/18/21, with the diagnosis of left knee osteoarthritis.  Clinical Impression  Patient demonstrates good return for moving LLE during supine to sitting, able to achieve 110 degrees left knee flexion self stretching while seated at bedside, slow labored cadence with fair return for left heel to toe stepping during gait training and limited mostly due to c/o fatigue and increasing left knee pain.  Patient tolerated sitting up in chair with LLE dangling after therapy - nurse aware.  Patient will benefit from continued physical therapy in hospital and recommended venue below to increase strength, balance, endurance for safe ADLs and gait.        Recommendations for follow up therapy are one component of a multi-disciplinary discharge planning process, led by the attending physician.  Recommendations may be updated based on patient status, additional functional criteria and insurance authorization.  Follow Up Recommendations Home health PT;Supervision for mobility/OOB;Supervision - Intermittent    Equipment Recommendations  Rolling walker with 5" wheels;3in1 (PT)    Recommendations for Other Services       Precautions / Restrictions Precautions Precautions: Fall Restrictions Weight Bearing Restrictions: Yes LLE Weight Bearing: Weight bearing as tolerated      Mobility  Bed Mobility Overal bed mobility: Needs Assistance Bed Mobility: Supine to Sit     Supine to sit: Modified independent (Device/Increase time);Supervision     General bed mobility comments: increased time, slightly labored movement with good return for moving LLE    Transfers Overall transfer level: Needs  assistance Equipment used: Rolling walker (2 wheeled) Transfers: Sit to/from [facility] Sit to Stand: Supervision Stand pivot transfers: Supervision       General transfer comment: slow labored movement  Ambulation/Gait Ambulation/Gait assistance: Supervision Gait Distance (Feet): 45 Feet Assistive device: Rolling walker (2 wheeled) Gait Pattern/deviations: Decreased step length - left;Decreased stance time - left;Decreased stride length;Antalgic Gait velocity: decreased   General Gait Details: slow labored cadence with fair return for left heel to stepping, no loss of balance, limited mostly due to c/o fatigue and increasing left knee pain  Stairs            Wheelchair Mobility    Modified Rankin (Stroke Patients Only)       Balance Overall balance assessment: Needs assistance Sitting-balance support: Feet supported;No upper extremity supported Sitting balance-Leahy Scale: Good Sitting balance - Comments: seated at EOB   Standing balance support: During functional activity;Bilateral upper extremity supported Standing balance-Leahy Scale: Fair Standing balance comment: using RW                             Pertinent Vitals/Pain Pain Assessment: 0-10 Pain Score: 6  Pain Location: left knee Pain Descriptors / Indicators: Sore;Grimacing Pain Intervention(s): Limited activity within patient's tolerance;Monitored during session;Repositioned;Patient requesting pain meds-RN notified    Home Living Family/patient expects to be discharged to:: Private residence Living Arrangements: Children Available Help at Discharge: Family;Available PRN/intermittently Type of Home: Mobile home Home Access: Stairs to enter Entrance Stairs-Rails: Left Entrance Stairs-Number of Steps: 4 in front porch without siderails, 4 steps in back with left siderail Home Layout: One level Home Equipment: None  Prior Function Level of Independence: Independent          Comments: Hydrographic surveyor, drives, 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     Hand Dominance   Dominant Hand: Right    Extremity/Trunk Assessment   Upper Extremity Assessment Upper Extremity Assessment: Overall WFL for tasks assessed    Lower Extremity Assessment Lower Extremity Assessment: Generalized weakness;LLE deficits/detail LLE Deficits / Details: grossly -4/5 LLE: Unable to fully assess due to pain LLE Sensation: WNL LLE Coordination: WNL    Cervical / Trunk Assessment Cervical / Trunk Assessment: Normal  Communication   Communication: No difficulties  Cognition Arousal/Alertness: Awake/alert Behavior During Therapy: WFL for tasks assessed/performed Overall Cognitive Status: Within Functional Limits for tasks assessed                                        General Comments      Exercises Total Joint Exercises Ankle Circles/Pumps: Supine;10 reps;Left;Strengthening;AROM Quad Sets: Supine;10 reps;Left;Strengthening;AROM Short Arc Quad: Supine;10 reps;Left;Strengthening;AROM Heel Slides: Supine;10 reps;Left;Strengthening;AROM Goniometric ROM: Left knee: 3 - 110 degrees   Assessment/Plan    PT Assessment Patient needs continued PT services  PT Problem List Decreased strength;Decreased range of motion;Decreased activity tolerance;Decreased balance;Decreased mobility       PT Treatment Interventions DME instruction;Stair training;Therapeutic activities;Gait training;Functional mobility training;Therapeutic exercise;Balance training;Patient/family education    PT Goals (Current goals can be found in the Care Plan section)  Acute Rehab PT Goals Patient Stated Goal: return home with family to assist PT Goal Formulation: With patient Time For Goal Achievement: 03/21/21 Potential to Achieve Goals: Good    Frequency BID   Barriers to discharge        Co-evaluation               AM-PAC PT "6 Clicks" Mobility   Outcome Measure Help needed turning from your back to your side while in a flat bed without using bedrails?: None Help needed moving from lying on your back to sitting on the side of a flat bed without using bedrails?: None Help needed moving to and from a bed to a chair (including a wheelchair)?: A Little Help needed standing up from a chair using your arms (e.g., wheelchair or bedside chair)?: A Little Help needed to walk in hospital room?: A Little Help needed climbing 3-5 steps with a railing? : A Little 6 Click Score: 20    End of Session   Activity Tolerance: Patient tolerated treatment well;Patient limited by fatigue;Patient limited by pain Patient left: in chair;with call bell/phone within reach Nurse Communication: Mobility status PT Visit Diagnosis: Unsteadiness on feet (R26.81);Other abnormalities of gait and mobility (R26.89);Muscle weakness (generalized) (M62.81)    Time: 7017-7939 PT Time Calculation (min) (ACUTE ONLY): 25 min   Charges:   PT Evaluation $PT Eval Moderate Complexity: 1 Mod PT Treatments $Therapeutic Activity: 23-37 mins        10:00 AM, 03/19/21 Lonell Grandchild, MPT Physical Therapist with [facility] [phone] office [phone] mobile phone

## 2021-03-20 ENCOUNTER — Telehealth: Payer: Self-pay | Admitting: Orthopedic Surgery

## 2021-03-20 MED ORDER — GABAPENTIN 100 MG PO CAPS: 100.0000 mg | ORAL_CAPSULE | Freq: Three times a day (TID) | ORAL | 1 refills | Status: DC

## 2021-03-20 MED ORDER — IBUPROFEN 800 MG PO TABS: 800.0000 mg | ORAL_TABLET | Freq: Four times a day (QID) | ORAL | 0 refills | Status: DC | PRN

## 2021-03-20 MED ORDER — OXYCODONE HCL 5 MG PO TABS
5.0000 mg | ORAL_TABLET | ORAL | Status: DC
  Administered 2021-03-20 (×3): 5 mg via ORAL
  Filled 2021-03-20 (×3): qty 1

## 2021-03-20 MED ORDER — OXYCODONE HCL 5 MG PO TABS
10.0000 mg | ORAL_TABLET | ORAL | Status: DC | PRN
  Administered 2021-03-20: 10 mg via ORAL
  Filled 2021-03-20: qty 2

## 2021-03-20 MED ORDER — METHOCARBAMOL 500 MG PO TABS: 500.0000 mg | ORAL_TABLET | Freq: Four times a day (QID) | ORAL | 2 refills | Status: DC | PRN

## 2021-03-20 MED ORDER — BISACODYL 5 MG PO TBEC
5.0000 mg | DELAYED_RELEASE_TABLET | Freq: Every day | ORAL | 0 refills | Status: DC | PRN
Start: 2021-03-20 — End: 2021-05-26

## 2021-03-20 MED ORDER — IBUPROFEN 800 MG PO TABS: 800.0000 mg | ORAL_TABLET | Freq: Four times a day (QID) | ORAL | Status: DC | PRN

## 2021-03-20 MED ORDER — GABAPENTIN 100 MG PO CAPS
100.0000 mg | ORAL_CAPSULE | Freq: Three times a day (TID) | ORAL | Status: DC
  Filled 2021-03-20: qty 1

## 2021-03-20 MED ORDER — OXYCODONE-ACETAMINOPHEN 10-325 MG PO TABS: 1.0000 | ORAL_TABLET | ORAL | 0 refills | Status: AC | PRN

## 2021-03-20 MED ORDER — GABAPENTIN 100 MG PO CAPS
100.0000 mg | ORAL_CAPSULE | Freq: Three times a day (TID) | ORAL | Status: DC
  Administered 2021-03-20 (×2): 100 mg via ORAL
  Filled 2021-03-20: qty 1

## 2021-03-20 MED ORDER — ASPIRIN 325 MG PO TBEC: 325.0000 mg | DELAYED_RELEASE_TABLET | Freq: Every day | ORAL | 0 refills | Status: DC

## 2021-03-20 MED ORDER — PROMETHAZINE HCL 12.5 MG PO TABS: 12.5000 mg | ORAL_TABLET | ORAL | 0 refills | Status: DC | PRN

## 2021-03-20 NOTE — TOC Transition Note (Signed)
Transition of Care [facility]) - CM/SW Discharge Note   Patient Details  Name: Rachel [name] MRN: [id number] Date of Birth: [birth year]  [facility] of [facility]) CM/SW Contact:  Iona Beard, [place] Phone Number: 03/20/2021, 9:39 AM   Clinical Narrative:    Pt is recommended for [facility] services. Pt has been prearranged for services with [facility]. CSW confirmed with rep Marjory Lies that he has the orders and services will start once pt discharges from the hospital. Pts equipment has been ordered by Dr. Ruthe Mannan office. TOC signing off.    Final next level of care: [place] Barriers to Discharge: No Barriers Identified   Patient Goals and CMS Choice Patient states their goals for this hospitalization and ongoing recovery are:: Home with [facility] CMS Medicare.gov Compare Post Acute Care list provided to:: Patient Choice offered to / list presented to : Patient  Discharge Placement                       Discharge Plan and Services                          HH Arranged: PT [facility] Agency: [place] Determinants of Health (SDOH) Interventions     Readmission Risk Interventions No flowsheet data found.

## 2021-03-20 NOTE — Progress Notes (Signed)
Nsg Discharge Note  Admit Date:  03/18/2021 Discharge date: 03/20/2021   Rachel [name] to be D/C'd Home per MD order.  AVS completed.  Copy for chart, and copy for patient signed, and dated. Patient/caregiver able to verbalize understanding.  Discharge Medication: Allergies as of 03/20/2021       Reactions   Hydrocodone Nausea And Vomiting   Oxycodone Nausea And Vomiting   Codeine, hydrocodone, most pain pills.   Penicillins Itching, Nausea And Vomiting, Swelling   Has patient had a PCN reaction causing immediate rash, facial/tongue/throat swelling, SOB or lightheadedness with hypotension: Yes, around the eyes Has patient had a PCN reaction causing severe rash involving mucus membranes or skin necrosis: No Has patient had a PCN reaction that required hospitalization: No Has patient had a PCN reaction occurring within the last 10 years: No  If all of the above answers are "NO", then may proceed with Cephalosporin use.   Precedex [dexmedetomidine Hcl In Nacl]    Increased agitation/confusion in ICU [April 2018]   Seroquel [quetiapine]    More agitated/confused per daughter [name] when given in the past.   Shellfish Allergy Itching        Medication List     STOP taking these medications    meloxicam 7.5 MG tablet Commonly known as: Mobic   phentermine 37.5 MG capsule       TAKE these medications    3-in-1 Bedside Toilet Misc Bedside commode M17.12 osteoarthritis left knee will have total knee replacement 03/18/21 length of need 99 months   ALLERGY EYE DROPS OP Place 1 drop into both eyes daily as needed (allergies).   aspirin 325 MG EC tablet Take 1 tablet (325 mg total) by mouth daily with breakfast. Start taking on: March 21, 2021   bisacodyl 5 MG EC tablet Commonly known as: DULCOLAX Take 1 tablet (5 mg total) by mouth daily as needed for moderate constipation.   cetirizine 10 MG tablet Commonly known as: ZYRTEC Take 10 mg by mouth daily as needed for  allergies.   clonazePAM 1 MG tablet Commonly known as: KLONOPIN Take 1 mg by mouth 3 (three) times daily as needed for anxiety.   diclofenac Sodium 1 % Gel Commonly known as: VOLTAREN Apply 2 g topically 3 (three) times daily as needed (pain).   gabapentin 100 MG capsule Commonly known as: NEURONTIN Take 1 capsule (100 mg total) by mouth 3 (three) times daily.   ibuprofen 800 MG tablet Commonly known as: ADVIL Take 1 tablet (800 mg total) by mouth every 6 (six) hours as needed for mild pain or moderate pain. What changed:  medication strength how much to take reasons to take this   Melatonin 10 MG Tabs Take 20 mg by mouth at bedtime as needed (sleep).   methocarbamol 500 MG tablet Commonly known as: ROBAXIN Take 1 tablet (500 mg total) by mouth every 6 (six) hours as needed for muscle spasms.   oxyCODONE-acetaminophen 10-325 MG tablet Commonly known as: Percocet Take 1 tablet by mouth every 4 (four) hours as needed for up to 7 days for pain.   pantoprazole 40 MG tablet Commonly known as: PROTONIX Take 40 mg by mouth daily.   promethazine 12.5 MG tablet Commonly known as: PHENERGAN Take 1 tablet (12.5 mg total) by mouth every 4 (four) hours as needed for nausea or vomiting. Take before percocet   rosuvastatin 5 MG tablet Commonly known as: CRESTOR Take 5 mg by mouth daily.   telmisartan-hydrochlorothiazide 80-25 MG tablet  Commonly known as: MICARDIS HCT Take 1 tablet by mouth daily.               Durable Medical Equipment  (From admission, onward)           Start     Ordered   03/20/21 1720  For home use only DME Walker rolling  Once       Question Answer Comment  Walker: With 5 Inch Wheels   Patient needs a walker to treat with the following condition Arthritis of knee      03/20/21 1719            Discharge Assessment: Vitals:   03/20/21 0351 03/20/21 1256  BP: (!) 159/76 121/71  Pulse: (!) 110 (!) 110  Resp: 18 18  Temp: 99.8 F  (37.7 C) 98.2 F (36.8 C)  SpO2: 95% 97%   Skin clean, dry and intact without evidence of skin break down, no evidence of skin tears noted. IV catheter discontinued intact. Site without signs and symptoms of complications - no redness or edema noted at insertion site, patient denies c/o pain - only slight tenderness at site.  Dressing with slight pressure applied.  D/c Instructions-Education: Discharge instructions given to patient/family with verbalized understanding. D/c education completed with patient/family including follow up instructions, medication list, d/c activities limitations if indicated, with other d/c instructions as indicated by MD - patient able to verbalize understanding, all questions fully answered. Patient instructed to return to ED, call 911, or call MD for any changes in condition.  Patient escorted via [place], and D/C home via private auto.  [name], RN 03/20/2021 5:48 PM Nsg Discharge Note  Admit Date:  03/18/2021 Discharge date: 03/20/2021   Rachel [name] to be D/C'd Home per MD order.  AVS completed.  Copy for chart, and copy for patient signed, and dated. Patient/caregiver able to verbalize understanding.  Discharge Medication: Allergies as of 03/20/2021       Reactions   Hydrocodone Nausea And Vomiting   Oxycodone Nausea And Vomiting   Codeine, hydrocodone, most pain pills.   Penicillins Itching, Nausea And Vomiting, Swelling   Has patient had a PCN reaction causing immediate rash, facial/tongue/throat swelling, SOB or lightheadedness with hypotension: Yes, around the eyes Has patient had a PCN reaction causing severe rash involving mucus membranes or skin necrosis: No Has patient had a PCN reaction that required hospitalization: No Has patient had a PCN reaction occurring within the last 10 years: No  If all of the above answers are "NO", then may proceed with Cephalosporin use.   Precedex [dexmedetomidine Hcl In Nacl]    Increased  agitation/confusion in ICU [April 2018]   Seroquel [quetiapine]    More agitated/confused per daughter [name] when given in the past.   Shellfish Allergy Itching        Medication List     STOP taking these medications    meloxicam 7.5 MG tablet Commonly known as: Mobic   phentermine 37.5 MG capsule       TAKE these medications    3-in-1 Bedside Toilet Misc Bedside commode M17.12 osteoarthritis left knee will have total knee replacement 03/18/21 length of need 99 months   ALLERGY EYE DROPS OP Place 1 drop into both eyes daily as needed (allergies).   aspirin 325 MG EC tablet Take 1 tablet (325 mg total) by mouth daily with breakfast. Start taking on: March 21, 2021   bisacodyl 5 MG EC tablet Commonly known as: DULCOLAX  Take 1 tablet (5 mg total) by mouth daily as needed for moderate constipation.   cetirizine 10 MG tablet Commonly known as: ZYRTEC Take 10 mg by mouth daily as needed for allergies.   clonazePAM 1 MG tablet Commonly known as: KLONOPIN Take 1 mg by mouth 3 (three) times daily as needed for anxiety.   diclofenac Sodium 1 % Gel Commonly known as: VOLTAREN Apply 2 g topically 3 (three) times daily as needed (pain).   gabapentin 100 MG capsule Commonly known as: NEURONTIN Take 1 capsule (100 mg total) by mouth 3 (three) times daily.   ibuprofen 800 MG tablet Commonly known as: ADVIL Take 1 tablet (800 mg total) by mouth every 6 (six) hours as needed for mild pain or moderate pain. What changed:  medication strength how much to take reasons to take this   Melatonin 10 MG Tabs Take 20 mg by mouth at bedtime as needed (sleep).   methocarbamol 500 MG tablet Commonly known as: ROBAXIN Take 1 tablet (500 mg total) by mouth every 6 (six) hours as needed for muscle spasms.   oxyCODONE-acetaminophen 10-325 MG tablet Commonly known as: Percocet Take 1 tablet by mouth every 4 (four) hours as needed for up to 7 days for pain.   pantoprazole 40  MG tablet Commonly known as: PROTONIX Take 40 mg by mouth daily.   promethazine 12.5 MG tablet Commonly known as: PHENERGAN Take 1 tablet (12.5 mg total) by mouth every 4 (four) hours as needed for nausea or vomiting. Take before percocet   rosuvastatin 5 MG tablet Commonly known as: CRESTOR Take 5 mg by mouth daily.   telmisartan-hydrochlorothiazide 80-25 MG tablet Commonly known as: MICARDIS HCT Take 1 tablet by mouth daily.               Durable Medical Equipment  (From admission, onward)           Start     Ordered   03/20/21 1720  For home use only DME Walker rolling  Once       Question Answer Comment  Walker: With 5 Inch Wheels   Patient needs a walker to treat with the following condition Arthritis of knee      03/20/21 1719            Discharge Assessment: Vitals:   03/20/21 0351 03/20/21 1256  BP: (!) 159/76 121/71  Pulse: (!) 110 (!) 110  Resp: 18 18  Temp: 99.8 F (37.7 C) 98.2 F (36.8 C)  SpO2: 95% 97%   Skin clean, dry and intact without evidence of skin break down, no evidence of skin tears noted. IV catheter discontinued intact. Site without signs and symptoms of complications - no redness or edema noted at insertion site, patient denies c/o pain - only slight tenderness at site.  Dressing with slight pressure applied.  D/c Instructions-Education: Discharge instructions given to patient/family with verbalized understanding. D/c education completed with patient/family including follow up instructions, medication list, d/c activities limitations if indicated, with other d/c instructions as indicated by MD - patient able to verbalize understanding, all questions fully answered. Patient instructed to return to ED, call 911, or call MD for any changes in condition.  Patient escorted via [place], and D/C home via private auto.  [name], LPN 03/02/91 3:30 PM

## 2021-03-20 NOTE — Discharge Summary (Signed)
Physician Discharge Summary  Patient ID: Rachel [name] MRN: [id number] DOB/AGE: January 12, 1961 60 y.o.  Admit date: 03/18/2021 Discharge date: 03/20/2021  Admission Diagnoses: Arthritis left knee  Discharge Diagnoses: Same  Discharged Condition: Stable  Procedure: Left total knee  Attune fixed-bearing posterior stabilized  Hospital Course:  Standard left total knee spinal anesthetic saphenous nerve block postop, Intra-Op Zynrelef  Pain hard to control secondary to multiple allergies  PT note LEFT KNEE ROM:  0 - 110 degrees AMBULATION DISTANCE:  100 feet using RW with Supervision/Modified Independent  The patient stayed in the hospital extra day secondary to pain control issues.    Discharge Exam: BP 121/71 (BP Location: Left Arm)   Pulse (!) 110   Temp 98.2 F (36.8 C) (Oral)   Resp 18   SpO2 97%  Physical Exam  Neurovascular intact awake alert and oriented x3 no signs of DVT  Disposition: Discharge disposition: 01-Home or Self Care       Discharge Instructions     Call MD / Call 911   Complete by: As directed    If you experience chest pain or shortness of breath, CALL 911 and be transported to the hospital emergency room.  If you develope a fever above 101 F, pus (white drainage) or increased drainage or redness at the wound, or calf pain, call your surgeon's office.   Constipation Prevention   Complete by: As directed    Drink plenty of fluids.  Prune juice may be helpful.  You may use a stool softener, such as Colace (over the counter) 100 mg twice a day.  Use MiraLax (over the counter) for constipation as needed.   Diet - low sodium heart healthy   Complete by: As directed    Discharge instructions   Complete by: As directed    CPM machine 2 hours 3 times a day for 3 weeks  TED hose x4 weeks can remove at night  Bone foam 30 minutes 3 times a day  Do not change dressing   Increase activity slowly as tolerated   Complete by: As directed     Post-operative opioid taper instructions:   Complete by: As directed    POST-OPERATIVE OPIOID TAPER INSTRUCTIONS: It is important to wean off of your opioid medication as soon as possible. If you do not need pain medication after your surgery it is ok to stop day one. Opioids include: Codeine, Hydrocodone(Norco, Vicodin), Oxycodone(Percocet, oxycontin) and hydromorphone amongst others.  Long term and even short term use of opiods can cause: Increased pain response Dependence Constipation Depression Respiratory depression And more.  Withdrawal symptoms can include Flu like symptoms Nausea, vomiting And more Techniques to manage these symptoms Hydrate well Eat regular healthy meals Stay active Use relaxation techniques(deep breathing, meditating, yoga) Do Not substitute Alcohol to help with tapering If you have been on opioids for less than two weeks and do not have pain than it is ok to stop all together.  Plan to wean off of opioids This plan should start within one week post op of your joint replacement. Maintain the same interval or time between taking each dose and first decrease the dose.  Cut the total daily intake of opioids by one tablet each day Next start to increase the time between doses. The last dose that should be eliminated is the evening dose.         Allergies as of 03/20/2021       Reactions   Hydrocodone Nausea And Vomiting  Oxycodone Nausea And Vomiting   Codeine, hydrocodone, most pain pills.   Penicillins Itching, Nausea And Vomiting, Swelling   Has patient had a PCN reaction causing immediate rash, facial/tongue/throat swelling, SOB or lightheadedness with hypotension: Yes, around the eyes Has patient had a PCN reaction causing severe rash involving mucus membranes or skin necrosis: No Has patient had a PCN reaction that required hospitalization: No Has patient had a PCN reaction occurring within the last 10 years: No  If all of the above answers  are "NO", then may proceed with Cephalosporin use.   Precedex [dexmedetomidine Hcl In Nacl]    Increased agitation/confusion in ICU [April 2018]   Seroquel [quetiapine]    More agitated/confused per daughter [name] when given in the past.   Shellfish Allergy Itching        Medication List     STOP taking these medications    meloxicam 7.5 MG tablet Commonly known as: Mobic   phentermine 37.5 MG capsule       TAKE these medications    3-in-1 Bedside Toilet Misc Bedside commode M17.12 osteoarthritis left knee will have total knee replacement 03/18/21 length of need 99 months   ALLERGY EYE DROPS OP Place 1 drop into both eyes daily as needed (allergies).   aspirin 325 MG EC tablet Take 1 tablet (325 mg total) by mouth daily with breakfast. Start taking on: March 21, 2021   bisacodyl 5 MG EC tablet Commonly known as: DULCOLAX Take 1 tablet (5 mg total) by mouth daily as needed for moderate constipation.   cetirizine 10 MG tablet Commonly known as: ZYRTEC Take 10 mg by mouth daily as needed for allergies.   clonazePAM 1 MG tablet Commonly known as: KLONOPIN Take 1 mg by mouth 3 (three) times daily as needed for anxiety.   diclofenac Sodium 1 % Gel Commonly known as: VOLTAREN Apply 2 g topically 3 (three) times daily as needed (pain).   gabapentin 100 MG capsule Commonly known as: NEURONTIN Take 1 capsule (100 mg total) by mouth 3 (three) times daily.   ibuprofen 800 MG tablet Commonly known as: ADVIL Take 1 tablet (800 mg total) by mouth every 6 (six) hours as needed for mild pain or moderate pain. What changed:  medication strength how much to take reasons to take this   Melatonin 10 MG Tabs Take 20 mg by mouth at bedtime as needed (sleep).   methocarbamol 500 MG tablet Commonly known as: ROBAXIN Take 1 tablet (500 mg total) by mouth every 6 (six) hours as needed for muscle spasms.   oxyCODONE-acetaminophen 10-325 MG tablet Commonly known as:  Percocet Take 1 tablet by mouth every 4 (four) hours as needed for up to 7 days for pain.   pantoprazole 40 MG tablet Commonly known as: PROTONIX Take 40 mg by mouth daily.   promethazine 12.5 MG tablet Commonly known as: PHENERGAN Take 1 tablet (12.5 mg total) by mouth every 4 (four) hours as needed for nausea or vomiting. Take before percocet   rosuvastatin 5 MG tablet Commonly known as: CRESTOR Take 5 mg by mouth daily.   telmisartan-hydrochlorothiazide 80-25 MG tablet Commonly known as: MICARDIS HCT Take 1 tablet by mouth daily.         Signed: Arther Abbott 03/20/2021, 4:58 PM

## 2021-03-20 NOTE — Progress Notes (Signed)
Patient ID: Rachel [name], female   DOB: 12-May-1961, 60 y.o.   MRN: [id number] POD 2 LEFT TKA   STILL HAVING PAIN CONTROL ISSUES   N/V TO ALL PAIN MEDS  MORPHINE NOT CONTROLLING PAIN   WE DISCUSSED POSSIBLE SOLUTIONS  -WILL TRY OXYCODONE AND PHENERGAN AT HOME   PLAN D/C TODAY

## 2021-03-20 NOTE — Progress Notes (Signed)
Physical Therapy Treatment Patient Details Name: Rachel [name] MRN: [id number] DOB: 14-Feb-1961 Today's Date: 03/20/2021  LEFT KNEE ROM:  0 - 110 degrees AMBULATION DISTANCE:  150 feet using RW with Modified Independent  History of Present Illness Rachel [name] is a 60 y/o female, s/p Left TKA on 03/18/21, with the diagnosis of left knee osteoarthritis.    PT Comments    Patient demonstrates good return for transferring to commode in bathroom and donning cloths, pants without assistance, increased endurance/distance for ambulation in hallway with fair/good return for left heel to toe stepping without loss of balance, limited mostly due to fatigue and tolerated sitting up in chair with LLE dangling after therapy.  Patient will benefit from continued physical therapy in hospital and recommended venue below to increase strength, balance, endurance for safe ADLs and gait.    Recommendations for follow up therapy are one component of a multi-disciplinary discharge planning process, led by the attending physician.  Recommendations may be updated based on patient status, additional functional criteria and insurance authorization.  Follow Up Recommendations  Home health PT;Supervision for mobility/OOB;Supervision - Intermittent     Equipment Recommendations  Rolling walker with 5" wheels;3in1 (PT)    Recommendations for Other Services       Precautions / Restrictions Precautions Precautions: Fall Restrictions Weight Bearing Restrictions: Yes LLE Weight Bearing: Weight bearing as tolerated     Mobility  Bed Mobility Overal bed mobility: Modified Independent;Independent                  Transfers Overall transfer level: Modified independent               General transfer comment: good return for transferring to/from commode and chair without loss of balance  Ambulation/Gait Ambulation/Gait assistance: Modified independent (Device/Increase time) Gait Distance  (Feet): 150 Feet Assistive device: Rolling walker (2 wheeled) Gait Pattern/deviations: Decreased step length - left;Decreased stance time - left;Decreased stride length;Antalgic Gait velocity: decreased   General Gait Details: slow slightly labored cadence with fair/good return for left heel to toe stepping without loss of balance and left knee pain at baseline   Stairs             Wheelchair Mobility    Modified Rankin (Stroke Patients Only)       Balance Overall balance assessment: Needs assistance Sitting-balance support: Feet supported;No upper extremity supported Sitting balance-Leahy Scale: Good Sitting balance - Comments: seated at EOB   Standing balance support: During functional activity;Bilateral upper extremity supported Standing balance-Leahy Scale: Fair Standing balance comment: fair/good using RW                            Cognition Arousal/Alertness: Awake/alert Behavior During Therapy: WFL for tasks assessed/performed Overall Cognitive Status: Within Functional Limits for tasks assessed                                        Exercises Other Exercises Other Exercises: self stretching left knee using RLE for 30 second holds x 4 reps    General Comments        Pertinent Vitals/Pain Pain Assessment: 0-10 Pain Score: 2  Pain Location: left knee Pain Descriptors / Indicators: Sore Pain Intervention(s): Limited activity within patient's tolerance;Monitored during session;Premedicated before session    Home Living  Prior Function            PT Goals (current goals can now be found in the care plan section) Acute Rehab PT Goals Patient Stated Goal: return home with family to assist PT Goal Formulation: With patient Time For Goal Achievement: 03/21/21 Potential to Achieve Goals: Good Progress towards PT goals: Progressing toward goals    Frequency    BID      PT Plan Current  plan remains appropriate    Co-evaluation              AM-PAC PT "6 Clicks" Mobility   Outcome Measure  Help needed turning from your back to your side while in a flat bed without using bedrails?: None Help needed moving from lying on your back to sitting on the side of a flat bed without using bedrails?: None Help needed moving to and from a bed to a chair (including a wheelchair)?: None Help needed standing up from a chair using your arms (e.g., wheelchair or bedside chair)?: None Help needed to walk in hospital room?: A Little Help needed climbing 3-5 steps with a railing? : A Little 6 Click Score: 22    End of Session   Activity Tolerance: Patient tolerated treatment well;Patient limited by fatigue Patient left: in chair;with call bell/phone within reach Nurse Communication: Mobility status PT Visit Diagnosis: Unsteadiness on feet (R26.81);Other abnormalities of gait and mobility (R26.89);Muscle weakness (generalized) (M62.81)     Time: 2111-7356 PT Time Calculation (min) (ACUTE ONLY): 34 min  Charges:  $Gait Training: 8-22 mins $Therapeutic Activity: 8-22 mins                     10:23 AM, 03/20/21 Lonell Grandchild, MPT Physical Therapist with [facility] [phone] office [phone] mobile phone

## 2021-03-20 NOTE — Telephone Encounter (Signed)
Just FYI   Patient called and asked if Dr. Aline Brochure had forgot about her.  She is waiting to be discharged.   She said she was told after lunch she would be discharged, and she has already called a ride to come and get her.   I advised her Dr. Aline Brochure is seeing patients at the office and it will be after he sees patients in clinic.    She does not want Dr. Dutch Quint to forget about her and not come over and discharge her.

## 2021-03-21 ENCOUNTER — Encounter (HOSPITAL_COMMUNITY): Payer: Self-pay | Admitting: Orthopedic Surgery

## 2021-03-24 NOTE — H&P (Signed)
Chief Complaint  Patient presents with   Knee Pain      Left/ has questions about knee replacement surgery insurance company needs documentation she is a non smoker and she has tried to lose weight       \60 year old female presents for preop left total knee  Patient has lost appropriate amount of weight BMI is now under 40 she stopped smoking several years ago  Unfortunately insurance is still declining her approval we will call them to see why   BP (!) 152/97   Pulse (!) 110   Ht 5' (1.524 m)   Wt 200 lb (90.7 kg)   BMI 39.06 kg/m   The left knee is still painful tender range of motion is painful throughout the knee is otherwise stable good muscle tone skin is intact distal neurovascular function is normal patient ambulates with a limp  The patient indicates her activities of daily living have become more difficult her lifestyle is difficult as well she would really like to proceed with operative treatment with a left total knee  We went over the model today showing the implant as well as the x-rays  X-ray shows osteoarthritis primarily medial compartment but also involving patellofemoral joint           Past Medical History:  Diagnosis Date   Anemia     Anxiety     Arthritis      in both knees   Depression     GERD (gastroesophageal reflux disease)     Hyperlipidemia     Hypertension     Sleep apnea               Past Surgical History:  Procedure Laterality Date   CARPAL TUNNEL RELEASE Right     CATARACT EXTRACTION W/PHACO Right 02/19/2020    Procedure: CATARACT EXTRACTION PHACO AND INTRAOCULAR LENS PLACEMENT ([place]);  Surgeon: Baruch Goldmann, MD;  Location: [facility];  Service: Ophthalmology;  Laterality: Right;  CDE: 4.83   CESAREAN SECTION       cesarian   1990   COLONOSCOPY N/A 08/10/2016    Procedure: COLONOSCOPY;  Surgeon: Danie Binder, MD;  Location: [facility];  Service: Endoscopy;  Laterality: N/A;  10:30 AM   fallopian tube removed N/A      Social  History             Tobacco Use   Smoking status: Former      Packs/day: 1.00      Years: 30.00      Pack years: 30.00      Types: Cigarettes      Quit date: 09/27/2016      Years since quitting: 4.4   Smokeless tobacco: Never  Vaping Use   Vaping Use: Never used  Substance Use Topics   Alcohol use: Yes      Comment: rarely   Drug use: Not Currently      Comment: hx of marijuana use             Family History  Problem Relation Age of Onset   Cancer Mother          lung   Heart attack Father     Stroke Paternal Grandmother     Kidney failure Maternal Grandmother     Cancer Maternal Grandfather          lung   Seizures Brother     Sarcoidosis Sister        Current Outpatient Medications:  clonazePAM (KLONOPIN) 1 MG tablet, Take 1 mg by mouth 3 (three) times daily as needed for anxiety., Disp: , Rfl:    diclofenac Sodium (VOLTAREN) 1 % GEL, Apply 2 g topically 3 (three) times daily as needed (pain)., Disp: , Rfl:    ibuprofen (ADVIL) 800 MG tablet, Take 1 tablet (800 mg total) by mouth every 8 (eight) hours as needed for moderate pain., Disp: 90 tablet, Rfl: 5   meloxicam (MOBIC) 7.5 MG tablet, Take 1 tablet (7.5 mg total) by mouth daily., Disp: 30 tablet, Rfl: 5   pantoprazole (PROTONIX) 40 MG tablet, Take 40 mg by mouth daily., Disp: , Rfl:    phentermine 37.5 MG capsule, Take 37.5 mg by mouth daily., Disp: , Rfl:    telmisartan-hydrochlorothiazide (MICARDIS HCT) 80-25 MG tablet, Take 1 tablet by mouth daily., Disp: 30 tablet, Rfl: 5   Current Facility-Administered Medications:    bupivacaine-meloxicam ER (ZYNRELEF) injection 400 mg, 400 mg, Infiltration, Once, Carole Civil, MD     Body mass index is 39.06 kg/m.       The procedure has been fully reviewed with the patient; The risks and benefits of surgery have been discussed and explained and understood. Alternative treatment has also been reviewed, questions were encouraged and answered. The postoperative  plan is also been reviewed.   Left total knee

## 2021-03-25 ENCOUNTER — Telehealth: Payer: Self-pay | Admitting: Radiology

## 2021-03-25 NOTE — Telephone Encounter (Signed)
Michelle at [facility] called, said she was at pt's home.  LM on the voice mail, 104pm, and said that patient is having increased drainage seeping out of dressing, and she wanted the ok to change the dressing.  Please call her at [phone]- she had said in the message that she would likely be there til 2pm or so.  Just got the VM pulled.

## 2021-03-25 NOTE — Telephone Encounter (Signed)
Dr Aline Brochure likes to leave the bandage on when possible I called left message for her to call back

## 2021-03-26 ENCOUNTER — Telehealth: Payer: Self-pay | Admitting: Orthopedic Surgery

## 2021-03-26 ENCOUNTER — Ambulatory Visit (INDEPENDENT_AMBULATORY_CARE_PROVIDER_SITE_OTHER): Payer: 59 | Admitting: Orthopedic Surgery

## 2021-03-26 ENCOUNTER — Encounter: Payer: Self-pay | Admitting: Orthopedic Surgery

## 2021-03-26 ENCOUNTER — Other Ambulatory Visit: Payer: Self-pay

## 2021-03-26 DIAGNOSIS — M1712 Unilateral primary osteoarthritis, left knee: Secondary | ICD-10-CM

## 2021-03-26 DIAGNOSIS — Z96652 Presence of left artificial knee joint: Secondary | ICD-10-CM

## 2021-03-26 NOTE — Telephone Encounter (Signed)
I called, LM for Izora Gala to call me back.

## 2021-03-26 NOTE — Progress Notes (Signed)
Chief Complaint  Patient presents with   Post-op Follow-up    Left TKA DOS 03/18/21, numbness in distal leg    Jensine status post left total knee on the 20th she is postop day 9 she said her Aquacel dressing was soaked so we asked her to come in  We change the dressing it was soaked  No active bleeding  Her range of motion is improving to 90 degrees plus she has good passive extension slight lag in active extension  We gave her dressings to change the dressing daily and we will see her on a routine postop visit at postop day 1415 to take her staples out

## 2021-03-26 NOTE — Telephone Encounter (Signed)
Voice message received (1:37pm) from [place] at [facility] with question on order and on authorization. Please call [phone]

## 2021-03-26 NOTE — Telephone Encounter (Signed)
Done

## 2021-04-02 ENCOUNTER — Ambulatory Visit (INDEPENDENT_AMBULATORY_CARE_PROVIDER_SITE_OTHER): Payer: 59 | Admitting: Orthopedic Surgery

## 2021-04-02 ENCOUNTER — Other Ambulatory Visit: Payer: Self-pay

## 2021-04-02 ENCOUNTER — Encounter: Payer: Self-pay | Admitting: Orthopedic Surgery

## 2021-04-02 DIAGNOSIS — Z96652 Presence of left artificial knee joint: Secondary | ICD-10-CM

## 2021-04-02 DIAGNOSIS — R112 Nausea with vomiting, unspecified: Secondary | ICD-10-CM

## 2021-04-02 MED ORDER — PROMETHAZINE HCL 12.5 MG PO TABS: 12.5000 mg | ORAL_TABLET | ORAL | 0 refills | Status: DC | PRN

## 2021-04-02 MED ORDER — OXYCODONE-ACETAMINOPHEN 5-325 MG PO TABS
1.0000 | ORAL_TABLET | ORAL | 0 refills | Status: DC | PRN
Start: 2021-04-02 — End: 2021-04-09

## 2021-04-02 NOTE — Progress Notes (Signed)
Chief Complaint  Patient presents with   Post-op Follow-up    Left knee 03/18/21    Encounter Diagnosis  Name Primary?   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  The patient is here for wound check and possible staple removal  She has PT notes indicate her range of motion is 3 degrees - 111 degrees  We took out every other staple and had to abort that as the wound was trickling open so we will going to place some Steri-Strips on the superior aspect of the wound and see her next week to get the rest of the staples out  She will start therapy in [place]  She will get a refill on Phenergan and oxycodone for pain.  Meds ordered this encounter  Medications   oxyCODONE-acetaminophen (PERCOCET/ROXICET) 5-325 MG tablet    Sig: Take 1 tablet by mouth every 4 (four) hours as needed for up to 5 days for severe pain.    Dispense:  30 tablet    Refill:  0   promethazine (PHENERGAN) 12.5 MG tablet    Sig: Take 1 tablet (12.5 mg total) by mouth every 4 (four) hours as needed for nausea or vomiting. Take before percocet    Dispense:  30 tablet    Refill:  0

## 2021-04-03 ENCOUNTER — Telehealth: Payer: Self-pay

## 2021-04-03 NOTE — Telephone Encounter (Signed)
Clare Gandy therapist called saying patient has Vaseline on her wound and he wanted to be sure Dr. Aline Brochure was aware. His phone # is [phone]. Please call.

## 2021-04-03 NOTE — Telephone Encounter (Signed)
I called her to advise her not to use vaseline on the incision At least until the staples are out and it heals some more She has voiced understanding  To you FYI

## 2021-04-07 ENCOUNTER — Encounter (HOSPITAL_COMMUNITY): Payer: Self-pay | Admitting: Physical Therapy

## 2021-04-07 ENCOUNTER — Other Ambulatory Visit: Payer: Self-pay

## 2021-04-07 ENCOUNTER — Ambulatory Visit (HOSPITAL_COMMUNITY): Payer: 59 | Attending: Orthopedic Surgery | Admitting: Physical Therapy

## 2021-04-07 DIAGNOSIS — R2689 Other abnormalities of gait and mobility: Secondary | ICD-10-CM | POA: Diagnosis present

## 2021-04-07 DIAGNOSIS — R29898 Other symptoms and signs involving the musculoskeletal system: Secondary | ICD-10-CM | POA: Insufficient documentation

## 2021-04-07 DIAGNOSIS — M25562 Pain in left knee: Secondary | ICD-10-CM | POA: Diagnosis not present

## 2021-04-07 DIAGNOSIS — M6281 Muscle weakness (generalized): Secondary | ICD-10-CM | POA: Diagnosis present

## 2021-04-07 DIAGNOSIS — M25662 Stiffness of left knee, not elsewhere classified: Secondary | ICD-10-CM

## 2021-04-07 NOTE — Patient Instructions (Signed)
Access Code: [email or web address] URL: [email or web address] Date: 04/07/2021 Prepared by: Mitzi Hansen Rafik Koppel  Exercises Supine Quadricep Sets - 5 x daily - 7 x weekly - 1 sets - 10 reps - 5-10 second hold

## 2021-04-07 NOTE — Therapy (Signed)
[place], [place], [address] Phone: [phone]   Fax:  [phone]  Physical Therapy Evaluation  Patient Details  Name: Rachel [name] MRN: [id number] Date of Birth: [birth year] Referring Provider (PT): Arther Abbott MD   Encounter Date: 04/07/2021   PT End of Session - 04/07/21 1032     Visit Number 1    Number of Visits 12    Date for PT Re-Evaluation 05/19/21    Authorization Type [employer] (no auth, 30 visit limit)    Authorization - Visit Number 1    Authorization - Number of Visits 30    Progress Note Due on Visit 10    PT Start Time 1011   arrives late   PT Stop Time 1040    PT Time Calculation (min) 29 min    Activity Tolerance Patient tolerated treatment well    Behavior During Therapy WFL for tasks assessed/performed             Past Medical History:  Diagnosis Date   Anemia    Anxiety    Arthritis    in both knees   Depression    GERD (gastroesophageal reflux disease)    Hyperlipidemia    Hypertension    Sleep apnea     Past Surgical History:  Procedure Laterality Date   CARPAL TUNNEL RELEASE Right    CATARACT EXTRACTION W/PHACO Right 02/19/2020   Procedure: CATARACT EXTRACTION PHACO AND INTRAOCULAR LENS PLACEMENT ([place]);  Surgeon: Baruch Goldmann, MD;  Location: [facility];  Service: Ophthalmology;  Laterality: Right;  CDE: 4.83   CESAREAN SECTION     cesarian  1990   COLONOSCOPY N/A 08/10/2016   Procedure: COLONOSCOPY;  Surgeon: Danie Binder, MD;  [facility];  Service: Endoscopy;  Laterality: N/A;  10:30 AM   fallopian tube removed N/A    TOTAL KNEE ARTHROPLASTY Left 03/18/2021   Procedure: TOTAL KNEE ARTHROPLASTY;  Surgeon: Carole Civil, MD;  Location: [facility];  Service: Orthopedics;  Laterality: Left;    There were no vitals filed for this visit.    Subjective Assessment - 04/07/21 1015     Subjective Patient is a 60 y.o. female who presents to  physical therapy s/p L TKA on 03/18/21. She doesn't feel she is progressing fast enough. She just started walking with cane yesterday. Has been doing HHPT. Her main goals is to get strong enough to get back to work. She has been having trouble with walking, stairs.    Patient Stated Goals get stronger and get back to work    Currently in Pain? Yes    Pain Score 2     Pain Location Knee    Pain Orientation Left    Pain Descriptors / Indicators Nagging    Pain Type Surgical pain    Pain Onset 1 to 4 weeks ago    Pain Frequency Constant                [facility] PT Assessment - 04/07/21 0001       Assessment   Medical Diagnosis s/p L TKA    Referring Provider (PT) Arther Abbott MD    Onset Date/Surgical Date 03/18/21    Next MD Visit 04/09/21    Prior Therapy HHPT      Precautions   Precautions None      Restrictions   Weight Bearing Restrictions No      Balance Screen   Has the  patient fallen in the past 6 months No    Has the patient had a decrease in activity level because of a fear of falling?  No    Is the patient reluctant to leave their home because of a fear of falling?  No      Prior Function   Level of Independence Independent    Vocation Full time employment    Physicist, medical   Overall Cognitive Status Within Functional Limits for tasks assessed      Observation/Other Assessments   Observations Ambulates with antalgic gait with SPC; bandage over incision, staples intact    Focus on Therapeutic Outcomes (FOTO)  38% function      ROM / Strength   AROM / PROM / Strength AROM;Strength      AROM   AROM Assessment Site Knee    Right/Left Knee Right;Left    Right Knee Extension 0    Right Knee Flexion 136    Left Knee Extension 6   lacking   Left Knee Flexion 110      Strength   Strength Assessment Site Hip;Knee;Ankle    Right/Left Hip Right;Left    Right Hip Flexion 4+/5    Left Hip Flexion 4/5    Right/Left Knee  Right;Left    Right Knee Flexion 5/5    Right Knee Extension 5/5    Left Knee Flexion 3-/5    Left Knee Extension 4+/5    Right/Left Ankle Right;Left    Right Ankle Dorsiflexion 5/5    Left Ankle Dorsiflexion 5/5      Transfers   Comments labored, relies on RLE      Ambulation/Gait   Ambulation/Gait Yes    Ambulation/Gait Assistance 6: Modified independent (Device/Increase time)    Ambulation Distance (Feet) 100 Feet    Assistive device Straight cane    Gait Pattern Antalgic;Left flexed knee in stance;Decreased hip/knee flexion - left    Ambulation Surface Level;Indoor    Gait velocity decreased                        Objective measurements completed on examination: See above findings.       [facility] Adult PT Treatment/Exercise - 04/07/21 0001       Exercises   Exercises Knee/Hip      Knee/Hip Exercises: Supine   Quad Sets Left;1 set;10 reps    Quad Sets Limitations 5-10 second                     PT Education - 04/07/21 1011     Education Details Patient educated on exam findings, POC, scope of PT, HEP, elevation for edema    Person(s) Educated Patient    Methods Explanation;Demonstration;Handout    Comprehension Verbalized understanding;Returned demonstration              PT Short Term Goals - 04/07/21 1047       PT SHORT TERM GOAL #1   Title Patient will be independent with HEP in order to improve functional outcomes.    Time 3    Period Weeks    Status New    Target Date 04/28/21      PT SHORT TERM GOAL #2   Title Patient will report at least 25% improvement in symptoms for improved quality of life.    Time 3    Period Weeks    Status New  Target Date 04/28/21               PT Long Term Goals - 04/07/21 1047       PT LONG TERM GOAL #1   Title Patient will report at least 75% improvement in symptoms for improved quality of life.    Time 6    Period Weeks    Status New    Target Date 05/19/21      PT LONG  TERM GOAL #2   Title Patient will improve FOTO score by at least 30 points in order to indicate improved tolerance to activity.    Time 6    Period Weeks    Status New    Target Date 05/19/21      PT LONG TERM GOAL #3   Title Patient will be able to navigate stairs with reciprocal pattern without compensation in order to demonstrate improved LE strength.    Time 6    Period Weeks    Status New    Target Date 05/19/21      PT LONG TERM GOAL #4   Title Patient will improve ROM for left knee extension/flexion to 0-120 degrees to improve squatting, and other functional mobility.    Time 6    Period Weeks    Status New    Target Date 05/19/21                    Plan - 04/07/21 1043     Clinical Impression Statement Patient is a 60 y.o. female who presents to physical therapy s/p L TKA on 03/18/21. She presents with pain limited deficits in L knee strength, ROM, endurance, gait, balance, and functional mobility with ADL. She is having to modify and restrict ADL as indicated by FOTO score as well as subjective information and objective measures which is affecting overall participation. Patient will benefit from skilled physical therapy in order to improve function and reduce impairment.    Personal Factors and Comorbidities Comorbidity 3+;Fitness;Past/Current Experience    Comorbidities anxiety, depression, HTN, increased BMI    Examination-Activity Limitations Locomotion Level;Bend;Lift;Sleep;Squat;Stairs;Stand;Transfers    Examination-Participation Restrictions Cleaning;Occupation;Meal Prep;Community Activity;Shop;Volunteer;Dorita Sciara    Stability/Clinical Decision Making Stable/Uncomplicated    Clinical Decision Making Low    Rehab Potential Good    PT Frequency 2x / week    PT Duration 6 weeks    PT Treatment/Interventions ADLs/Self Care Home Management;Aquatic Therapy;Electrical Stimulation;Cryotherapy;Iontophoresis 4mg /ml Dexamethasone;Moist  Heat;Traction;Ultrasound;DME Instruction;Gait training;Stair training;Functional mobility training;Therapeutic activities;Therapeutic exercise;Balance training;Neuromuscular re-education;Patient/family education;Orthotic Fit/Training;Manual techniques;Manual lymph drainage;Compression bandaging;Scar mobilization;Passive range of motion;Dry needling;Energy conservation;Splinting;Taping    PT Next Visit Plan L knee mobility, L knee strength,  quad set, heel slides, SLR, bike, etc.  progress as able, f/u with compliance of HEP    PT Home Exercise Plan quad set, HHPT exercises    Consulted and Agree with Plan of Care Patient             Patient will benefit from skilled therapeutic intervention in order to improve the following deficits and impairments:  Abnormal gait, Decreased range of motion, Difficulty walking, Decreased endurance, Decreased activity tolerance, Pain, Decreased balance, Improper body mechanics, Impaired flexibility, Decreased mobility, Decreased strength, Increased edema  Visit Diagnosis: Left knee pain, unspecified chronicity  Stiffness of left knee, not elsewhere classified  Muscle weakness (generalized)  Other abnormalities of gait and mobility  Other symptoms and signs involving the musculoskeletal system     Problem List Patient Active Problem List   Diagnosis  Date Noted   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 03/18/2021   Primary osteoarthritis of left knee    Abnormal thyroid blood test 08/17/2018   Upper airway cough syndrome 10/11/2017   COPD GOLD 0  08/12/2017   Morbid obesity ([place]) 08/12/2017   Acute on chronic respiratory failure with hypoxemia (HCC)    ARDS (adult respiratory distress syndrome) ([place])    Influenza B 10/10/2016   Special screening for malignant neoplasms, colon    HYPERCHOLESTEROLEMIA 08/03/2009   BIPOLAR DISORDER UNSPECIFIED 08/03/2009   OBSESSIVE-COMPULSIVE DISORDER 08/03/2009   Essential hypertension, benign  08/03/2009   KNEE PAIN, BILATERAL 08/03/2009    10:50 AM, 04/07/21 Mearl Latin PT, DPT Physical Therapist at [facility] [place], [place], [address] Phone: [phone]   Fax:  [phone]  Name: JOSEE SPEECE MRN: [id number] Date of Birth: January 28, 1961

## 2021-04-09 ENCOUNTER — Encounter (HOSPITAL_COMMUNITY): Payer: Self-pay

## 2021-04-09 ENCOUNTER — Ambulatory Visit (INDEPENDENT_AMBULATORY_CARE_PROVIDER_SITE_OTHER): Payer: 59 | Admitting: Orthopedic Surgery

## 2021-04-09 ENCOUNTER — Encounter: Payer: Self-pay | Admitting: Orthopedic Surgery

## 2021-04-09 ENCOUNTER — Ambulatory Visit (HOSPITAL_COMMUNITY): Payer: 59

## 2021-04-09 ENCOUNTER — Other Ambulatory Visit: Payer: Self-pay

## 2021-04-09 DIAGNOSIS — M6281 Muscle weakness (generalized): Secondary | ICD-10-CM

## 2021-04-09 DIAGNOSIS — M25562 Pain in left knee: Secondary | ICD-10-CM | POA: Diagnosis not present

## 2021-04-09 DIAGNOSIS — Z96652 Presence of left artificial knee joint: Secondary | ICD-10-CM

## 2021-04-09 DIAGNOSIS — R112 Nausea with vomiting, unspecified: Secondary | ICD-10-CM

## 2021-04-09 DIAGNOSIS — M25662 Stiffness of left knee, not elsewhere classified: Secondary | ICD-10-CM

## 2021-04-09 MED ORDER — OXYCODONE-ACETAMINOPHEN 5-325 MG PO TABS: 1.0000 | ORAL_TABLET | ORAL | 0 refills | Status: AC | PRN

## 2021-04-09 NOTE — Therapy (Signed)
[place], [place], [address] Phone: [phone]   Fax:  [phone]  Physical Therapy Treatment  Patient Details  Name: Rachel [name] MRN: [id number] Date of Birth: 19-Feb-1961 Referring Provider (PT): Arther Abbott MD   Encounter Date: 04/09/2021   PT End of Session - 04/09/21 1017     Visit Number 2    Number of Visits 12    Date for PT Re-Evaluation 05/19/21    Authorization Type [employer] (no auth, 30 visit limit)    Authorization - Visit Number 2    Authorization - Number of Visits 30    Progress Note Due on Visit 10    PT Start Time 1011    PT Stop Time 1049    PT Time Calculation (min) 38 min    Activity Tolerance Patient tolerated treatment well    Behavior During Therapy WFL for tasks assessed/performed             Past Medical History:  Diagnosis Date   Anemia    Anxiety    Arthritis    in both knees   Depression    GERD (gastroesophageal reflux disease)    Hyperlipidemia    Hypertension    Sleep apnea     Past Surgical History:  Procedure Laterality Date   CARPAL TUNNEL RELEASE Right    CATARACT EXTRACTION W/PHACO Right 02/19/2020   Procedure: CATARACT EXTRACTION PHACO AND INTRAOCULAR LENS PLACEMENT ([place]);  Surgeon: Baruch Goldmann, MD;  Location: [facility];  Service: Ophthalmology;  Laterality: Right;  CDE: 4.83   CESAREAN SECTION     cesarian  1990   COLONOSCOPY N/A 08/10/2016   Procedure: COLONOSCOPY;  Surgeon: Danie Binder, MD;  Location: [facility];  Service: Endoscopy;  Laterality: N/A;  10:30 AM   fallopian tube removed N/A    TOTAL KNEE ARTHROPLASTY Left 03/18/2021   Procedure: TOTAL KNEE ARTHROPLASTY;  Surgeon: Carole Civil, MD;  Location: [facility];  Service: Orthopedics;  Laterality: Left;    There were no vitals filed for this visit.   Subjective Assessment - 04/09/21 1014     Subjective Pt reports having her staples removed earlier today, stated she  took pain meds prior session.  Pain scale 3/10 Lt knee.  Admits to not completeing her exercises as regularly prior to last session and has began HEP more regularly.    Patient Stated Goals get stronger and get back to work    Currently in Pain? Yes    Pain Score 3     Pain Location Knee    Pain Orientation Left    Pain Descriptors / Indicators Tender    Pain Type Surgical pain    Pain Onset 1 to 4 weeks ago    Pain Frequency Constant    Aggravating Factors  not taking medication regularly    Pain Relieving Factors stretch, rubbing it, elevation                               [facility] Adult PT Treatment/Exercise - 04/09/21 0001       Knee/Hip Exercises: Supine   Quad Sets Left;1 set;10 reps    Quad Sets Limitations 5" holds    Short Arc [employer] 10 reps    Heel Slides 10 reps    Bridges 10 reps    Straight Leg Raises Left;5 reps    Straight Leg Raises  Limitations quad set prior raise to address extension lag    Knee Extension AROM    Knee Extension Limitations 4    Knee Flexion AROM    Knee Flexion Limitations 108                     PT Education - 04/09/21 1024     Education Details Reviewed goals, educated importance of HEP compliance for maximal benefits.    Person(s) Educated Patient    Methods Explanation    Comprehension Verbalized understanding;Returned demonstration              PT Short Term Goals - 04/07/21 1047       PT SHORT TERM GOAL #1   Title Patient will be independent with HEP in order to improve functional outcomes.    Time 3    Period Weeks    Status New    Target Date 04/28/21      PT SHORT TERM GOAL #2   Title Patient will report at least 25% improvement in symptoms for improved quality of life.    Time 3    Period Weeks    Status New    Target Date 04/28/21               PT Long Term Goals - 04/07/21 1047       PT LONG TERM GOAL #1   Title Patient will report at least 75% improvement in  symptoms for improved quality of life.    Time 6    Period Weeks    Status New    Target Date 05/19/21      PT LONG TERM GOAL #2   Title Patient will improve FOTO score by at least 30 points in order to indicate improved tolerance to activity.    Time 6    Period Weeks    Status New    Target Date 05/19/21      PT LONG TERM GOAL #3   Title Patient will be able to navigate stairs with reciprocal pattern without compensation in order to demonstrate improved LE strength.    Time 6    Period Weeks    Status New    Target Date 05/19/21      PT LONG TERM GOAL #4   Title Patient will improve ROM for left knee extension/flexion to 0-120 degrees to improve squatting, and other functional mobility.    Time 6    Period Weeks    Status New    Target Date 05/19/21                   Plan - 04/09/21 1029     Clinical Impression Statement Reviewed goals, educated importance of HEP complinace for maximal benefits and discussed RICE technqiues for pain and edema control.  Educated importance of knee mobility to improve gait and pain control.  Therex focus on knee mobility and quad strengthening with good tolerance.  AROM 4-108.  Noted extension lag with SLR, encouraged quad activation prior raise and added to HEP.  Encouraged pt to complete 100 quad sets a day.  Manual techniques complete to address edema proximal knee prior rocking on bike for mobility.  Reports of decreased tightness at EOS.    Personal Factors and Comorbidities Comorbidity 3+;Fitness;Past/Current Experience    Comorbidities anxiety, depression, HTN, increased BMI    Examination-Activity Limitations Locomotion Level;Bend;Lift;Sleep;Squat;Stairs;Stand;Transfers    Examination-Participation Restrictions Cleaning;Occupation;Meal Prep;Community Activity;Shop;Volunteer;[employer] Leaver [facility]    Stability/Clinical Decision Making  Stable/Uncomplicated    Clinical Decision Making Low    Rehab Potential Good    PT Frequency 2x /  week    PT Duration 6 weeks    PT Treatment/Interventions ADLs/Self Care Home Management;Aquatic Therapy;Electrical Stimulation;Cryotherapy;Iontophoresis 4mg /ml Dexamethasone;Moist Heat;Traction;Ultrasound;DME Instruction;Gait training;Stair training;Functional mobility training;Therapeutic activities;Therapeutic exercise;Balance training;Neuromuscular re-education;Patient/family education;Orthotic Fit/Training;Manual techniques;Manual lymph drainage;Compression bandaging;Scar mobilization;Passive range of motion;Dry needling;Energy conservation;Splinting;Taping    PT Next Visit Plan L knee mobility, L knee strength,  quad set, heel slides, SLR, bike, etc.  progress as able, f/u with compliance of HEP    PT Home Exercise Plan quad set, HHPT exercises, 10/12: 100 quad sets and SLR    Consulted and Agree with Plan of Care Patient             Patient will benefit from skilled therapeutic intervention in order to improve the following deficits and impairments:  Abnormal gait, Decreased range of motion, Difficulty walking, Decreased endurance, Decreased activity tolerance, Pain, Decreased balance, Improper body mechanics, Impaired flexibility, Decreased mobility, Decreased strength, Increased edema  Visit Diagnosis: Left knee pain, unspecified chronicity  Stiffness of left knee, not elsewhere classified  Muscle weakness (generalized)     Problem List Patient Active Problem List   Diagnosis Date Noted   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 03/18/2021   Primary osteoarthritis of left knee    Abnormal thyroid blood test 08/17/2018   Upper airway cough syndrome 10/11/2017   COPD GOLD 0  08/12/2017   Morbid obesity ([place]) 08/12/2017   Acute on chronic respiratory failure with hypoxemia (HCC)    ARDS (adult respiratory distress syndrome) ([place])    Influenza B 10/10/2016   Special screening for malignant neoplasms, colon    HYPERCHOLESTEROLEMIA 08/03/2009   BIPOLAR DISORDER  UNSPECIFIED 08/03/2009   OBSESSIVE-COMPULSIVE DISORDER 08/03/2009   Essential hypertension, benign 08/03/2009   KNEE PAIN, BILATERAL 08/03/2009   Ihor Austin, LPTA/CLT; CBIS [phone]  Aldona Lento, PTA 04/09/2021, 3:23 PM  [facility] [address] [place], [place], [address] Phone: [phone]   Fax:  [phone]  Name: Rachel [name] MRN: [id number] Date of Birth: Aug 01, 1960

## 2021-04-09 NOTE — Patient Instructions (Signed)
Straight Leg Raise    Tighten stomach and slowly raise locked right leg ____ inches from floor. Repeat 10 times per set. Do 3 sets per session. Do 2 sessions per day.  http://orth.exer.us/1103   Copyright  VHI. All rights reserved.

## 2021-04-09 NOTE — Progress Notes (Signed)
Chief Complaint  Patient presents with   Post-op Follow-up    03/18/21 left knee replaced / remaining staples removed     Encounter Diagnoses  Name Primary?   Nausea and vomiting, unspecified vomiting type    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 Yes    Rachel [name] came in today postop day #22.  We had taken half of her staples out last week and we took the other half out today her wound looks good  She is concerned she is not progressing well in physical therapy but she is already progressed to a cane she has 110 degrees of flexion and 0 degrees extension in her left knee  She has no signs of infection or DVT so I would advise her to continue with her oxycodone and Phenergan and continue with her physical therapy she has had 1 visit so far has another one today and I will see her in 2 to 3 weeks to determine when she can go back to work.  She wants to go back in 6 weeks were not sure at this time

## 2021-04-17 ENCOUNTER — Telehealth: Payer: Self-pay | Admitting: Radiology

## 2021-04-17 ENCOUNTER — Other Ambulatory Visit: Payer: Self-pay | Admitting: Orthopedic Surgery

## 2021-04-17 DIAGNOSIS — Z96652 Presence of left artificial knee joint: Secondary | ICD-10-CM

## 2021-04-17 MED ORDER — OXYCODONE-ACETAMINOPHEN 7.5-325 MG PO TABS
1.0000 | ORAL_TABLET | Freq: Four times a day (QID) | ORAL | 0 refills | Status: AC | PRN
Start: 2021-04-17 — End: 2021-04-22

## 2021-04-17 NOTE — Telephone Encounter (Signed)
Patient called, requested refill hydrocodone.  I do not see it in her active meds to prompt refill.   [facility].

## 2021-04-22 ENCOUNTER — Other Ambulatory Visit: Payer: Self-pay

## 2021-04-22 ENCOUNTER — Ambulatory Visit (HOSPITAL_COMMUNITY): Payer: 59 | Admitting: Physical Therapy

## 2021-04-22 ENCOUNTER — Encounter (HOSPITAL_COMMUNITY): Payer: Self-pay | Admitting: Physical Therapy

## 2021-04-22 DIAGNOSIS — R29898 Other symptoms and signs involving the musculoskeletal system: Secondary | ICD-10-CM

## 2021-04-22 DIAGNOSIS — M25562 Pain in left knee: Secondary | ICD-10-CM | POA: Diagnosis not present

## 2021-04-22 DIAGNOSIS — M6281 Muscle weakness (generalized): Secondary | ICD-10-CM

## 2021-04-22 DIAGNOSIS — M25662 Stiffness of left knee, not elsewhere classified: Secondary | ICD-10-CM

## 2021-04-22 DIAGNOSIS — R2689 Other abnormalities of gait and mobility: Secondary | ICD-10-CM

## 2021-04-22 NOTE — Patient Instructions (Signed)
Access Code: [email or web address] URL: [email or web address] Date: 04/22/2021 Prepared by: Mitzi Hansen Berdell Hostetler  Exercises Seated Long Arc Quad (Mirrored) - 2 x daily - 7 x weekly - 1 sets - 10 reps - 5 second hold Standing Hip Abduction with Counter Support - 1 x daily - 7 x weekly - 2 sets - 10 reps Mini Squat with Counter Support - 1 x daily - 7 x weekly - 2 sets - 10 reps

## 2021-04-22 NOTE — Therapy (Signed)
[place], [place], [address] Phone: [phone]   Fax:  [phone]  Physical Therapy Treatment  Patient Details  Name: Rachel [name] MRN: [id number] Date of Birth: 24-Sep-1960 Referring Provider (PT): Arther Abbott MD   Encounter Date: 04/22/2021   PT End of Session - 04/22/21 1404     Visit Number 3    Number of Visits 12    Date for PT Re-Evaluation 05/19/21    Authorization Type [employer] (no auth, 30 visit limit)    Authorization - Visit Number 3    Authorization - Number of Visits 30    Progress Note Due on Visit 10    PT Start Time 1404    PT Stop Time 1442    PT Time Calculation (min) 38 min    Activity Tolerance Patient tolerated treatment well    Behavior During Therapy WFL for tasks assessed/performed             Past Medical History:  Diagnosis Date   Anemia    Anxiety    Arthritis    in both knees   Depression    GERD (gastroesophageal reflux disease)    Hyperlipidemia    Hypertension    Sleep apnea     Past Surgical History:  Procedure Laterality Date   CARPAL TUNNEL RELEASE Right    CATARACT EXTRACTION W/PHACO Right 02/19/2020   Procedure: CATARACT EXTRACTION PHACO AND INTRAOCULAR LENS PLACEMENT ([place]);  Surgeon: Baruch Goldmann, MD;  Location: [facility];  Service: Ophthalmology;  Laterality: Right;  CDE: 4.83   CESAREAN SECTION     cesarian  1990   COLONOSCOPY N/A 08/10/2016   Procedure: COLONOSCOPY;  Surgeon: Danie Binder, MD;  [facility];  Service: Endoscopy;  Laterality: N/A;  10:30 AM   fallopian tube removed N/A    TOTAL KNEE ARTHROPLASTY Left 03/18/2021   Procedure: TOTAL KNEE ARTHROPLASTY;  Surgeon: Carole Civil, MD;  Location: [facility];  Service: Orthopedics;  Laterality: Left;    There were no vitals filed for this visit.   Subjective Assessment - 04/22/21 1405     Subjective Patient states she did alot of cleaning and she might have over  did it. Making slow progress.    Patient Stated Goals get stronger and get back to work    Currently in Pain? Yes    Pain Score 2     Pain Location Knee    Pain Orientation Left    Pain Descriptors / Indicators --   annoying   Pain Type Surgical pain    Pain Onset 1 to 4 weeks ago    Pain Frequency Constant                               [facility] Adult PT Treatment/Exercise - 04/22/21 0001       Knee/Hip Exercises: Aerobic   Recumbent Bike 5 minutes for dynamic warm up and ROM      Knee/Hip Exercises: Standing   Heel Raises Both;2 sets;10 reps    Hip Abduction Both;2 sets;10 reps    Functional Squat 10 reps      Knee/Hip Exercises: Seated   Long Arc Quad Left;10 reps    Long Arc Quad Limitations 5 second holds      Knee/Hip Exercises: Supine   Quad Sets Left;1 set;10 reps    [employer] Limitations 5 second holds  Straight Leg Raises Left;10 reps;3 sets    Straight Leg Raises Limitations quad set prior raise to address extension lag    Knee Extension AROM    Knee Extension Limitations 0    Knee Flexion AROM    Knee Flexion Limitations 122                     PT Education - 04/22/21 1404     Education Details HEP    Person(s) Educated Patient    Methods Explanation    Comprehension Verbalized understanding              PT Short Term Goals - 04/07/21 1047       PT SHORT TERM GOAL #1   Title Patient will be independent with HEP in order to improve functional outcomes.    Time 3    Period Weeks    Status New    Target Date 04/28/21      PT SHORT TERM GOAL #2   Title Patient will report at least 25% improvement in symptoms for improved quality of life.    Time 3    Period Weeks    Status New    Target Date 04/28/21               PT Long Term Goals - 04/07/21 1047       PT LONG TERM GOAL #1   Title Patient will report at least 75% improvement in symptoms for improved quality of life.    Time 6    Period Weeks     Status New    Target Date 05/19/21      PT LONG TERM GOAL #2   Title Patient will improve FOTO score by at least 30 points in order to indicate improved tolerance to activity.    Time 6    Period Weeks    Status New    Target Date 05/19/21      PT LONG TERM GOAL #3   Title Patient will be able to navigate stairs with reciprocal pattern without compensation in order to demonstrate improved LE strength.    Time 6    Period Weeks    Status New    Target Date 05/19/21      PT LONG TERM GOAL #4   Title Patient will improve ROM for left knee extension/flexion to 0-120 degrees to improve squatting, and other functional mobility.    Time 6    Period Weeks    Status New    Target Date 05/19/21                   Plan - 04/22/21 1404     Clinical Impression Statement Patient continues to ambulate with antalgic gait pattern with limited knee flexion/extension ROM with antalgic gait. Began session on bike for dynamic warm up and ROM. She is demonstrating good ROM from 0 - 122. She continues to demonstrate quad weakness with extension lag with SLR.  She requires frequent cueing for posture and avoiding compensations with fair carry over. Patient will continue to benefit from skilled physical therapy in order to reduce impairment and improve function.    Personal Factors and Comorbidities Comorbidity 3+;Fitness;Past/Current Experience    Comorbidities anxiety, depression, HTN, increased BMI    Examination-Activity Limitations Locomotion Level;Bend;Lift;Sleep;Squat;Stairs;Stand;Transfers    Examination-Participation Restrictions Cleaning;Occupation;Meal Prep;Community Activity;Shop;Volunteer;[facility]    Stability/Clinical Decision Making Stable/Uncomplicated    Rehab Potential Good    PT Frequency 2x /  week    PT Duration 6 weeks    PT Treatment/Interventions ADLs/Self Care Home Management;Aquatic Therapy;Electrical Stimulation;Cryotherapy;Iontophoresis 4mg /ml  Dexamethasone;Moist Heat;Traction;Ultrasound;DME Instruction;Gait training;Stair training;Functional mobility training;Therapeutic activities;Therapeutic exercise;Balance training;Neuromuscular re-education;Patient/family education;Orthotic Fit/Training;Manual techniques;Manual lymph drainage;Compression bandaging;Scar mobilization;Passive range of motion;Dry needling;Energy conservation;Splinting;Taping    PT Next Visit Plan L knee mobility, L knee strength,  quad set, heel slides, SLR, bike, etc.  progress as able, f/u with compliance of HEP    PT Home Exercise Plan quad set, HHPT exercises, 10/12: 100 quad sets and SLR 10/25 mini squat, LAQ, hip abd    Consulted and Agree with Plan of Care Patient             Patient will benefit from skilled therapeutic intervention in order to improve the following deficits and impairments:  Abnormal gait, Decreased range of motion, Difficulty walking, Decreased endurance, Decreased activity tolerance, Pain, Decreased balance, Improper body mechanics, Impaired flexibility, Decreased mobility, Decreased strength, Increased edema  Visit Diagnosis: Left knee pain, unspecified chronicity  Stiffness of left knee, not elsewhere classified  Muscle weakness (generalized)  Other abnormalities of gait and mobility  Other symptoms and signs involving the musculoskeletal system     Problem List Patient Active Problem List   Diagnosis Date Noted   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 03/18/2021   Primary osteoarthritis of left knee    Abnormal thyroid blood test 08/17/2018   Upper airway cough syndrome 10/11/2017   COPD GOLD 0  08/12/2017   Morbid obesity ([place]) 08/12/2017   Acute on chronic respiratory failure with hypoxemia (HCC)    ARDS (adult respiratory distress syndrome) ([place])    Influenza B 10/10/2016   Special screening for malignant neoplasms, colon    HYPERCHOLESTEROLEMIA 08/03/2009   BIPOLAR DISORDER UNSPECIFIED 08/03/2009    OBSESSIVE-COMPULSIVE DISORDER 08/03/2009   Essential hypertension, benign 08/03/2009   KNEE PAIN, BILATERAL 08/03/2009    2:47 PM, 04/22/21 Mearl Latin PT, DPT Physical Therapist at [facility] [place], [place], [address] Phone: [phone]   Fax:  [phone]  Name: MARLISA CARIDI MRN: [id number] Date of Birth: June 27, 1961

## 2021-04-24 ENCOUNTER — Ambulatory Visit (HOSPITAL_COMMUNITY): Payer: 59 | Admitting: Physical Therapy

## 2021-04-24 ENCOUNTER — Other Ambulatory Visit: Payer: Self-pay | Admitting: Orthopedic Surgery

## 2021-04-24 ENCOUNTER — Other Ambulatory Visit: Payer: Self-pay

## 2021-04-24 ENCOUNTER — Encounter (HOSPITAL_COMMUNITY): Payer: Self-pay | Admitting: Physical Therapy

## 2021-04-24 DIAGNOSIS — R29898 Other symptoms and signs involving the musculoskeletal system: Secondary | ICD-10-CM

## 2021-04-24 DIAGNOSIS — M25662 Stiffness of left knee, not elsewhere classified: Secondary | ICD-10-CM

## 2021-04-24 DIAGNOSIS — M6281 Muscle weakness (generalized): Secondary | ICD-10-CM

## 2021-04-24 DIAGNOSIS — M25562 Pain in left knee: Secondary | ICD-10-CM | POA: Diagnosis not present

## 2021-04-24 DIAGNOSIS — R2689 Other abnormalities of gait and mobility: Secondary | ICD-10-CM

## 2021-04-24 NOTE — Therapy (Signed)
[place], [place], [address] Phone: [phone]   Fax:  [phone]  Physical Therapy Treatment  Patient Details  Name: Rachel [name] MRN: [id number] Date of Birth: 1961-04-09 Referring Provider (PT): Arther Abbott MD   Encounter Date: 04/24/2021   PT End of Session - 04/24/21 1313     Visit Number 4    Number of Visits 12    Date for PT Re-Evaluation 05/19/21    Authorization Type [employer] (no auth, 30 visit limit)    Authorization - Visit Number 4    Authorization - Number of Visits 30    Progress Note Due on Visit 10    PT Start Time 1315    PT Stop Time 1353    PT Time Calculation (min) 38 min    Activity Tolerance Patient tolerated treatment well    Behavior During Therapy WFL for tasks assessed/performed             Past Medical History:  Diagnosis Date   Anemia    Anxiety    Arthritis    in both knees   Depression    GERD (gastroesophageal reflux disease)    Hyperlipidemia    Hypertension    Sleep apnea     Past Surgical History:  Procedure Laterality Date   CARPAL TUNNEL RELEASE Right    CATARACT EXTRACTION W/PHACO Right 02/19/2020   Procedure: CATARACT EXTRACTION PHACO AND INTRAOCULAR LENS PLACEMENT ([place]);  Surgeon: Baruch Goldmann, MD;  Location: [facility];  Service: Ophthalmology;  Laterality: Right;  CDE: 4.83   CESAREAN SECTION     cesarian  1990   COLONOSCOPY N/A 08/10/2016   Procedure: COLONOSCOPY;  Surgeon: Danie Binder, MD;  Location: [facility];  Service: Endoscopy;  Laterality: N/A;  10:30 AM   fallopian tube removed N/A    TOTAL KNEE ARTHROPLASTY Left 03/18/2021   Procedure: TOTAL KNEE ARTHROPLASTY;  Surgeon: Carole Civil, MD;  Location: [facility];  Service: Orthopedics;  Laterality: Left;    There were no vitals filed for this visit.   Subjective Assessment - 04/24/21 1314     Subjective Patient states she gets sharp pains when stainding on it.     Patient Stated Goals get stronger and get back to work    Currently in Pain? Yes    Pain Score 3     Pain Location Knee    Pain Orientation Left    Pain Descriptors / Indicators Sharp;Pressure    Pain Type Surgical pain    Pain Onset 1 to 4 weeks ago    Pain Frequency Constant                               [facility] Adult PT Treatment/Exercise - 04/24/21 0001       Knee/Hip Exercises: Aerobic   Recumbent Bike 5 minutes for dynamic warm up and ROM      Knee/Hip Exercises: Standing   Heel Raises Both;2 sets;15 reps    Knee Flexion Left;2 sets;15 reps    Hip Abduction Both;2 sets;10 reps    Forward Step Up Left;2 sets;10 reps;Hand Hold: 2;Step Height: 4"      Knee/Hip Exercises: Seated   Long Arc Quad Left;10 reps    Long Arc Quad Limitations 5 second holds    Other Seated Knee/Hip Exercises hamstring iso 10 x 5 second holds with green ball  Sit to [employer] 10 reps                     PT Education - 04/24/21 1314     Education Details HEP    Person(s) Educated Patient    Methods Explanation;Demonstration    Comprehension Verbalized understanding;Returned demonstration              PT Short Term Goals - 04/07/21 1047       PT SHORT TERM GOAL #1   Title Patient will be independent with HEP in order to improve functional outcomes.    Time 3    Period Weeks    Status New    Target Date 04/28/21      PT SHORT TERM GOAL #2   Title Patient will report at least 25% improvement in symptoms for improved quality of life.    Time 3    Period Weeks    Status New    Target Date 04/28/21               PT Long Term Goals - 04/07/21 1047       PT LONG TERM GOAL #1   Title Patient will report at least 75% improvement in symptoms for improved quality of life.    Time 6    Period Weeks    Status New    Target Date 05/19/21      PT LONG TERM GOAL #2   Title Patient will improve FOTO score by at least 30 points in order to indicate  improved tolerance to activity.    Time 6    Period Weeks    Status New    Target Date 05/19/21      PT LONG TERM GOAL #3   Title Patient will be able to navigate stairs with reciprocal pattern without compensation in order to demonstrate improved LE strength.    Time 6    Period Weeks    Status New    Target Date 05/19/21      PT LONG TERM GOAL #4   Title Patient will improve ROM for left knee extension/flexion to 0-120 degrees to improve squatting, and other functional mobility.    Time 6    Period Weeks    Status New    Target Date 05/19/21                   Plan - 04/24/21 1313     Clinical Impression Statement Patient able to complete full revolutions on bike today showing improving mobility. She requires intermittent cueing for mechanics of exercises with good/fair carry over. Able to begin stair exercises today which was tolerated well. Notes moderate fatigue at end of session.  Patient will continue to benefit from skilled physical therapy in order to reduce impairment and improve function.    Personal Factors and Comorbidities Comorbidity 3+;Fitness;Past/Current Experience    Comorbidities anxiety, depression, HTN, increased BMI    Examination-Activity Limitations Locomotion Level;Bend;Lift;Sleep;Squat;Stairs;Stand;Transfers    Examination-Participation Restrictions Cleaning;Occupation;Meal Prep;Community Activity;Shop;Volunteer;Valla Leaver [facility]    Stability/Clinical Decision Making Stable/Uncomplicated    Rehab Potential Good    PT Frequency 2x / week    PT Duration 6 weeks    PT Treatment/Interventions ADLs/Self Care Home Management;Aquatic Therapy;Electrical Stimulation;Cryotherapy;Iontophoresis 4mg /ml Dexamethasone;Moist Heat;Traction;Ultrasound;DME Instruction;Gait training;Stair training;Functional mobility training;Therapeutic activities;Therapeutic exercise;Balance training;Neuromuscular re-education;Patient/family education;Orthotic Fit/Training;Manual  techniques;Manual lymph drainage;Compression bandaging;Scar mobilization;Passive range of motion;Dry needling;Energy conservation;Splinting;Taping    PT Next Visit Plan L knee mobility, L knee strength,  quad  set, heel slides, SLR, bike, etc.  progress as able, f/u with compliance of HEP    PT Home Exercise Plan quad set, HHPT exercises, 10/12: 100 quad sets and SLR 10/25 mini squat, LAQ, hip abd 10/27 STS    Consulted and Agree with Plan of Care Patient             Patient will benefit from skilled therapeutic intervention in order to improve the following deficits and impairments:  Abnormal gait, Decreased range of motion, Difficulty walking, Decreased endurance, Decreased activity tolerance, Pain, Decreased balance, Improper body mechanics, Impaired flexibility, Decreased mobility, Decreased strength, Increased edema  Visit Diagnosis: Left knee pain, unspecified chronicity  Stiffness of left knee, not elsewhere classified  Muscle weakness (generalized)  Other abnormalities of gait and mobility  Other symptoms and signs involving the musculoskeletal system     Problem List Patient Active Problem List   Diagnosis Date Noted   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 03/18/2021   Primary osteoarthritis of left knee    Abnormal thyroid blood test 08/17/2018   Upper airway cough syndrome 10/11/2017   COPD GOLD 0  08/12/2017   Morbid obesity ([place]) 08/12/2017   Acute on chronic respiratory failure with hypoxemia (HCC)    ARDS (adult respiratory distress syndrome) ([place])    Influenza B 10/10/2016   Special screening for malignant neoplasms, colon    HYPERCHOLESTEROLEMIA 08/03/2009   BIPOLAR DISORDER UNSPECIFIED 08/03/2009   OBSESSIVE-COMPULSIVE DISORDER 08/03/2009   Essential hypertension, benign 08/03/2009   KNEE PAIN, BILATERAL 08/03/2009    1:57 PM, 04/24/21 Mearl Latin PT, DPT Physical Therapist at [place], [place], [address] Phone: [phone]   Fax:  [phone]  Name: Rachel [name] MRN: [id number] Date of Birth: May 27, 1961

## 2021-04-24 NOTE — Patient Instructions (Signed)
Access Code: [email or web address] URL: [email or web address] Date: 04/24/2021 Prepared by: Mitzi Hansen Tylee Yum  Exercises Sit to Stand with Arms Crossed - 1 x daily - 7 x weekly - 2 sets - 10 reps

## 2021-04-24 NOTE — Telephone Encounter (Signed)
Patient left a voice message this afternoon, 04/24/21 for refill: (post op 03/18/21) oxyCODONE-acetaminophen (PERCOCET) 7.5-325 MG tablet - 20 -[place]

## 2021-04-28 ENCOUNTER — Encounter: Payer: Self-pay | Admitting: Orthopedic Surgery

## 2021-04-28 MED ORDER — OXYCODONE-ACETAMINOPHEN 7.5-325 MG PO TABS: 1.0000 | ORAL_TABLET | Freq: Four times a day (QID) | ORAL | 0 refills | Status: AC | PRN

## 2021-04-29 ENCOUNTER — Other Ambulatory Visit: Payer: Self-pay

## 2021-04-29 ENCOUNTER — Encounter (HOSPITAL_COMMUNITY): Payer: Self-pay

## 2021-04-29 ENCOUNTER — Ambulatory Visit (HOSPITAL_COMMUNITY): Payer: 59 | Attending: Orthopedic Surgery

## 2021-04-29 DIAGNOSIS — R29898 Other symptoms and signs involving the musculoskeletal system: Secondary | ICD-10-CM | POA: Diagnosis present

## 2021-04-29 DIAGNOSIS — M25662 Stiffness of left knee, not elsewhere classified: Secondary | ICD-10-CM | POA: Diagnosis present

## 2021-04-29 DIAGNOSIS — M6281 Muscle weakness (generalized): Secondary | ICD-10-CM | POA: Diagnosis present

## 2021-04-29 DIAGNOSIS — R2689 Other abnormalities of gait and mobility: Secondary | ICD-10-CM | POA: Diagnosis present

## 2021-04-29 DIAGNOSIS — M25562 Pain in left knee: Secondary | ICD-10-CM | POA: Diagnosis present

## 2021-04-29 NOTE — Therapy (Signed)
[place], [place], [address] Phone: [phone]   Fax:  [phone]  Physical Therapy Treatment # OF FEET WALKED: 343ft no AD ROM:  Flexion: 124 degrees            Extension: 0 degreses  Patient Details  Name: Rachel [name] MRN: [id number] Date of Birth: 1960-12-26 Referring Provider (PT): Arther Abbott MD   Encounter Date: 04/29/2021   PT End of Session - 04/29/21 1322     Visit Number 5    Number of Visits 12    Date for PT Re-Evaluation 05/19/21    Authorization Type [employer] (no auth, 30 visit limit)    Authorization - Visit Number 5    Authorization - Number of Visits 30    Progress Note Due on Visit 10    PT Start Time 1316    PT Stop Time 1355    PT Time Calculation (min) 39 min    Activity Tolerance Patient tolerated treatment well    Behavior During Therapy WFL for tasks assessed/performed             Past Medical History:  Diagnosis Date   Anemia    Anxiety    Arthritis    in both knees   Depression    GERD (gastroesophageal reflux disease)    Hyperlipidemia    Hypertension    Sleep apnea     Past Surgical History:  Procedure Laterality Date   CARPAL TUNNEL RELEASE Right    CATARACT EXTRACTION W/PHACO Right 02/19/2020   Procedure: CATARACT EXTRACTION PHACO AND INTRAOCULAR LENS PLACEMENT ([place]);  Surgeon: Baruch Goldmann, MD;  Location: [facility];  Service: Ophthalmology;  Laterality: Right;  CDE: 4.83   CESAREAN SECTION     cesarian  1990   COLONOSCOPY N/A 08/10/2016   Procedure: COLONOSCOPY;  Surgeon: Danie Binder, MD;  [facility];  Service: Endoscopy;  Laterality: N/A;  10:30 AM   fallopian tube removed N/A    TOTAL KNEE ARTHROPLASTY Left 03/18/2021   Procedure: TOTAL KNEE ARTHROPLASTY;  Surgeon: Carole Civil, MD;  Location: [facility];  Service: Orthopedics;  Laterality: Left;    There were no vitals filed for this visit.   Subjective Assessment -  04/29/21 1321     Subjective Pt stated she has been working on her pretty walking.  No real pain, some discomfort.  Reports compliance with HEP daily.    Patient Stated Goals get stronger and get back to work    Currently in Pain? No/denies                [facility] PT Assessment - 04/29/21 0001       Assessment   Medical Diagnosis s/p L TKA    Referring Provider (PT) Arther Abbott MD    Onset Date/Surgical Date 03/18/21    Next MD Visit 04/30/21      AROM   Left Knee Extension 0   was 6   Left Knee Flexion 124   was 110     Strength   Right Hip Flexion 4+/5    Left Hip Flexion 4/5    Left Knee Flexion 4/5    Left Knee Extension 4-/5    Left Ankle Dorsiflexion 4+/5      Ambulation/Gait   Ambulation/Gait Yes    Ambulation/Gait Assistance 6: Modified independent (Device/Increase time)    Assistive device None    Gait Pattern Decreased dorsiflexion - left;Decreased stance  time - left;Decreased step length - right;Step-through pattern;Poor foot clearance - left    Ambulation Surface Level;Indoor    Gait velocity decreased    Stairs Yes    Stairs Assistance 5: Supervision;4: Min guard    Stair Management Technique Alternating pattern;One rail Left    Number of Stairs 7    Height of Stairs 4    Gait Comments 2MWT      Standardized Balance Assessment   Standardized Balance Assessment Five Times Sit to Stand    Five times sit to stand comments  10.96" no HHA 5STS                           [facility] Adult PT Treatment/Exercise - 04/29/21 0001       Ambulation/Gait   Ambulation Distance (Feet) 326 Feet   was 151ft initial eval 2MWT     Knee/Hip Exercises: Aerobic   Recumbent Bike 5 minutes for dynamic warm up and ROM seat 8      Knee/Hip Exercises: Standing   Heel Raises Both;2 sets;15 reps    Heel Raises Limitations Toe raises incline slope    Lateral Step Up Left;10 reps;Hand Hold: 2;Step Height: 4";2 sets    Lateral Step Up Limitations cueing for  mechanics    Forward Step Up Left;2 sets;10 reps;Hand Hold: 2;Step Height: 4"    Gait Training 2MWT no AD      Knee/Hip Exercises: Seated   Sit to Sand 5 reps;without UE support   timed 5 STS     Knee/Hip Exercises: Supine   Knee Extension Limitations 0    Knee Flexion Limitations 124                       PT Short Term Goals - 04/29/21 1322       PT SHORT TERM GOAL #1   Title Patient will be independent with HEP in order to improve functional outcomes.    Baseline 04/29/21:  Reports compliance wiht HEP daily.    Status Achieved      PT SHORT TERM GOAL #2   Title Patient will report at least 25% improvement in symptoms for improved quality of life.    Baseline 04/29/21:  Reports improvements by 60%.    Status Achieved               PT Long Term Goals - 04/29/21 1324       PT LONG TERM GOAL #1   Title Patient will report at least 75% improvement in symptoms for improved quality of life.    Baseline 04/29/21:  Reports improvements by 60%.      PT LONG TERM GOAL #2   Title Patient will improve FOTO score by at least 30 points in order to indicate improved tolerance to activity.    Baseline 04/29/21:  not complete this session      PT LONG TERM GOAL #3   Title Patient will be able to navigate stairs with reciprocal pattern without compensation in order to demonstrate improved LE strength.      PT LONG TERM GOAL #4   Title Patient will improve ROM for left knee extension/flexion to 0-120 degrees to improve squatting, and other functional mobility.    Baseline 04/29/21: 0-124 degrees    Status Achieved                   Plan - 04/29/21 1406  Clinical Impression Statement Reviewed goals prior MD apt tomorrpw.  Pt progressing well towards with reports of 60% improvements.  Pt reports compliance wiht HEP daily.  Improvements with distance and mechanics wiht 2MWT without AD, cueing to improve dorsiflexion with heel strike and opposite UE swing to  normalize mechanics, AROM 0-124 degrees, 5STS in 10.96" with no HHA required.  MMT revealed weakness especially in quadriceps.  Pt will continue to benefits with therapy to address quad weakness to improve gait mechanics and functional strenghtening with steps.    Personal Factors and Comorbidities Comorbidity 3+;Fitness;Past/Current Experience    Comorbidities anxiety, depression, HTN, increased BMI    Examination-Activity Limitations Locomotion Level;Bend;Lift;Sleep;Squat;Stairs;Stand;Transfers    Examination-Participation Restrictions Cleaning;Occupation;Meal Prep;Community Activity;Shop;Volunteer;Dorita Sciara    Stability/Clinical Decision Making Stable/Uncomplicated    Clinical Decision Making Low    Rehab Potential Good    PT Frequency 2x / week    PT Duration 6 weeks    PT Treatment/Interventions ADLs/Self Care Home Management;Aquatic Therapy;Electrical Stimulation;Cryotherapy;Iontophoresis 4mg /ml Dexamethasone;Moist Heat;Traction;Ultrasound;DME Instruction;Gait training;Stair training;Functional mobility training;Therapeutic activities;Therapeutic exercise;Balance training;Neuromuscular re-education;Patient/family education;Orthotic Fit/Training;Manual techniques;Manual lymph drainage;Compression bandaging;Scar mobilization;Passive range of motion;Dry needling;Energy conservation;Splinting;Taping    PT Next Visit Plan L knee mobility, L knee strength,  Next session add rocker board, step training, SLS activities; progress as able, f/u with compliance of HEP    PT Home Exercise Plan quad set, HHPT exercises, 10/12: 100 quad sets and SLR 10/25 mini squat, LAQ, hip abd 10/27 STS    Consulted and Agree with Plan of Care Patient             Patient will benefit from skilled therapeutic intervention in order to improve the following deficits and impairments:  Abnormal gait, Decreased range of motion, Difficulty walking, Decreased endurance, Decreased activity tolerance, Pain, Decreased  balance, Improper body mechanics, Impaired flexibility, Decreased mobility, Decreased strength, Increased edema  Visit Diagnosis: Left knee pain, unspecified chronicity  Stiffness of left knee, not elsewhere classified  Muscle weakness (generalized)     Problem List Patient Active Problem List   Diagnosis Date Noted   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 03/18/2021   Primary osteoarthritis of left knee    Abnormal thyroid blood test 08/17/2018   Upper airway cough syndrome 10/11/2017   COPD GOLD 0  08/12/2017   Morbid obesity ([place]) 08/12/2017   Acute on chronic respiratory failure with hypoxemia (HCC)    ARDS (adult respiratory distress syndrome) ([place])    Influenza B 10/10/2016   Special screening for malignant neoplasms, colon    HYPERCHOLESTEROLEMIA 08/03/2009   BIPOLAR DISORDER UNSPECIFIED 08/03/2009   OBSESSIVE-COMPULSIVE DISORDER 08/03/2009   Essential hypertension, benign 08/03/2009   KNEE PAIN, BILATERAL 08/03/2009   Rachel [name], LPTA/CLT; CBIS [id number]  Aldona Lento, PTA 04/29/2021, 2:46 PM  [place], [place], [address] Phone: [phone]   Fax:  [phone]  Name: KEEARA FREES MRN: [id number] Date of Birth: [birth year]

## 2021-04-30 ENCOUNTER — Encounter: Payer: Self-pay | Admitting: Orthopedic Surgery

## 2021-04-30 ENCOUNTER — Ambulatory Visit (INDEPENDENT_AMBULATORY_CARE_PROVIDER_SITE_OTHER): Payer: 59 | Admitting: Orthopedic Surgery

## 2021-04-30 DIAGNOSIS — Z96652 Presence of left artificial knee joint: Secondary | ICD-10-CM

## 2021-04-30 NOTE — Patient Instructions (Signed)
Rtw nov 28

## 2021-04-30 NOTE — Progress Notes (Signed)
POST OP   Chief Complaint  Patient presents with   Post-op Follow-up    Left knee 03/18/21 improving s/p TKR     Encounter Diagnosis  Name Primary?   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 Yes    PROCEDURE: left tka   POV # 3  Meds related:  Oxycodone for PT  Robaxin  gabapentin   0-120 Healed incision  No assist devices   Return for annual f/u

## 2021-05-01 ENCOUNTER — Encounter (HOSPITAL_COMMUNITY): Payer: Self-pay | Admitting: Physical Therapy

## 2021-05-01 ENCOUNTER — Other Ambulatory Visit: Payer: Self-pay

## 2021-05-01 ENCOUNTER — Ambulatory Visit (HOSPITAL_COMMUNITY): Payer: 59 | Admitting: Physical Therapy

## 2021-05-01 DIAGNOSIS — M6281 Muscle weakness (generalized): Secondary | ICD-10-CM

## 2021-05-01 DIAGNOSIS — M25662 Stiffness of left knee, not elsewhere classified: Secondary | ICD-10-CM

## 2021-05-01 DIAGNOSIS — R29898 Other symptoms and signs involving the musculoskeletal system: Secondary | ICD-10-CM

## 2021-05-01 DIAGNOSIS — R2689 Other abnormalities of gait and mobility: Secondary | ICD-10-CM

## 2021-05-01 DIAGNOSIS — M25562 Pain in left knee: Secondary | ICD-10-CM | POA: Diagnosis not present

## 2021-05-01 NOTE — Therapy (Signed)
[place], [place], [address] Phone: [phone]   Fax:  [phone]  Physical Therapy Treatment  Patient Details  Name: Rachel [name] MRN: [id number] Date of Birth: [birth year] Referring Provider (PT): Arther Abbott MD   Encounter Date: 05/01/2021   PT End of Session - 05/01/21 1311     Visit Number 6    Number of Visits 12    Date for PT Re-Evaluation 05/19/21    Authorization Type [employer] (no auth, 30 visit limit)    Authorization - Visit Number 6    Authorization - Number of Visits 30    Progress Note Due on Visit 10    PT Start Time 0867    PT Stop Time 1352    PT Time Calculation (min) 39 min    Activity Tolerance Patient tolerated treatment well    Behavior During Therapy WFL for tasks assessed/performed             Past Medical History:  Diagnosis Date   Anemia    Anxiety    Arthritis    in both knees   Depression    GERD (gastroesophageal reflux disease)    Hyperlipidemia    Hypertension    Sleep apnea     Past Surgical History:  Procedure Laterality Date   CARPAL TUNNEL RELEASE Right    CATARACT EXTRACTION W/PHACO Right 02/19/2020   Procedure: CATARACT EXTRACTION PHACO AND INTRAOCULAR LENS PLACEMENT ([place]);  Surgeon: Baruch Goldmann, MD;  Location: [facility];  Service: Ophthalmology;  Laterality: Right;  CDE: 4.83   CESAREAN SECTION     cesarian  1990   COLONOSCOPY N/A 08/10/2016   Procedure: COLONOSCOPY;  Surgeon: Danie Binder, MD;  Location: [facility];  Service: Endoscopy;  Laterality: N/A;  10:30 AM   fallopian tube removed N/A    TOTAL KNEE ARTHROPLASTY Left 03/18/2021   Procedure: TOTAL KNEE ARTHROPLASTY;  Surgeon: Carole Civil, MD;  Location: [facility];  Service: Orthopedics;  Laterality: Left;    There were no vitals filed for this visit.   Subjective Assessment - 05/01/21 1313     Subjective She has been working on walking. No pain. has been doing HEP,  still weak in front thigh.    Patient Stated Goals get stronger and get back to work    Currently in Pain? No/denies                               [facility] Adult PT Treatment/Exercise - 05/01/21 0001       Ambulation/Gait   Stairs Yes    Stairs Assistance 6: Modified independent (Device/Increase time)    Stair Management Technique Alternating pattern;One rail Right;One rail Left    Number of Stairs 7    Height of Stairs 7      Knee/Hip Exercises: Aerobic   Recumbent Bike 5 minutes for dynamic warm up and ROM seat 8      Knee/Hip Exercises: Standing   Heel Raises Both;1 set;20 reps    Heel Raises Limitations Toe raises incline slope    Knee Flexion Left;2 sets;15 reps    Lateral Step Up Left;2 sets;10 reps;Hand Hold: 1;Step Height: 6"    Lateral Step Up Limitations 2 sets of 10 eccentric control on 4 inch step    Forward Step Up Both;2 sets;10 reps;Hand Hold: 1;Step Height: 6"    Stairs 7 inch steps alternating  8 RT    Rocker Board 1 minute    Rocker Board Limitations x 3 lateral      Knee/Hip Exercises: Seated   Sit to [employer] 10 reps                     PT Education - 05/01/21 1312     Education Details HEP    Person(s) Educated Patient    Methods Explanation;Demonstration    Comprehension Verbalized understanding;Returned demonstration              PT Short Term Goals - 04/29/21 1322       PT SHORT TERM GOAL #1   Title Patient will be independent with HEP in order to improve functional outcomes.    Baseline 04/29/21:  Reports compliance wiht HEP daily.    Status Achieved      PT SHORT TERM GOAL #2   Title Patient will report at least 25% improvement in symptoms for improved quality of life.    Baseline 04/29/21:  Reports improvements by 60%.    Status Achieved               PT Long Term Goals - 04/29/21 1324       PT LONG TERM GOAL #1   Title Patient will report at least 75% improvement in symptoms for improved quality  of life.    Baseline 04/29/21:  Reports improvements by 60%.      PT LONG TERM GOAL #2   Title Patient will improve FOTO score by at least 30 points in order to indicate improved tolerance to activity.    Baseline 04/29/21:  not complete this session      PT LONG TERM GOAL #3   Title Patient will be able to navigate stairs with reciprocal pattern without compensation in order to demonstrate improved LE strength.      PT LONG TERM GOAL #4   Title Patient will improve ROM for left knee extension/flexion to 0-120 degrees to improve squatting, and other functional mobility.    Baseline 04/29/21: 0-124 degrees    Status Achieved                   Plan - 05/01/21 1311     Clinical Impression Statement Patient ambulating with improving gait mechanics with minimally antalgic gait without AD. Patient demonstrating good ROM but lacking quad strength. Patient tolerates additional strengthening exercises without increase in symptoms but notes fatigue at end of session. Patient will continue to benefit from skilled physical therapy in order to improve function and reduce impairment.    Personal Factors and Comorbidities Comorbidity 3+;Fitness;Past/Current Experience    Comorbidities anxiety, depression, HTN, increased BMI    Examination-Activity Limitations Locomotion Level;Bend;Lift;Sleep;Squat;Stairs;Stand;Transfers    Examination-Participation Restrictions Cleaning;Occupation;Meal Prep;Community Activity;Shop;Volunteer;Valla Leaver [facility]    Stability/Clinical Decision Making Stable/Uncomplicated    Rehab Potential Good    PT Frequency 2x / week    PT Duration 6 weeks    PT Treatment/Interventions ADLs/Self Care Home Management;Aquatic Therapy;Electrical Stimulation;Cryotherapy;Iontophoresis 4mg /ml Dexamethasone;Moist Heat;Traction;Ultrasound;DME Instruction;Gait training;Stair training;Functional mobility training;Therapeutic activities;Therapeutic exercise;Balance training;Neuromuscular  re-education;Patient/family education;Orthotic Fit/Training;Manual techniques;Manual lymph drainage;Compression bandaging;Scar mobilization;Passive range of motion;Dry needling;Energy conservation;Splinting;Taping    PT Next Visit Plan Measure for progression garments. L knee mobility, L knee strength,  Next session add/continue step training, SLS activities; progress as able, f/u with compliance of HEP    PT Home Exercise Plan quad set, HHPT exercises, 10/12: 100 quad sets and SLR 10/25 mini squat, LAQ, hip abd  10/27 STS    Consulted and Agree with Plan of Care Patient             Patient will benefit from skilled therapeutic intervention in order to improve the following deficits and impairments:  Abnormal gait, Decreased range of motion, Difficulty walking, Decreased endurance, Decreased activity tolerance, Pain, Decreased balance, Improper body mechanics, Impaired flexibility, Decreased mobility, Decreased strength, Increased edema  Visit Diagnosis: Left knee pain, unspecified chronicity  Stiffness of left knee, not elsewhere classified  Muscle weakness (generalized)  Other abnormalities of gait and mobility  Other symptoms and signs involving the musculoskeletal system     Problem List Patient Active Problem List   Diagnosis Date Noted   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 03/18/2021   Primary osteoarthritis of left knee    Abnormal thyroid blood test 08/17/2018   Upper airway cough syndrome 10/11/2017   COPD GOLD 0  08/12/2017   Morbid obesity ([place]) 08/12/2017   Acute on chronic respiratory failure with hypoxemia (HCC)    ARDS (adult respiratory distress syndrome) ([place])    Influenza B 10/10/2016   Special screening for malignant neoplasms, colon    HYPERCHOLESTEROLEMIA 08/03/2009   BIPOLAR DISORDER UNSPECIFIED 08/03/2009   OBSESSIVE-COMPULSIVE DISORDER 08/03/2009   Essential hypertension, benign 08/03/2009   KNEE PAIN, BILATERAL 08/03/2009    1:52 PM,  05/01/21 Mearl Latin PT, DPT Physical Therapist at [facility] [address] [place], [place], [address] Phone: [phone]   Fax:  [phone]  Name: Rachel [name] MRN: [id number] Date of Birth: 1960-11-03

## 2021-05-06 ENCOUNTER — Ambulatory Visit (HOSPITAL_COMMUNITY): Payer: 59

## 2021-05-06 ENCOUNTER — Other Ambulatory Visit: Payer: Self-pay

## 2021-05-06 ENCOUNTER — Encounter (HOSPITAL_COMMUNITY): Payer: Self-pay

## 2021-05-06 ENCOUNTER — Other Ambulatory Visit: Payer: Self-pay | Admitting: Orthopedic Surgery

## 2021-05-06 DIAGNOSIS — M25662 Stiffness of left knee, not elsewhere classified: Secondary | ICD-10-CM

## 2021-05-06 DIAGNOSIS — R2689 Other abnormalities of gait and mobility: Secondary | ICD-10-CM

## 2021-05-06 DIAGNOSIS — M25562 Pain in left knee: Secondary | ICD-10-CM

## 2021-05-06 DIAGNOSIS — R29898 Other symptoms and signs involving the musculoskeletal system: Secondary | ICD-10-CM

## 2021-05-06 DIAGNOSIS — M6281 Muscle weakness (generalized): Secondary | ICD-10-CM

## 2021-05-06 NOTE — Telephone Encounter (Signed)
Patient requests refill: methocarbamol (ROBAXIN) 500 MG tablet 60 tablet       [place]

## 2021-05-06 NOTE — Patient Instructions (Signed)
KNEE: Quadriceps - Prone    Place strap around ankle. Bring ankle toward buttocks. Press hip into surface.  Hold 30 seconds. 3 reps per set, 2 sets per day, 7 days per week   Copyright  VHI. All rights reserved.    Calf Stretch    Right leg behind, straight, foot facing forward, left knee bent to 90. Place hands on wall. Keep heel on floor. Hold 30 seconds. Repeat 3 times. Repeat with other leg. Do ___ times per day.  Copyright  VHI. All rights reserved.

## 2021-05-06 NOTE — Therapy (Signed)
[place], [place], [address] Phone: [phone]   Fax:  [phone]  Physical Therapy Treatment  Patient Details  Name: Rachel [name] MRN: [id number] Date of Birth: June 13, 1961 Referring Provider (PT): Arther Abbott MD   Encounter Date: 05/06/2021   PT End of Session - 05/06/21 1150     Visit Number 7    Number of Visits 12    Date for PT Re-Evaluation 05/19/21    Authorization Type [employer] (no auth, 30 visit limit)    Authorization - Visit Number 7    Authorization - Number of Visits 30    Progress Note Due on Visit 10    PT Start Time 7209    PT Stop Time 1225    PT Time Calculation (min) 40 min    Activity Tolerance Patient tolerated treatment well    Behavior During Therapy WFL for tasks assessed/performed             Past Medical History:  Diagnosis Date   Anemia    Anxiety    Arthritis    in both knees   Depression    GERD (gastroesophageal reflux disease)    Hyperlipidemia    Hypertension    Sleep apnea     Past Surgical History:  Procedure Laterality Date   CARPAL TUNNEL RELEASE Right    CATARACT EXTRACTION W/PHACO Right 02/19/2020   Procedure: CATARACT EXTRACTION PHACO AND INTRAOCULAR LENS PLACEMENT ([place]);  Surgeon: Baruch Goldmann, MD;  Location: [facility];  Service: Ophthalmology;  Laterality: Right;  CDE: 4.83   CESAREAN SECTION     cesarian  1990   COLONOSCOPY N/A 08/10/2016   Procedure: COLONOSCOPY;  Surgeon: Danie Binder, MD;  [facility];  Service: Endoscopy;  Laterality: N/A;  10:30 AM   fallopian tube removed N/A    TOTAL KNEE ARTHROPLASTY Left 03/18/2021   Procedure: TOTAL KNEE ARTHROPLASTY;  Surgeon: Carole Civil, MD;  Location: [facility];  Service: Orthopedics;  Laterality: Left;    There were no vitals filed for this visit.   Subjective Assessment - 05/06/21 1150     Subjective Pt stated her knee and thigh are very tight today, feels weak is  quads.  Saw MD and returns in 1 year.  RTW 05/26/21.    Patient Stated Goals get stronger and get back to work    Currently in Pain? No/denies                               [facility] Adult PT Treatment/Exercise - 05/06/21 0001       Knee/Hip Exercises: Stretches   Sports administrator 3 reps;30 seconds    Quad Stretch Limitations prone wiht rope    Gastroc Stretch Left;3 reps;30 seconds    Gastroc Stretch Limitations 2 sets against wall, 1 wiht slant board      Knee/Hip Exercises: Aerobic   Recumbent Bike 5 minutes for dynamic warm up and ROM seat 7      Knee/Hip Exercises: Standing   Heel Raises 20 reps    Lateral Step Up Left;2 sets;10 reps;Hand Hold: 1;Step Height: 6"    Lateral Step Up Limitations Cueing to reduce HHA and eccentric control    Stairs 7 inch steps alternating 8 RT      Knee/Hip Exercises: Seated   Sit to Sand 10 reps;without UE support   eccentric control  Manual Therapy   Manual Therapy Myofascial release    Manual therapy comments Manual complete separate than rest of tx    Myofascial Release Scar tissue massage x8 min                     PT Education - 05/06/21 1301     Education Details Educated on benefits iwth compression garments for edema control.  Scar tissue massage to address keloid scar tissue.  Rubbing different materials on incision to improve tolerance with pants and to improve tolerance for sleeping prone.    Person(s) Educated Patient    Methods Explanation;Demonstration;Handout    Comprehension Verbalized understanding              PT Short Term Goals - 04/29/21 1322       PT SHORT TERM GOAL #1   Title Patient will be independent with HEP in order to improve functional outcomes.    Baseline 04/29/21:  Reports compliance wiht HEP daily.    Status Achieved      PT SHORT TERM GOAL #2   Title Patient will report at least 25% improvement in symptoms for improved quality of life.    Baseline 04/29/21:  Reports  improvements by 60%.    Status Achieved               PT Long Term Goals - 04/29/21 1324       PT LONG TERM GOAL #1   Title Patient will report at least 75% improvement in symptoms for improved quality of life.    Baseline 04/29/21:  Reports improvements by 60%.      PT LONG TERM GOAL #2   Title Patient will improve FOTO score by at least 30 points in order to indicate improved tolerance to activity.    Baseline 04/29/21:  not complete this session      PT LONG TERM GOAL #3   Title Patient will be able to navigate stairs with reciprocal pattern without compensation in order to demonstrate improved LE strength.      PT LONG TERM GOAL #4   Title Patient will improve ROM for left knee extension/flexion to 0-120 degrees to improve squatting, and other functional mobility.    Baseline 04/29/21: 0-124 degrees    Status Achieved                   Plan - 05/06/21 1303     Clinical Impression Statement Added stretches to address c/o tightness.  Educated benefits with compression garments to address edema proximal knee, measurements taken and handout given for [employer]  Noted keloid scar tissue, educated benefits with scar tissue massage and rubbing different materal to improve tolerance wearing pants and laying prone to imporve sleeping at night.  Therex focus on functional strengthening, verbal cueing to reduce UE support wiht stairs training and to improve eccentric control which was difficult due to quad weakness.    Personal Factors and Comorbidities Comorbidity 3+;Fitness;Past/Current Experience    Comorbidities anxiety, depression, HTN, increased BMI    Examination-Activity Limitations Locomotion Level;Bend;Lift;Sleep;Squat;Stairs;Stand;Transfers    Examination-Participation Restrictions Cleaning;Occupation;Meal Prep;Community Activity;Shop;Volunteer;Valla Leaver [facility]    Stability/Clinical Decision Making Stable/Uncomplicated    Rehab Potential Good    PT  Frequency 2x / week    PT Duration 6 weeks    PT Treatment/Interventions ADLs/Self Care Home Management;Aquatic Therapy;Electrical Stimulation;Cryotherapy;Iontophoresis 4mg /ml Dexamethasone;Moist Heat;Traction;Ultrasound;DME Instruction;Gait training;Stair training;Functional mobility training;Therapeutic activities;Therapeutic exercise;Balance training;Neuromuscular re-education;Patient/family education;Orthotic Fit/Training;Manual techniques;Manual lymph drainage;Compression bandaging;Scar mobilization;Passive range  of motion;Dry needling;Energy conservation;Splinting;Taping    PT Next Visit Plan F/U wiht donning compression garments.. L knee mobility, L knee strength,  Next session add/continue step training, SLS activities; progress as able, f/u with compliance of HEP    PT Home Exercise Plan quad set, HHPT exercises, 10/12: 100 quad sets and SLR 10/25 mini squat, LAQ, hip abd 10/27 STS; 11/8: quad and calf stretches, scar tissue massage    Consulted and Agree with Plan of Care Patient             Patient will benefit from skilled therapeutic intervention in order to improve the following deficits and impairments:  Abnormal gait, Decreased range of motion, Difficulty walking, Decreased endurance, Decreased activity tolerance, Pain, Decreased balance, Improper body mechanics, Impaired flexibility, Decreased mobility, Decreased strength, Increased edema  Visit Diagnosis: Left knee pain, unspecified chronicity  Stiffness of left knee, not elsewhere classified  Muscle weakness (generalized)  Other abnormalities of gait and mobility  Other symptoms and signs involving the musculoskeletal system     Problem List Patient Active Problem List   Diagnosis Date Noted   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 03/18/2021   Primary osteoarthritis of left knee    Abnormal thyroid blood test 08/17/2018   Upper airway cough syndrome 10/11/2017   COPD GOLD 0  08/12/2017   Morbid  obesity ([place]) 08/12/2017   Acute on chronic respiratory failure with hypoxemia (HCC)    ARDS (adult respiratory distress syndrome) ([place])    Influenza B 10/10/2016   Special screening for malignant neoplasms, colon    HYPERCHOLESTEROLEMIA 08/03/2009   BIPOLAR DISORDER UNSPECIFIED 08/03/2009   OBSESSIVE-COMPULSIVE DISORDER 08/03/2009   Essential hypertension, benign 08/03/2009   KNEE PAIN, BILATERAL 08/03/2009   Ihor Austin, LPTA/CLT; CBIS [phone]  Aldona Lento, PTA 05/06/2021, 1:10 PM  [place] [address] [place], [place], [address] Phone: [phone]   Fax:  [phone]  Name: LEOLIA VINZANT MRN: [id number] Date of Birth: 04-28-1961

## 2021-05-07 MED ORDER — METHOCARBAMOL 500 MG PO TABS: 500.0000 mg | ORAL_TABLET | Freq: Four times a day (QID) | ORAL | 2 refills | Status: DC | PRN

## 2021-05-08 ENCOUNTER — Ambulatory Visit (HOSPITAL_COMMUNITY): Payer: 59 | Admitting: Physical Therapy

## 2021-05-13 ENCOUNTER — Other Ambulatory Visit: Payer: Self-pay

## 2021-05-13 ENCOUNTER — Ambulatory Visit (HOSPITAL_COMMUNITY): Payer: 59 | Admitting: Physical Therapy

## 2021-05-13 ENCOUNTER — Encounter (HOSPITAL_COMMUNITY): Payer: Self-pay | Admitting: Physical Therapy

## 2021-05-13 DIAGNOSIS — M25562 Pain in left knee: Secondary | ICD-10-CM

## 2021-05-13 DIAGNOSIS — R29898 Other symptoms and signs involving the musculoskeletal system: Secondary | ICD-10-CM

## 2021-05-13 DIAGNOSIS — M6281 Muscle weakness (generalized): Secondary | ICD-10-CM

## 2021-05-13 DIAGNOSIS — M25662 Stiffness of left knee, not elsewhere classified: Secondary | ICD-10-CM

## 2021-05-13 DIAGNOSIS — R2689 Other abnormalities of gait and mobility: Secondary | ICD-10-CM

## 2021-05-13 NOTE — Therapy (Signed)
[place], [place], [address] Phone: [phone]   Fax:  [phone]  Physical Therapy Treatment  Patient Details  Name: Rachel [name] MRN: [id number] Date of Birth: 08-23-1960 Referring Provider (PT): Arther Abbott MD   Encounter Date: 05/13/2021   PT End of Session - 05/13/21 1312     Visit Number 8    Number of Visits 12    Date for PT Re-Evaluation 05/19/21    Authorization Type [employer] (no auth, 30 visit limit)    Authorization - Visit Number 8    Authorization - Number of Visits 30    Progress Note Due on Visit 10    PT Start Time 1315    PT Stop Time 1355    PT Time Calculation (min) 40 min    Activity Tolerance Patient tolerated treatment well    Behavior During Therapy WFL for tasks assessed/performed             Past Medical History:  Diagnosis Date   Anemia    Anxiety    Arthritis    in both knees   Depression    GERD (gastroesophageal reflux disease)    Hyperlipidemia    Hypertension    Sleep apnea     Past Surgical History:  Procedure Laterality Date   CARPAL TUNNEL RELEASE Right    CATARACT EXTRACTION W/PHACO Right 02/19/2020   Procedure: CATARACT EXTRACTION PHACO AND INTRAOCULAR LENS PLACEMENT ([place]);  Surgeon: Baruch Goldmann, MD;  Location: [facility];  Service: Ophthalmology;  Laterality: Right;  CDE: 4.83   CESAREAN SECTION     cesarian  1990   COLONOSCOPY N/A 08/10/2016   Procedure: COLONOSCOPY;  Surgeon: Danie Binder, MD;  Location: [facility];  Service: Endoscopy;  Laterality: N/A;  10:30 AM   fallopian tube removed N/A    TOTAL KNEE ARTHROPLASTY Left 03/18/2021   Procedure: TOTAL KNEE ARTHROPLASTY;  Surgeon: Carole Civil, MD;  Location: [facility];  Service: Orthopedics;  Laterality: Left;    There were no vitals filed for this visit.   Subjective Assessment - 05/13/21 1313     Subjective Patient states her knee is really sore today. She did alot more  yesterday.    Patient Stated Goals get stronger and get back to work    Currently in Pain? No/denies                               [facility] Adult PT Treatment/Exercise - 05/13/21 0001       Knee/Hip Exercises: Aerobic   Recumbent Bike 5 minutes for dynamic warm up and ROM seat 7      Knee/Hip Exercises: Standing   Heel Raises 20 reps    Knee Flexion Left;20 reps    Lateral Step Up Both;2 sets;10 reps;Hand Hold: 1;Step Height: 4"    Lateral Step Up Limitations eccentric control    Forward Step Up Both;2 sets;10 reps;Hand Hold: 1;Step Height: 8"    Stairs 7 inch steps alternating 4 RT      Knee/Hip Exercises: Seated   Long Arc Quad Left;10 reps    Long Arc Quad Weight 3 lbs.    Long [employer] Limitations 5 second holds    Other Seated Knee/Hip Exercises hamstring iso 10 x 5 second holds with green ball  PT Education - 05/13/21 1312     Education Details HEP, compression garments    Person(s) Educated Patient    Methods Explanation;Demonstration    Comprehension Verbalized understanding;Returned demonstration              PT Short Term Goals - 04/29/21 1322       PT SHORT TERM GOAL #1   Title Patient will be independent with HEP in order to improve functional outcomes.    Baseline 04/29/21:  Reports compliance wiht HEP daily.    Status Achieved      PT SHORT TERM GOAL #2   Title Patient will report at least 25% improvement in symptoms for improved quality of life.    Baseline 04/29/21:  Reports improvements by 60%.    Status Achieved               PT Long Term Goals - 04/29/21 1324       PT LONG TERM GOAL #1   Title Patient will report at least 75% improvement in symptoms for improved quality of life.    Baseline 04/29/21:  Reports improvements by 60%.      PT LONG TERM GOAL #2   Title Patient will improve FOTO score by at least 30 points in order to indicate improved tolerance to activity.    Baseline  04/29/21:  not complete this session      PT LONG TERM GOAL #3   Title Patient will be able to navigate stairs with reciprocal pattern without compensation in order to demonstrate improved LE strength.      PT LONG TERM GOAL #4   Title Patient will improve ROM for left knee extension/flexion to 0-120 degrees to improve squatting, and other functional mobility.    Baseline 04/29/21: 0-124 degrees    Status Achieved                   Plan - 05/13/21 1312     Clinical Impression Statement Patient educated on probable soreness from increase in activity recently. Patient able to complete step up with increased height step today but continues to demonstrate impaired concentric and eccentric strength. Attempted lateral step down with eccentric control but patient unable due to impaired strength. Mechanics improve with 4 inch step. Patient notes moderate to high fatigue today but she also states fatigue at beginning of session. Patient will continue to benefit from skilled physical therapy in order to reduce impairment and improve function.    Personal Factors and Comorbidities Comorbidity 3+;Fitness;Past/Current Experience    Comorbidities anxiety, depression, HTN, increased BMI    Examination-Activity Limitations Locomotion Level;Bend;Lift;Sleep;Squat;Stairs;Stand;Transfers    Examination-Participation Restrictions Cleaning;Occupation;Meal Prep;Community Activity;Shop;Volunteer;[facility]    Stability/Clinical Decision Making Stable/Uncomplicated    Rehab Potential Good    PT Frequency 2x / week    PT Duration 6 weeks    PT Treatment/Interventions ADLs/Self Care Home Management;Aquatic Therapy;Electrical Stimulation;Cryotherapy;Iontophoresis 4mg /ml Dexamethasone;Moist Heat;Traction;Ultrasound;DME Instruction;Gait training;Stair training;Functional mobility training;Therapeutic activities;Therapeutic exercise;Balance training;Neuromuscular re-education;Patient/family education;Orthotic  Fit/Training;Manual techniques;Manual lymph drainage;Compression bandaging;Scar mobilization;Passive range of motion;Dry needling;Energy conservation;Splinting;Taping    PT Next Visit Plan F/U wiht donning compression garments.. L knee mobility, L knee strength,  Next session add/continue step training, SLS activities; progress as able, f/u with compliance of HEP    PT Home Exercise Plan quad set, HHPT exercises, 10/12: 100 quad sets and SLR 10/25 mini squat, LAQ, hip abd 10/27 STS; 11/8: quad and calf stretches, scar tissue massage    Consulted and Agree with Plan of Care  Patient             Patient will benefit from skilled therapeutic intervention in order to improve the following deficits and impairments:  Abnormal gait, Decreased range of motion, Difficulty walking, Decreased endurance, Decreased activity tolerance, Pain, Decreased balance, Improper body mechanics, Impaired flexibility, Decreased mobility, Decreased strength, Increased edema  Visit Diagnosis: Left knee pain, unspecified chronicity  Stiffness of left knee, not elsewhere classified  Muscle weakness (generalized)  Other abnormalities of gait and mobility  Other symptoms and signs involving the musculoskeletal system     Problem List Patient Active Problem List   Diagnosis Date Noted   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 03/18/2021   Primary osteoarthritis of left knee    Abnormal thyroid blood test 08/17/2018   Upper airway cough syndrome 10/11/2017   COPD GOLD 0  08/12/2017   Morbid obesity ([place]) 08/12/2017   Acute on chronic respiratory failure with hypoxemia (HCC)    ARDS (adult respiratory distress syndrome) ([place])    Influenza B 10/10/2016   Special screening for malignant neoplasms, colon    HYPERCHOLESTEROLEMIA 08/03/2009   BIPOLAR DISORDER UNSPECIFIED 08/03/2009   OBSESSIVE-COMPULSIVE DISORDER 08/03/2009   Essential hypertension, benign 08/03/2009   KNEE PAIN, BILATERAL 08/03/2009     1:55 PM, 05/13/21 Mearl Latin PT, DPT Physical Therapist at [place] [address] [place], [place], [address] Phone: [phone]   Fax:  [phone]  Name: Rachel [name] MRN: [id number] Date of Birth: 1961-03-23

## 2021-05-15 ENCOUNTER — Other Ambulatory Visit: Payer: Self-pay

## 2021-05-15 ENCOUNTER — Encounter (HOSPITAL_COMMUNITY): Payer: Self-pay | Admitting: Physical Therapy

## 2021-05-15 ENCOUNTER — Ambulatory Visit (HOSPITAL_COMMUNITY): Payer: 59 | Admitting: Physical Therapy

## 2021-05-15 DIAGNOSIS — R29898 Other symptoms and signs involving the musculoskeletal system: Secondary | ICD-10-CM

## 2021-05-15 DIAGNOSIS — M25562 Pain in left knee: Secondary | ICD-10-CM

## 2021-05-15 DIAGNOSIS — M6281 Muscle weakness (generalized): Secondary | ICD-10-CM

## 2021-05-15 DIAGNOSIS — M25662 Stiffness of left knee, not elsewhere classified: Secondary | ICD-10-CM

## 2021-05-15 DIAGNOSIS — R2689 Other abnormalities of gait and mobility: Secondary | ICD-10-CM

## 2021-05-15 NOTE — Therapy (Signed)
[place], [place], [address] Phone: [phone]   Fax:  [phone]  Physical Therapy Treatment  Patient Details  Name: Rachel [name] MRN: [id number] Date of Birth: September 13, 1960 Referring Provider (PT): Arther Abbott MD   Encounter Date: 05/15/2021   PT End of Session - 05/15/21 1324     Visit Number 9    Number of Visits 12    Date for PT Re-Evaluation 05/19/21    Authorization Type [employer] (no auth, 30 visit limit)    Authorization - Visit Number 9    Authorization - Number of Visits 30    Progress Note Due on Visit 10    PT Start Time 8003   arrives late/delayed check in   PT Stop Time 1355    PT Time Calculation (min) 30 min    Activity Tolerance Patient tolerated treatment well    Behavior During Therapy [facility] for tasks assessed/performed             Past Medical History:  Diagnosis Date   Anemia    Anxiety    Arthritis    in both knees   Depression    GERD (gastroesophageal reflux disease)    Hyperlipidemia    Hypertension    Sleep apnea     Past Surgical History:  Procedure Laterality Date   CARPAL TUNNEL RELEASE Right    CATARACT EXTRACTION W/PHACO Right 02/19/2020   Procedure: CATARACT EXTRACTION PHACO AND INTRAOCULAR LENS PLACEMENT ([place]);  Surgeon: Baruch Goldmann, MD;  Location: [facility];  Service: Ophthalmology;  Laterality: Right;  CDE: 4.83   CESAREAN SECTION     cesarian  1990   COLONOSCOPY N/A 08/10/2016   Procedure: COLONOSCOPY;  Surgeon: Danie Binder, MD;  [facility];  Service: Endoscopy;  Laterality: N/A;  10:30 AM   fallopian tube removed N/A    TOTAL KNEE ARTHROPLASTY Left 03/18/2021   Procedure: TOTAL KNEE ARTHROPLASTY;  Surgeon: Carole Civil, MD;  Location: [facility];  Service: Orthopedics;  Laterality: Left;    There were no vitals filed for this visit.   Subjective Assessment - 05/15/21 1326     Subjective Patient states her knee still  buckles sometimes. She lost compression garment paper.    Patient Stated Goals get stronger and get back to work    Currently in Pain? No/denies                               [facility] Adult PT Treatment/Exercise - 05/15/21 0001       Knee/Hip Exercises: Aerobic   Recumbent Bike 5 minutes for dynamic warm up and ROM seat 7      Knee/Hip Exercises: Standing   Knee Flexion Left;20 reps    Lateral Step Up Both;10 reps;Hand Hold: 1;Step Height: 6";3 sets    Lateral Step Up Limitations eccentric control    Forward Step Up Both;2 sets;10 reps;Hand Hold: 1;Step Height: 8"    Stairs 7 inch steps alternating 5 RT                     PT Education - 05/15/21 1326     Education Details HEP, compression garments    Person(s) Educated Patient    Methods Explanation;Demonstration    Comprehension Verbalized understanding;Returned demonstration              PT Short Term Goals - 04/29/21  [place] #1   Title Patient will be independent with HEP in order to improve functional outcomes.    Baseline 04/29/21:  Reports compliance wiht HEP daily.    Status Achieved      PT SHORT TERM GOAL #2   Title Patient will report at least 25% improvement in symptoms for improved quality of life.    Baseline 04/29/21:  Reports improvements by 60%.    Status Achieved               PT Long Term Goals - 04/29/21 1324       PT LONG TERM GOAL #1   Title Patient will report at least 75% improvement in symptoms for improved quality of life.    Baseline 04/29/21:  Reports improvements by 60%.      PT LONG TERM GOAL #2   Title Patient will improve FOTO score by at least 30 points in order to indicate improved tolerance to activity.    Baseline 04/29/21:  not complete this session      PT LONG TERM GOAL #3   Title Patient will be able to navigate stairs with reciprocal pattern without compensation in order to demonstrate improved LE strength.       PT LONG TERM GOAL #4   Title Patient will improve ROM for left knee extension/flexion to 0-120 degrees to improve squatting, and other functional mobility.    Baseline 04/29/21: 0-124 degrees    Status Achieved                   Plan - 05/15/21 1325     Clinical Impression Statement Session limited to patient's late arrival/delayed check in. Patient had lost paper with measurements for compression garments, remeasured and gave handout. Continued mobility and strengthening exercises which are tolerated well. She demonstrates impaired strength and eccentric quad control with stair exercises. Patient will continue to benefit from skilled physical therapy in order to reduce impairment and improve function.    Personal Factors and Comorbidities Comorbidity 3+;Fitness;Past/Current Experience    Comorbidities anxiety, depression, HTN, increased BMI    Examination-Activity Limitations Locomotion Level;Bend;Lift;Sleep;Squat;Stairs;Stand;Transfers    Examination-Participation Restrictions Cleaning;Occupation;Meal Prep;Community Activity;Shop;Volunteer;[facility]aver [facility]    Stability/Clinical Decision Making Stable/Uncomplicated    Rehab Potential Good    PT Frequency 2x / week    PT Duration 6 weeks    PT Treatment/Interventions ADLs/Self Care Home Management;Aquatic Therapy;Electrical Stimulation;Cryotherapy;Iontophoresis 4mg /ml Dexamethasone;Moist Heat;Traction;Ultrasound;DME Instruction;Gait training;Stair training;Functional mobility training;Therapeutic activities;Therapeutic exercise;Balance training;Neuromuscular re-education;Patient/family education;Orthotic Fit/Training;Manual techniques;Manual lymph drainage;Compression bandaging;Scar mobilization;Passive range of motion;Dry needling;Energy conservation;Splinting;Taping    PT Next Visit Plan F/U wiht donning compression garments.. L knee mobility, L knee strength,  Next session add/continue step training, SLS activities; progress as able,  f/u with compliance of HEP    PT Home Exercise Plan quad set, HHPT exercises, 10/12: 100 quad sets and SLR 10/25 mini squat, LAQ, hip abd 10/27 STS; 11/8: quad and calf stretches, scar tissue massage    Consulted and Agree with Plan of Care Patient             Patient will benefit from skilled therapeutic intervention in order to improve the following deficits and impairments:  Abnormal gait, Decreased range of motion, Difficulty walking, Decreased endurance, Decreased activity tolerance, Pain, Decreased balance, Improper body mechanics, Impaired flexibility, Decreased mobility, Decreased strength, Increased edema  Visit Diagnosis: Left knee pain, unspecified chronicity  Stiffness of left knee, not elsewhere classified  Muscle weakness (  generalized)  Other abnormalities of gait and mobility  Other symptoms and signs involving the musculoskeletal system     Problem List Patient Active Problem List   Diagnosis Date Noted   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 03/18/2021   Primary osteoarthritis of left knee    Abnormal thyroid blood test 08/17/2018   Upper airway cough syndrome 10/11/2017   COPD GOLD 0  08/12/2017   Morbid obesity ([place]) 08/12/2017   Acute on chronic respiratory failure with hypoxemia (HCC)    ARDS (adult respiratory distress syndrome) ([place])    Influenza B 10/10/2016   Special screening for malignant neoplasms, colon    HYPERCHOLESTEROLEMIA 08/03/2009   BIPOLAR DISORDER UNSPECIFIED 08/03/2009   OBSESSIVE-COMPULSIVE DISORDER 08/03/2009   Essential hypertension, benign 08/03/2009   KNEE PAIN, BILATERAL 08/03/2009    1:58 PM, 05/15/21 Mearl Latin PT, DPT Physical Therapist at [place], [place], [address] Phone: [phone]   Fax:  [phone]  Name: Rachel [name] MRN: [id number] Date of Birth: 05-19-1961

## 2021-05-19 ENCOUNTER — Ambulatory Visit (HOSPITAL_COMMUNITY): Payer: 59 | Admitting: Physical Therapy

## 2021-05-21 ENCOUNTER — Other Ambulatory Visit: Payer: Self-pay

## 2021-05-21 ENCOUNTER — Ambulatory Visit (HOSPITAL_COMMUNITY): Payer: 59 | Admitting: Physical Therapy

## 2021-05-21 ENCOUNTER — Encounter (HOSPITAL_COMMUNITY): Payer: Self-pay | Admitting: Physical Therapy

## 2021-05-21 DIAGNOSIS — M25662 Stiffness of left knee, not elsewhere classified: Secondary | ICD-10-CM

## 2021-05-21 DIAGNOSIS — R2689 Other abnormalities of gait and mobility: Secondary | ICD-10-CM

## 2021-05-21 DIAGNOSIS — M25562 Pain in left knee: Secondary | ICD-10-CM | POA: Diagnosis not present

## 2021-05-21 DIAGNOSIS — R29898 Other symptoms and signs involving the musculoskeletal system: Secondary | ICD-10-CM

## 2021-05-21 DIAGNOSIS — M6281 Muscle weakness (generalized): Secondary | ICD-10-CM

## 2021-05-21 NOTE — Patient Instructions (Signed)
Access Code: [email or web address] URL: [email or web address] Date: 05/21/2021 Prepared by: Josue Hector  Exercises Mini Squat with Counter Support - 2 x daily - 5-7 x weekly - 2 sets - 10 reps Standing Hip Abduction with Counter Support - 2 x daily - 5-7 x weekly - 2 sets - 10 reps Heel Raises with Counter Support - 2 x daily - 5-7 x weekly - 2 sets - 10 reps Standing Tandem Balance with Counter Support - 2 x daily - 5-7 x weekly - 1 sets - 3 reps - 30 second hold Forward Step Up with Counter Support - 2 x daily - 5-7 x weekly - 2 sets - 10 reps Gastroc Stretch on Wall - 2 x daily - 5-7 x weekly - 1 sets - 3 reps - 30 second hold

## 2021-05-21 NOTE — Therapy (Signed)
[place], [place], [address] Phone: [phone]   Fax:  [phone]  Physical Therapy Treatment  Patient Details  Name: Rachel [name] MRN: [id number] Date of Birth: 1960-07-26 Referring Provider (PT): Arther Abbott MD  PHYSICAL THERAPY DISCHARGE SUMMARY  Visits from Start of Care: 10  Current functional level related to goals / functional outcomes: See below    Remaining deficits: See below    Education / Equipment: See assessment    Patient agrees to discharge. Patient goals were partially met. Patient is being discharged due to being pleased with the current functional level.   Encounter Date: 05/21/2021   PT End of Session - 05/21/21 0830     Visit Number 10    Number of Visits 12    Date for PT Re-Evaluation 05/21/21    Authorization Type [employer] (no auth, 30 visit limit)    Authorization - Visit Number 10    Authorization - Number of Visits 30    Progress Note Due on Visit 10    PT Start Time 0821    PT Stop Time 0855    PT Time Calculation (min) 34 min    Activity Tolerance Patient tolerated treatment well    Behavior During Therapy WFL for tasks assessed/performed             Past Medical History:  Diagnosis Date   Anemia    Anxiety    Arthritis    in both knees   Depression    GERD (gastroesophageal reflux disease)    Hyperlipidemia    Hypertension    Sleep apnea     Past Surgical History:  Procedure Laterality Date   CARPAL TUNNEL RELEASE Right    CATARACT EXTRACTION W/PHACO Right 02/19/2020   Procedure: CATARACT EXTRACTION PHACO AND INTRAOCULAR LENS PLACEMENT ([place]);  Surgeon: Baruch Goldmann, MD;  Location: [facility];  Service: Ophthalmology;  Laterality: Right;  CDE: 4.83   CESAREAN SECTION     cesarian  1990   COLONOSCOPY N/A 08/10/2016   Procedure: COLONOSCOPY;  Surgeon: Danie Binder, MD;  Location: [facility];  Service: Endoscopy;  Laterality: N/A;   10:30 AM   fallopian tube removed N/A    TOTAL KNEE ARTHROPLASTY Left 03/18/2021   Procedure: TOTAL KNEE ARTHROPLASTY;  Surgeon: Carole Civil, MD;  Location: [facility];  Service: Orthopedics;  Laterality: Left;    There were no vitals filed for this visit.   Subjective Assessment - 05/21/21 0827     Subjective Patient says her knee is not awake yet. She goes back to work next week. She says she doesn't think she is ready. Patient reports 60% improvement since starting therapy.    Pertinent History TKA 03/18/21    Limitations Standing;Walking;Lifting;House hold activities    Patient Stated Goals get stronger and get back to work    Currently in Pain? Yes    Pain Score 4     Pain Location Knee    Pain Orientation Left    Pain Descriptors / Indicators Tightness    Pain Type Surgical pain    Pain Onset More than a month ago    Pain Frequency Constant    Aggravating Factors  Mornings, prolonged position    Pain Relieving Factors Massage, ice    Effect of Pain on Daily Activities Limits                [facility] PT Assessment - 05/21/21 0001  Assessment   Medical Diagnosis s/p L TKA    Referring Provider (PT) Arther Abbott MD    Onset Date/Surgical Date 03/18/21    Prior Therapy [facility]      Precautions   Precautions None      Restrictions   Weight Bearing Restrictions No      Balance Screen   Has the patient fallen in the past 6 months No      [place]      Prior Function   Level of Independence Independent    Vocation Full time employment      Cognition   Overall Cognitive Status Within Functional Limits for tasks assessed      Observation/Other Assessments   Focus on Therapeutic Outcomes (FOTO)  64% function      AROM   Left Knee Extension 0    Left Knee Flexion 125      Strength   Right Hip Flexion 4+/5    Right Hip Extension 4+/5    Right Hip ABduction 4+/5    Left Hip Flexion 4+/5   was 4   Left  Hip Extension 4/5    Left Hip ABduction 4+/5    Right Knee Extension 5/5    Left Knee Extension 4+/5   was 4-     Ambulation/Gait   Ambulation/Gait Yes    Ambulation/Gait Assistance 7: Independent    Assistive device None    Gait Pattern Trendelenburg    Ambulation Surface Level;Indoor    Stairs Yes    Stairs Assistance 6: Modified independent (Device/Increase time)    Stair Management Technique Alternating pattern;One rail Left    Number of Stairs 8    Height of Stairs 7      Balance   Balance Assessed Yes      Static Standing Balance   Static Standing Balance -  Activities  Tandam Stance - Right Leg;Tandam Stance - Left Leg    Static Standing - Comment/# of Minutes 25 sec bilateral                                      PT Short Term Goals - 04/29/21 1322       PT SHORT TERM GOAL #1   Title Patient will be independent with HEP in order to improve functional outcomes.    Baseline 04/29/21:  Reports compliance wiht HEP daily.    Status Achieved      PT SHORT TERM GOAL #2   Title Patient will report at least 25% improvement in symptoms for improved quality of life.    Baseline 04/29/21:  Reports improvements by 60%.    Status Achieved               PT Long Term Goals - 05/21/21 0841       PT LONG TERM GOAL #1   Title Patient will report at least 75% improvement in symptoms for improved quality of life.    Baseline Reports 60%    Status Not Met      PT LONG TERM GOAL #2   Title Patient will improve FOTO score by at least 30 points in order to indicate improved tolerance to activity.    Baseline 64% function    Status Achieved      PT LONG TERM GOAL #3   Title Patient will be able to navigate stairs with  reciprocal pattern without compensation in order to demonstrate improved LE strength.    Status Achieved      PT LONG TERM GOAL #4   Title Patient will improve ROM for left knee extension/flexion to 0-120 degrees to improve  squatting, and other functional mobility.    Baseline Current 0-125    Status Achieved                   Plan - 05/21/21 0853     Clinical Impression Statement Patient has made good progress to therapy goals. Objective measures are good, though patient has decreased perceived ability. MMTs and ROM are WFL, still has slight gait deviation likely related to glute Medius weakness. She is challenged with single leg support but has good stability in tandem. She is walking with no AD with no reports of LOB. Patient also noting she has not been compliant with HEP. Discussed findings of today's assessment and importance on compliance with exercise program for improving remaining deficits to maximize functional ability. Reviewed comprehensive home program and answered all patient questions. Issued updated HEP handout. Encouraged patient to follow up with therapy services with any further questions or concerns.    Personal Factors and Comorbidities Comorbidity 3+;Fitness;Past/Current Experience    Comorbidities anxiety, depression, HTN, increased BMI    Examination-Activity Limitations Locomotion Level;Bend;Lift;Sleep;Squat;Stairs;Stand;Transfers    Examination-Participation Restrictions Cleaning;Occupation;Meal Prep;Community Activity;Shop;Volunteer;[facility] Leaver [facility]    Stability/Clinical Decision Making Stable/Uncomplicated    Rehab Potential Good    PT Treatment/Interventions ADLs/Self Care Home Management;Aquatic Therapy;Electrical Stimulation;Cryotherapy;Iontophoresis 72m/ml Dexamethasone;Moist Heat;Traction;Ultrasound;DME Instruction;Gait training;Stair training;Functional mobility training;Therapeutic activities;Therapeutic exercise;Balance training;Neuromuscular re-education;Patient/family education;Orthotic Fit/Training;Manual techniques;Manual lymph drainage;Compression bandaging;Scar mobilization;Passive range of motion;Dry needling;Energy conservation;Splinting;Taping    PT Next Visit Plan  DC to HEP    PT Home Exercise Plan quad set, HHPT exercises, 10/12: 100 quad sets and SLR 10/25 mini squat, LAQ, hip abd 10/27 STS; 11/8: quad and calf stretches, scar tissue massage    Consulted and Agree with Plan of Care Patient             Patient will benefit from skilled therapeutic intervention in order to improve the following deficits and impairments:  Abnormal gait, Decreased range of motion, Difficulty walking, Decreased endurance, Decreased activity tolerance, Pain, Decreased balance, Improper body mechanics, Impaired flexibility, Decreased mobility, Decreased strength, Increased edema  Visit Diagnosis: Left knee pain, unspecified chronicity  Stiffness of left knee, not elsewhere classified  Muscle weakness (generalized)  Other abnormalities of gait and mobility  Other symptoms and signs involving the musculoskeletal system     Problem List Patient Active Problem List   Diagnosis Date Noted   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 03/18/2021   Primary osteoarthritis of left knee    Abnormal thyroid blood test 08/17/2018   Upper airway cough syndrome 10/11/2017   COPD GOLD 0  08/12/2017   Morbid obesity ([facility] 08/12/2017   Acute on chronic respiratory failure with hypoxemia (HCC)    ARDS (adult respiratory distress syndrome) ([facility]    Influenza B 10/10/2016   Special screening for malignant neoplasms, colon    HYPERCHOLESTEROLEMIA 08/03/2009   BIPOLAR DISORDER UNSPECIFIED 08/03/2009   OBSESSIVE-COMPULSIVE DISORDER 08/03/2009   Essential hypertension, benign 08/03/2009   KNEE PAIN, BILATERAL 08/03/2009   9:01 AM, 05/21/21 CJosue HectorPT DPT  [facility] [phone] [address] [address]Phone: [phone]  Fax:  [phone]  Name: Rachel [name] MRN: [id number] Date of Birth: 1961/01/17

## 2021-05-26 ENCOUNTER — Ambulatory Visit (INDEPENDENT_AMBULATORY_CARE_PROVIDER_SITE_OTHER): Payer: 59 | Admitting: Orthopedic Surgery

## 2021-05-26 ENCOUNTER — Encounter: Payer: Self-pay | Admitting: Orthopedic Surgery

## 2021-05-26 ENCOUNTER — Other Ambulatory Visit: Payer: Self-pay

## 2021-05-26 DIAGNOSIS — Z96652 Presence of left artificial knee joint: Secondary | ICD-10-CM

## 2021-05-26 DIAGNOSIS — M6281 Muscle weakness (generalized): Secondary | ICD-10-CM

## 2021-05-26 NOTE — Progress Notes (Signed)
Chief Complaint  Patient presents with   Post-op Follow-up    03/18/21 left knee replacement/ knee buckles sometimes     Rachel [name] is 10 weeks out from her left total knee.  She says at times she is walking and the knee will give.  She denies any back pain or posterior thigh pain.  No fever reported  Swelling at times after heavy activity  Her exam shows a well-healed incision she has full extension her flexion is well past 90 degrees she is stable in 90 degrees of flexion and in extension her manual muscle test shows mild quad weakness on terminal extension against resistance her back is nontender  Recommend knee sleeve, quadriceps exercises continue Tylenol arthritis  I discouraged her from taking oxycodone for pain  Follow-up in 6 weeks

## 2021-06-14 ENCOUNTER — Other Ambulatory Visit: Payer: Self-pay | Admitting: Orthopedic Surgery

## 2021-07-01 ENCOUNTER — Other Ambulatory Visit: Payer: Self-pay | Admitting: Orthopedic Surgery

## 2021-07-07 ENCOUNTER — Ambulatory Visit: Payer: 59 | Admitting: Orthopedic Surgery

## 2021-07-08 ENCOUNTER — Encounter: Payer: Self-pay | Admitting: Orthopedic Surgery

## 2021-08-07 ENCOUNTER — Encounter: Payer: Self-pay | Admitting: *Deleted

## 2021-10-13 ENCOUNTER — Other Ambulatory Visit (HOSPITAL_COMMUNITY): Payer: Self-pay | Admitting: Internal Medicine

## 2021-10-13 ENCOUNTER — Ambulatory Visit (HOSPITAL_COMMUNITY)
Admission: RE | Admit: 2021-10-13 | Discharge: 2021-10-13 | Disposition: A | Discharge status: Home / Self Care | Payer: 59 | Source: Ambulatory Visit | Attending: Internal Medicine | Admitting: Internal Medicine

## 2021-10-13 DIAGNOSIS — R059 Cough, unspecified: Secondary | ICD-10-CM

## 2021-10-20 ENCOUNTER — Telehealth: Payer: Self-pay

## 2021-10-20 NOTE — Telephone Encounter (Signed)
NOTES SCANNED TO REFERRAL 

## 2022-01-16 DIAGNOSIS — J302 Other seasonal allergic rhinitis: Secondary | ICD-10-CM | POA: Diagnosis not present

## 2022-01-16 DIAGNOSIS — Z6841 Body Mass Index (BMI) 40.0 and over, adult: Secondary | ICD-10-CM | POA: Diagnosis not present

## 2022-02-13 ENCOUNTER — Encounter (HOSPITAL_BASED_OUTPATIENT_CLINIC_OR_DEPARTMENT_OTHER): Payer: Self-pay

## 2022-02-13 ENCOUNTER — Emergency Department (HOSPITAL_BASED_OUTPATIENT_CLINIC_OR_DEPARTMENT_OTHER): Payer: 59

## 2022-02-13 ENCOUNTER — Encounter (HOSPITAL_COMMUNITY): Payer: Self-pay

## 2022-02-13 ENCOUNTER — Inpatient Hospital Stay (HOSPITAL_BASED_OUTPATIENT_CLINIC_OR_DEPARTMENT_OTHER)
Admission: EM | Admit: 2022-02-13 | Discharge: 2022-02-19 | DRG: 871 | Disposition: A | Discharge status: Home / Self Care | Payer: 59 | Attending: Internal Medicine | Admitting: Internal Medicine

## 2022-02-13 ENCOUNTER — Other Ambulatory Visit: Payer: Self-pay

## 2022-02-13 DIAGNOSIS — N179 Acute kidney failure, unspecified: Secondary | ICD-10-CM | POA: Diagnosis present

## 2022-02-13 DIAGNOSIS — F419 Anxiety disorder, unspecified: Secondary | ICD-10-CM | POA: Diagnosis present

## 2022-02-13 DIAGNOSIS — J440 Chronic obstructive pulmonary disease with acute lower respiratory infection: Secondary | ICD-10-CM | POA: Diagnosis present

## 2022-02-13 DIAGNOSIS — Z79899 Other long term (current) drug therapy: Secondary | ICD-10-CM

## 2022-02-13 DIAGNOSIS — Z91199 Patient's noncompliance with other medical treatment and regimen due to unspecified reason: Secondary | ICD-10-CM

## 2022-02-13 DIAGNOSIS — R778 Other specified abnormalities of plasma proteins: Secondary | ICD-10-CM | POA: Diagnosis present

## 2022-02-13 DIAGNOSIS — Z9079 Acquired absence of other genital organ(s): Secondary | ICD-10-CM

## 2022-02-13 DIAGNOSIS — Z91013 Allergy to seafood: Secondary | ICD-10-CM

## 2022-02-13 DIAGNOSIS — Z96652 Presence of left artificial knee joint: Secondary | ICD-10-CM | POA: Diagnosis present

## 2022-02-13 DIAGNOSIS — F32A Depression, unspecified: Secondary | ICD-10-CM | POA: Diagnosis present

## 2022-02-13 DIAGNOSIS — E871 Hypo-osmolality and hyponatremia: Secondary | ICD-10-CM | POA: Diagnosis present

## 2022-02-13 DIAGNOSIS — E669 Obesity, unspecified: Secondary | ICD-10-CM | POA: Diagnosis present

## 2022-02-13 DIAGNOSIS — K219 Gastro-esophageal reflux disease without esophagitis: Secondary | ICD-10-CM | POA: Diagnosis present

## 2022-02-13 DIAGNOSIS — A419 Sepsis, unspecified organism: Principal | ICD-10-CM

## 2022-02-13 DIAGNOSIS — Z880 Allergy status to penicillin: Secondary | ICD-10-CM

## 2022-02-13 DIAGNOSIS — J9601 Acute respiratory failure with hypoxia: Secondary | ICD-10-CM | POA: Diagnosis present

## 2022-02-13 DIAGNOSIS — Z20822 Contact with and (suspected) exposure to covid-19: Secondary | ICD-10-CM | POA: Diagnosis present

## 2022-02-13 DIAGNOSIS — Z6839 Body mass index (BMI) 39.0-39.9, adult: Secondary | ICD-10-CM

## 2022-02-13 DIAGNOSIS — Z8249 Family history of ischemic heart disease and other diseases of the circulatory system: Secondary | ICD-10-CM

## 2022-02-13 DIAGNOSIS — I110 Hypertensive heart disease with heart failure: Secondary | ICD-10-CM | POA: Diagnosis present

## 2022-02-13 DIAGNOSIS — I4891 Unspecified atrial fibrillation: Secondary | ICD-10-CM | POA: Diagnosis not present

## 2022-02-13 DIAGNOSIS — J189 Pneumonia, unspecified organism: Secondary | ICD-10-CM | POA: Diagnosis not present

## 2022-02-13 DIAGNOSIS — R931 Abnormal findings on diagnostic imaging of heart and coronary circulation: Secondary | ICD-10-CM

## 2022-02-13 DIAGNOSIS — G4733 Obstructive sleep apnea (adult) (pediatric): Secondary | ICD-10-CM | POA: Diagnosis present

## 2022-02-13 DIAGNOSIS — I10 Essential (primary) hypertension: Secondary | ICD-10-CM | POA: Diagnosis present

## 2022-02-13 DIAGNOSIS — E0590 Thyrotoxicosis, unspecified without thyrotoxic crisis or storm: Secondary | ICD-10-CM | POA: Diagnosis present

## 2022-02-13 DIAGNOSIS — I700 Atherosclerosis of aorta: Secondary | ICD-10-CM | POA: Diagnosis not present

## 2022-02-13 DIAGNOSIS — I447 Left bundle-branch block, unspecified: Secondary | ICD-10-CM | POA: Diagnosis present

## 2022-02-13 DIAGNOSIS — Z87891 Personal history of nicotine dependence: Secondary | ICD-10-CM

## 2022-02-13 DIAGNOSIS — Z961 Presence of intraocular lens: Secondary | ICD-10-CM | POA: Diagnosis present

## 2022-02-13 DIAGNOSIS — R7989 Other specified abnormal findings of blood chemistry: Secondary | ICD-10-CM | POA: Diagnosis present

## 2022-02-13 DIAGNOSIS — R059 Cough, unspecified: Secondary | ICD-10-CM | POA: Diagnosis not present

## 2022-02-13 DIAGNOSIS — Z885 Allergy status to narcotic agent status: Secondary | ICD-10-CM

## 2022-02-13 DIAGNOSIS — I071 Rheumatic tricuspid insufficiency: Secondary | ICD-10-CM | POA: Diagnosis present

## 2022-02-13 DIAGNOSIS — E039 Hypothyroidism, unspecified: Secondary | ICD-10-CM | POA: Diagnosis present

## 2022-02-13 DIAGNOSIS — I428 Other cardiomyopathies: Secondary | ICD-10-CM | POA: Diagnosis present

## 2022-02-13 DIAGNOSIS — I5032 Chronic diastolic (congestive) heart failure: Secondary | ICD-10-CM | POA: Diagnosis present

## 2022-02-13 DIAGNOSIS — E7800 Pure hypercholesterolemia, unspecified: Secondary | ICD-10-CM | POA: Diagnosis present

## 2022-02-13 DIAGNOSIS — I5021 Acute systolic (congestive) heart failure: Secondary | ICD-10-CM | POA: Diagnosis present

## 2022-02-13 DIAGNOSIS — I2510 Atherosclerotic heart disease of native coronary artery without angina pectoris: Secondary | ICD-10-CM | POA: Diagnosis present

## 2022-02-13 DIAGNOSIS — R6520 Severe sepsis without septic shock: Secondary | ICD-10-CM | POA: Diagnosis present

## 2022-02-13 DIAGNOSIS — R9431 Abnormal electrocardiogram [ECG] [EKG]: Secondary | ICD-10-CM | POA: Diagnosis present

## 2022-02-13 DIAGNOSIS — Z888 Allergy status to other drugs, medicaments and biological substances status: Secondary | ICD-10-CM

## 2022-02-13 DIAGNOSIS — J9621 Acute and chronic respiratory failure with hypoxia: Secondary | ICD-10-CM | POA: Diagnosis present

## 2022-02-13 LAB — COMPREHENSIVE METABOLIC PANEL
ALT: 13 U/L (ref 0–44)
AST: 20 U/L (ref 15–41)
Albumin: 4.1 g/dL (ref 3.5–5.0)
Alkaline Phosphatase: 91 U/L (ref 38–126)
Anion gap: 13 (ref 5–15)
BUN: 22 mg/dL — ABNORMAL HIGH (ref 6–20)
CO2: 24 mmol/L (ref 22–32)
Calcium: 9.5 mg/dL (ref 8.9–10.3)
Chloride: 93 mmol/L — ABNORMAL LOW (ref 98–111)
Creatinine, Ser: 1.24 mg/dL — ABNORMAL HIGH (ref 0.44–1.00)
GFR, Estimated: 50 mL/min — ABNORMAL LOW (ref >60)
Glucose, Bld: 118 mg/dL — ABNORMAL HIGH (ref 70–99)
Potassium: 3.9 mmol/L (ref 3.5–5.1)
Sodium: 130 mmol/L — ABNORMAL LOW (ref 135–145)
Total Bilirubin: 1.2 mg/dL (ref 0.3–1.2)
Total Protein: 8.1 g/dL (ref 6.5–8.1)

## 2022-02-13 LAB — CBC WITH DIFFERENTIAL/PLATELET
Abs Immature Granulocytes: 0.06 10*3/uL (ref 0.00–0.07)
Basophils Absolute: 0.1 10*3/uL (ref 0.0–0.1)
Basophils Relative: 0 %
Eosinophils Absolute: 0 10*3/uL (ref 0.0–0.5)
Eosinophils Relative: 0 %
HCT: 37 % (ref 36.0–46.0)
Hemoglobin: 12.1 g/dL (ref 12.0–15.0)
Immature Granulocytes: 0 %
Lymphocytes Relative: 9 %
Lymphs Abs: 1.2 10*3/uL (ref 0.7–4.0)
MCH: 27.6 pg (ref 26.0–34.0)
MCHC: 32.7 g/dL (ref 30.0–36.0)
MCV: 84.5 fL (ref 80.0–100.0)
Monocytes Absolute: 1.1 10*3/uL — ABNORMAL HIGH (ref 0.1–1.0)
Monocytes Relative: 8 %
Neutro Abs: 11.4 10*3/uL — ABNORMAL HIGH (ref 1.7–7.7)
Neutrophils Relative %: 83 %
Platelets: 391 10*3/uL (ref 150–400)
RBC: 4.38 MIL/uL (ref 3.87–5.11)
RDW: 14.8 % (ref 11.5–15.5)
WBC: 13.9 10*3/uL — ABNORMAL HIGH (ref 4.0–10.5)
nRBC: 0 % (ref 0.0–0.2)

## 2022-02-13 LAB — TROPONIN I (HIGH SENSITIVITY): Troponin I (High Sensitivity): 28 ng/L — ABNORMAL HIGH (ref <18)

## 2022-02-13 LAB — SARS CORONAVIRUS 2 BY RT PCR: SARS Coronavirus 2 by RT PCR: NEGATIVE

## 2022-02-13 LAB — LACTIC ACID, PLASMA: Lactic Acid, Venous: 1.3 mmol/L (ref 0.5–1.9)

## 2022-02-13 MED ORDER — LACTATED RINGERS IV BOLUS (SEPSIS)
1000.0000 mL | Freq: Once | INTRAVENOUS | Status: AC
  Administered 2022-02-13: 1000 mL via INTRAVENOUS

## 2022-02-13 MED ORDER — SODIUM CHLORIDE 0.9 % IV SOLN
2.0000 g | Freq: Two times a day (BID) | INTRAVENOUS | Status: DC
  Administered 2022-02-13 – 2022-02-15 (×5): 2 g via INTRAVENOUS
  Filled 2022-02-13 (×5): qty 12.5

## 2022-02-13 MED ORDER — LACTATED RINGERS IV SOLN: INTRAVENOUS | Status: DC

## 2022-02-13 MED ORDER — ACETAMINOPHEN 325 MG PO TABS
650.0000 mg | ORAL_TABLET | Freq: Once | ORAL | Status: AC | PRN
  Administered 2022-02-13: 650 mg via ORAL

## 2022-02-13 MED ORDER — VANCOMYCIN HCL 750 MG/150ML IV SOLN
750.0000 mg | INTRAVENOUS | Status: DC
  Filled 2022-02-13: qty 150

## 2022-02-13 MED ORDER — SODIUM CHLORIDE 0.9 % IV BOLUS
1000.0000 mL | Freq: Once | INTRAVENOUS | Status: AC
  Administered 2022-02-13: 1000 mL via INTRAVENOUS

## 2022-02-13 MED ORDER — LACTATED RINGERS IV BOLUS (SEPSIS): 1000.0000 mL | Freq: Once | INTRAVENOUS | Status: DC

## 2022-02-13 MED ORDER — VANCOMYCIN HCL IN DEXTROSE 1-5 GM/200ML-% IV SOLN: 1000.0000 mg | Freq: Once | INTRAVENOUS | Status: DC

## 2022-02-13 MED ORDER — SODIUM CHLORIDE 0.9 % IV SOLN: 2.0000 g | Freq: Once | INTRAVENOUS | Status: DC

## 2022-02-13 MED ORDER — METRONIDAZOLE 500 MG/100ML IV SOLN
500.0000 mg | Freq: Once | INTRAVENOUS | Status: AC
  Administered 2022-02-13: 500 mg via INTRAVENOUS
  Filled 2022-02-13: qty 100

## 2022-02-13 MED ORDER — VANCOMYCIN HCL IN DEXTROSE 1-5 GM/200ML-% IV SOLN
1000.0000 mg | Freq: Once | INTRAVENOUS | Status: AC
  Administered 2022-02-14: 1000 mg via INTRAVENOUS
  Filled 2022-02-13: qty 200

## 2022-02-13 MED ORDER — VANCOMYCIN HCL 500 MG IV SOLR
500.0000 mg | Freq: Once | INTRAVENOUS | Status: AC
  Administered 2022-02-14: 500 mg via INTRAVENOUS

## 2022-02-13 NOTE — Progress Notes (Signed)
Pharmacy Antibiotic Note  Rachel [name] is a 61 y.o. female admitted on 02/13/2022 presenting with cough, fever, concern for sepsis.  Pharmacy has been consulted for vancomycin and aztreonam dosing.  Pt has tolerated cephalosporins in past and will proceed with cefepime  Plan: Vancomycin 1500 mg IV x 1, then 750 mg IV q24h (eAUC 493, SCr 1.24) Cefepime 2g IV every 12 hours Add MRSA PCR Monitor renal function, Cx and clinical progression to narrow Vancomycin levels as indicated  Height: 5' (152.4 cm) Weight: 90.7 kg (199 lb 15.3 oz) IBW/kg (Calculated) : 45.5  Temp (24hrs), Avg:100.9 F (38.3 C), Min:100.3 F (37.9 C), Max:101.5 F (38.6 C)  Recent Labs  Lab 02/13/22 2146  WBC 13.9*  CREATININE 1.24*  LATICACIDVEN 1.3    Estimated Creatinine Clearance: 49 mL/min (A) (by C-G formula based on SCr of 1.24 mg/dL (H)).    Allergies  Allergen Reactions   Hydrocodone Nausea And Vomiting   Oxycodone Nausea And Vomiting    Codeine, hydrocodone, most pain pills.   Penicillins Itching, Nausea And Vomiting and Swelling    Has patient had a PCN reaction causing immediate rash, facial/tongue/throat swelling, SOB or lightheadedness with hypotension: Yes, around the eyes Has patient had a PCN reaction causing severe rash involving mucus membranes or skin necrosis: No Has patient had a PCN reaction that required hospitalization: No Has patient had a PCN reaction occurring within the last 10 years: No  If all of the above answers are "NO", then may proceed with Cephalosporin use.    Precedex [Dexmedetomidine Hcl In Nacl]     Increased agitation/confusion in ICU [April 2018]   Seroquel [Quetiapine]     More agitated/confused per daughter [name] when given in the past.   Shellfish Allergy Itching    Bertis Ruddy, PharmD Clinical Pharmacist ED Pharmacist Phone # [phone] 02/13/2022 10:43 PM

## 2022-02-13 NOTE — ED Notes (Signed)
Late entry ---  Pt ambulatory from waiting room independently with steady gait escorted by charge nurse.  Pt changed into hospital gown and placed on continuous cardiac and pulse ox monitoring.   Attending, Dr. Sherry Ruffing, sent secure chat of patient location and status (low grade temp/uncontrolled A-Fib/labored breathing with dry cough) - pt awaits provider eval.  Pt oriented to environment -- this nurse will continue to monitor for acute changes and maintain plan of care.

## 2022-02-13 NOTE — ED Triage Notes (Signed)
Patient here POV from Home.  Endorses Cough that began today. Mildly Productive at Times. Some Congestion. No Sore Throat.   Febrile in Triage.   NAD Noted during Triage. A&Ox4. GCS 15. Ambulatory.

## 2022-02-13 NOTE — ED Notes (Signed)
Pt has hx of COPD GOLD and sleep apnea per Pulmonology note from MD [facility]. Pt denies taking any prescribed COPD medications. Last seen by pulmonology on 10/11/2017 at this time recommended pt continue pulmonary medications. Pt precribed DUO TID, Alb MDI 2 puffs q6h, and symbicort 2 puffs daily. Pt states she has not taken these meds in a long time unable to explain to RT why she stopped. Pt poor historian. Pt Sats currently 94% on RA, BBS diminished, no distress noted at this time. RT will continue to monitor while in ED.

## 2022-02-13 NOTE — Progress Notes (Signed)
  [facility] will assume care on arrival to accepting facility. Until arrival, care as per EDP. However, [facility] available 24/7 for questions and assistance.   Nursing staff please page [facility] Admits and Consults ([phone]) as soon as the patient arrives to the hospital.  Guhan Bruington, DO [facility]  

## 2022-02-13 NOTE — ED Notes (Signed)
ED Provider at bedside with RT and this nurse

## 2022-02-13 NOTE — ED Provider Notes (Signed)
[facility] EMERGENC[facility] DEPT Provider Note   CSN: [id number] Arrival date & time: 02/13/22  2017     History  Chief Complaint  Patient presents with   Cough   Atrial Fibrillation    Rachel [name] is a 61 y.o. female.  The history is provided by medical records and the patient. No language interpreter was used.  Cough Cough characteristics:  Non-productive Severity:  Moderate Onset quality:  Gradual Duration:  3 days Timing:  Constant Progression:  Unchanged Chronicity:  New Relieved by:  Nothing Worsened by:  Nothing Ineffective treatments:  None tried Associated symptoms: chills, fever and shortness of breath   Associated symptoms: no chest pain, no headaches, no rash and no wheezing        Home Medications Prior to Admission medications   Medication Sig Start Date End Date Taking? Authorizing Provider  BISACODYL 5 MG EC tablet TAKE 1 TABLET(5 MG) BY MOUTH DAILY AS NEEDED FOR CONSTIPATION 06/16/21   Carole Civil, MD  cetirizine (ZYRTEC) 10 MG tablet Take 10 mg by mouth daily as needed for allergies.    [provider]  clonazePAM (KLONOPIN) 1 MG tablet Take 1 mg by mouth 3 (three) times daily as needed for anxiety.    [provider]  diclofenac Sodium (VOLTAREN) 1 % GEL Apply 2 g topically 3 (three) times daily as needed (pain).    [provider]  FLUoxetine (PROZAC) 20 MG capsule Take 20 mg by mouth daily. 05/07/21   [provider]  gabapentin (NEURONTIN) 100 MG capsule TAKE 1 CAPSULE(100 MG) BY MOUTH THREE TIMES DAILY 07/01/21   Carole Civil, MD  ibuprofen (ADVIL) 800 MG tablet Take 1 tablet (800 mg total) by mouth every 6 (six) hours as needed for mild pain or moderate pain. 03/20/21   Carole Civil, MD  Ketotifen Fumarate (ALLERGY EYE DROPS OP) Place 1 drop into both eyes daily as needed (allergies).    [provider]  Melatonin 10 MG TABS Take 20 mg by mouth at bedtime as needed  (sleep).    [provider]  methocarbamol (ROBAXIN) 500 MG tablet Take 1 tablet (500 mg total) by mouth every 6 (six) hours as needed for muscle spasms. 05/07/21   Carole Civil, MD  pantoprazole (PROTONIX) 40 MG tablet Take 40 mg by mouth daily. 07/13/19   [provider]  rosuvastatin (CRESTOR) 5 MG tablet Take 5 mg by mouth daily.    [provider]  telmisartan-hydrochlorothiazide (MICARDIS HCT) 80-25 MG tablet Take 1 tablet by mouth daily. 09/24/17   Tanda Rockers, MD      Allergies    Hydrocodone, Oxycodone, Penicillins, Precedex [dexmedetomidine hcl in nacl], Seroquel [quetiapine], and Shellfish allergy    Review of Systems   Review of Systems  Constitutional:  Positive for chills, fatigue and fever.  HENT:  Negative for congestion.   Eyes:  Negative for visual disturbance.  Respiratory:  Positive for cough, chest tightness and shortness of breath. Negative for wheezing.   Cardiovascular:  Negative for chest pain, palpitations and leg swelling.  Gastrointestinal:  Negative for abdominal pain, constipation, diarrhea, nausea and vomiting.  Genitourinary:  Negative for dysuria.  Musculoskeletal:  Negative for back pain, neck pain and neck stiffness.  Skin:  Negative for rash and wound.  Neurological:  Negative for light-headedness and headaches.  Psychiatric/Behavioral:  Negative for agitation.   All other systems reviewed and are negative.   Physical Exam Updated Vital Signs BP 109/81  Pulse (!) 114   Temp 100.3 F (37.9 C) (Oral)   Resp (!) 28   Ht 5' (1.524 m)   Wt 90.7 kg   SpO2 94%   BMI 39.05 kg/m  Physical Exam Vitals and nursing note reviewed.  Constitutional:      Appearance: She is well-developed. She is ill-appearing. She is not toxic-appearing or diaphoretic.  HENT:     Head: Normocephalic and atraumatic.     Nose: Nose normal.     Mouth/Throat:     Mouth: Mucous membranes are moist.  Eyes:     Conjunctiva/sclera:  Conjunctivae normal.     Pupils: Pupils are equal, round, and reactive to light.  Cardiovascular:     Rate and Rhythm: Regular rhythm. Tachycardia present.     Heart sounds: No murmur heard. Pulmonary:     Effort: No respiratory distress.     Breath sounds: No stridor. Rhonchi present. No wheezing.  Chest:     Chest wall: No tenderness.  Abdominal:     Palpations: Abdomen is soft.     Tenderness: There is no abdominal tenderness. There is no right CVA tenderness, left CVA tenderness, guarding or rebound.  Musculoskeletal:        General: No swelling or tenderness.     Cervical back: Neck supple. No tenderness.  Skin:    General: Skin is warm and dry.     Capillary Refill: Capillary refill takes less than 2 seconds.     Findings: No erythema or rash.  Neurological:     General: No focal deficit present.     Mental Status: She is alert.  Psychiatric:        Mood and Affect: Mood normal.     ED Results / Procedures / Treatments   Labs (all labs ordered are listed, but only abnormal results are displayed) Labs Reviewed  COMPREHENSIVE METABOLIC PANEL - Abnormal; Notable for the following components:      Result Value   Sodium 130 (*)    Chloride 93 (*)    Glucose, Bld 118 (*)    BUN 22 (*)    Creatinine, Ser 1.24 (*)    GFR, Estimated 50 (*)    All other components within normal limits  CBC WITH DIFFERENTIAL/PLATELET - Abnormal; Notable for the following components:   WBC 13.9 (*)    Neutro Abs 11.4 (*)    Monocytes Absolute 1.1 (*)    All other components within normal limits  TROPONIN I (HIGH SENSITIVITY) - Abnormal; Notable for the following components:   Troponin I (High Sensitivity) 28 (*)    All other components within normal limits  SARS CORONAVIRUS 2 BY RT PCR  RESP PANEL BY RT-PCR (FLU A&B, COVID) [id number]  CULTURE, BLOOD (ROUTINE X 2)  CULTURE, BLOOD (ROUTINE X 2)  URINE CULTURE  MRSA NEXT GEN BY PCR, NASAL  LACTIC ACID, PLASMA  MAGNESIUM  LACTIC ACID,  PLASMA  URINALYSIS, ROUTINE W REFLEX MICROSCOPIC  BRAIN NATRIURETIC PEPTIDE  TROPONIN I (HIGH SENSITIVITY)    EKG EKG Interpretation  Date/Time:  Friday February 13 2022 21:22:47 EDT Ventricular Rate:  127 PR Interval:    QRS Duration: 130 QT Interval:  358 QTC Calculation: 520 R Axis:   -12 Text Interpretation: Sinus tachycardia Left bundle branch block Abnormal ECG when coampred to prior, faster rate with LBBB. No STEMI Confirmed by Antony Blackbird [phone]) on 02/13/2022 10:40:18 PM  Radiology DG Chest Port 1 View  Result Date: 02/13/2022 CLINICAL DATA:  Cough.  EXAM: PORTABLE CHEST 1 VIEW COMPARISON:  December 20, 2021 FINDINGS: The heart size and mediastinal contours are within normal limits. There is moderate severity calcification of the aortic arch. Low lung volumes are noted. Mild areas of linear scarring and/or atelectasis are seen within the bilateral lung bases. There is no evidence of an acute infiltrate, pleural effusion or pneumothorax. The visualized skeletal structures are unremarkable. IMPRESSION: Mild bibasilar linear scarring and/or atelectasis. Electronically Signed   By: Virgina Norfolk M.D.   On: 02/13/2022 23:17    Procedures Procedures    CRITICAL CARE Performed by: Gwenyth Allegra Terrelle Ruffolo Total critical care time: 40 minutes Critical care time was exclusive of separately billable procedures and treating other patients. Critical care was necessary to treat or prevent imminent or life-threatening deterioration. Critical care was time spent personally by me on the following activities: development of treatment plan with patient and/or surrogate as well as nursing, discussions with consultants, evaluation of patient's response to treatment, examination of patient, obtaining history from patient or surrogate, ordering and performing treatments and interventions, ordering and review of laboratory studies, ordering and review of radiographic studies, pulse oximetry and  re-evaluation of patient's condition.   Medications Ordered in ED Medications  lactated ringers infusion (has no administration in time range)  lactated ringers bolus 1,000 mL (0 mLs Intravenous Hold 02/13/22 2355)    And  lactated ringers bolus 1,000 mL (0 mLs Intravenous Hold 02/13/22 2355)    And  lactated ringers bolus 1,000 mL (0 mLs Intravenous Hold 02/13/22 2354)  metroNIDAZOLE (FLAGYL) IVPB 500 mg (500 mg Intravenous New Bag/Given 02/13/22 2343)  ceFEPIme (MAXIPIME) 2 g in sodium chloride 0.9 % 100 mL IVPB (0 g Intravenous Stopped 02/13/22 2338)  vancomycin (VANCOCIN) IVPB 1000 mg/200 mL premix (has no administration in time range)    Followed by  vancomycin (VANCOCIN) 500 mg in sodium chloride 0.9 % 100 mL IVPB (has no administration in time range)  vancomycin (VANCOREADY) IVPB 750 mg/150 mL (has no administration in time range)  acetaminophen (TYLENOL) tablet 650 mg (650 mg Oral Given 02/13/22 2146)  sodium chloride 0.9 % bolus 1,000 mL (0 mLs Intravenous Stopped 02/13/22 2338)    ED Course/ Medical Decision Making/ A&P                           Medical Decision Making Amount and/or Complexity of Data Reviewed Labs: ordered. Radiology: ordered.  Risk OTC drugs. Prescription drug management. Decision regarding hospitalization.    Rachel [name] is a 61 y.o. female with a past medical history significant for hypertension, hyperlipidemia, anxiety, depression, GERD, and COPD who presents with several days of worsening subjective fevers, chills, cough, and shortness of breath.  Patient denies chest pain or palpitations and denies any vomiting, constipation, diarrhea, or urinary changes.  She has some mild nausea with her symptoms but denies any sick contacts.  She has had a dry cough for several days and has been more short of breath.  On my initial evaluation, patient has fever, heart rate into the 120s, and is tachypneic.  Oxygen saturations were around 90% and she was  placed on 2 L initially.  She is currently off of 2 L.  Blood pressure is also in the 80s during my initial evaluation.  On my exam, lungs have coarseness but did not appreciate significant wheezing.  Chest and abdomen nontender.  Good pulses in extremities.  Legs nontender and not critically edematous.  No  rash seen.  No stridor.  Patient does have increased work of breathing.  Given her fever and presentation I am concerned about sepsis.  Patient will be made a code sepsis and will be started on broad-spectrum antibiotics.   Patient's labs began to return with a white count of 13.9.  Lactic acid was normal but patient does have an increased creatinine of 1.2.  COVID test is negative.  Patient is getting sepsis work-up however she will need admission for suspected pneumonia causing sepsis.  Given her lack of any chest pain, or history, I low suspicion for thromboembolic etiology with her fevers.  Anticipate admission after work-up is completed.  Blood pressure has improved into the 120s.  Fever is improving.  Patient does have slight elevated troponin of 28, will trend.  COVID is negative.  Chest x-ray shows some atelectasis versus scarring however given the symptoms and exam I am concerned this is pneumonia.  We will call for admission for further management of sepsis and pneumonia.  Patient had admission orders placed by hospitalist.  Blood pressure has improved however after some more fluids patient started sounding more wet and oxygen started to dip into the mid 80s around 87%.  I suspect there may be degree of sleep apnea or symptoms related to her falling asleep however we will pause the fluids and order a BNP.  The admitting team requested a CT chest which we will also obtain.  She will be admitted for further management of suspected sepsis in the setting of pneumonia.        Final Clinical Impression(s) / ED Diagnoses Final diagnoses:  Sepsis, due to unspecified organism, unspecified  whether acute organ dysfunction present [facility])  Community acquired pneumonia, unspecified laterality     Clinical Impression: 1. Sepsis, due to unspecified organism, unspecified whether acute organ dysfunction present ([place])   2. Community acquired pneumonia, unspecified laterality     Disposition: Admit  This note was prepared with assistance of Systems analyst. Occasional wrong-word or sound-a-like substitutions may have occurred due to the inherent limitations of voice recognition software.     Landen Knoedler, Gwenyth Allegra, MD 02/14/22 0002

## 2022-02-13 NOTE — Sepsis Progress Note (Signed)
Sepsis protocol is being followed by [facility]. 

## 2022-02-14 ENCOUNTER — Emergency Department (HOSPITAL_BASED_OUTPATIENT_CLINIC_OR_DEPARTMENT_OTHER): Payer: 59

## 2022-02-14 ENCOUNTER — Observation Stay (HOSPITAL_COMMUNITY): Payer: 59

## 2022-02-14 DIAGNOSIS — E669 Obesity, unspecified: Secondary | ICD-10-CM | POA: Diagnosis not present

## 2022-02-14 DIAGNOSIS — I700 Atherosclerosis of aorta: Secondary | ICD-10-CM | POA: Diagnosis not present

## 2022-02-14 DIAGNOSIS — R778 Other specified abnormalities of plasma proteins: Secondary | ICD-10-CM

## 2022-02-14 DIAGNOSIS — J189 Pneumonia, unspecified organism: Secondary | ICD-10-CM

## 2022-02-14 DIAGNOSIS — F32A Depression, unspecified: Secondary | ICD-10-CM | POA: Diagnosis not present

## 2022-02-14 DIAGNOSIS — Z20822 Contact with and (suspected) exposure to covid-19: Secondary | ICD-10-CM | POA: Diagnosis not present

## 2022-02-14 DIAGNOSIS — I428 Other cardiomyopathies: Secondary | ICD-10-CM | POA: Diagnosis not present

## 2022-02-14 DIAGNOSIS — R6520 Severe sepsis without septic shock: Secondary | ICD-10-CM | POA: Diagnosis not present

## 2022-02-14 DIAGNOSIS — E7800 Pure hypercholesterolemia, unspecified: Secondary | ICD-10-CM

## 2022-02-14 DIAGNOSIS — E871 Hypo-osmolality and hyponatremia: Secondary | ICD-10-CM

## 2022-02-14 DIAGNOSIS — J9601 Acute respiratory failure with hypoxia: Secondary | ICD-10-CM | POA: Diagnosis not present

## 2022-02-14 DIAGNOSIS — A419 Sepsis, unspecified organism: Secondary | ICD-10-CM | POA: Diagnosis not present

## 2022-02-14 DIAGNOSIS — I110 Hypertensive heart disease with heart failure: Secondary | ICD-10-CM | POA: Diagnosis not present

## 2022-02-14 DIAGNOSIS — I5021 Acute systolic (congestive) heart failure: Secondary | ICD-10-CM | POA: Diagnosis not present

## 2022-02-14 DIAGNOSIS — I5032 Chronic diastolic (congestive) heart failure: Secondary | ICD-10-CM | POA: Diagnosis present

## 2022-02-14 DIAGNOSIS — R9431 Abnormal electrocardiogram [ECG] [EKG]: Secondary | ICD-10-CM | POA: Diagnosis not present

## 2022-02-14 DIAGNOSIS — J9621 Acute and chronic respiratory failure with hypoxia: Secondary | ICD-10-CM

## 2022-02-14 DIAGNOSIS — N179 Acute kidney failure, unspecified: Secondary | ICD-10-CM

## 2022-02-14 DIAGNOSIS — J9811 Atelectasis: Secondary | ICD-10-CM | POA: Diagnosis not present

## 2022-02-14 DIAGNOSIS — R059 Cough, unspecified: Secondary | ICD-10-CM | POA: Diagnosis not present

## 2022-02-14 DIAGNOSIS — I10 Essential (primary) hypertension: Secondary | ICD-10-CM

## 2022-02-14 DIAGNOSIS — R0602 Shortness of breath: Secondary | ICD-10-CM | POA: Diagnosis not present

## 2022-02-14 DIAGNOSIS — J440 Chronic obstructive pulmonary disease with acute lower respiratory infection: Secondary | ICD-10-CM | POA: Diagnosis not present

## 2022-02-14 LAB — SODIUM, URINE, RANDOM: Sodium, Ur: 19 mmol/L

## 2022-02-14 LAB — BLOOD GAS, VENOUS
Acid-Base Excess: 1.9 mmol/L (ref 0.0–2.0)
Bicarbonate: 29 mmol/L — ABNORMAL HIGH (ref 20.0–28.0)
O2 Saturation: 49.9 %
Patient temperature: 37
pCO2, Ven: 55 mmHg (ref 44–60)
pH, Ven: 7.33 (ref 7.25–7.43)
pO2, Ven: 33 mmHg (ref 32–45)

## 2022-02-14 LAB — URINALYSIS, ROUTINE W REFLEX MICROSCOPIC
Bilirubin Urine: NEGATIVE
Glucose, UA: NEGATIVE mg/dL
Hgb urine dipstick: NEGATIVE
Ketones, ur: NEGATIVE mg/dL
Leukocytes,Ua: NEGATIVE
Nitrite: NEGATIVE
Protein, ur: 30 mg/dL — AB
Specific Gravity, Urine: 1.011 (ref 1.005–1.030)
pH: 5.5 (ref 5.0–8.0)

## 2022-02-14 LAB — CBC WITH DIFFERENTIAL/PLATELET
Abs Immature Granulocytes: 0.03 10*3/uL (ref 0.00–0.07)
Basophils Absolute: 0 10*3/uL (ref 0.0–0.1)
Basophils Relative: 0 %
Eosinophils Absolute: 0.1 10*3/uL (ref 0.0–0.5)
Eosinophils Relative: 1 %
HCT: 36.4 % (ref 36.0–46.0)
Hemoglobin: 11.6 g/dL — ABNORMAL LOW (ref 12.0–15.0)
Immature Granulocytes: 0 %
Lymphocytes Relative: 8 %
Lymphs Abs: 0.8 10*3/uL (ref 0.7–4.0)
MCH: 27.8 pg (ref 26.0–34.0)
MCHC: 31.9 g/dL (ref 30.0–36.0)
MCV: 87.3 fL (ref 80.0–100.0)
Monocytes Absolute: 1 10*3/uL (ref 0.1–1.0)
Monocytes Relative: 9 %
Neutro Abs: 9 10*3/uL — ABNORMAL HIGH (ref 1.7–7.7)
Neutrophils Relative %: 82 %
Platelets: 358 10*3/uL (ref 150–400)
RBC: 4.17 MIL/uL (ref 3.87–5.11)
RDW: 14.9 % (ref 11.5–15.5)
WBC: 10.8 10*3/uL — ABNORMAL HIGH (ref 4.0–10.5)
nRBC: 0 % (ref 0.0–0.2)

## 2022-02-14 LAB — TSH: TSH: 0.127 u[IU]/mL — ABNORMAL LOW (ref 0.350–4.500)

## 2022-02-14 LAB — OSMOLALITY, URINE: Osmolality, Ur: 643 mOsm/kg (ref 300–900)

## 2022-02-14 LAB — COMPREHENSIVE METABOLIC PANEL
ALT: 18 U/L (ref 0–44)
AST: 28 U/L (ref 15–41)
Albumin: 3.2 g/dL — ABNORMAL LOW (ref 3.5–5.0)
Alkaline Phosphatase: 96 U/L (ref 38–126)
Anion gap: 10 (ref 5–15)
BUN: 20 mg/dL (ref 6–20)
CO2: 24 mmol/L (ref 22–32)
Calcium: 9 mg/dL (ref 8.9–10.3)
Chloride: 101 mmol/L (ref 98–111)
Creatinine, Ser: 0.81 mg/dL (ref 0.44–1.00)
GFR, Estimated: >60 mL/min (ref >60)
Glucose, Bld: 120 mg/dL — ABNORMAL HIGH (ref 70–99)
Potassium: 3.8 mmol/L (ref 3.5–5.1)
Sodium: 135 mmol/L (ref 135–145)
Total Bilirubin: 1.3 mg/dL — ABNORMAL HIGH (ref 0.3–1.2)
Total Protein: 7.5 g/dL (ref 6.5–8.1)

## 2022-02-14 LAB — HIV ANTIBODY (ROUTINE TESTING W REFLEX): HIV Screen 4th Generation wRfx: NONREACTIVE

## 2022-02-14 LAB — CREATININE, URINE, RANDOM: Creatinine, Urine: 171 mg/dL

## 2022-02-14 LAB — OSMOLALITY: Osmolality: 288 mOsm/kg (ref 275–295)

## 2022-02-14 LAB — MAGNESIUM
Magnesium: 2 mg/dL (ref 1.7–2.4)
Magnesium: 2.3 mg/dL (ref 1.7–2.4)

## 2022-02-14 LAB — PHOSPHORUS: Phosphorus: 2.6 mg/dL (ref 2.5–4.6)

## 2022-02-14 LAB — PROCALCITONIN: Procalcitonin: 1.03 ng/mL

## 2022-02-14 LAB — TROPONIN I (HIGH SENSITIVITY)
Troponin I (High Sensitivity): 27 ng/L — ABNORMAL HIGH (ref <18)
Troponin I (High Sensitivity): 18 ng/L — ABNORMAL HIGH (ref <18)

## 2022-02-14 LAB — BRAIN NATRIURETIC PEPTIDE: B Natriuretic Peptide: 87.5 pg/mL (ref 0.0–100.0)

## 2022-02-14 LAB — CK: Total CK: 124 U/L (ref 38–234)

## 2022-02-14 LAB — LACTIC ACID, PLASMA
Lactic Acid, Venous: 0.9 mmol/L (ref 0.5–1.9)
Lactic Acid, Venous: 1.2 mmol/L (ref 0.5–1.9)
Lactic Acid, Venous: 1.7 mmol/L (ref 0.5–1.9)

## 2022-02-14 LAB — MRSA NEXT GEN BY PCR, NASAL: MRSA by PCR Next Gen: NOT DETECTED

## 2022-02-14 LAB — T4, FREE: Free T4: 1.18 ng/dL — ABNORMAL HIGH (ref 0.61–1.12)

## 2022-02-14 LAB — RESP PANEL BY RT-PCR (FLU A&B, COVID) ARPGX2
Influenza A by PCR: NEGATIVE
Influenza B by PCR: NEGATIVE
SARS Coronavirus 2 by RT PCR: NEGATIVE

## 2022-02-14 MED ORDER — GUAIFENESIN ER 600 MG PO TB12
600.0000 mg | ORAL_TABLET | Freq: Two times a day (BID) | ORAL | Status: DC
  Administered 2022-02-14 – 2022-02-18 (×9): 600 mg via ORAL
  Filled 2022-02-14 (×9): qty 1

## 2022-02-14 MED ORDER — SODIUM CHLORIDE 0.9% FLUSH
3.0000 mL | Freq: Two times a day (BID) | INTRAVENOUS | Status: DC
  Administered 2022-02-15 – 2022-02-18 (×8): 3 mL via INTRAVENOUS

## 2022-02-14 MED ORDER — IPRATROPIUM BROMIDE 0.02 % IN SOLN
0.5000 mg | Freq: Three times a day (TID) | RESPIRATORY_TRACT | Status: DC
  Administered 2022-02-15 (×3): 0.5 mg via RESPIRATORY_TRACT
  Filled 2022-02-14 (×3): qty 2.5

## 2022-02-14 MED ORDER — ENOXAPARIN SODIUM 30 MG/0.3ML IJ SOSY: 30.0000 mg | PREFILLED_SYRINGE | INTRAMUSCULAR | Status: DC

## 2022-02-14 MED ORDER — SODIUM CHLORIDE 0.9% FLUSH: 3.0000 mL | INTRAVENOUS | Status: DC | PRN

## 2022-02-14 MED ORDER — ALBUTEROL SULFATE (2.5 MG/3ML) 0.083% IN NEBU
2.5000 mg | INHALATION_SOLUTION | RESPIRATORY_TRACT | Status: DC | PRN
  Administered 2022-02-19: 2.5 mg via RESPIRATORY_TRACT
  Filled 2022-02-14: qty 3

## 2022-02-14 MED ORDER — ACETAMINOPHEN 325 MG PO TABS
650.0000 mg | ORAL_TABLET | Freq: Once | ORAL | Status: AC
  Administered 2022-02-14: 650 mg via ORAL

## 2022-02-14 MED ORDER — SODIUM CHLORIDE 0.9 % IV SOLN
100.0000 mg | Freq: Two times a day (BID) | INTRAVENOUS | Status: DC
  Administered 2022-02-14 – 2022-02-18 (×9): 100 mg via INTRAVENOUS
  Filled 2022-02-14 (×11): qty 100

## 2022-02-14 MED ORDER — FUROSEMIDE 10 MG/ML IJ SOLN
20.0000 mg | Freq: Once | INTRAMUSCULAR | Status: AC
  Administered 2022-02-14: 20 mg via INTRAVENOUS
  Filled 2022-02-14: qty 2

## 2022-02-14 MED ORDER — IPRATROPIUM BROMIDE 0.02 % IN SOLN
0.2500 mg | Freq: Four times a day (QID) | RESPIRATORY_TRACT | Status: DC
Start: 2022-02-14 — End: 2022-02-14
  Administered 2022-02-14: 0.25 mg via RESPIRATORY_TRACT

## 2022-02-14 MED ORDER — SODIUM CHLORIDE 0.9 % IV SOLN: 250.0000 mL | INTRAVENOUS | Status: DC | PRN

## 2022-02-14 MED ORDER — IPRATROPIUM BROMIDE 0.02 % IN SOLN
0.5000 mg | Freq: Four times a day (QID) | RESPIRATORY_TRACT | Status: DC
Start: 2022-02-15 — End: 2022-02-14

## 2022-02-14 MED ORDER — LEVALBUTEROL HCL 0.63 MG/3ML IN NEBU
0.6300 mg | INHALATION_SOLUTION | Freq: Three times a day (TID) | RESPIRATORY_TRACT | Status: DC
  Administered 2022-02-15 (×3): 0.63 mg via RESPIRATORY_TRACT
  Filled 2022-02-14 (×4): qty 3

## 2022-02-14 MED ORDER — ACETAMINOPHEN 650 MG RE SUPP: 650.0000 mg | Freq: Four times a day (QID) | RECTAL | Status: DC | PRN

## 2022-02-14 MED ORDER — IPRATROPIUM BROMIDE 0.02 % IN SOLN
RESPIRATORY_TRACT | Status: AC
  Filled 2022-02-14: qty 2.5

## 2022-02-14 MED ORDER — ACETAMINOPHEN 325 MG PO TABS
650.0000 mg | ORAL_TABLET | Freq: Four times a day (QID) | ORAL | Status: DC | PRN
  Administered 2022-02-14: 650 mg via ORAL
  Filled 2022-02-14: qty 2

## 2022-02-14 NOTE — H&P (Addendum)
Rachel [name] OVL:[id number] DOB: [birth year] DOA: 02/13/2022   PCP: Elfredia Nevins, MD   Outpatient Specialists:     Pulmonary     Dr. Sherene Sires    Patient arrived to ER on 02/13/22 at 2017 Referred by Attending Therisa Doyne, MD   Patient coming from:    home Lives alone,      Chief Complaint:   Chief Complaint  Patient presents with   Cough   Atrial Fibrillation    HPI: Rachel [name] is a 61 y.o. female with medical history significant of COPD,? HTN, HLD, diastolic CHF, OSA, obesity    Presented with  cough shortness of breath Has not been feeling well for past 1 week yesterday presented to [facility] with cough somewhat productive some congestion noted to be febrile in triage and hypotensive Patient endorsing chills fatigue and fever shortness of breath and chest tightness noted to be initially cardiac to 114 blood pressure at 109/81 respirations and 28 initially was tolerating room air after few fluid resuscitation with 3 L required O2 due to hypoxia with O2 sats in mid 80s on room air Patient was noted to have pneumonia on CT and hospitalist was called for transfer to [facility] Of note in 2018 patient had admission with hypoxemia secondary to influenza and supraclavicular superficial bacterial infection resulting in ARDS She developed septic shock requiring pressors at the time She reports feeling very sleepy few days ago   No sick contacts Reports only CP when cough She cough up greenish sputum She says pulmonology did not think she has COPD She does not smoke  No ETOH Reports no change in appetite Denies leg swelling   Initial COVID TEST  NEGATIVE   Lab Results  Component Value Date   SARSCOV2NAA NEGATIVE 02/13/2022   SARSCOV2NAA NEGATIVE 02/13/2022   SARSCOV2NAA NEGATIVE 03/18/2021   SARSCOV2NAA NEGATIVE 03/13/2021     Regarding pertinent Chronic problems:    Hyperlipidemia -  on statins Crestor Lipid Panel     Component Value Date/Time    TRIG 179 (H) 10/21/2016 2039     HTN on Micardis   chronic CHF diastolic  - last echo 2018 Grade 2 diastolic CHF     obesity-   BMI Readings from Last 1 Encounters:  02/13/22 39.05 kg/m       COPD -   followed by pulmonology   not  on baseline oxygen       OSA - noncompliant with CPAP    While in ER:   Found to have PNA And sepsis started on broad spectrum abx and aggressive fluid ressusitation Developed hypoxia  Was transiently on CPAP      CT chest -   Right lower lobe pneumonia. Clinical correlation and follow-up to resolution recommended.  Following Medications were ordered in ER: Medications  lactated ringers infusion ( Intravenous New Bag/Given 02/14/22 0525)  lactated ringers bolus 1,000 mL (0 mLs Intravenous Hold 02/13/22 2355)    And  lactated ringers bolus 1,000 mL (0 mLs Intravenous Hold 02/13/22 2355)    And  lactated ringers bolus 1,000 mL (0 mLs Intravenous Hold 02/13/22 2354)  ceFEPIme (MAXIPIME) 2 g in sodium chloride 0.9 % 100 mL IVPB (0 g Intravenous Stopped 02/14/22 1447)  vancomycin (VANCOREADY) IVPB 750 mg/150 mL (has no administration in time range)  acetaminophen (TYLENOL) tablet 650 mg (650 mg Oral Given 02/13/22 2146)  sodium chloride 0.9 % bolus 1,000 mL (0 mLs Intravenous Stopped 02/13/22 2338)  metroNIDAZOLE (FLAGYL) IVPB  500 mg (0 mg Intravenous Stopped 02/14/22 0102)  vancomycin (VANCOCIN) IVPB 1000 mg/200 mL premix (0 mg Intravenous Stopped 02/14/22 0327)    Followed by  vancomycin (VANCOCIN) 500 mg in sodium chloride 0.9 % 100 mL IVPB (0 mg Intravenous Stopped 02/14/22 0427)  acetaminophen (TYLENOL) tablet 650 mg (650 mg Oral Given 02/14/22 1008)    ED Triage Vitals  Enc Vitals Group     BP 02/13/22 2117 120/68     Pulse Rate 02/13/22 2117 (!) 129     Resp 02/13/22 2117 (!) 24     Temp 02/13/22 2117 (!) 101.5 F (38.6 C)     Temp Source 02/13/22 2201 Oral     SpO2 02/13/22 2117 94 %     Weight 02/13/22 2118 199 lb 15.3 oz (90.7 kg)      Height 02/13/22 2118 5' (1.524 m)     Head Circumference --      Peak Flow --      Pain Score 02/13/22 2118 4     Pain Loc --      Pain Edu? --      Excl. in [place]? --   TMAX(24)@     _________________________________________ Significant initial  Findings: Abnormal Labs Reviewed  COMPREHENSIVE METABOLIC PANEL - Abnormal; Notable for the following components:      Result Value   Sodium 130 (*)    Chloride 93 (*)    Glucose, Bld 118 (*)    BUN 22 (*)    Creatinine, Ser 1.24 (*)    GFR, Estimated 50 (*)    All other components within normal limits  CBC WITH DIFFERENTIAL/PLATELET - Abnormal; Notable for the following components:   WBC 13.9 (*)    Neutro Abs 11.4 (*)    Monocytes Absolute 1.1 (*)    All other components within normal limits  URINALYSIS, ROUTINE W REFLEX MICROSCOPIC - Abnormal; Notable for the following components:   APPearance HAZY (*)    Protein, ur 30 (*)    Bacteria, UA MANY (*)    All other components within normal limits  TROPONIN I (HIGH SENSITIVITY) - Abnormal; Notable for the following components:   Troponin I (High Sensitivity) 28 (*)    All other components within normal limits  TROPONIN I (HIGH SENSITIVITY) - Abnormal; Notable for the following components:   Troponin I (High Sensitivity) 27 (*)    All other components within normal limits     _________________________ Troponin 25 - 28 ECG: Ordered Personally reviewed and interpreted by me showing: HR : 127 Rhythm: Sinus tachycardia  LBBB,    QTC 520  ____________________ This patient meets SIRS Criteria and may be septic.    The recent clinical data is shown below. Vitals:   02/14/22 1427 02/14/22 1430 02/14/22 1530 02/14/22 1852  BP:  (!) 105/58 114/63 (!) 116/54  Pulse:  98 100 (!) 111  Resp:  (!) 25 (!) 21 19  Temp: 99.3 F (37.4 C)     TempSrc: Axillary     SpO2:  99% 100%   Weight:      Height:        WBC     Component Value Date/Time   WBC 13.9 (H) 02/13/2022 2146    LYMPHSABS 1.2 02/13/2022 2146   MONOABS 1.1 (H) 02/13/2022 2146   EOSABS 0.0 02/13/2022 2146   BASOSABS 0.1 02/13/2022 2146      Lactic Acid, Venous    Component Value Date/Time   LATICACIDVEN 1.2 02/13/2022 2352  Procalcitonin   Ordered     UA bacteruria   Urine analysis:    Component Value Date/Time   COLORURINE YELLOW 02/14/2022 0330   APPEARANCEUR HAZY (A) 02/14/2022 0330   LABSPEC 1.011 02/14/2022 0330   PHURINE 5.5 02/14/2022 0330   GLUCOSEU NEGATIVE 02/14/2022 0330   HGBUR NEGATIVE 02/14/2022 0330   BILIRUBINUR NEGATIVE 02/14/2022 0330   KETONESUR NEGATIVE 02/14/2022 0330   PROTEINUR 30 (A) 02/14/2022 0330   NITRITE NEGATIVE 02/14/2022 0330   LEUKOCYTESUR NEGATIVE 02/14/2022 0330    Results for orders placed or performed during the hospital encounter of 02/13/22  SARS Coronavirus 2 by RT PCR (hospital order, performed in [facility] hospital lab) *cepheid single result test* Anterior Nasal Swab     Status: None   Collection Time: 02/13/22  9:22 PM   Specimen: Anterior Nasal Swab  Result Value Ref Range Status   SARS Coronavirus 2 by RT PCR NEGATIVE NEGATIVE Final        Resp Panel by RT-PCR (Flu A&B, Covid) Anterior Nasal Swab     Status: None   Collection Time: 02/13/22 11:16 PM   Specimen: Anterior Nasal Swab  Result Value Ref Range Status   SARS Coronavirus 2 by RT PCR NEGATIVE NEGATIVE Final         Influenza A by PCR NEGATIVE NEGATIVE Final   Influenza B by PCR NEGATIVE NEGATIVE Final        MRSA Next Gen by PCR, Nasal     Status: None   Collection Time: 02/13/22 11:16 PM   Specimen: Nasal Mucosa; Nasal Swab  Result Value Ref Range Status   MRSA by PCR Next Gen NOT DETECTED NOT DETECTED Final    Comment: (NOTE) The GeneXpert MRSA Assay (FDA approved for NASAL specimens only), is one component of a comprehensive MRSA colonization surveillance program. It is not intended to diagnose MRSA infection nor to guide or monitor treatment for MRSA  infections. Test performance is not FDA approved in patients less than 31 years old. Performed at [facility], [place] [address] [place], [place] [address]     _______________________________________________ Hospitalist was called for admission for   Sepsis,   Community acquired pneumonia,      The following Work up has been ordered so far:  Orders Placed This Encounter  Procedures   Resp Panel by RT-PCR (Flu A&B, Covid) Anterior Nasal Swab   Blood culture (routine x 2)   Urine Culture   MRSA Next Gen by PCR, Nasal   DG Chest Port 1 View   CT Chest Wo Contrast   DG CHEST PORT 1 VIEW   Lactic acid, plasma   Comprehensive metabolic panel   CBC with Differential   Magnesium   Urinalysis, Routine w reflex microscopic   Brain natriuretic peptide   Blood gas, venous   CBC with Differential/Platelet   Comprehensive metabolic panel   Magnesium   Lactic acid, plasma   Phosphorus   Sodium, urine, random   Osmolality, urine   Creatinine, urine, random   Osmolality   CK   Procalcitonin - Baseline   Procalcitonin   TSH   Diet Heart Room service appropriate? Yes; Fluid consistency: Thin   Document Height and Actual Weight   DO NOT delay antibiotics if unable to obtain blood culture.   Cardiac monitoring   Monitor O2 SATs   Cardiac monitoring   Code Sepsis activation.  This occurs automatically when order is signed and prioritizes pharmacy, lab, and radiology services  for STAT collections and interventions.  If [facility] downtime, call [facility] [phone]) to activate Code Sepsis.   aztreonam (AZACTAM) per pharmacy consult   vancomycin per pharmacy consult   Consult to hospitalist   pharmacy consult   CPAP   Oxygen therapy Keep 02 saturation: >/= 88%   ED EKG   Insert 2nd peripheral IV if not already present.   Place in observation (patient's expected length of stay will be less than 2 midnights)     OTHER Significant initial  Findings:  labs showing:    Recent Labs   Lab 02/13/22 2146 02/13/22 2316  NA 130*  --   K 3.9  --   CO2 24  --   GLUCOSE 118*  --   BUN 22*  --   CREATININE 1.24*  --   CALCIUM 9.5  --   MG  --  2.0    Cr   Up from baseline see below Lab Results  Component Value Date   CREATININE 1.24 (H) 02/13/2022   CREATININE 0.54 03/13/2021   CREATININE 0.64 02/16/2020    Recent Labs  Lab 02/13/22 2146  AST 20  ALT 13  ALKPHOS 91  BILITOT 1.2  PROT 8.1  ALBUMIN 4.1   Lab Results  Component Value Date   CALCIUM 9.5 02/13/2022   PHOS 3.7 10/23/2016    Plt: Lab Results  Component Value Date   PLT 391 02/13/2022     COVID-19 Labs  No results for input(s): "DDIMER", "FERRITIN", "LDH", "CRP" in the last 72 hours.  Lab Results  Component Value Date   SARSCOV2NAA NEGATIVE 02/13/2022   SARSCOV2NAA NEGATIVE 02/13/2022   [place] NEGATIVE 03/18/2021   [place] NEGATIVE 03/13/2021    Venous  Blood Gas result:  pH   Latest Reference Range & Units Most Recent  pH, Ven 7.25 - 7.43  7.33 02/14/22 20:00  pCO2, Ven 44 - 60 mmHg 55 02/14/22 20:00  pO2, Ven 32 - 45 mmHg 33 02/14/22 20:00    ABG    Component Value Date/Time   PHART 7.443 10/17/2016 1559   PCO2ART 51.0 (H) 10/17/2016 1559   PO2ART 169.0 (H) 10/17/2016 1559   HCO3 34.9 (H) 10/17/2016 1559   TCO2 36 10/17/2016 1559   ACIDBASEDEF 3.0 (H) 10/13/2016 0257   O2SAT 100.0 10/17/2016 1559      Recent Labs  Lab 02/13/22 2146  WBC 13.9*  NEUTROABS 11.4*  HGB 12.1  HCT 37.0  MCV 84.5  PLT 391    HG/HCT  stable     Component Value Date/Time   HGB 12.1 02/13/2022 2146   HCT 37.0 02/13/2022 2146   MCV 84.5 02/13/2022 2146     Cardiac Panel (last 3 results) No results for input(s): "CKTOTAL", "CKMB", "TROPONINI", "RELINDX" in the last 72 hours.  .car BNP (last 3 results) Recent Labs    02/14/22 0017  BNP 87.5          Cultures:    Component Value Date/Time   SDES TRACHEAL ASPIRATE 10/19/2016 1502   SPECREQUEST Normal 10/19/2016  1502   CULT Consistent with normal respiratory flora. 10/19/2016 1502   REPTSTATUS 10/21/2016 FINAL 10/19/2016 1502     Radiological Exams on Admission: DG CHEST PORT 1 VIEW  Result Date: 02/14/2022 CLINICAL DATA:  Shortness of breath and cough. EXAM: PORTABLE CHEST 1 VIEW COMPARISON:  Chest radiograph dated 02/13/2022 and CT dated 02/15/2019. FINDINGS: Shallow inspiration. There is mild cardiomegaly with mild vascular congestion. Bibasilar atelectasis. Right lung base opacity seen  on the earlier CT is suboptimally visualized on radiograph. No pleural effusion pneumothorax. Atherosclerotic calcification of the aortic arch. No acute osseous pathology. IMPRESSION: Mild cardiomegaly with mild vascular congestion. Electronically Signed   By: Anner Crete M.D.   On: 02/14/2022 19:54   CT Chest Wo Contrast  Result Date: 02/14/2022 CLINICAL DATA:  Pneumonia. EXAM: CT CHEST WITHOUT CONTRAST TECHNIQUE: Multidetector CT imaging of the chest was performed following the standard protocol without IV contrast. RADIATION DOSE REDUCTION: This exam was performed according to the departmental dose-optimization program which includes automated exposure control, adjustment of the mA and/or kV according to patient size and/or use of iterative reconstruction technique. COMPARISON:  Chest radiograph dated 02/13/2022. FINDINGS: Evaluation of this exam is limited in the absence of intravenous contrast as well as due to respiratory motion. Cardiovascular: Top-normal cardiac size. No pericardial effusion. Three-vessel coronary vascular calcification. Mild atherosclerotic calcification of the thoracic aorta. No aneurysmal dilatation. The central pulmonary arteries are grossly unremarkable. Mediastinum/Nodes: No obvious hilar or mediastinal adenopathy. The esophagus and thyroid gland are grossly unremarkable. No mediastinal fluid collection. Lungs/Pleura: There is an area of consolidation in the right lower lobe most consistent  with pneumonia. Clinical correlation and follow-up to resolution recommended. Linear atelectasis or scarring in the lingula. There is no pleural effusion or pneumothorax. The central airways are patent. Upper Abdomen: No acute abnormality. Musculoskeletal: Degenerative changes of the spine. No acute osseous pathology. IMPRESSION: 1. Right lower lobe pneumonia. Clinical correlation and follow-up to resolution recommended. 2. Aortic Atherosclerosis (ICD10-I70.0). Electronically Signed   By: Anner Crete M.D.   On: 02/14/2022 00:29   DG Chest Port 1 View  Result Date: 02/13/2022 CLINICAL DATA:  Cough. EXAM: PORTABLE CHEST 1 VIEW COMPARISON:  December 20, 2021 FINDINGS: The heart size and mediastinal contours are within normal limits. There is moderate severity calcification of the aortic arch. Low lung volumes are noted. Mild areas of linear scarring and/or atelectasis are seen within the bilateral lung bases. There is no evidence of an acute infiltrate, pleural effusion or pneumothorax. The visualized skeletal structures are unremarkable. IMPRESSION: Mild bibasilar linear scarring and/or atelectasis. Electronically Signed   By: Virgina Norfolk M.D.   On: 02/13/2022 23:17   _______________________________________________________________________________________________________ Latest  Blood pressure (!) 116/54, pulse (!) 111, temperature 99.3 F (37.4 C), temperature source Axillary, resp. rate 19, height 5' (1.524 m), weight 90.7 kg, SpO2 100 %.   Vitals  labs and radiology finding personally reviewed  Review of Systems:    Pertinent positives include:   Fevers, chills, fatigue,   shortness of breath at rest.   dyspnea on exertion,  excess mucus,  productive cough,  Constitutional:  No weight loss, night sweats,weight loss  HEENT:  No headaches, Difficulty swallowing,Tooth/dental problems,Sore throat,  No sneezing, itching, ear ache, nasal congestion, post nasal drip,  Cardio-vascular:  No chest  pain, Orthopnea, PND, anasarca, dizziness, palpitations.no Bilateral lower extremity swelling  GI:  No heartburn, indigestion, abdominal pain, nausea, vomiting, diarrhea, change in bowel habits, loss of appetite, melena, blood in stool, hematemesis Resp:  No non-productive cough, No coughing up of blood.No change in color of mucus.No wheezing. Skin:  no rash or lesions. No jaundice GU:  no dysuria, change in color of urine, no urgency or frequency. No straining to urinate.  No flank pain.  Musculoskeletal:  No joint pain or no joint swelling. No decreased range of motion. No back pain.  Psych:  No change in mood or affect. No depression or anxiety. No memory  loss.  Neuro: no localizing neurological complaints, no tingling, no weakness, no double vision, no gait abnormality, no slurred speech, no confusion  All systems reviewed and apart from [place] all are negative _______________________________________________________________________________________________ Past Medical History:   Past Medical History:  Diagnosis Date   Anemia    Anxiety    Arthritis    in both knees   Depression    GERD (gastroesophageal reflux disease)    Hyperlipidemia    Hypertension    Sleep apnea     Past Surgical History:  Procedure Laterality Date   CARPAL TUNNEL RELEASE Right    CATARACT EXTRACTION W/PHACO Right 02/19/2020   Procedure: CATARACT EXTRACTION PHACO AND INTRAOCULAR LENS PLACEMENT ([place]);  Surgeon: Baruch Goldmann, MD;  Location: [facility];  Service: Ophthalmology;  Laterality: Right;  CDE: 4.83   CESAREAN SECTION     cesarian  1990   COLONOSCOPY N/A 08/10/2016   Procedure: COLONOSCOPY;  Surgeon: Danie Binder, MD;  Location: [facility] SUITE;  Service: Endoscopy;  Laterality: N/A;  10:30 AM   fallopian tube removed N/A    TOTAL KNEE ARTHROPLASTY Left 03/18/2021   Procedure: TOTAL KNEE ARTHROPLASTY;  Surgeon: Carole Civil, MD;  Location: [facility];  Service: Orthopedics;  Laterality: Left;     Social History:  Ambulatory   independently       reports that she quit smoking about 5 years ago. Her smoking use included cigarettes. She has a 30.00 pack-year smoking history. She has never used smokeless tobacco. She reports current alcohol use. She reports that she does not currently use drugs.     Family History:   Family History  Problem Relation Age of Onset   Cancer Mother        lung   Heart attack Father    Stroke Paternal Grandmother    Kidney failure Maternal Grandmother    Cancer Maternal Grandfather        lung   Seizures Brother    Sarcoidosis Sister    ______________________________________________________________________________________________ Allergies: Allergies  Allergen Reactions   Hydrocodone Nausea And Vomiting   Oxycodone Nausea And Vomiting    Codeine, hydrocodone, most pain pills.   Penicillins Itching, Nausea And Vomiting and Swelling    Has patient had a PCN reaction causing immediate rash, facial/tongue/throat swelling, SOB or lightheadedness with hypotension: Yes, around the eyes Has patient had a PCN reaction causing severe rash involving mucus membranes or skin necrosis: No Has patient had a PCN reaction that required hospitalization: No Has patient had a PCN reaction occurring within the last 10 years: No  If all of the above answers are "NO", then may proceed with Cephalosporin use.    Precedex [Dexmedetomidine Hcl In Nacl]     Increased agitation/confusion in ICU [April 2018]   Seroquel [Quetiapine]     More agitated/confused per daughter [name] when given in the past.   Shellfish Allergy Itching     Prior to Admission medications   Medication Sig Start Date End Date Taking? Authorizing Provider  BISACODYL 5 MG EC tablet TAKE 1 TABLET(5 MG) BY MOUTH DAILY AS NEEDED FOR CONSTIPATION 06/16/21   Carole Civil, MD  cetirizine (ZYRTEC) 10 MG tablet Take 10 mg by mouth daily as needed for allergies.    [provider]   clonazePAM (KLONOPIN) 1 MG tablet Take 1 mg by mouth 3 (three) times daily as needed for anxiety.    [provider]  diclofenac Sodium (VOLTAREN) 1 % GEL Apply 2 g topically  3 (three) times daily as needed (pain).    [provider]  FLUoxetine (PROZAC) 20 MG capsule Take 20 mg by mouth daily. 05/07/21   [provider]  gabapentin (NEURONTIN) 100 MG capsule TAKE 1 CAPSULE(100 MG) BY MOUTH THREE TIMES DAILY 07/01/21   Carole Civil, MD  ibuprofen (ADVIL) 800 MG tablet Take 1 tablet (800 mg total) by mouth every 6 (six) hours as needed for mild pain or moderate pain. 03/20/21   Carole Civil, MD  Ketotifen Fumarate (ALLERGY EYE DROPS OP) Place 1 drop into both eyes daily as needed (allergies).    [provider]  Melatonin 10 MG TABS Take 20 mg by mouth at bedtime as needed (sleep).    [provider]  methocarbamol (ROBAXIN) 500 MG tablet Take 1 tablet (500 mg total) by mouth every 6 (six) hours as needed for muscle spasms. 05/07/21   Carole Civil, MD  pantoprazole (PROTONIX) 40 MG tablet Take 40 mg by mouth daily. 07/13/19   [provider]  rosuvastatin (CRESTOR) 5 MG tablet Take 5 mg by mouth daily.    [provider]  telmisartan-hydrochlorothiazide (MICARDIS HCT) 80-25 MG tablet Take 1 tablet by mouth daily. 09/24/17   Tanda Rockers, MD    ___________________________________________________________________________________________________ Physical Exam:    02/14/2022    6:52 PM 02/14/2022    3:30 PM 02/14/2022    2:30 PM  Vitals with BMI  Systolic [phone]  Diastolic 54 63 58  Pulse 297 100 98     1. General:  in No  Acute distress   Chronically ill   -appearing 2. Psychological: Alert and   Oriented 3. Head/ENT:   Dry Mucous Membranes                          Head Non traumatic, neck supple                           Poor Dentition 4. SKIN:  decreased Skin turgor,  Skin clean Dry and intact no  rash 5. Heart: Regular rate and rhythm no  Murmur, no Rub or gallop 6. Lungs:  , no wheezes some crackles   7. Abdomen: Soft,  non-tender, Non distended   obese  bowel sounds present 8. Lower extremities: no clubbing, cyanosis, no  edema 9. Neurologically Grossly intact, moving all 4 extremities equally   10. MSK: Normal range of motion    Chart has been reviewed  ______________________________________________________________________________________________  Assessment/Plan  61 y.o. female with medical history significant of COPD,? HTN, HLD, diastolic CHF, OSA, obesity   Admitted for   Sepsis  Community acquired pneumonia,    Present on Admission:  Pneumonia  HYPERCHOLESTEROLEMIA  Essential hypertension, benign  Acute on chronic respiratory failure with hypoxemia (HCC)  Hyponatremia  Elevated troponin  Severe sepsis (HCC)  Prolonged QT interval  AKI (acute kidney injury) ([place])  Chronic diastolic CHF (congestive heart failure) (HCC)     Pneumonia  - -Patient presenting with  productive cough, fever    Hypoxia  and infiltrate  on chest x-ray -Infiltrate on CXR and 2-3 characteristics (fever, leukocytosis, purulent sputum) are consistent with pneumonia. -This appears to be most likely community-acquired pneumonia.     will admit for treatment of CAP will start on appropriate antibiotic coverage. -Aztreonam Vanco and metronidazole initially MRSA negative Patient have had ceftriaxone in the past and was ordered cefepime  yesterday which she tolerated.  Will DC vancomycin add doxycycline 9 QT prolongation)    Obtain:  sputum cultures,               Obtain respiratory panel                 influenza serologies negative                  COVID PCR negative                     blood cultures and sputum cultures ordered                   strep pneumo UA antigen,                    check for Legionella antigen.                Provide oxygen as needed.      HYPERCHOLESTEROLEMIA Chronic stable continue Crestor 5 mg p.o. daily  Essential hypertension, benign Soft blood pressures allow permissive hypertension for now  Acute on chronic respiratory failure with hypoxemia (HCC)  this patient has acute respiratory failure with Hypoxia and   as documented by the presence of following: O2 saturatio< 90% on RA  Likely due to:   Pneumonia,  Provide O2 therapy and titrate as needed  Continuous pulse ox  check Pulse ox with ambulation prior to discharge   may need  TC consult for home O2 set up    flutter valve ordered   Hyponatremia In the setting of pneumonia.  Obtain urine electrolytes check TSH Recheck sodium level after rehydration  Elevated troponin In the setting of hypoxia In increased work of breathing. Will obtain echogram monitor on telemetry Repeat to make sure is not rising  Severe sepsis (HCC)    -SIRS criteria met with  elevated white blood cell count,       Component Value Date/Time   WBC 13.9 (H) 02/13/2022 2146   LYMPHSABS 1.2 02/13/2022 2146   tachycardia   , fever RR >20 Today's Vitals   02/14/22 1427 02/14/22 1430 02/14/22 1530 02/14/22 1852  BP:  (!) 105/58 114/63 (!) 116/54  Pulse:  98 100 (!) 111  Resp:  (!) 25 (!) 21 19  Temp: 99.3 F (37.4 C)     TempSrc: Axillary     SpO2:  99% 100%   Weight:      Height:      PainSc:       Body mass index is 39.05 kg/m.  This patient meets SIRS Criteria and may be septic.   Order a lactic acid level if needed AND/OR Initiate the sepsis protocol with the attached order set   The recent clinical data is shown below. Vitals:   02/14/22 1427 02/14/22 1430 02/14/22 1530 02/14/22 1852  BP:  (!) 105/58 114/63 (!) 116/54  Pulse:  98 100 (!) 111  Resp:  (!) 25 (!) 21 19  Temp: 99.3 F (37.4 C)     TempSrc: Axillary     SpO2:  99% 100%   Weight:      Height:         -Most likely source being  Pulmonary   Patient meeting criteria for Severe sepsis with     evidence of end organ damage/organ dysfunction such as   Acute hypoxia requiring new supplemental oxygen, SpO2: 100 % O2 Flow Rate (L/min): (S) 3 L/min (  See note) FiO2 (%): 24 %      - Obtain serial lactic acid and procalcitonin level.  - Initiated IV antibiotics in ER: Antibiotics Given (last 72 hours)     Date/Time Action Medication Dose Rate   02/13/22 2259 New Bag/Given   ceFEPIme (MAXIPIME) 2 g in sodium chloride 0.9 % 100 mL IVPB 2 g 200 mL/hr   02/13/22 2343 New Bag/Given   metroNIDAZOLE (FLAGYL) IVPB 500 mg 500 mg 100 mL/hr   02/14/22 0103 New Bag/Given   vancomycin (VANCOCIN) IVPB 1000 mg/200 mL premix 1,000 mg 200 mL/hr   02/14/22 0327 New Bag/Given   vancomycin (VANCOCIN) 500 mg in sodium chloride 0.9 % 100 mL IVPB 500 mg 100 mL/hr   02/14/22 1008 New Bag/Given   ceFEPIme (MAXIPIME) 2 g in sodium chloride 0.9 % 100 mL IVPB 2 g 200 mL/hr       Will continue  on : cefepime and doxycyline (qt prolongation)   - await results of blood and urine culture  - Rehydrate aggressively  Intravenous fluids were administered    7:29 PM    Prolonged QT interval - will monitor on tele avoid QT prolonging medications, rehydrate correct electrolytes   AKI (acute kidney injury) ([place]) possibly due to dehydration, obtain urine electrolytes but being mindful that pt  has been rehydrated  will repeat labs  Chronic diastolic CHF (congestive heart failure) (HCC) Now fluid up  If BP continue to stay stable may need mild diuresis Repeat echo   Notified by RN that pt has melanotic smell, no BRBPR. Will order hemoccult stool  Hx of anxiety  Hold off on klonopin for now reports increased sedation  Other plan as per orders.  DVT prophylaxis:    scd    Code Status:    Code Status: Prior FULL CODE  as per patient   I had personally discussed CODE STATUS with patient     Family Communication:   Family not at  Bedside    Disposition Plan:        To home once workup is complete  and patient is stable   Following barriers for discharge:                                                            Afebrile, white count improving able to transition to PO antibiotics                             Will need to be able to tolerate PO                        Would benefit from PT/OT eval prior to DC  Ordered                                      Consults called: none    Admission status:  ED Disposition     ED Disposition  [place]: [place] [[id number]]  Level of Care: Progressive [102]  Admit to Progressive based on following criteria: RESPIRATORY PROBLEMS hypoxemic/hypercapnic respiratory failure that is  responsive to NIPPV (BiPAP) or High Flow Nasal Cannula (6-80 lpm). Frequent assessment/intervention, no > Q2 hrs < Q4 hrs, to maintain oxygenation and pulmonary hygiene.  Interfacility transfer: Yes  May place patient in observation at [facility] or [place] if equivalent level of care is available:: No  Covid Evaluation: Asymptomatic - no recent exposure (last 10 days) testing not required  Diagnosis: Pneumonia [227785]  Admitting Physician: Bridgett Larsson, [place]  Attending Physician: Bridgett Larsson, ERIC [[id number]]           Obs      Level of care       progressive tele indefinitely please discontinue once patient no longer qualifies COVID-19 Labs    Lab Results  Component Value Date   [place] 02/13/2022     Precautions: admitted as   Covid Negative     Barth Trella 02/14/2022, 8:39 PM    [facility]     after 2 AM please page floor coverage PA If 7AM-7PM, please contact the day team taking care of the patient using [email or web address]   Patient was evaluated in the context of the global COVID-19 pandemic, which necessitated consideration that the patient might be at risk for infection with the SARS-CoV-2 virus that causes COVID-19. Institutional protocols and algorithms that pertain  to the evaluation of patients at risk for COVID-19 are in a state of rapid change based on information released by regulatory bodies including the CDC and federal and state organizations. These policies and algorithms were followed during the patient's care.

## 2022-02-14 NOTE — ED Notes (Signed)
Patient was placed on 3 L [place] to maintain O2 sats >90%. Patient denies needing O2 at home. Patient O2 sats on room air range 82-87% prior to [place] placement. RN made aware.

## 2022-02-14 NOTE — ED Notes (Signed)
Pt to and from [place] on continuous cardiac monitoring  viia stretcher escorted by this nurse and CT staff

## 2022-02-14 NOTE — Subjective & Objective (Signed)
Has not been feeling well for past 1 week yesterday presented to [facility] with cough somewhat productive some congestion noted to be febrile in triage and hypotensive Patient endorsing chills fatigue and fever shortness of breath and chest tightness noted to be initially cardiac to 114 blood pressure at 109/81 respirations and 28 initially was tolerating room air after few fluid resuscitation with 3 L required O2 due to hypoxia with O2 sats in mid 80s on room air Patient was noted to have pneumonia on CT and hospitalist was called for transfer to [facility]

## 2022-02-14 NOTE — ED Notes (Signed)
Patient got out of bed at this time. Patient removed IV in right hand and CPAP circuit while exiting the bed. Call bell was in reach of patient, but patient did not call for assistance. EMT came to room to assist. RN made aware.

## 2022-02-14 NOTE — ED Notes (Signed)
Pt continues to rest with eyes closed;  symmetrical rise and fall of chest on nasal cannula; O2 sats 100% on 2L  --- pt now titrated from 2L O2 via [place] to 1L O2 via [place] - continuous cardiac and pulse ox maintained.  Metronidazole infusing '@100ml'$ /hr to 20G L AC; dressing dry and intact; site free of complications.   Will monitor for acute changes and maintain plan of care.

## 2022-02-14 NOTE — Assessment & Plan Note (Signed)
possibly due to dehydration, obtain urine electrolytes but being mindful that pt  has been rehydrated  will repeat labs

## 2022-02-14 NOTE — Assessment & Plan Note (Signed)
In the setting of pneumonia.  Obtain urine electrolytes check TSH Recheck sodium level after rehydration

## 2022-02-14 NOTE — ED Notes (Signed)
Pt resting comfortably at this time.

## 2022-02-14 NOTE — Assessment & Plan Note (Signed)
 -  SIRS criteria met with  elevated white blood cell count,       Component Value Date/Time   WBC 13.9 (H) 02/13/2022 2146   LYMPHSABS 1.2 02/13/2022 2146   tachycardia   , fever RR >20 Today's Vitals   02/14/22 1427 02/14/22 1430 02/14/22 1530 02/14/22 1852  BP:  (!) 105/58 114/63 (!) 116/54  Pulse:  98 100 (!) 111  Resp:  (!) 25 (!) 21 19  Temp: 99.3 F (37.4 C)     TempSrc: Axillary     SpO2:  99% 100%   Weight:      Height:      PainSc:       Body mass index is 39.05 kg/m.  This patient meets SIRS Criteria and may be septic.   Order a lactic acid level if needed AND/OR Initiate the sepsis protocol with the attached order set   The recent clinical data is shown below. Vitals:   02/14/22 1427 02/14/22 1430 02/14/22 1530 02/14/22 1852  BP:  (!) 105/58 114/63 (!) 116/54  Pulse:  98 100 (!) 111  Resp:  (!) 25 (!) 21 19  Temp: 99.3 F (37.4 C)     TempSrc: Axillary     SpO2:  99% 100%   Weight:      Height:         -Most likely source being  Pulmonary   Patient meeting criteria for Severe sepsis with    evidence of end organ damage/organ dysfunction such as   Acute hypoxia requiring new supplemental oxygen, SpO2: 100 % O2 Flow Rate (L/min): (S) 3 L/min (See note) FiO2 (%): 24 %      - Obtain serial lactic acid and procalcitonin level.  - Initiated IV antibiotics in ER: Antibiotics Given (last 72 hours)    Date/Time Action Medication Dose Rate   02/13/22 2259 New Bag/Given   ceFEPIme (MAXIPIME) 2 g in sodium chloride 0.9 % 100 mL IVPB 2 g 200 mL/hr   02/13/22 2343 New Bag/Given   metroNIDAZOLE (FLAGYL) IVPB 500 mg 500 mg 100 mL/hr   02/14/22 0103 New Bag/Given   vancomycin (VANCOCIN) IVPB 1000 mg/200 mL premix 1,000 mg 200 mL/hr   02/14/22 0327 New Bag/Given   vancomycin (VANCOCIN) 500 mg in sodium chloride 0.9 % 100 mL IVPB 500 mg 100 mL/hr   02/14/22 1008 New Bag/Given   ceFEPIme (MAXIPIME) 2 g in sodium chloride 0.9 % 100 mL IVPB 2 g 200 mL/hr      Will  continue  on : cefepime and doxycyline (qt prolongation)   - await results of blood and urine culture  - Rehydrate aggressively  Intravenous fluids were administered    7:29 PM

## 2022-02-14 NOTE — ED Notes (Signed)
Pt noted to be moaning and this RN went to speak to pt. Pt firmly requesting to have CPAP removed. Pt educated on reasoning for CPAP requirement. Pt still requesting to take a break from machine. CPAP removed from pt and primary RN made aware.

## 2022-02-14 NOTE — ED Notes (Signed)
RT placed pt on CPAP dream station in auto titrate max 12 min 3 w/1 Lpm bled into the system to maintain sats at >88%. Pt has history of sleep apnea and wears CPAP at home. Unknown home settings. Pt respiratory status stable w/no distress noted. Pt BBS rhonchi/coarse crackles. RT will continue to monitor while in ED.

## 2022-02-14 NOTE — Assessment & Plan Note (Signed)
-   will monitor on tele avoid QT prolonging medications, rehydrate correct electrolytes ? ?

## 2022-02-14 NOTE — ED Notes (Signed)
Patient placed back on CPAP at this time. Patient sleeping with O2 sats 82-84% on RA. Encouraged patient to wear CPAP while resting. Patient is tolerating CPAP at this time.

## 2022-02-14 NOTE — Progress Notes (Signed)
Patient stated that she is congested and is unable to wear CPAP at this time. Patient placed on 5L Nasal Cannula and head of bed elevated. RT will continue to monitor patient.

## 2022-02-14 NOTE — Assessment & Plan Note (Signed)
Soft blood pressures allow permissive hypertension for now

## 2022-02-14 NOTE — Assessment & Plan Note (Signed)
Chronic stable continue Crestor 5 mg p.o. daily

## 2022-02-14 NOTE — Assessment & Plan Note (Addendum)
- -  Patient presenting with  productive cough, fever    Hypoxia  and infiltrate  on chest x-ray -Infiltrate on CXR and 2-3 characteristics (fever, leukocytosis, purulent sputum) are consistent with pneumonia. -This appears to be most likely community-acquired pneumonia.     will admit for treatment of CAP will start on appropriate antibiotic coverage. -Aztreonam Vanco and metronidazole initially MRSA negative Patient have had ceftriaxone in the past and was ordered cefepime yesterday which she tolerated.  Will DC vancomycin add doxycycline 9 QT prolongation)    Obtain:  sputum cultures,               Obtain respiratory panel                 influenza serologies negative                  COVID PCR negative                     blood cultures and sputum cultures ordered                   strep pneumo UA antigen,                    check for Legionella antigen.                Provide oxygen as needed.

## 2022-02-14 NOTE — Assessment & Plan Note (Signed)
In the setting of hypoxia In increased work of breathing. Will obtain echogram monitor on telemetry Repeat to make sure is not rising

## 2022-02-14 NOTE — Assessment & Plan Note (Signed)
Now fluid up  If BP continue to stay stable may need mild diuresis Repeat echo

## 2022-02-14 NOTE — Assessment & Plan Note (Signed)
this patient has acute respiratory failure with Hypoxia and   as documented by the presence of following: O2 saturatio< 90% on RA   Likely due to:   Pneumonia,   Provide O2 therapy and titrate as needed  Continuous pulse ox   check Pulse ox with ambulation prior to discharge   may need  TC consult for home O2 set up    flutter valve ordered  

## 2022-02-14 NOTE — ED Notes (Signed)
All fluid boluses held at this time due to patient now "wet sounding" - O2 desat to 87%.  Providers Dr. Sherry Ruffing and Dr. Karle Starch called to bedside; agreeable with plan to hold fluids and per providers place pt back on suppl O2 via [place] for add'l O2 support.  Pt now on 2L O2 via [place] with continuous cardiac and pulse ox maintained.  Pt resting with eyes closed however easily aroused with verbal stimulation.

## 2022-02-15 ENCOUNTER — Inpatient Hospital Stay (HOSPITAL_COMMUNITY): Payer: 59

## 2022-02-15 ENCOUNTER — Observation Stay (HOSPITAL_COMMUNITY): Payer: 59

## 2022-02-15 DIAGNOSIS — Z20822 Contact with and (suspected) exposure to covid-19: Secondary | ICD-10-CM | POA: Diagnosis not present

## 2022-02-15 DIAGNOSIS — E871 Hypo-osmolality and hyponatremia: Secondary | ICD-10-CM | POA: Diagnosis not present

## 2022-02-15 DIAGNOSIS — Z885 Allergy status to narcotic agent status: Secondary | ICD-10-CM | POA: Diagnosis not present

## 2022-02-15 DIAGNOSIS — Z888 Allergy status to other drugs, medicaments and biological substances status: Secondary | ICD-10-CM | POA: Diagnosis not present

## 2022-02-15 DIAGNOSIS — I110 Hypertensive heart disease with heart failure: Secondary | ICD-10-CM | POA: Diagnosis not present

## 2022-02-15 DIAGNOSIS — J9621 Acute and chronic respiratory failure with hypoxia: Secondary | ICD-10-CM

## 2022-02-15 DIAGNOSIS — E7800 Pure hypercholesterolemia, unspecified: Secondary | ICD-10-CM | POA: Diagnosis not present

## 2022-02-15 DIAGNOSIS — I428 Other cardiomyopathies: Secondary | ICD-10-CM | POA: Diagnosis not present

## 2022-02-15 DIAGNOSIS — I447 Left bundle-branch block, unspecified: Secondary | ICD-10-CM | POA: Diagnosis not present

## 2022-02-15 DIAGNOSIS — F32A Depression, unspecified: Secondary | ICD-10-CM | POA: Diagnosis not present

## 2022-02-15 DIAGNOSIS — R6520 Severe sepsis without septic shock: Secondary | ICD-10-CM | POA: Diagnosis not present

## 2022-02-15 DIAGNOSIS — K219 Gastro-esophageal reflux disease without esophagitis: Secondary | ICD-10-CM | POA: Diagnosis not present

## 2022-02-15 DIAGNOSIS — N179 Acute kidney failure, unspecified: Secondary | ICD-10-CM | POA: Diagnosis not present

## 2022-02-15 DIAGNOSIS — J189 Pneumonia, unspecified organism: Secondary | ICD-10-CM

## 2022-02-15 DIAGNOSIS — I071 Rheumatic tricuspid insufficiency: Secondary | ICD-10-CM | POA: Diagnosis not present

## 2022-02-15 DIAGNOSIS — E039 Hypothyroidism, unspecified: Secondary | ICD-10-CM | POA: Diagnosis present

## 2022-02-15 DIAGNOSIS — E669 Obesity, unspecified: Secondary | ICD-10-CM | POA: Diagnosis not present

## 2022-02-15 DIAGNOSIS — A419 Sepsis, unspecified organism: Secondary | ICD-10-CM | POA: Diagnosis not present

## 2022-02-15 DIAGNOSIS — J9601 Acute respiratory failure with hypoxia: Secondary | ICD-10-CM | POA: Diagnosis not present

## 2022-02-15 DIAGNOSIS — J440 Chronic obstructive pulmonary disease with acute lower respiratory infection: Secondary | ICD-10-CM | POA: Diagnosis not present

## 2022-02-15 DIAGNOSIS — R0602 Shortness of breath: Secondary | ICD-10-CM | POA: Diagnosis not present

## 2022-02-15 DIAGNOSIS — I5032 Chronic diastolic (congestive) heart failure: Secondary | ICD-10-CM | POA: Diagnosis not present

## 2022-02-15 DIAGNOSIS — I5021 Acute systolic (congestive) heart failure: Secondary | ICD-10-CM | POA: Diagnosis not present

## 2022-02-15 DIAGNOSIS — G4733 Obstructive sleep apnea (adult) (pediatric): Secondary | ICD-10-CM | POA: Diagnosis not present

## 2022-02-15 DIAGNOSIS — R931 Abnormal findings on diagnostic imaging of heart and coronary circulation: Secondary | ICD-10-CM | POA: Diagnosis not present

## 2022-02-15 DIAGNOSIS — I700 Atherosclerosis of aorta: Secondary | ICD-10-CM | POA: Diagnosis not present

## 2022-02-15 DIAGNOSIS — R778 Other specified abnormalities of plasma proteins: Secondary | ICD-10-CM | POA: Diagnosis not present

## 2022-02-15 DIAGNOSIS — I2510 Atherosclerotic heart disease of native coronary artery without angina pectoris: Secondary | ICD-10-CM | POA: Diagnosis not present

## 2022-02-15 DIAGNOSIS — F419 Anxiety disorder, unspecified: Secondary | ICD-10-CM | POA: Diagnosis not present

## 2022-02-15 DIAGNOSIS — E0590 Thyrotoxicosis, unspecified without thyrotoxic crisis or storm: Secondary | ICD-10-CM | POA: Diagnosis not present

## 2022-02-15 LAB — COMPREHENSIVE METABOLIC PANEL
ALT: 21 U/L (ref 0–44)
AST: 34 U/L (ref 15–41)
Albumin: 2.8 g/dL — ABNORMAL LOW (ref 3.5–5.0)
Alkaline Phosphatase: 97 U/L (ref 38–126)
Anion gap: 10 (ref 5–15)
BUN: 18 mg/dL (ref 6–20)
CO2: 25 mmol/L (ref 22–32)
Calcium: 8.9 mg/dL (ref 8.9–10.3)
Chloride: 101 mmol/L (ref 98–111)
Creatinine, Ser: 0.65 mg/dL (ref 0.44–1.00)
GFR, Estimated: >60 mL/min (ref >60)
Glucose, Bld: 110 mg/dL — ABNORMAL HIGH (ref 70–99)
Potassium: 4 mmol/L (ref 3.5–5.1)
Sodium: 136 mmol/L (ref 135–145)
Total Bilirubin: 0.9 mg/dL (ref 0.3–1.2)
Total Protein: 7.1 g/dL (ref 6.5–8.1)

## 2022-02-15 LAB — RESPIRATORY PANEL BY PCR

## 2022-02-15 LAB — EXPECTORATED SPUTUM ASSESSMENT W GRAM STAIN, RFLX TO RESP C

## 2022-02-15 LAB — CBC
HCT: 33.7 % — ABNORMAL LOW (ref 36.0–46.0)
Hemoglobin: 10.9 g/dL — ABNORMAL LOW (ref 12.0–15.0)
MCH: 28.1 pg (ref 26.0–34.0)
MCHC: 32.3 g/dL (ref 30.0–36.0)
MCV: 86.9 fL (ref 80.0–100.0)
Platelets: 345 10*3/uL (ref 150–400)
RBC: 3.88 MIL/uL (ref 3.87–5.11)
RDW: 15.2 % (ref 11.5–15.5)
WBC: 10 10*3/uL (ref 4.0–10.5)
nRBC: 0 % (ref 0.0–0.2)

## 2022-02-15 LAB — ECHOCARDIOGRAM COMPLETE
AR max vel: 2.29 cm2
AV Area VTI: 2.19 cm2
AV Area mean vel: 1.83 cm2
AV Mean grad: 5 mmHg
AV Peak grad: 9.1 mmHg
Ao pk vel: 1.51 m/s
Area-P 1/2: 3.65 cm2
Calc EF: 51.7 %
Height: 60 in
MV VTI: 1.88 cm2
S' Lateral: 3.3 cm
Single Plane A2C EF: 41.4 %
Single Plane A4C EF: 55.9 %
Weight: 3199.32 oz

## 2022-02-15 LAB — URINE CULTURE: Culture: <10000 — AB

## 2022-02-15 LAB — TSH: TSH: 0.105 u[IU]/mL — ABNORMAL LOW (ref 0.350–4.500)

## 2022-02-15 LAB — C-REACTIVE PROTEIN: CRP: 32.7 mg/dL — ABNORMAL HIGH (ref <1.0)

## 2022-02-15 MED ORDER — ASPIRIN 81 MG PO CHEW
81.0000 mg | CHEWABLE_TABLET | Freq: Every day | ORAL | Status: DC
  Administered 2022-02-15 – 2022-02-18 (×4): 81 mg via ORAL
  Filled 2022-02-15 (×4): qty 1

## 2022-02-15 MED ORDER — PROPRANOLOL HCL 10 MG PO TABS: 10.0000 mg | ORAL_TABLET | Freq: Two times a day (BID) | ORAL | Status: DC

## 2022-02-15 MED ORDER — ENOXAPARIN SODIUM 60 MG/0.6ML IJ SOSY
50.0000 mg | PREFILLED_SYRINGE | INTRAMUSCULAR | Status: DC
  Administered 2022-02-15 – 2022-02-18 (×4): 50 mg via SUBCUTANEOUS
  Filled 2022-02-15 (×4): qty 0.6

## 2022-02-15 MED ORDER — METOPROLOL TARTRATE 50 MG PO TABS
50.0000 mg | ORAL_TABLET | Freq: Two times a day (BID) | ORAL | Status: DC
  Administered 2022-02-15 – 2022-02-18 (×7): 50 mg via ORAL
  Filled 2022-02-15 (×7): qty 1

## 2022-02-15 MED ORDER — IPRATROPIUM BROMIDE 0.02 % IN SOLN
0.5000 mg | Freq: Two times a day (BID) | RESPIRATORY_TRACT | Status: DC
Start: 2022-02-16 — End: 2022-02-20
  Administered 2022-02-16 – 2022-02-19 (×7): 0.5 mg via RESPIRATORY_TRACT
  Filled 2022-02-15 (×7): qty 2.5

## 2022-02-15 MED ORDER — DILTIAZEM HCL 25 MG/5ML IV SOLN: 10.0000 mg | Freq: Four times a day (QID) | INTRAVENOUS | Status: DC | PRN

## 2022-02-15 MED ORDER — LEVALBUTEROL HCL 0.63 MG/3ML IN NEBU
0.6300 mg | INHALATION_SOLUTION | Freq: Two times a day (BID) | RESPIRATORY_TRACT | Status: DC
  Administered 2022-02-16 – 2022-02-18 (×6): 0.63 mg via RESPIRATORY_TRACT
  Filled 2022-02-15 (×7): qty 3

## 2022-02-15 MED ORDER — METHIMAZOLE 5 MG PO TABS
5.0000 mg | ORAL_TABLET | Freq: Two times a day (BID) | ORAL | Status: DC
  Administered 2022-02-15 – 2022-02-18 (×8): 5 mg via ORAL
  Filled 2022-02-15 (×9): qty 1

## 2022-02-15 NOTE — Evaluation (Signed)
Physical Therapy Evaluation Patient Details Name: Rachel [name] MRN: [id number] DOB: 1960-08-23 Today's Date: 02/15/2022  History of Present Illness  61 y.o. F admitted on 02/15/22 due to SoB, fatigue, and cough. Pt found to have Pneumonia. PMH significant for COPD, HTN, HLD, CHF, OSA.  Clinical Impression   Pt admitted with above diagnosis. Lives at home alone, in a single-level home with an elevator to access her place; Prior to admission, pt was managing independently, driving, working; Therapist, sports to PT with decr functional capacity, tendency to fatigue with walking incr distances, but overall walking well, with no apparent balance deficits;  Walked on room air and O2 sats ranged 87% to 96%;   Pt currently with functional limitations due to the deficits listed below (see PT Problem List). Pt will benefit from skilled PT to increase their independence and safety with mobility to allow discharge to the venue listed below.       SATURATION QUALIFICATIONS: (This note is used to comply with regulatory documentation for home oxygen)  Patient Saturations on Room Air at Rest = 94%  Patient Saturations on Room Air while Ambulating = 87% to 96%  Did not require supplemental O2 to bring O2 sats back into the 90s  Would like to repeat the O2 sat walk next session to show consistency.     Recommendations for follow up therapy are one component of a multi-disciplinary discharge planning process, led by the attending physician.  Recommendations may be updated based on patient status, additional functional criteria and insurance authorization.  Follow Up Recommendations No PT follow up      Assistance Recommended at Discharge PRN  Patient can return home with the following       Equipment Recommendations None recommended by PT  Recommendations for Other Services       Functional Status Assessment Patient has had a recent decline in their functional status and demonstrates the ability to make  significant improvements in function in a reasonable and predictable amount of time.     Precautions / Restrictions Precautions Precaution Comments: watch O2 sats      Mobility  Bed Mobility               General bed mobility comments: sitting EOB    Transfers Overall transfer level: Modified independent Equipment used: None               General transfer comment: No assist    Ambulation/Gait Ambulation/Gait assistance: Min guard, Supervision Gait Distance (Feet): 85 Feet Assistive device: None Gait Pattern/deviations: Step-through pattern (approaching WNL) Gait velocity: slightly slow     General Gait Details: Minguard progressing to supervision; walked into bathroom, took care of ADL/hygeine, then walked the hallway; fatigue at end of walk  Stairs            Wheelchair Mobility    Modified Rankin (Stroke Patients Only)       Balance Overall balance assessment: No apparent balance deficits (not formally assessed)                                           Pertinent Vitals/Pain Pain Assessment Pain Assessment: No/denies pain    Home Living Family/patient expects to be discharged to:: Private residence Living Arrangements: Alone Available Help at Discharge: Family;Available PRN/intermittently Type of Home: Apartment Home Access: Elevator       Home Layout: One level  Home Equipment: None      Prior Function Prior Level of Function : Independent/Modified Independent;Working/employed;Driving             Mobility Comments: No AD ADLs Comments: Works at [employer], 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, newspaper   Dominant Hand: Right    Extremity/Trunk Assessment   Upper Extremity Assessment Upper Extremity Assessment: Defer to OT evaluation    Lower Extremity Assessment Lower Extremity Assessment: Generalized weakness    Cervical / Trunk Assessment Cervical / Trunk Assessment: Normal  Communication    Communication: No difficulties  Cognition Arousal/Alertness: Awake/alert Behavior During Therapy: WFL for tasks assessed/performed Overall Cognitive Status: Within Functional Limits for tasks assessed                                          General Comments General comments (skin integrity, edema, etc.): walked on room air and O2 sats decreased as low as 87% lowest observed, but O2 sats recovered without the need for supplemental O2    Exercises     Assessment/Plan    PT Assessment Patient needs continued PT services  PT Problem List Cardiopulmonary status limiting activity;Decreased activity tolerance       PT Treatment Interventions DME instruction;Gait training;Functional mobility training;Therapeutic activities;Therapeutic exercise;Patient/family education    PT Goals (Current goals can be found in the Care Plan section)  Acute Rehab PT Goals Patient Stated Goal: Hopes to get home tomorrow PT Goal Formulation: With patient Time For Goal Achievement: 02/22/22 Potential to Achieve Goals: Good    Frequency Min 3X/week     Co-evaluation               AM-PAC PT "6 Clicks" Mobility  Outcome Measure Help needed turning from your back to your side while in a flat bed without using bedrails?: None Help needed moving from lying on your back to sitting on the side of a flat bed without using bedrails?: None Help needed moving to and from a bed to a chair (including a wheelchair)?: None Help needed standing up from a chair using your arms (e.g., wheelchair or bedside chair)?: None Help needed to walk in hospital room?: None Help needed climbing 3-5 steps with a railing? : A Little 6 Click Score: 23    End of Session Equipment Utilized During Treatment: Gait belt;Oxygen Activity Tolerance: Patient tolerated treatment well Patient left: in chair;with call bell/phone within reach Nurse Communication: Mobility status PT Visit Diagnosis: Other  abnormalities of gait and mobility (R26.89) (decr functional capacity)    Time: 1259-1330 PT Time Calculation (min) (ACUTE ONLY): 31 min   Charges:   PT Evaluation $PT Eval Low Complexity: 1 Low PT Treatments $Gait Training: 8-22 mins        Roney Marion, PT  Acute Rehabilitation Services Office [phone]   [place]ta [place] 02/15/2022, 6:11 PM

## 2022-02-15 NOTE — Evaluation (Signed)
Occupational Therapy Evaluation Patient Details Name: Rachel [name] MRN: [id number] DOB: Aug 01, 1960 Today's Date: 02/15/2022   History of Present Illness 61 y.o. F admitted on 02/15/22 due to SoB, fatigue, and cough. Pt found to have Pneumonia. PMH significant for COPD, HTN, HLD, CHF, OSA.   Clinical Impression   Pt admitted for concerns listed above. PTA pt reported that she was independent with all ADL's and IADL's, including working. At this time, pt presents with continued independence, only limited by O2 needs. She has no further skilled OT needs and acute OT will sign off.       Recommendations for follow up therapy are one component of a multi-disciplinary discharge planning process, led by the attending physician.  Recommendations may be updated based on patient status, additional functional criteria and insurance authorization.   Follow Up Recommendations  No OT follow up    Assistance Recommended at Discharge PRN  Patient can return home with the following      Functional Status Assessment  Patient has had a recent decline in their functional status and demonstrates the ability to make significant improvements in function in a reasonable and predictable amount of time.  Equipment Recommendations  None recommended by OT    Recommendations for Other Services       Precautions / Restrictions Precautions Precautions: None Restrictions Weight Bearing Restrictions: No      Mobility Bed Mobility               General bed mobility comments: sitting EOB    Transfers Overall transfer level: Modified independent Equipment used: None               General transfer comment: No assist      Balance Overall balance assessment: No apparent balance deficits (not formally assessed)                                         ADL either performed or assessed with clinical judgement   ADL Overall ADL's : At baseline;Modified independent                                        General ADL Comments: No assist needed     Vision Baseline Vision/History: 0 No visual deficits Ability to See in Adequate Light: 0 Adequate Patient Visual Report: No change from baseline Vision Assessment?: No apparent visual deficits     Perception     Praxis      Pertinent Vitals/Pain Pain Assessment Pain Assessment: No/denies pain     Hand Dominance Right   Extremity/Trunk Assessment Upper Extremity Assessment Upper Extremity Assessment: Overall WFL for tasks assessed   Lower Extremity Assessment Lower Extremity Assessment: Overall WFL for tasks assessed   Cervical / Trunk Assessment Cervical / Trunk Assessment: Normal   Communication Communication Communication: No difficulties   Cognition Arousal/Alertness: Awake/alert Behavior During Therapy: WFL for tasks assessed/performed Overall Cognitive Status: Within Functional Limits for tasks assessed                                       General Comments  VSS on 5L    Exercises     Shoulder Instructions      Home  Living Family/patient expects to be discharged to:: Private residence Living Arrangements: Alone Available Help at Discharge: Family;Available PRN/intermittently Type of Home: Apartment Home Access: Elevator     Home Layout: One level     Bathroom Shower/Tub: Teacher, early years/pre: Standard     Home Equipment: None          Prior Functioning/Environment Prior Level of Function : Independent/Modified Independent;Working/employed;Driving             Mobility Comments: No AD ADLs Comments: Works at [employer], will be switching jobs soon        OT Problem List: Cardiopulmonary status limiting activity      OT Treatment/Interventions:      OT Goals(Current goals can be found in the care plan section) Acute Rehab OT Goals Patient Stated Goal: To go home OT Goal Formulation: With patient Time For Goal  Achievement: 02/15/22 Potential to Achieve Goals: Good  OT Frequency:      Co-evaluation              AM-PAC OT "6 Clicks" Daily Activity     Outcome Measure Help from another person eating meals?: None Help from another person taking care of personal grooming?: None Help from another person toileting, which includes using toliet, bedpan, or urinal?: None Help from another person bathing (including washing, rinsing, drying)?: None Help from another person to put on and taking off regular upper body clothing?: None Help from another person to put on and taking off regular lower body clothing?: None 6 Click Score: 24   End of Session Equipment Utilized During Treatment: Oxygen Nurse Communication: Mobility status  Activity Tolerance: Patient tolerated treatment well Patient left: Other (comment) (Up with PT)  OT Visit Diagnosis: Unsteadiness on feet (R26.81);Other abnormalities of gait and mobility (R26.89);Muscle weakness (generalized) (M62.81)                Time: 1287-8676 OT Time Calculation (min): 14 min Charges:  OT General Charges $OT Visit: 1 Visit OT Evaluation $OT Eval Low Complexity: [place]., OTR/L Acute Rehabilitation  Addy Mcmannis Elane Yolanda Bonine 02/15/2022, 2:27 PM

## 2022-02-15 NOTE — Assessment & Plan Note (Signed)
Could be contributing to tachycardia Will need further work up  And endocrinology follow up

## 2022-02-15 NOTE — Progress Notes (Signed)
Pt stated that she does not want to wear CPAP. RT explained why pt should wear CPAP, Pt still refused.

## 2022-02-15 NOTE — Progress Notes (Signed)
PROGRESS NOTE                                                                                                                                                                                                             Patient Demographics:    Rachel [name], is a 61 y.o. female, DOB - 1960/08/07, BDZ:[id number]  Outpatient Primary MD for the patient is Redmond School, MD    LOS - 0  Admit date - 02/13/2022    Chief Complaint  Patient presents with   Cough   Atrial Fibrillation       Brief Narrative (HPI from H&P)   61 y.o. female with medical history significant of COPD,? HTN, HLD, diastolic CHF, OSA, obesity who presented to the hospital with cough, shortness of breath, excessive fatigue over the last 1 week.  Work-up in the ER consistent with pneumonia and she was admitted for further care.   Subjective:    Rachel [name] today has, No headache, No chest pain, No abdominal pain - No Nausea, No new weakness tingling or numbness, improved cough and shortness of breath.   Assessment  & Plan :    Acute hypoxic respiratory failure, sepsis due to community-acquired pneumonia.  She has been appropriately placed on IV fluids, IV antibiotics, sepsis pathophysiology is resolving, continue to follow cultures continue supportive care, encouraged to sit up in chair use I-S and flutter valve in daytime for pulmonary toiletry.   Hyperthyroidism.  Placed on methimazole.   HYPERCHOLESTEROLEMIA - Chronic stable continue Crestor 5 mg p.o. daily   Essential hypertension, benign -blood pressure improved placed on beta-blocker.   Hyponatremia -due to dehydration resolved with IV fluids.   Elevated troponin - due to demand mismatch from hypoxia, trend is flat and in non-ACS pattern, continue to monitor.  Placed on aspirin, check echocardiogram to evaluate wall motion.  Already on beta-blocker and statin for secondary prevention.   Chest pain-free.         Condition - Extremely Guarded  Family Communication  :  None presnt  Code Status :  Full  Consults  :  Non  PUD Prophylaxis :    Procedures  :     CT - 1. Right lower lobe pneumonia. Clinical correlation and follow-up to resolution recommended. 2. Aortic Atherosclerosis       Disposition Plan  :  Status is: Inpatient  DVT Prophylaxis  :    Place and maintain sequential compression device Start: 02/14/22 2038    Lab Results  Component Value Date   PLT 358 02/14/2022    Diet :  Diet Order             Diet Heart Room service appropriate? Yes; Fluid consistency: Thin  Diet effective now                    Inpatient Medications  Scheduled Meds:  guaiFENesin  600 mg Oral BID   ipratropium  0.5 mg Nebulization TID   levalbuterol  0.63 mg Nebulization TID   methimazole  5 mg Oral BID   metoprolol tartrate  50 mg Oral BID   sodium chloride flush  3 mL Intravenous Q12H   Continuous Infusions:  sodium chloride     ceFEPime (MAXIPIME) IV 2 g (02/14/22 2336)   doxycycline (VIBRAMYCIN) IV 100 mg (02/15/22 0839)   PRN Meds:.sodium chloride, acetaminophen **OR** acetaminophen, albuterol, diltiazem, sodium chloride flush  Time Spent in minutes  30   Lala Lund M.D on 02/15/2022 at 10:20 AM  To page go to [email or web address]   [facility] -  Office  [phone]  See all Orders from today for further details    Objective:   Vitals:   02/15/22 0018 02/15/22 0447 02/15/22 0824 02/15/22 0859  BP: 113/63 132/62  117/63  Pulse: (!) 104 (!) 111  (!) 109  Resp: '20 20  19  '$ Temp: 98.2 F (36.8 C) 100.1 F (37.8 C)    TempSrc: Oral Oral  Oral  SpO2: 100% 100% 98% 100%  Weight:      Height:        Wt Readings from Last 3 Encounters:  02/13/22 90.7 kg  03/13/21 90.7 kg  03/10/21 90.7 kg     Intake/Output Summary (Last 24 hours) at 02/15/2022 1020 Last data filed at 02/15/2022 0859 Gross per 24 hour  Intake 750 ml   Output 300 ml  Net 450 ml     Physical Exam  Awake Alert, No new F.N deficits, Normal affect [place].AT,PERRAL Supple Neck, No JVD,   Symmetrical Chest wall movement, Good air movement bilaterally, few rales Rapid RRR,No Gallops,Rubs or new Murmurs,  +ve B.Sounds, Abd Soft, No tenderness,   No Cyanosis, Clubbing or edema      Data Review:    CBC Recent Labs  Lab 02/13/22 2146 02/14/22 1956  WBC 13.9* 10.8*  HGB 12.1 11.6*  HCT 37.0 36.4  PLT 391 358  MCV 84.5 87.3  MCH 27.6 27.8  MCHC 32.7 31.9  RDW 14.8 14.9  LYMPHSABS 1.2 0.8  MONOABS 1.1* 1.0  EOSABS 0.0 0.1  BASOSABS 0.1 0.0    Electrolytes Recent Labs  Lab 02/13/22 2146 02/13/22 2316 02/13/22 2352 02/14/22 0017 02/14/22 1956 02/14/22 2213  NA 130*  --   --   --  135  --   K 3.9  --   --   --  3.8  --   CL 93*  --   --   --  101  --   CO2 24  --   --   --  24  --   GLUCOSE 118*  --   --   --  120*  --   BUN 22*  --   --   --  20  --   CREATININE 1.24*  --   --   --  0.81  --  CALCIUM 9.5  --   --   --  9.0  --   AST 20  --   --   --  28  --   ALT 13  --   --   --  18  --   ALKPHOS 91  --   --   --  96  --   BILITOT 1.2  --   --   --  1.3*  --   ALBUMIN 4.1  --   --   --  3.2*  --   MG  --  2.0  --   --  2.3  --   PROCALCITON  --   --   --   --  1.03  --   LATICACIDVEN 1.3  --  1.2  --  1.7 0.9  TSH  --   --   --   --  0.127*  --   BNP  --   --   --  87.5  --   --     ------------------------------------------------------------------------------------------------------------------ No results for input(s): "CHOL", "HDL", "LDLCALC", "TRIG", "CHOLHDL", "LDLDIRECT" in the last 72 hours.  No results found for: "HGBA1C"  Recent Labs    02/14/22 1956  TSH 0.127*   ------------------------------------------------------------------------------------------------------------------ ID Labs Recent Labs  Lab 02/13/22 2146 02/13/22 2352 02/14/22 1956 02/14/22 2213  WBC 13.9*  --  10.8*  --   PLT  391  --  358  --   PROCALCITON  --   --  1.03  --   LATICACIDVEN 1.3 1.2 1.7 0.9  CREATININE 1.24*  --  0.81  --     Radiology Reports DG CHEST PORT 1 VIEW  Result Date: 02/14/2022 CLINICAL DATA:  Shortness of breath and cough. EXAM: PORTABLE CHEST 1 VIEW COMPARISON:  Chest radiograph dated 02/13/2022 and CT dated 02/15/2019. FINDINGS: Shallow inspiration. There is mild cardiomegaly with mild vascular congestion. Bibasilar atelectasis. Right lung base opacity seen on the earlier CT is suboptimally visualized on radiograph. No pleural effusion pneumothorax. Atherosclerotic calcification of the aortic arch. No acute osseous pathology. IMPRESSION: Mild cardiomegaly with mild vascular congestion. Electronically Signed   By: Anner Crete M.D.   On: 02/14/2022 19:54   CT Chest Wo Contrast  Result Date: 02/14/2022 CLINICAL DATA:  Pneumonia. EXAM: CT CHEST WITHOUT CONTRAST TECHNIQUE: Multidetector CT imaging of the chest was performed following the standard protocol without IV contrast. RADIATION DOSE REDUCTION: This exam was performed according to the departmental dose-optimization program which includes automated exposure control, adjustment of the mA and/or kV according to patient size and/or use of iterative reconstruction technique. COMPARISON:  Chest radiograph dated 02/13/2022. FINDINGS: Evaluation of this exam is limited in the absence of intravenous contrast as well as due to respiratory motion. Cardiovascular: Top-normal cardiac size. No pericardial effusion. Three-vessel coronary vascular calcification. Mild atherosclerotic calcification of the thoracic aorta. No aneurysmal dilatation. The central pulmonary arteries are grossly unremarkable. Mediastinum/Nodes: No obvious hilar or mediastinal adenopathy. The esophagus and thyroid gland are grossly unremarkable. No mediastinal fluid collection. Lungs/Pleura: There is an area of consolidation in the right lower lobe most consistent with pneumonia.  Clinical correlation and follow-up to resolution recommended. Linear atelectasis or scarring in the lingula. There is no pleural effusion or pneumothorax. The central airways are patent. Upper Abdomen: No acute abnormality. Musculoskeletal: Degenerative changes of the spine. No acute osseous pathology. IMPRESSION: 1. Right lower lobe pneumonia. Clinical correlation and follow-up to resolution recommended. 2. Aortic Atherosclerosis (ICD10-I70.0). Electronically Signed   By:  Anner Crete M.D.   On: 02/14/2022 00:29   DG Chest Port 1 View  Result Date: 02/13/2022 CLINICAL DATA:  Cough. EXAM: PORTABLE CHEST 1 VIEW COMPARISON:  December 20, 2021 FINDINGS: The heart size and mediastinal contours are within normal limits. There is moderate severity calcification of the aortic arch. Low lung volumes are noted. Mild areas of linear scarring and/or atelectasis are seen within the bilateral lung bases. There is no evidence of an acute infiltrate, pleural effusion or pneumothorax. The visualized skeletal structures are unremarkable. IMPRESSION: Mild bibasilar linear scarring and/or atelectasis. Electronically Signed   By: Virgina Norfolk M.D.   On: 02/13/2022 23:17

## 2022-02-15 NOTE — Progress Notes (Signed)
SLP Cancellation Note  Patient Details Name: IPEK WESTRA MRN: [id number] DOB: 08/22/1960   Cancelled treatment:       Reason Eval/Treat Not Completed: Patient at procedure or test/unavailable; having echo when attempted BSE; ST will continue efforts as able.     Elvina Sidle, M.S., [place] 02/15/2022, 1:45 PM

## 2022-02-16 DIAGNOSIS — J9621 Acute and chronic respiratory failure with hypoxia: Secondary | ICD-10-CM | POA: Diagnosis not present

## 2022-02-16 DIAGNOSIS — J189 Pneumonia, unspecified organism: Secondary | ICD-10-CM | POA: Diagnosis not present

## 2022-02-16 DIAGNOSIS — I5032 Chronic diastolic (congestive) heart failure: Secondary | ICD-10-CM | POA: Diagnosis not present

## 2022-02-16 LAB — COMPREHENSIVE METABOLIC PANEL
ALT: 23 U/L (ref 0–44)
AST: 36 U/L (ref 15–41)
Albumin: 2.7 g/dL — ABNORMAL LOW (ref 3.5–5.0)
Alkaline Phosphatase: 95 U/L (ref 38–126)
Anion gap: 7 (ref 5–15)
BUN: 19 mg/dL (ref 6–20)
CO2: 28 mmol/L (ref 22–32)
Calcium: 8.8 mg/dL — ABNORMAL LOW (ref 8.9–10.3)
Chloride: 102 mmol/L (ref 98–111)
Creatinine, Ser: 0.71 mg/dL (ref 0.44–1.00)
GFR, Estimated: >60 mL/min (ref >60)
Glucose, Bld: 115 mg/dL — ABNORMAL HIGH (ref 70–99)
Potassium: 3.6 mmol/L (ref 3.5–5.1)
Sodium: 137 mmol/L (ref 135–145)
Total Bilirubin: 0.5 mg/dL (ref 0.3–1.2)
Total Protein: 7.1 g/dL (ref 6.5–8.1)

## 2022-02-16 LAB — C-REACTIVE PROTEIN: CRP: 27.4 mg/dL — ABNORMAL HIGH (ref <1.0)

## 2022-02-16 LAB — CBC WITH DIFFERENTIAL/PLATELET
Abs Immature Granulocytes: 0.02 10*3/uL (ref 0.00–0.07)
Basophils Absolute: 0 10*3/uL (ref 0.0–0.1)
Basophils Relative: 0 %
Eosinophils Absolute: 0.1 10*3/uL (ref 0.0–0.5)
Eosinophils Relative: 2 %
HCT: 33.8 % — ABNORMAL LOW (ref 36.0–46.0)
Hemoglobin: 10.5 g/dL — ABNORMAL LOW (ref 12.0–15.0)
Immature Granulocytes: 0 %
Lymphocytes Relative: 20 %
Lymphs Abs: 1.3 10*3/uL (ref 0.7–4.0)
MCH: 27.4 pg (ref 26.0–34.0)
MCHC: 31.1 g/dL (ref 30.0–36.0)
MCV: 88.3 fL (ref 80.0–100.0)
Monocytes Absolute: 0.9 10*3/uL (ref 0.1–1.0)
Monocytes Relative: 13 %
Neutro Abs: 4.2 10*3/uL (ref 1.7–7.7)
Neutrophils Relative %: 65 %
Platelets: 361 10*3/uL (ref 150–400)
RBC: 3.83 MIL/uL — ABNORMAL LOW (ref 3.87–5.11)
RDW: 15.3 % (ref 11.5–15.5)
WBC: 6.6 10*3/uL (ref 4.0–10.5)
nRBC: 0 % (ref 0.0–0.2)

## 2022-02-16 LAB — T3: T3, Total: 85 ng/dL (ref 71–180)

## 2022-02-16 LAB — MAGNESIUM: Magnesium: 2.2 mg/dL (ref 1.7–2.4)

## 2022-02-16 LAB — BRAIN NATRIURETIC PEPTIDE: B Natriuretic Peptide: 71.5 pg/mL (ref 0.0–100.0)

## 2022-02-16 LAB — PROCALCITONIN: Procalcitonin: 0.5 ng/mL

## 2022-02-16 MED ORDER — SODIUM CHLORIDE 0.9 % IV SOLN
2.0000 g | Freq: Three times a day (TID) | INTRAVENOUS | Status: DC
  Administered 2022-02-16 – 2022-02-19 (×10): 2 g via INTRAVENOUS
  Filled 2022-02-16 (×10): qty 12.5

## 2022-02-16 MED ORDER — POTASSIUM CHLORIDE CRYS ER 20 MEQ PO TBCR
40.0000 meq | EXTENDED_RELEASE_TABLET | Freq: Once | ORAL | Status: AC
  Administered 2022-02-16: 40 meq via ORAL
  Filled 2022-02-16: qty 2

## 2022-02-16 NOTE — Evaluation (Signed)
Clinical/Bedside Swallow Evaluation Patient Details  Name: Rachel [name] MRN: [id number] Date of Birth: Oct 07, 1960  Today's Date: 02/16/2022 Time: SLP Start Time (ACUTE ONLY): 1138 SLP Stop Time (ACUTE ONLY): 1201 SLP Time Calculation (min) (ACUTE ONLY): 23 min  Past Medical History:  Past Medical History:  Diagnosis Date   Anemia    Anxiety    Arthritis    in both knees   Depression    GERD (gastroesophageal reflux disease)    Hyperlipidemia    Hypertension    Sleep apnea    Past Surgical History:  Past Surgical History:  Procedure Laterality Date   CARPAL TUNNEL RELEASE Right    CATARACT EXTRACTION W/PHACO Right 02/19/2020   Procedure: CATARACT EXTRACTION PHACO AND INTRAOCULAR LENS PLACEMENT ([place]);  Surgeon: Baruch Goldmann, MD;  Location: [facility];  Service: Ophthalmology;  Laterality: Right;  CDE: 4.83   CESAREAN SECTION     cesarian  1990   COLONOSCOPY N/A 08/10/2016   Procedure: COLONOSCOPY;  Surgeon: Danie Binder, MD;  Location: [facility];  Service: Endoscopy;  Laterality: N/A;  10:30 AM   fallopian tube removed N/A    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 F presenting on 02/13/22 due to SoB, fatigue, and cough. Pt found to have PNA. Pt did pass a [facility] swallow screen 02/14/22. PMH: significant for COPD, GERD, HTN, HLD, CHF, OSA. Pt seen by pulmonology in 2019 for respiratory symptoms felt to be related to GERD.    Assessment / Plan / Recommendation  Clinical Impression  Pt's oropharyngeal swallow appears to be [facility], but she does report symptoms of reflux that she does believes to be not well controlled. She says that she often coughs up sputum and thought that her symptoms of PNA were just a flare up of her normal reflux symptoms. She was seen by a pulmonologist several years ago who had thought that her respiratory  symptoms were secondary to GERD and had prescribed her medicine, but she only took it for 6 months because she thought it had lost its effectiveness. Esophageal precautions were reviewed with pt, and it appears as though she knows most of these strategies but that she finds them hard to follow. SLP provided education and cues to help brainstorm ways to try to increase use. No further acute needs identified from SLP; however, MD may wish to consider a more dedicated w/u and/or more management of her GERD, as risk for aspiration appears to be greatest post-prandially. SLP Visit Diagnosis: Dysphagia, unspecified (R13.10)    Aspiration Risk  Mild aspiration risk    Diet Recommendation Regular;Thin liquid   Liquid Administration via: Cup;Straw Medication Administration: Whole meds with liquid Supervision: Patient able to self feed;Intermittent supervision to cue for compensatory strategies Compensations: Slow rate;Small sips/bites;Follow solids with liquid Postural Changes: Remain upright for at least 30 minutes after po intake;Seated upright at 90 degrees    Other  Recommendations Recommended Consults: Consider GI evaluation Oral Care Recommendations: Oral care BID    Recommendations for follow up therapy are one component of a multi-disciplinary discharge planning process, led by the attending physician.  Recommendations may be updated based on patient status, additional functional criteria and insurance authorization.  Follow up Recommendations No SLP follow up      Assistance Recommended at Discharge PRN  Functional Status Assessment Patient has not had  a recent decline in their functional status  Frequency and Duration            Prognosis Prognosis for Safe Diet Advancement: Good      Swallow Study   General HPI: 61 y.o. F presenting on 02/13/22 due to SoB, fatigue, and cough. Pt found to have PNA. Pt did pass a [facility] swallow screen 02/14/22. PMH: significant for COPD, GERD, HTN,  HLD, CHF, OSA. Pt seen by pulmonology in 2019 for respiratory symptoms felt to be related to GERD. Type of Study: Bedside Swallow Evaluation Previous Swallow Assessment: none in chart Diet Prior to this Study: Regular;Thin liquids Temperature Spikes Noted: Yes (100.7) Respiratory Status: Nasal cannula History of Recent Intubation: No Behavior/Cognition: Alert;Cooperative;Pleasant mood Oral Cavity Assessment: Within Functional Limits Oral Care Completed by SLP: No Oral Cavity - Dentition: Adequate natural dentition Vision: Functional for self-feeding Self-Feeding Abilities: Able to feed self Patient Positioning: Other (comment) (EOB) Baseline Vocal Quality: Hoarse (rough) Volitional Cough: Congested Volitional Swallow: Able to elicit    Oral/Motor/Sensory Function Overall Oral Motor/Sensory Function: Within functional limits   Ice Chips Ice chips: Not tested   Thin Liquid Thin Liquid: Within functional limits Presentation: Self Fed;Straw    Nectar Thick Nectar Thick Liquid: Not tested   Honey Thick Honey Thick Liquid: Not tested   Puree Puree: Within functional limits Presentation: Self Fed;Spoon   Solid     Solid: Within functional limits Presentation: Self Fed      Osie Bond., M.A. [place] Office [phone]  Secure chat preferred  02/16/2022,1:19 PM

## 2022-02-16 NOTE — Progress Notes (Addendum)
SATURATION QUALIFICATIONS: (This note is used to comply with regulatory documentation for home oxygen)  Patient Saturations on Room Air at Rest = 91%  Patient Saturations on Room Air while Ambulating = 86%  Patient Saturations on 2 Liters of oxygen while Ambulating = 93%  Please briefly explain why patient needs home oxygen: Pt hypoxic on room air with exertion, SpO2 WFL on 2L O2 [place] for household distance ambulation in hallway.

## 2022-02-16 NOTE — Progress Notes (Signed)
PROGRESS NOTE                                                                                                                                                                                                             Patient Demographics:    Rachel [name], is a 61 y.o. female, DOB - 12/20/1960, ZJI:[id number]  Outpatient Primary MD for the patient is Redmond School, MD    LOS - 1  Admit date - 02/13/2022    Chief Complaint  Patient presents with   Cough   Atrial Fibrillation       Brief Narrative (HPI from H&P)   61 y.o. female with medical history significant of COPD,? HTN, HLD, diastolic CHF, OSA, obesity who presented to the hospital with cough, shortness of breath, excessive fatigue over the last 1 week.  Work-up in the ER consistent with pneumonia and she was admitted for further care.   Subjective:   Patient in bed, appears comfortable, denies any headache, no fever, no chest pain or pressure, improved shortness of breath , no abdominal pain. No new focal weakness.   Assessment  & Plan :    Acute hypoxic respiratory failure, sepsis due to community-acquired pneumonia.  She has been appropriately placed on IV fluids, IV antibiotics, sepsis pathophysiology is resolving, continue to follow cultures continue supportive care, encouraged to sit up in chair use I-S and flutter valve in daytime for pulmonary toiletry.   Hyperthyroidism.  Placed on methimazole.   HYPERCHOLESTEROLEMIA - Chronic stable continue Crestor 5 mg p.o. daily   Essential hypertension, benign - blood pressure improved placed on beta-blocker.   Hyponatremia - due to dehydration resolved with IV fluids.   Elevated troponin - due to demand mismatch from hypoxia, trend is flat and in non-ACS pattern, continue to monitor.  Placed on aspirin, check echocardiogram to evaluate wall motion.  Already on beta-blocker and statin for secondary prevention.   Chest pain-free.        Condition - Extremely Guarded  Family Communication  :  None presnt  Code Status :  Full  Consults  :  Non  PUD Prophylaxis :    Procedures  :     CT - 1. Right lower lobe pneumonia. Clinical correlation and follow-up to resolution recommended. 2. Aortic Atherosclerosis       Disposition Plan  :  Status is: Inpatient  DVT Prophylaxis  :    Place and maintain sequential compression device Start: 02/14/22 2038    Lab Results  Component Value Date   PLT 361 02/16/2022    Diet :  Diet Order             Diet Heart Room service appropriate? Yes; Fluid consistency: Thin  Diet effective now                    Inpatient Medications  Scheduled Meds:  aspirin  81 mg Oral Daily   enoxaparin (LOVENOX) injection  50 mg Subcutaneous Q24H   guaiFENesin  600 mg Oral BID   ipratropium  0.5 mg Nebulization BID   levalbuterol  0.63 mg Nebulization BID   methimazole  5 mg Oral BID   metoprolol tartrate  50 mg Oral BID   sodium chloride flush  3 mL Intravenous Q12H   Continuous Infusions:  sodium chloride     ceFEPime (MAXIPIME) IV     doxycycline (VIBRAMYCIN) IV 100 mg (02/16/22 1008)   PRN Meds:.sodium chloride, acetaminophen **OR** acetaminophen, albuterol, diltiazem, sodium chloride flush  Time Spent in minutes  30   Lala Lund M.D on 02/16/2022 at 11:11 AM  To page go to [email or web address]   [facility] -  Office  [phone]  See all Orders from today for further details    Objective:   Vitals:   02/15/22 2351 02/16/22 0347 02/16/22 0750 02/16/22 0828  BP: 118/75 (!) 109/57  (!) 113/58  Pulse: 84 93  94  Resp: '20 19  15  '$ Temp: 98.3 F (36.8 C) 98.3 F (36.8 C)  98.3 F (36.8 C)  TempSrc: Oral Oral  Oral  SpO2: 98% 100% 100% 97%  Weight:      Height:        Wt Readings from Last 3 Encounters:  02/13/22 90.7 kg  03/13/21 90.7 kg  03/10/21 90.7 kg     Intake/Output Summary (Last 24 hours) at 02/16/2022  1111 Last data filed at 02/16/2022 0600 Gross per 24 hour  Intake 700 ml  Output --  Net 700 ml     Physical Exam  Awake Alert, No new F.N deficits, Normal affect [place].AT,PERRAL Supple Neck, No JVD,   Symmetrical Chest wall movement, Good air movement bilaterally, CTAB RRR,No Gallops, Rubs or new Murmurs,  +ve B.Sounds, Abd Soft, No tenderness,   No Cyanosis, Clubbing or edema       Data Review:    CBC Recent Labs  Lab 02/13/22 2146 02/14/22 1956 02/14/22 2213 02/16/22 0252  WBC 13.9* 10.8* 10.0 6.6  HGB 12.1 11.6* 10.9* 10.5*  HCT 37.0 36.4 33.7* 33.8*  PLT 391 358 345 361  MCV 84.5 87.3 86.9 88.3  MCH 27.6 27.8 28.1 27.4  MCHC 32.7 31.9 32.3 31.1  RDW 14.8 14.9 15.2 15.3  LYMPHSABS 1.2 0.8  --  1.3  MONOABS 1.1* 1.0  --  0.9  EOSABS 0.0 0.1  --  0.1  BASOSABS 0.1 0.0  --  0.0    Electrolytes Recent Labs  Lab 02/13/22 2146 02/13/22 2316 02/13/22 2352 02/14/22 0017 02/14/22 1956 02/14/22 2213 02/15/22 1834 02/16/22 0252  NA 130*  --   --   --  135  --  136 137  K 3.9  --   --   --  3.8  --  4.0 3.6  CL 93*  --   --   --  101  --  101 102  CO2 24  --   --   --  24  --  25 28  GLUCOSE 118*  --   --   --  120*  --  110* 115*  BUN 22*  --   --   --  20  --  18 19  CREATININE 1.24*  --   --   --  0.81  --  0.65 0.71  CALCIUM 9.5  --   --   --  9.0  --  8.9 8.8*  AST 20  --   --   --  28  --  34 36  ALT 13  --   --   --  18  --  21 23  ALKPHOS 91  --   --   --  96  --  97 95  BILITOT 1.2  --   --   --  1.3*  --  0.9 0.5  ALBUMIN 4.1  --   --   --  3.2*  --  2.8* 2.7*  MG  --  2.0  --   --  2.3  --   --  2.2  CRP  --   --   --   --   --  32.7*  --  27.4*  PROCALCITON  --   --   --   --  1.03  --   --  0.50  LATICACIDVEN 1.3  --  1.2  --  1.7 0.9  --   --   TSH  --   --   --   --  0.127* 0.105*  --   --   BNP  --   --   --  87.5  --   --   --  71.5     ------------------------------------------------------------------------------------------------------------------ No results for input(s): "CHOL", "HDL", "LDLCALC", "TRIG", "CHOLHDL", "LDLDIRECT" in the last 72 hours.  No results found for: "HGBA1C"  Recent Labs    02/14/22 2213  TSH 0.105*   ------------------------------------------------------------------------------------------------------------------ ID Labs Recent Labs  Lab 02/13/22 2146 02/13/22 2352 02/14/22 1956 02/14/22 2213 02/15/22 1834 02/16/22 0252  WBC 13.9*  --  10.8* 10.0  --  6.6  PLT 391  --  358 345  --  361  CRP  --   --   --  32.7*  --  27.4*  PROCALCITON  --   --  1.03  --   --  0.50  LATICACIDVEN 1.3 1.2 1.7 0.9  --   --   CREATININE 1.24*  --  0.81  --  0.65 0.71    Radiology Reports ECHOCARDIOGRAM COMPLETE  Result Date: 02/15/2022    ECHOCARDIOGRAM REPORT   Patient Name:   MEILY GLOWACKI Date of Exam: 02/15/2022 Medical Rec #:  [id number]          Height:       60.0 in Accession #:    [id number]         Weight:       200.0 lb Date of Birth:  07/12/1960           BSA:          1.866 m Patient Age:    28 years           BP:           117/63 mmHg Patient Gender: F                  HR:  90 bpm. Exam Location:  Inpatient Procedure: 2D Echo, Cardiac Doppler and Color Doppler Indications:    elevated Troponin  History:        Patient has prior history of Echocardiogram examinations, most                 recent 10/12/2016. CHF, COPD; Risk Factors:Hypertension and                 Dyslipidemia.  Sonographer:    Wenda Low Referring Phys: [id number] ANASTASSIA DOUTOVA  Sonographer Comments: Patient is obese. Image acquisition challenging due to respiratory motion. IMPRESSIONS  1. Left ventricular ejection fraction, by estimation, is 40 to 45%. The left ventricle has mildly decreased function. The left ventricle demonstrates global hypokinesis. There is mild concentric left ventricular hypertrophy. Left  ventricular diastolic parameters are consistent with Grade II diastolic dysfunction (pseudonormalization). Elevated left atrial pressure.  2. Right ventricular systolic function is normal. The right ventricular size is normal. Tricuspid regurgitation signal is inadequate for assessing PA pressure.  3. Left atrial size was mildly dilated.  4. The mitral valve is normal in structure. Mild to moderate mitral valve regurgitation.  5. The aortic valve is tricuspid. Aortic valve regurgitation is not visualized. Comparison(s): A prior study was performed on 10/12/2016. Prior images unable to be directly viewed, comparison made by report only. The left ventricular function is worsened. LBBB is also new since 2018. FINDINGS  Left Ventricle: Left ventricular ejection fraction, by estimation, is 40 to 45%. The left ventricle has mildly decreased function. The left ventricle demonstrates global hypokinesis. The left ventricular internal cavity size was normal in size. There is  mild concentric left ventricular hypertrophy. Abnormal (paradoxical) septal motion, consistent with left bundle branch block. Left ventricular diastolic parameters are consistent with Grade II diastolic dysfunction (pseudonormalization). Elevated left atrial pressure. Right Ventricle: The right ventricular size is normal. No increase in right ventricular wall thickness. Right ventricular systolic function is normal. Tricuspid regurgitation signal is inadequate for assessing PA pressure. Left Atrium: Left atrial size was mildly dilated. Right Atrium: Right atrial size was normal in size. Pericardium: There is no evidence of pericardial effusion. Mitral Valve: The mitral valve is normal in structure. Mild mitral annular calcification. Mild to moderate mitral valve regurgitation, with centrally-directed jet. MV peak gradient, 9.1 mmHg. The mean mitral valve gradient is 3.0 mmHg. Tricuspid Valve: The tricuspid valve is normal in structure. Tricuspid valve  regurgitation is not demonstrated. Aortic Valve: The aortic valve is tricuspid. Aortic valve regurgitation is not visualized. Aortic valve mean gradient measures 5.0 mmHg. Aortic valve peak gradient measures 9.1 mmHg. Aortic valve area, by VTI measures 2.19 cm. Pulmonic Valve: The pulmonic valve was not well visualized. Pulmonic valve regurgitation is not visualized. Aorta: The aortic root is normal in size and structure. IAS/Shunts: No atrial level shunt detected by color flow Doppler.  LEFT VENTRICLE PLAX 2D LVIDd:         4.60 cm     Diastology LVIDs:         3.30 cm     LV e' medial:    8.81 cm/s LV PW:         1.30 cm     LV E/e' medial:  14.7 LV IVS:        1.30 cm     LV e' lateral:   7.00 cm/s LVOT diam:     2.00 cm     LV E/e' lateral: 18.5 LV SV:  61 LV SV Index:   32 LVOT Area:     3.14 cm  LV Volumes (MOD) LV vol d, MOD A2C: 63.5 ml LV vol d, MOD A4C: 68.0 ml LV vol s, MOD A2C: 37.2 ml LV vol s, MOD A4C: 30.0 ml LV SV MOD A2C:     26.3 ml LV SV MOD A4C:     68.0 ml LV SV MOD BP:      35.9 ml RIGHT VENTRICLE RV Basal diam:  3.00 cm RV Mid diam:    2.10 cm RV S prime:     12.60 cm/s TAPSE (M-mode): 2.0 cm LEFT ATRIUM             Index        RIGHT ATRIUM           Index LA diam:        4.10 cm 2.20 cm/m   RA Area:     11.50 cm LA Vol (A2C):   76.5 ml 40.99 ml/m  RA Volume:   25.90 ml  13.88 ml/m LA Vol (A4C):   58.0 ml 31.08 ml/m LA Biplane Vol: 70.9 ml 37.99 ml/m  AORTIC VALVE                     PULMONIC VALVE AV Area (Vmax):    2.29 cm      PV Vmax:       0.89 m/s AV Area (Vmean):   1.83 cm      PV Peak grad:  3.1 mmHg AV Area (VTI):     2.19 cm AV Vmax:           151.00 cm/s AV Vmean:          106.000 cm/s AV VTI:            0.277 m AV Peak Grad:      9.1 mmHg AV Mean Grad:      5.0 mmHg LVOT Vmax:         110.00 cm/s LVOT Vmean:        61.900 cm/s LVOT VTI:          0.193 m LVOT/AV VTI ratio: 0.70  AORTA Ao Root diam: 3.40 cm MITRAL VALVE MV Area (PHT): 3.65 cm     SHUNTS MV Area  VTI:   1.88 cm     Systemic VTI:  0.19 m MV Peak grad:  9.1 mmHg     Systemic Diam: 2.00 cm MV Mean grad:  3.0 mmHg MV Vmax:       1.51 m/s MV Vmean:      85.4 cm/s MV Decel Time: 208 msec MV E velocity: 129.50 cm/s MV A velocity: 120.00 cm/s MV E/A ratio:  1.08 Mihai Croitoru MD Electronically signed by Sanda Klein MD Signature Date/Time: 02/15/2022/2:33:58 PM    Final    DG Chest Port 1 View  Result Date: 02/15/2022 CLINICAL DATA:  Shortness of breath. EXAM: PORTABLE CHEST 1 VIEW COMPARISON:  02/14/2022 at 7:37 p.m. FINDINGS: Lungs are hypoinflated demonstrate mild bibasilar opacification which may be due to atelectasis or infection. Possible small right effusion. Mild hazy prominence of the central pulmonary vessels suggesting a degree of vascular congestion. Mild stable cardiomegaly. Remainder of the exam is unchanged. IMPRESSION: 1. Hypoinflation with mild bibasilar opacification which may be due to atelectasis or infection. Possible small right effusion. 2. Mild stable cardiomegaly with suggestion of mild vascular congestion. Electronically Signed   By: Marin Olp M.D.  On: 02/15/2022 10:22   DG CHEST PORT 1 VIEW  Result Date: 02/14/2022 CLINICAL DATA:  Shortness of breath and cough. EXAM: PORTABLE CHEST 1 VIEW COMPARISON:  Chest radiograph dated 02/13/2022 and CT dated 02/15/2019. FINDINGS: Shallow inspiration. There is mild cardiomegaly with mild vascular congestion. Bibasilar atelectasis. Right lung base opacity seen on the earlier CT is suboptimally visualized on radiograph. No pleural effusion pneumothorax. Atherosclerotic calcification of the aortic arch. No acute osseous pathology. IMPRESSION: Mild cardiomegaly with mild vascular congestion. Electronically Signed   By: Anner Crete M.D.   On: 02/14/2022 19:54   CT Chest Wo Contrast  Result Date: 02/14/2022 CLINICAL DATA:  Pneumonia. EXAM: CT CHEST WITHOUT CONTRAST TECHNIQUE: Multidetector CT imaging of the chest was performed  following the standard protocol without IV contrast. RADIATION DOSE REDUCTION: This exam was performed according to the departmental dose-optimization program which includes automated exposure control, adjustment of the mA and/or kV according to patient size and/or use of iterative reconstruction technique. COMPARISON:  Chest radiograph dated 02/13/2022. FINDINGS: Evaluation of this exam is limited in the absence of intravenous contrast as well as due to respiratory motion. Cardiovascular: Top-normal cardiac size. No pericardial effusion. Three-vessel coronary vascular calcification. Mild atherosclerotic calcification of the thoracic aorta. No aneurysmal dilatation. The central pulmonary arteries are grossly unremarkable. Mediastinum/Nodes: No obvious hilar or mediastinal adenopathy. The esophagus and thyroid gland are grossly unremarkable. No mediastinal fluid collection. Lungs/Pleura: There is an area of consolidation in the right lower lobe most consistent with pneumonia. Clinical correlation and follow-up to resolution recommended. Linear atelectasis or scarring in the lingula. There is no pleural effusion or pneumothorax. The central airways are patent. Upper Abdomen: No acute abnormality. Musculoskeletal: Degenerative changes of the spine. No acute osseous pathology. IMPRESSION: 1. Right lower lobe pneumonia. Clinical correlation and follow-up to resolution recommended. 2. Aortic Atherosclerosis (ICD10-I70.0). Electronically Signed   By: Anner Crete M.D.   On: 02/14/2022 00:29   DG Chest Port 1 View  Result Date: 02/13/2022 CLINICAL DATA:  Cough. EXAM: PORTABLE CHEST 1 VIEW COMPARISON:  December 20, 2021 FINDINGS: The heart size and mediastinal contours are within normal limits. There is moderate severity calcification of the aortic arch. Low lung volumes are noted. Mild areas of linear scarring and/or atelectasis are seen within the bilateral lung bases. There is no evidence of an acute infiltrate,  pleural effusion or pneumothorax. The visualized skeletal structures are unremarkable. IMPRESSION: Mild bibasilar linear scarring and/or atelectasis. Electronically Signed   By: Virgina Norfolk M.D.   On: 02/13/2022 23:17

## 2022-02-16 NOTE — Progress Notes (Signed)
Pharmacy Antibiotic Note  Rachel [name] is a 61 y.o. female admitted on 02/13/2022 presenting with cough, fever, concern for sepsis.  Pharmacy has been consulted for cefepime dosing.  Pt has tolerated cephalosporins in past.  Plan: Adjust Cefepime 2g IV to every 8 hours Monitor renal function, Cx and clinical progression to narrow Vancomycin levels as indicated  Height: 5' (152.4 cm) Weight: 90.7 kg (199 lb 15.3 oz) IBW/kg (Calculated) : 45.5  Temp (24hrs), Avg:98.8 F (37.1 C), Min:98 F (36.7 C), Max:100.7 F (38.2 C)  Recent Labs  Lab 02/13/22 2146 02/13/22 2352 02/14/22 1956 02/14/22 2213 02/15/22 1834 02/16/22 0252  WBC 13.9*  --  10.8* 10.0  --  6.6  CREATININE 1.24*  --  0.81  --  0.65 0.71  LATICACIDVEN 1.3 1.2 1.7 0.9  --   --      Estimated Creatinine Clearance: 76 mL/min (by C-G formula based on SCr of 0.71 mg/dL).    Allergies  Allergen Reactions   Hydrocodone Nausea And Vomiting   Oxycodone Nausea And Vomiting    Codeine, hydrocodone, most pain pills.   Penicillins Itching, Nausea And Vomiting and Swelling    Has patient had a PCN reaction causing immediate rash, facial/tongue/throat swelling, SOB or lightheadedness with hypotension: Yes, around the eyes Has patient had a PCN reaction causing severe rash involving mucus membranes or skin necrosis: No Has patient had a PCN reaction that required hospitalization: No Has patient had a PCN reaction occurring within the last 10 years: No  If all of the above answers are "NO", then may proceed with Cephalosporin use.    Precedex [Dexmedetomidine Hcl In Nacl] Other (See Comments)    Increased agitation/confusion in ICU [April 2018]   Seroquel [Quetiapine] Other (See Comments)    More agitated/confused per daughter [name] when given in the past.   Shellfish Allergy Itching    Antibiotics this admission: 8/18 Cefepime >> 8/19 Doxy >> 8/18 Vanc >>8/19  Cultures: 8/18 BCx: NG < 24 hours  8/18 MRSA PCR  negative  8/19 UCx: <10k insignificant growth 8/19 Sputum: rare GPC, rare GVR   Thank you for allowing [place] to participate in this patients care. Jens Som, PharmD 02/16/2022 8:32 AM  **Pharmacist phone directory can be found on [email or web address] listed under [place]**

## 2022-02-16 NOTE — Progress Notes (Signed)
Pt refused CPAP for tonight.  

## 2022-02-16 NOTE — Progress Notes (Signed)
Physical Therapy Treatment Patient Details Name: Rachel [name] MRN: [id number] DOB: 04/30/1961 Today's Date: 02/16/2022   History of Present Illness 61 y.o. F admitted on 02/15/22 due to SoB, fatigue, and cough. Pt found to have Pneumonia. PMH significant for COPD, HTN, HLD, CHF, OSA.    PT Comments    Pt received seated EOB, RN having recently assisted pt to/from bathroom, pt agreeable to therapy session with emphasis on gait progression in hallway and O2 sats monitoring. Pt hypoxic on RA with exertion with desat to 86% on RA, SpO2 improved to >92% on 2L O2 [place] with exertion. Pt pulling 200-359m only on IS, reviewed technique and frequency as pt reports she was not using it prior to demo. Pt continues to benefit from PT services to progress toward functional mobility goals.   Recommendations for follow up therapy are one component of a multi-disciplinary discharge planning process, led by the attending physician.  Recommendations may be updated based on patient status, additional functional criteria and insurance authorization.  Follow Up Recommendations  No PT follow up     Assistance Recommended at Discharge PRN  Patient can return home with the following     Equipment Recommendations  None recommended by PT;Other (comment) (portable O2 equipment for ambulation in home/community)    Recommendations for Other Services       Precautions / Restrictions Precautions Precaution Comments: watch O2 sats Restrictions Weight Bearing Restrictions: No     Mobility  Bed Mobility               General bed mobility comments: sitting EOB pre/post    Transfers Overall transfer level: Modified independent Equipment used: None               General transfer comment: No assist    Ambulation/Gait Ambulation/Gait assistance: Supervision Gait Distance (Feet): 300 Feet Assistive device: None Gait Pattern/deviations: Step-through pattern, Shuffle Gait velocity: decreased      General Gait Details: using railing only when taking standing break 2/2 fatigue in hallway; SpO2 desat on RA, needing 2L to achieve >92%, one additional desat on 2L (to ~88-89% briefly) however pt appropriately taking standing break and rapid return to >92%. Shuffling steps due to slide sandals but no overt LOB.   Stairs Stairs:  (pt defers today due to fatigue)              Balance Overall balance assessment: No apparent balance deficits (not formally assessed)                                          Cognition Arousal/Alertness: Awake/alert Behavior During Therapy: WFL for tasks assessed/performed Overall Cognitive Status: Within Functional Limits for tasks assessed                                 General Comments: Some decreased navigational awareness/increased time to re-orient to find room when ambulating in hallway but able to recall her room #.        Exercises IS x 10 reps (200-300 mL)  General Comments General comments (skin integrity, edema, etc.): 86% desat with exertion on RA, mostly >92% on 2L, when brief desat to ~89% improves with pt self-directed standing rest break to >92%. Resting SpO2 remains >88% on RA but per RN OK to maintain 2L [place] upon return to  room.      Pertinent Vitals/Pain Pain Assessment Pain Assessment: No/denies pain     PT Goals (current goals can now be found in the care plan section) Acute Rehab PT Goals Patient Stated Goal: to go home, less short of breath PT Goal Formulation: With patient Time For Goal Achievement: 02/22/22 Progress towards PT goals: Progressing toward goals    Frequency    Min 3X/week      PT Plan Current plan remains appropriate       AM-PAC PT "6 Clicks" Mobility   Outcome Measure  Help needed turning from your back to your side while in a flat bed without using bedrails?: None Help needed moving from lying on your back to sitting on the side of a flat bed without  using bedrails?: None Help needed moving to and from a bed to a chair (including a wheelchair)?: None Help needed standing up from a chair using your arms (e.g., wheelchair or bedside chair)?: None Help needed to walk in hospital room?: A Little (due to lines/O2 equipment) Help needed climbing 3-5 steps with a railing? : A Little 6 Click Score: 22    End of Session Equipment Utilized During Treatment: Gait belt;Oxygen Activity Tolerance: Patient tolerated treatment well Patient left: with call bell/phone within reach;in bed;Other (comment) (pt sitting EOB to order lunch) Nurse Communication: Mobility status;Other (comment) (walking O2 sats) PT Visit Diagnosis: Other abnormalities of gait and mobility (R26.89) (decr functional capacity)     Time: 6378-5885 PT Time Calculation (min) (ACUTE ONLY): 27 min  Charges:  $Gait Training: 23-37 mins $Therapeutic Activity: 8-22 mins                     Odena Mcquaid P., PTA Acute Rehabilitation Services Secure Chat Preferred 9a-5:30pm Office: [place] 02/16/2022, 11:33 AM

## 2022-02-17 DIAGNOSIS — J9621 Acute and chronic respiratory failure with hypoxia: Secondary | ICD-10-CM | POA: Diagnosis not present

## 2022-02-17 DIAGNOSIS — J189 Pneumonia, unspecified organism: Secondary | ICD-10-CM | POA: Diagnosis not present

## 2022-02-17 DIAGNOSIS — I5032 Chronic diastolic (congestive) heart failure: Secondary | ICD-10-CM | POA: Diagnosis not present

## 2022-02-17 LAB — CBC WITH DIFFERENTIAL/PLATELET
Abs Immature Granulocytes: 0.02 10*3/uL (ref 0.00–0.07)
Basophils Absolute: 0 10*3/uL (ref 0.0–0.1)
Basophils Relative: 1 %
Eosinophils Absolute: 0.2 10*3/uL (ref 0.0–0.5)
Eosinophils Relative: 3 %
HCT: 30.7 % — ABNORMAL LOW (ref 36.0–46.0)
Hemoglobin: 9.8 g/dL — ABNORMAL LOW (ref 12.0–15.0)
Immature Granulocytes: 0 %
Lymphocytes Relative: 21 %
Lymphs Abs: 1.3 10*3/uL (ref 0.7–4.0)
MCH: 27.7 pg (ref 26.0–34.0)
MCHC: 31.9 g/dL (ref 30.0–36.0)
MCV: 86.7 fL (ref 80.0–100.0)
Monocytes Absolute: 0.6 10*3/uL (ref 0.1–1.0)
Monocytes Relative: 10 %
Neutro Abs: 4 10*3/uL (ref 1.7–7.7)
Neutrophils Relative %: 65 %
Platelets: 387 10*3/uL (ref 150–400)
RBC: 3.54 MIL/uL — ABNORMAL LOW (ref 3.87–5.11)
RDW: 15.4 % (ref 11.5–15.5)
WBC: 6.1 10*3/uL (ref 4.0–10.5)
nRBC: 0 % (ref 0.0–0.2)

## 2022-02-17 LAB — CULTURE, RESPIRATORY W GRAM STAIN: Culture: NORMAL

## 2022-02-17 LAB — THYROID ANTIBODIES
Thyroglobulin Antibody: <1 IU/mL (ref 0.0–0.9)
Thyroperoxidase Ab SerPl-aCnc: 10 IU/mL (ref 0–34)

## 2022-02-17 LAB — COMPREHENSIVE METABOLIC PANEL
ALT: 27 U/L (ref 0–44)
AST: 35 U/L (ref 15–41)
Albumin: 2.6 g/dL — ABNORMAL LOW (ref 3.5–5.0)
Alkaline Phosphatase: 95 U/L (ref 38–126)
Anion gap: 10 (ref 5–15)
BUN: 15 mg/dL (ref 6–20)
CO2: 23 mmol/L (ref 22–32)
Calcium: 8.9 mg/dL (ref 8.9–10.3)
Chloride: 106 mmol/L (ref 98–111)
Creatinine, Ser: 0.52 mg/dL (ref 0.44–1.00)
GFR, Estimated: >60 mL/min (ref >60)
Glucose, Bld: 111 mg/dL — ABNORMAL HIGH (ref 70–99)
Potassium: 4.4 mmol/L (ref 3.5–5.1)
Sodium: 139 mmol/L (ref 135–145)
Total Bilirubin: 0.8 mg/dL (ref 0.3–1.2)
Total Protein: 6.7 g/dL (ref 6.5–8.1)

## 2022-02-17 LAB — THYROID STIMULATING IMMUNOGLOBULIN: Thyroid Stimulating Immunoglob: <0.1 IU/L (ref 0.00–0.55)

## 2022-02-17 LAB — BRAIN NATRIURETIC PEPTIDE: B Natriuretic Peptide: 146.8 pg/mL — ABNORMAL HIGH (ref 0.0–100.0)

## 2022-02-17 LAB — C-REACTIVE PROTEIN: CRP: 19.9 mg/dL — ABNORMAL HIGH (ref <1.0)

## 2022-02-17 LAB — MAGNESIUM: Magnesium: 2 mg/dL (ref 1.7–2.4)

## 2022-02-17 LAB — PROCALCITONIN: Procalcitonin: 0.27 ng/mL

## 2022-02-17 LAB — LEGIONELLA PNEUMOPHILA SEROGP 1 UR AG: L. pneumophila Serogp 1 Ur Ag: NEGATIVE

## 2022-02-17 MED ORDER — SPIRONOLACTONE 12.5 MG HALF TABLET
12.5000 mg | ORAL_TABLET | Freq: Every day | ORAL | Status: DC
  Administered 2022-02-18: 12.5 mg via ORAL
  Filled 2022-02-17 (×2): qty 1

## 2022-02-17 MED ORDER — IVABRADINE HCL 5 MG PO TABS: 10.0000 mg | ORAL_TABLET | Freq: Once | ORAL | Status: DC | PRN

## 2022-02-17 MED ORDER — LOSARTAN POTASSIUM 25 MG PO TABS
25.0000 mg | ORAL_TABLET | Freq: Every day | ORAL | Status: DC
  Administered 2022-02-18: 25 mg via ORAL
  Filled 2022-02-17: qty 1

## 2022-02-17 MED ORDER — ROSUVASTATIN CALCIUM 5 MG PO TABS
10.0000 mg | ORAL_TABLET | Freq: Every day | ORAL | Status: DC
  Administered 2022-02-17 – 2022-02-18 (×2): 10 mg via ORAL
  Filled 2022-02-17 (×3): qty 2

## 2022-02-17 MED ORDER — METOPROLOL TARTRATE 100 MG PO TABS: 100.0000 mg | ORAL_TABLET | Freq: Once | ORAL | Status: DC | PRN

## 2022-02-17 MED ORDER — LISINOPRIL 5 MG PO TABS
5.0000 mg | ORAL_TABLET | Freq: Every day | ORAL | Status: DC
  Administered 2022-02-17: 5 mg via ORAL
  Filled 2022-02-17: qty 1

## 2022-02-17 NOTE — Progress Notes (Signed)
Pt said she doesn't wear her CPAP at home so she isn't going to wear it here. RT told the patient to call her if she changes her mind.

## 2022-02-17 NOTE — Progress Notes (Signed)
PROGRESS NOTE                                                                                                                                                                                                             Patient Demographics:    Verlie Liotta, is a 61 y.o. female, DOB - Oct 04, 1960, FAO:[id number]  Outpatient Primary MD for the patient is Redmond School, MD    LOS - 2  Admit date - 02/13/2022    Chief Complaint  Patient presents with   Cough   Atrial Fibrillation       Brief Narrative (HPI from H&P)   61 y.o. female with medical history significant of COPD,? HTN, HLD, diastolic CHF, OSA, obesity who presented to the hospital with cough, shortness of breath, excessive fatigue over the last 1 week.  Work-up in the ER consistent with pneumonia and she was admitted for further care.   Subjective:   Patient in bed, appears comfortable, denies any headache, no fever, no chest pain or pressure, improved shortness of breath , no abdominal pain. No new focal weakness.   Assessment  & Plan :    Acute hypoxic respiratory failure, sepsis due to community-acquired pneumonia.  She has been appropriately placed on IV fluids, IV antibiotics, sepsis pathophysiology is resolving, continue to follow cultures continue supportive care, encouraged to sit up in chair use I-S and flutter valve in daytime for pulmonary toiletry.   Hyperthyroidism.  Placed on methimazole.   HYPERCHOLESTEROLEMIA - Chronic stable continue Crestor 5 mg p.o. daily   Essential hypertension, benign - blood pressure improved placed on beta-blocker.   Hyponatremia - due to dehydration resolved with IV fluids.   Elevated troponin - due to demand mismatch from hypoxia, trend is flat and in non-ACS pattern, continue to monitor.  Placed on aspirin, check echocardiogram to evaluate wall motion.  Already on beta-blocker and statin for secondary prevention.   Chest pain-free.  No events since she has a new left bundle branch block will get an echocardiogram.   Systolic heart failure new diagnosis.  Previous echo few years ago had a EF of 60%.  EF has not dropped to 40% with global hypokinesis and new left bundle branch block.  He is currently compensated on beta-blocker statin, aspirin we will add low-dose ACE inhibitor and get cardiology input.  Condition - Extremely Guarded  Family Communication  :  None presnt  Code Status :  Full  Consults  : Cards  PUD Prophylaxis :    Procedures  :     TTE -  1. Left ventricular ejection fraction, by estimation, is 40 to 45%. The left ventricle has mildly decreased function. The left ventricle demonstrates global hypokinesis. There is mild concentric left ventricular hypertrophy. Left ventricular diastolic parameters are consistent with Grade II diastolic dysfunction (pseudonormalization). Elevated left atrial pressure.  2. Right ventricular systolic function is normal. The right ventricular size is normal. Tricuspid regurgitation signal is inadequate for assessing PA pressure.  3. Left atrial size was mildly dilated.  4. The mitral valve is normal in structure. Mild to moderate mitral valve regurgitation.  5. The aortic valve is tricuspid. Aortic valve regurgitation is not visualized. Comparison(s): A prior study was performed on 10/12/2016. Prior images unable to be directly viewed, comparison made by report only. The left ventricular function is worsened. LBBB is also new since 2018.  CT - 1. Right lower lobe pneumonia. Clinical correlation and follow-up to resolution recommended. 2. Aortic Atherosclerosis       Disposition Plan  :    Status is: Inpatient  DVT Prophylaxis  :    Place and maintain sequential compression device Start: 02/14/22 2038    Lab Results  Component Value Date   PLT 387 02/17/2022    Diet :  Diet Order             Diet Heart Room service appropriate? Yes;  Fluid consistency: Thin  Diet effective now                    Inpatient Medications  Scheduled Meds:  aspirin  81 mg Oral Daily   enoxaparin (LOVENOX) injection  50 mg Subcutaneous Q24H   guaiFENesin  600 mg Oral BID   ipratropium  0.5 mg Nebulization BID   levalbuterol  0.63 mg Nebulization BID   methimazole  5 mg Oral BID   metoprolol tartrate  50 mg Oral BID   rosuvastatin  10 mg Oral Daily   sodium chloride flush  3 mL Intravenous Q12H   Continuous Infusions:  sodium chloride     ceFEPime (MAXIPIME) IV 2 g (02/17/22 4128)   doxycycline (VIBRAMYCIN) IV 100 mg (02/17/22 0906)   PRN Meds:.sodium chloride, acetaminophen **OR** acetaminophen, albuterol, diltiazem, sodium chloride flush  Time Spent in minutes  30   Lala Lund M.D on 02/17/2022 at 10:29 AM  To page go to [email or web address]   [facility] -  Office  [phone]  See all Orders from today for further details    Objective:   Vitals:   02/16/22 1606 02/16/22 2021 02/17/22 0748 02/17/22 0856  BP: (!) 151/61   (!) 143/53  Pulse: 95  95 97  Resp: (!) '22  20 18  '$ Temp: 98.9 F (37.2 C)   98.2 F (36.8 C)  TempSrc: Oral   Oral  SpO2: 95% 98% 90% 95%  Weight:      Height:        Wt Readings from Last 3 Encounters:  02/13/22 90.7 kg  03/13/21 90.7 kg  03/10/21 90.7 kg     Intake/Output Summary (Last 24 hours) at 02/17/2022 1029 Last data filed at 02/16/2022 2135 Gross per 24 hour  Intake 363 ml  Output --  Net 363 ml     Physical Exam  Awake Alert, No new F.N deficits, Normal  affect [place].AT,PERRAL Supple Neck, No JVD,   Symmetrical Chest wall movement, Good air movement bilaterally, CTAB RRR,No Gallops, Rubs or new Murmurs,  +ve B.Sounds, Abd Soft, No tenderness,   No Cyanosis, Clubbing or edema       Data Review:    CBC Recent Labs  Lab 02/13/22 2146 02/14/22 1956 02/14/22 2213 02/16/22 0252 02/17/22 0303  WBC 13.9* 10.8* 10.0 6.6 6.1  HGB 12.1 11.6* 10.9* 10.5*  9.8*  HCT 37.0 36.4 33.7* 33.8* 30.7*  PLT 391 358 345 361 387  MCV 84.5 87.3 86.9 88.3 86.7  MCH 27.6 27.8 28.1 27.4 27.7  MCHC 32.7 31.9 32.3 31.1 31.9  RDW 14.8 14.9 15.2 15.3 15.4  LYMPHSABS 1.2 0.8  --  1.3 1.3  MONOABS 1.1* 1.0  --  0.9 0.6  EOSABS 0.0 0.1  --  0.1 0.2  BASOSABS 0.1 0.0  --  0.0 0.0    Electrolytes Recent Labs  Lab 02/13/22 2146 02/13/22 2316 02/13/22 2352 02/14/22 0017 02/14/22 1956 02/14/22 2213 02/15/22 1834 02/16/22 0252 02/17/22 0303  NA 130*  --   --   --  135  --  136 137 139  K 3.9  --   --   --  3.8  --  4.0 3.6 4.4  CL 93*  --   --   --  101  --  101 102 106  CO2 24  --   --   --  24  --  '25 28 23  '$ GLUCOSE 118*  --   --   --  120*  --  110* 115* 111*  BUN 22*  --   --   --  20  --  '18 19 15  '$ CREATININE 1.24*  --   --   --  0.81  --  0.65 0.71 0.52  CALCIUM 9.5  --   --   --  9.0  --  8.9 8.8* 8.9  AST 20  --   --   --  28  --  34 36 35  ALT 13  --   --   --  18  --  '21 23 27  '$ ALKPHOS 91  --   --   --  96  --  97 95 95  BILITOT 1.2  --   --   --  1.3*  --  0.9 0.5 0.8  ALBUMIN 4.1  --   --   --  3.2*  --  2.8* 2.7* 2.6*  MG  --  2.0  --   --  2.3  --   --  2.2 2.0  CRP  --   --   --   --   --  32.7*  --  27.4* 19.9*  PROCALCITON  --   --   --   --  1.03  --   --  0.50 0.27  LATICACIDVEN 1.3  --  1.2  --  1.7 0.9  --   --   --   TSH  --   --   --   --  0.127* 0.105*  --   --   --   BNP  --   --   --  87.5  --   --   --  71.5 146.8*    ------------------------------------------------------------------------------------------------------------------ No results for input(s): "CHOL", "HDL", "LDLCALC", "TRIG", "CHOLHDL", "LDLDIRECT" in the last 72 hours.  No results found for: "HGBA1C"  Recent Labs    02/14/22 2213  TSH  0.105*   ------------------------------------------------------------------------------------------------------------------ ID Labs Recent Labs  Lab 02/13/22 2146 02/13/22 2352 02/14/22 1956 02/14/22 2213  02/15/22 1834 02/16/22 0252 02/17/22 0303  WBC 13.9*  --  10.8* 10.0  --  6.6 6.1  PLT 391  --  358 345  --  361 387  CRP  --   --   --  32.7*  --  27.4* 19.9*  PROCALCITON  --   --  1.03  --   --  0.50 0.27  LATICACIDVEN 1.3 1.2 1.7 0.9  --   --   --   CREATININE 1.24*  --  0.81  --  0.65 0.71 0.52    Radiology Reports ECHOCARDIOGRAM COMPLETE  Result Date: 02/15/2022    ECHOCARDIOGRAM REPORT   Patient Name:   ATZIRI ZUBIATE Date of Exam: 02/15/2022 Medical Rec #:  [id number]          Height:       60.0 in Accession #:    [id number]         Weight:       200.0 lb Date of Birth:  1961/02/22           BSA:          1.866 m Patient Age:    70 years           BP:           117/63 mmHg Patient Gender: F                  HR:           90 bpm. Exam Location:  Inpatient Procedure: 2D Echo, Cardiac Doppler and Color Doppler Indications:    elevated Troponin  History:        Patient has prior history of Echocardiogram examinations, most                 recent 10/12/2016. CHF, COPD; Risk Factors:Hypertension and                 Dyslipidemia.  Sonographer:    Wenda Low Referring Phys: [id number] ANASTASSIA DOUTOVA  Sonographer Comments: Patient is obese. Image acquisition challenging due to respiratory motion. IMPRESSIONS  1. Left ventricular ejection fraction, by estimation, is 40 to 45%. The left ventricle has mildly decreased function. The left ventricle demonstrates global hypokinesis. There is mild concentric left ventricular hypertrophy. Left ventricular diastolic parameters are consistent with Grade II diastolic dysfunction (pseudonormalization). Elevated left atrial pressure.  2. Right ventricular systolic function is normal. The right ventricular size is normal. Tricuspid regurgitation signal is inadequate for assessing PA pressure.  3. Left atrial size was mildly dilated.  4. The mitral valve is normal in structure. Mild to moderate mitral valve regurgitation.  5. The aortic valve is tricuspid. Aortic  valve regurgitation is not visualized. Comparison(s): A prior study was performed on 10/12/2016. Prior images unable to be directly viewed, comparison made by report only. The left ventricular function is worsened. LBBB is also new since 2018. FINDINGS  Left Ventricle: Left ventricular ejection fraction, by estimation, is 40 to 45%. The left ventricle has mildly decreased function. The left ventricle demonstrates global hypokinesis. The left ventricular internal cavity size was normal in size. There is  mild concentric left ventricular hypertrophy. Abnormal (paradoxical) septal motion, consistent with left bundle branch block. Left ventricular diastolic parameters are consistent with Grade II diastolic dysfunction (pseudonormalization). Elevated left atrial pressure. Right Ventricle: The right ventricular size is normal. No increase  in right ventricular wall thickness. Right ventricular systolic function is normal. Tricuspid regurgitation signal is inadequate for assessing PA pressure. Left Atrium: Left atrial size was mildly dilated. Right Atrium: Right atrial size was normal in size. Pericardium: There is no evidence of pericardial effusion. Mitral Valve: The mitral valve is normal in structure. Mild mitral annular calcification. Mild to moderate mitral valve regurgitation, with centrally-directed jet. MV peak gradient, 9.1 mmHg. The mean mitral valve gradient is 3.0 mmHg. Tricuspid Valve: The tricuspid valve is normal in structure. Tricuspid valve regurgitation is not demonstrated. Aortic Valve: The aortic valve is tricuspid. Aortic valve regurgitation is not visualized. Aortic valve mean gradient measures 5.0 mmHg. Aortic valve peak gradient measures 9.1 mmHg. Aortic valve area, by VTI measures 2.19 cm. Pulmonic Valve: The pulmonic valve was not well visualized. Pulmonic valve regurgitation is not visualized. Aorta: The aortic root is normal in size and structure. IAS/Shunts: No atrial level shunt detected by  color flow Doppler.  LEFT VENTRICLE PLAX 2D LVIDd:         4.60 cm     Diastology LVIDs:         3.30 cm     LV e' medial:    8.81 cm/s LV PW:         1.30 cm     LV E/e' medial:  14.7 LV IVS:        1.30 cm     LV e' lateral:   7.00 cm/s LVOT diam:     2.00 cm     LV E/e' lateral: 18.5 LV SV:         61 LV SV Index:   32 LVOT Area:     3.14 cm  LV Volumes (MOD) LV vol d, MOD A2C: 63.5 ml LV vol d, MOD A4C: 68.0 ml LV vol s, MOD A2C: 37.2 ml LV vol s, MOD A4C: 30.0 ml LV SV MOD A2C:     26.3 ml LV SV MOD A4C:     68.0 ml LV SV MOD BP:      35.9 ml RIGHT VENTRICLE RV Basal diam:  3.00 cm RV Mid diam:    2.10 cm RV S prime:     12.60 cm/s TAPSE (M-mode): 2.0 cm LEFT ATRIUM             Index        RIGHT ATRIUM           Index LA diam:        4.10 cm 2.20 cm/m   RA Area:     11.50 cm LA Vol (A2C):   76.5 ml 40.99 ml/m  RA Volume:   25.90 ml  13.88 ml/m LA Vol (A4C):   58.0 ml 31.08 ml/m LA Biplane Vol: 70.9 ml 37.99 ml/m  AORTIC VALVE                     PULMONIC VALVE AV Area (Vmax):    2.29 cm      PV Vmax:       0.89 m/s AV Area (Vmean):   1.83 cm      PV Peak grad:  3.1 mmHg AV Area (VTI):     2.19 cm AV Vmax:           151.00 cm/s AV Vmean:          106.000 cm/s AV VTI:            0.277 m AV Peak Grad:  9.1 mmHg AV Mean Grad:      5.0 mmHg LVOT Vmax:         110.00 cm/s LVOT Vmean:        61.900 cm/s LVOT VTI:          0.193 m LVOT/AV VTI ratio: 0.70  AORTA Ao Root diam: 3.40 cm MITRAL VALVE MV Area (PHT): 3.65 cm     SHUNTS MV Area VTI:   1.88 cm     Systemic VTI:  0.19 m MV Peak grad:  9.1 mmHg     Systemic Diam: 2.00 cm MV Mean grad:  3.0 mmHg MV Vmax:       1.51 m/s MV Vmean:      85.4 cm/s MV Decel Time: 208 msec MV E velocity: 129.50 cm/s MV A velocity: 120.00 cm/s MV E/A ratio:  1.08 Mihai Croitoru MD Electronically signed by Sanda Klein MD Signature Date/Time: 02/15/2022/2:33:58 PM    Final    DG Chest Port 1 View  Result Date: 02/15/2022 CLINICAL DATA:  Shortness of breath. EXAM:  PORTABLE CHEST 1 VIEW COMPARISON:  02/14/2022 at 7:37 p.m. FINDINGS: Lungs are hypoinflated demonstrate mild bibasilar opacification which may be due to atelectasis or infection. Possible small right effusion. Mild hazy prominence of the central pulmonary vessels suggesting a degree of vascular congestion. Mild stable cardiomegaly. Remainder of the exam is unchanged. IMPRESSION: 1. Hypoinflation with mild bibasilar opacification which may be due to atelectasis or infection. Possible small right effusion. 2. Mild stable cardiomegaly with suggestion of mild vascular congestion. Electronically Signed   By: Marin Olp M.D.   On: 02/15/2022 10:22   DG CHEST PORT 1 VIEW  Result Date: 02/14/2022 CLINICAL DATA:  Shortness of breath and cough. EXAM: PORTABLE CHEST 1 VIEW COMPARISON:  Chest radiograph dated 02/13/2022 and CT dated 02/15/2019. FINDINGS: Shallow inspiration. There is mild cardiomegaly with mild vascular congestion. Bibasilar atelectasis. Right lung base opacity seen on the earlier CT is suboptimally visualized on radiograph. No pleural effusion pneumothorax. Atherosclerotic calcification of the aortic arch. No acute osseous pathology. IMPRESSION: Mild cardiomegaly with mild vascular congestion. Electronically Signed   By: Anner Crete M.D.   On: 02/14/2022 19:54   CT Chest Wo Contrast  Result Date: 02/14/2022 CLINICAL DATA:  Pneumonia. EXAM: CT CHEST WITHOUT CONTRAST TECHNIQUE: Multidetector CT imaging of the chest was performed following the standard protocol without IV contrast. RADIATION DOSE REDUCTION: This exam was performed according to the departmental dose-optimization program which includes automated exposure control, adjustment of the mA and/or kV according to patient size and/or use of iterative reconstruction technique. COMPARISON:  Chest radiograph dated 02/13/2022. FINDINGS: Evaluation of this exam is limited in the absence of intravenous contrast as well as due to respiratory  motion. Cardiovascular: Top-normal cardiac size. No pericardial effusion. Three-vessel coronary vascular calcification. Mild atherosclerotic calcification of the thoracic aorta. No aneurysmal dilatation. The central pulmonary arteries are grossly unremarkable. Mediastinum/Nodes: No obvious hilar or mediastinal adenopathy. The esophagus and thyroid gland are grossly unremarkable. No mediastinal fluid collection. Lungs/Pleura: There is an area of consolidation in the right lower lobe most consistent with pneumonia. Clinical correlation and follow-up to resolution recommended. Linear atelectasis or scarring in the lingula. There is no pleural effusion or pneumothorax. The central airways are patent. Upper Abdomen: No acute abnormality. Musculoskeletal: Degenerative changes of the spine. No acute osseous pathology. IMPRESSION: 1. Right lower lobe pneumonia. Clinical correlation and follow-up to resolution recommended. 2. Aortic Atherosclerosis (ICD10-I70.0). Electronically Signed   By: Anner Crete  M.D.   On: 02/14/2022 00:29   DG Chest Port 1 View  Result Date: 02/13/2022 CLINICAL DATA:  Cough. EXAM: PORTABLE CHEST 1 VIEW COMPARISON:  December 20, 2021 FINDINGS: The heart size and mediastinal contours are within normal limits. There is moderate severity calcification of the aortic arch. Low lung volumes are noted. Mild areas of linear scarring and/or atelectasis are seen within the bilateral lung bases. There is no evidence of an acute infiltrate, pleural effusion or pneumothorax. The visualized skeletal structures are unremarkable. IMPRESSION: Mild bibasilar linear scarring and/or atelectasis. Electronically Signed   By: Virgina Norfolk M.D.   On: 02/13/2022 23:17

## 2022-02-17 NOTE — Consult Note (Addendum)
Cardiology Consultation:   Patient ID: Rachel [name] MRN: [id number]; DOB: April 12, 1961  Admit date: 02/13/2022 Date of Consult: 02/17/2022  PCP:  Redmond School, MD   [facility] Providers Cardiologist:  New (Dr. Margaretann Loveless)  Patient Profile:   Rachel [name] is a 61 y.o. female with a history of grade 2 diastolic dysfunction on Echo in 2018, hypertension, hyperlipidemia, COPD, history of sleep apnea, and GERD who is being seen 02/17/2022 for the evaluation of new cardiomyopathy with mildly reduced EF at the request of Dr. Candiss Norse.  History of Present Illness:   Rachel [name] is a 61 year old female with the above history.  No known cardiac disease.  She was admitted in 09/2016 with acute hypoxic respiratory failure secondary from influenza and superimposed bacterial infection with ARDS with septic shock.  Echo during that admission showed LVEF of 60-65% with moderate LVH and grade 2 diastolic dysfunction.  Patient presented to the ED on 02/13/2022 further evaluation of of subjective fevers, chills, cough, and shortness of breath.  On arrival to the ED, patient was febrile with a temp of 101.5 and tachycardic with O2 sats in the low 90s on room air.  EKG showed sinus tachycardia, rate 127 bpm, with a new LBBB.  High-sensitivity troponin minimally elevated at 20 >> 27 not consistent with ACS.  BNP normal.  Chest x-ray showed mild bibasilar linear scarring and/or atelectasis. Chest CT showed right lower lobe pneumonia. WBC 13.9, Hgb 12.1, Plts 391. Na 130, K 3.9, Glucose 118, BUN 22, Cr 1.24. LFTs normal. COVID-19 negative. Lactic acid negative. Patient was admitted for acute hypoxic respiratory failure and sepsis secondary to community acquired pneumonia. She was started on IV fluids and IV antibiotics. Echo was ordered for further evaluation of new LBBB and showed LVEF of 40-45% with global hypokinesis and grade 2 diastolic dysfunction as well as mild to moderate MR. Cardiology was consulted for  further evaluation.  At the time of this evaluation, patient is resting comfortably no acute distress.  She is a intermittent nonproductive cough for the past couple months but it acutely worsened about 1 week prior to presenting to the ED.  She also reports new dyspnea on exertion with activity such as walking from her car into her apartment over the last 3 weeks.  However, she denies any shortness of breath at rest.  No orthopnea, PND, or lower extremity edema.  She reports some chest discomfort that she describes as a feeling of "trapped gas" that typically resolves after she has a bowel movement.  She has this in feeling intermittently at home.  She also had this sensation last night.  Again it resolved after she was able to have a bowel movement.  She denies any other chest pain. No exertional chest pain. Interestingly, she does state that when she was having the episode of chest discomfort last night, her heart rate shot up.  She denies any feeling of heart racing or palpitations but was made aware of this by her telemetry monitor and nursing staff.  Review of telemetry, heart rate got up into the 140s-it looks like possible SVT with underlying LBBB.  She denies any lightheadedness, dizziness, or syncope. She reports subjective fevers at home prior to admission and feeling very fatigued/ lethargic. She also reports some intermittent nausea at home but no vomiting.  No abnormal bleeding in urine or stools.  She has a history of tobacco use but states she quit about 5 years ago.  She has a questionable history of  COPD.  She was previously diagnosed with COPD in the hospital but states she later saw a pulmonologist who said she did not have COPD and that her symptoms are due to GERD.  Does have a family history of heart disease.  She states her paternal grandmother had heart disease but is unable to give specific details.  She states she also thinks that her father had heart disease.  Her father is deceased  and she states that initially they thought her father had been murdered but they were told he had a heart attack.  Past Medical History:  Diagnosis Date   Anemia    Anxiety    Arthritis    in both knees   Depression    GERD (gastroesophageal reflux disease)    Hyperlipidemia    Hypertension    Sleep apnea     Past Surgical History:  Procedure Laterality Date   CARPAL TUNNEL RELEASE Right    CATARACT EXTRACTION W/PHACO Right 02/19/2020   Procedure: CATARACT EXTRACTION PHACO AND INTRAOCULAR LENS PLACEMENT ([place]);  Surgeon: Baruch Goldmann, MD;  Location: [facility];  Service: Ophthalmology;  Laterality: Right;  CDE: 4.83   CESAREAN SECTION     cesarian  1990   COLONOSCOPY N/A 08/10/2016   Procedure: COLONOSCOPY;  Surgeon: Danie Binder, MD;  Location: [facility] SUITE;  Service: Endoscopy;  Laterality: N/A;  10:30 AM   fallopian tube removed N/A    TOTAL KNEE ARTHROPLASTY Left 03/18/2021   Procedure: TOTAL KNEE ARTHROPLASTY;  Surgeon: Carole Civil, MD;  Location: [facility];  Service: Orthopedics;  Laterality: Left;     Home Medications:  Prior to Admission medications   Medication Sig Start Date End Date Taking? Authorizing Provider  albuterol (PROVENTIL) (2.5 MG/3ML) 0.083% nebulizer solution Take 2.5 mg by nebulization every 4 (four) hours as needed for wheezing or shortness of breath. 09/25/21  Yes [provider]  clonazePAM (KLONOPIN) 1 MG tablet Take 1 mg by mouth 3 (three) times daily as needed for anxiety.   Yes [provider]  diclofenac Sodium (VOLTAREN) 1 % GEL Apply 2 g topically 3 (three) times daily as needed (pain).   Yes [provider]  fluticasone (FLONASE) 50 MCG/ACT nasal spray Place 1 spray into both nostrils daily. 02/12/22  Yes [provider]  ibuprofen (ADVIL) 200 MG tablet Take 400 mg by mouth every 6 (six) hours as needed for headache or mild pain.   Yes [provider]  ipratropium (ATROVENT) 0.02 % nebulizer solution  Take 0.25 mg by nebulization every 6 (six) hours as needed for wheezing or shortness of breath. 12/21/21  Yes [provider]  Ketotifen Fumarate (ALLERGY EYE DROPS OP) Place 1 drop into both eyes daily as needed (allergies).   Yes [provider]  Melatonin 10 MG TABS Take 20 mg by mouth at bedtime as needed (sleep).   Yes [provider]  telmisartan-hydrochlorothiazide (MICARDIS HCT) 80-25 MG tablet Take 1 tablet by mouth daily. 09/24/17  Yes Tanda Rockers, MD    Inpatient Medications: Scheduled Meds:  aspirin  81 mg Oral Daily   enoxaparin (LOVENOX) injection  50 mg Subcutaneous Q24H   guaiFENesin  600 mg Oral BID   ipratropium  0.5 mg Nebulization BID   levalbuterol  0.63 mg Nebulization BID   lisinopril  5 mg Oral Daily   methimazole  5 mg Oral BID   metoprolol tartrate  50 mg Oral BID   rosuvastatin  10 mg Oral Daily  sodium chloride flush  3 mL Intravenous Q12H   Continuous Infusions:  sodium chloride     ceFEPime (MAXIPIME) IV 2 g (02/17/22 7124)   doxycycline (VIBRAMYCIN) IV 100 mg (02/17/22 0906)   PRN Meds: sodium chloride, acetaminophen **OR** acetaminophen, albuterol, diltiazem, sodium chloride flush  Allergies:    Allergies  Allergen Reactions   Hydrocodone Nausea And Vomiting   Oxycodone Nausea And Vomiting    Codeine, hydrocodone, most pain pills.   Penicillins Itching, Nausea And Vomiting and Swelling    Has patient had a PCN reaction causing immediate rash, facial/tongue/throat swelling, SOB or lightheadedness with hypotension: Yes, around the eyes Has patient had a PCN reaction causing severe rash involving mucus membranes or skin necrosis: No Has patient had a PCN reaction that required hospitalization: No Has patient had a PCN reaction occurring within the last 10 years: No  If all of the above answers are "NO", then may proceed with Cephalosporin use.    Precedex [Dexmedetomidine Hcl In Nacl] Other (See Comments)    Increased  agitation/confusion in ICU [April 2018]   Seroquel [Quetiapine] Other (See Comments)    More agitated/confused per daughter [name] when given in the past.   Shellfish Allergy Itching    Social History:   Social History   Socioeconomic History   Marital status: Single    Spouse name: Not on file   Number of children: Not on file   Years of education: Not on file   Highest education level: Not on file  Occupational History   Occupation: CNA    Comment: [employer]  Tobacco Use   Smoking status: Former    Packs/day: 1.00    Years: 30.00    Total pack years: 30.00    Types: Cigarettes    Quit date: 09/27/2016    Years since quitting: 5.3   Smokeless tobacco: Never  Vaping Use   Vaping Use: Never used  Substance and Sexual Activity   Alcohol use: Yes    Comment: rarely.last used x 3 days   Drug use: Not Currently    Comment: hx of marijuana use   Sexual activity: Not on file  Other Topics Concern   Not on file  Social History Narrative   Right handed. Single.  3 kids.  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  Caffeine 3 16 oz coffee, 4 sodas   Social Determinants of Health   Financial Resource Strain: Low Risk  (06/07/2018)   Overall Financial Resource Strain (CARDIA)    Difficulty of Paying Living Expenses: Not hard at all  Food Insecurity: No Food Insecurity (06/07/2018)   Hunger Vital Sign    Worried About Running Out of Food in the Last Year: Never true    Ran Out of Food in the Last Year: Never true  Transportation Needs: No Transportation Needs (06/07/2018)   PRAPARE - Hydrologist (Medical): No    Lack of Transportation (Non-Medical): No  Physical Activity: Insufficiently Active (06/07/2018)   Exercise Vital Sign    Days of Exercise per Week: 1 day    Minutes of Exercise per Session: 20 min  Stress: Stress Concern Present (06/07/2018)   [place]    Feeling of Stress :  To some extent  Social Connections: Somewhat Isolated (06/07/2018)   Social Connection and Isolation Panel [NHANES]    Frequency of Communication with Friends and Family: Three times a week    Frequency  of Social Gatherings with Friends and Family: Three times a week    Attends Religious Services: 1 to 4 times per year    Active Member of Clubs or Organizations: No    Attends Archivist Meetings: Never    Marital Status: Never married  Intimate Partner Violence: Not At Risk (06/07/2018)   Humiliation, Afraid, Rape, and Kick questionnaire    Fear of Current or Ex-Partner: No    Emotionally Abused: No    Physically Abused: No    Sexually Abused: No    Family History:   Family History  Problem Relation Age of Onset   Cancer Mother        lung   Heart attack Father    Stroke Paternal Grandmother    Kidney failure Maternal Grandmother    Cancer Maternal Grandfather        lung   Seizures Brother    Sarcoidosis Sister      ROS:  Please see the history of present illness.  Review of Systems  Constitutional:  Positive for chills, fever and malaise/fatigue.  HENT:  Positive for congestion.   Respiratory:  Positive for cough and shortness of breath. Negative for sputum production.   Cardiovascular:  Positive for chest pain. Negative for palpitations, orthopnea, leg swelling and PND.  Gastrointestinal:  Positive for nausea. Negative for blood in stool, constipation, melena and vomiting.  Genitourinary:  Negative for hematuria.  Musculoskeletal:  Positive for joint pain (knee pain).  Neurological:  Negative for dizziness and loss of consciousness.  Endo/Heme/Allergies:  Does not bruise/bleed easily.  Psychiatric/Behavioral:  Substance abuse: prior smoking history - quit 5 years ago.      Physical Exam/Data:   Vitals:   02/16/22 1606 02/16/22 2021 02/17/22 0748 02/17/22 0856  BP: (!) 151/61   (!) 143/53  Pulse: 95  95 97  Resp: (!) '22  20 18  '$ Temp: 98.9 F (37.2 C)    98.2 F (36.8 C)  TempSrc: Oral   Oral  SpO2: 95% 98% 90% 95%  Weight:      Height:        Intake/Output Summary (Last 24 hours) at 02/17/2022 1134 Last data filed at 02/16/2022 2135 Gross per 24 hour  Intake 363 ml  Output --  Net 363 ml      02/13/2022    9:18 PM 03/13/2021    3:17 PM 03/10/2021    3:11 PM  Last 3 Weights  Weight (lbs) 199 lb 15.3 oz 200 lb 200 lb  Weight (kg) 90.7 kg 90.719 kg 90.719 kg     Body mass index is 39.05 kg/m.  General: 61 y.o. obese [place] female resting comfortably in no acute distress.  HEENT: Normocephalic and atraumatic. Sclera clear. Neck: Supple.  No JVD. Heart: RRR. Distinct S1 and S2. Soft I/VI systolic murmur.  Lungs: No increased work of breathing. Decreased breath sounds primarily in right base but otherwise clear to ausculation bilaterally. No significant wheezes, rhonchi, or rales appreciated. Abdomen: Soft, non-distended, and non-tender to palpation.  MSK: Normal strength and tone for age. Extremities: No lower extremity edema.    Skin: Warm and dry. Neuro: Alert and oriented x3. No focal deficits. Psych: Normal affect. Responds appropriately.   EKG:  The following EKGs were personally reviewed: - Initial EKG on 02/03/2022 showed sinus tachycardia, rate 127 bpm, with PACs and new LBBB.  Left axis deviation, QTc 520 ms. - EKG on 02/14/2022 showed sinus tachycardia, rate 112 bpm, with LBBB.  Normal  axis.  QTc 485 ms. - EKG on 02/15/2022 showed sinus tachycardia, rate 112 bpm, with PAC and LBBB.  Normal axis.  QTc 495 ms. - EKG on 02/16/2022 showed sinus tachycardia, rate 138 bpm, with LBBB.  Normal axis.  QTc 527 ms.  Telemetry:  Telemetry was personally reviewed and demonstrates:  Normal sinus rhythm with rates mostly in the 80s to 90s. She did have one episode of wide complex tachycardia with rates in the 140s last night around 11:15 that lasted for about 25 minutes - looks like possible SVT with underlying LBBB.  Relevant CV  Studies:  Echocardiogram 02/15/2022: Impressions:  1. Left ventricular ejection fraction, by estimation, is 40 to 45%. The  left ventricle has mildly decreased function. The left ventricle  demonstrates global hypokinesis. There is mild concentric left ventricular  hypertrophy. Left ventricular diastolic  parameters are consistent with Grade II diastolic dysfunction  (pseudonormalization). Elevated left atrial pressure.   2. Right ventricular systolic function is normal. The right ventricular  size is normal. Tricuspid regurgitation signal is inadequate for assessing  PA pressure.   3. Left atrial size was mildly dilated.   4. The mitral valve is normal in structure. Mild to moderate mitral valve  regurgitation.   5. The aortic valve is tricuspid. Aortic valve regurgitation is not  visualized.   Comparison(s): A prior study was performed on 10/12/2016. Prior images  unable to be directly viewed, comparison made by report only. The left  ventricular function is worsened. LBBB is also new since 2018.    Laboratory Data:  High Sensitivity Troponin:   Recent Labs  Lab 02/13/22 2146 02/13/22 2352 02/14/22 1956  TROPONINIHS 28* 27* 18*     Chemistry Recent Labs  Lab 02/14/22 1956 02/15/22 1834 02/16/22 0252 02/17/22 0303  NA 135 136 137 139  K 3.8 4.0 3.6 4.4  CL 101 101 102 106  CO2 '24 25 28 23  '$ GLUCOSE 120* 110* 115* 111*  BUN '20 18 19 15  '$ CREATININE 0.81 0.65 0.71 0.52  CALCIUM 9.0 8.9 8.8* 8.9  MG 2.3  --  2.2 2.0  GFRNONAA >60 >60 >60 >60  ANIONGAP '10 10 7 10    '$ Recent Labs  Lab 02/15/22 1834 02/16/22 0252 02/17/22 0303  PROT 7.1 7.1 6.7  ALBUMIN 2.8* 2.7* 2.6*  AST 34 36 35  ALT '21 23 27  '$ ALKPHOS 97 95 95  BILITOT 0.9 0.5 0.8   Lipids No results for input(s): "CHOL", "TRIG", "HDL", "LABVLDL", "LDLCALC", "CHOLHDL" in the last 168 hours.  Hematology Recent Labs  Lab 02/14/22 2213 02/16/22 0252 02/17/22 0303  WBC 10.0 6.6 6.1  RBC 3.88 3.83* 3.54*   HGB 10.9* 10.5* 9.8*  HCT 33.7* 33.8* 30.7*  MCV 86.9 88.3 86.7  MCH 28.1 27.4 27.7  MCHC 32.3 31.1 31.9  RDW 15.2 15.3 15.4  PLT 345 361 387   Thyroid  Recent Labs  Lab 02/14/22 2213  TSH 0.105*  FREET4 1.18*    BNP Recent Labs  Lab 02/14/22 0017 02/16/22 0252 02/17/22 0303  BNP 87.5 71.5 146.8*    DDimer No results for input(s): "DDIMER" in the last 168 hours.   Radiology/Studies:  ECHOCARDIOGRAM COMPLETE  Result Date: 02/15/2022    ECHOCARDIOGRAM REPORT   Patient Name:   KARLIN BINION Date of Exam: 02/15/2022 Medical Rec #:  [id number]          Height:       60.0 in Accession #:    [id number]  Weight:       200.0 lb Date of Birth:  07/29/1960           BSA:          1.866 m Patient Age:    64 years           BP:           117/63 mmHg Patient Gender: F                  HR:           90 bpm. Exam Location:  Inpatient Procedure: 2D Echo, Cardiac Doppler and Color Doppler Indications:    elevated Troponin  History:        Patient has prior history of Echocardiogram examinations, most                 recent 10/12/2016. CHF, COPD; Risk Factors:Hypertension and                 Dyslipidemia.  Sonographer:    Wenda Low Referring Phys: [id number] ANASTASSIA DOUTOVA  Sonographer Comments: Patient is obese. Image acquisition challenging due to respiratory motion. IMPRESSIONS  1. Left ventricular ejection fraction, by estimation, is 40 to 45%. The left ventricle has mildly decreased function. The left ventricle demonstrates global hypokinesis. There is mild concentric left ventricular hypertrophy. Left ventricular diastolic parameters are consistent with Grade II diastolic dysfunction (pseudonormalization). Elevated left atrial pressure.  2. Right ventricular systolic function is normal. The right ventricular size is normal. Tricuspid regurgitation signal is inadequate for assessing PA pressure.  3. Left atrial size was mildly dilated.  4. The mitral valve is normal in structure. Mild  to moderate mitral valve regurgitation.  5. The aortic valve is tricuspid. Aortic valve regurgitation is not visualized. Comparison(s): A prior study was performed on 10/12/2016. Prior images unable to be directly viewed, comparison made by report only. The left ventricular function is worsened. LBBB is also new since 2018. FINDINGS  Left Ventricle: Left ventricular ejection fraction, by estimation, is 40 to 45%. The left ventricle has mildly decreased function. The left ventricle demonstrates global hypokinesis. The left ventricular internal cavity size was normal in size. There is  mild concentric left ventricular hypertrophy. Abnormal (paradoxical) septal motion, consistent with left bundle branch block. Left ventricular diastolic parameters are consistent with Grade II diastolic dysfunction (pseudonormalization). Elevated left atrial pressure. Right Ventricle: The right ventricular size is normal. No increase in right ventricular wall thickness. Right ventricular systolic function is normal. Tricuspid regurgitation signal is inadequate for assessing PA pressure. Left Atrium: Left atrial size was mildly dilated. Right Atrium: Right atrial size was normal in size. Pericardium: There is no evidence of pericardial effusion. Mitral Valve: The mitral valve is normal in structure. Mild mitral annular calcification. Mild to moderate mitral valve regurgitation, with centrally-directed jet. MV peak gradient, 9.1 mmHg. The mean mitral valve gradient is 3.0 mmHg. Tricuspid Valve: The tricuspid valve is normal in structure. Tricuspid valve regurgitation is not demonstrated. Aortic Valve: The aortic valve is tricuspid. Aortic valve regurgitation is not visualized. Aortic valve mean gradient measures 5.0 mmHg. Aortic valve peak gradient measures 9.1 mmHg. Aortic valve area, by VTI measures 2.19 cm. Pulmonic Valve: The pulmonic valve was not well visualized. Pulmonic valve regurgitation is not visualized. Aorta: The aortic  root is normal in size and structure. IAS/Shunts: No atrial level shunt detected by color flow Doppler.  LEFT VENTRICLE PLAX 2D LVIDd:  4.60 cm     Diastology LVIDs:         3.30 cm     LV e' medial:    8.81 cm/s LV PW:         1.30 cm     LV E/e' medial:  14.7 LV IVS:        1.30 cm     LV e' lateral:   7.00 cm/s LVOT diam:     2.00 cm     LV E/e' lateral: 18.5 LV SV:         61 LV SV Index:   32 LVOT Area:     3.14 cm  LV Volumes (MOD) LV vol d, MOD A2C: 63.5 ml LV vol d, MOD A4C: 68.0 ml LV vol s, MOD A2C: 37.2 ml LV vol s, MOD A4C: 30.0 ml LV SV MOD A2C:     26.3 ml LV SV MOD A4C:     68.0 ml LV SV MOD BP:      35.9 ml RIGHT VENTRICLE RV Basal diam:  3.00 cm RV Mid diam:    2.10 cm RV S prime:     12.60 cm/s TAPSE (M-mode): 2.0 cm LEFT ATRIUM             Index        RIGHT ATRIUM           Index LA diam:        4.10 cm 2.20 cm/m   RA Area:     11.50 cm LA Vol (A2C):   76.5 ml 40.99 ml/m  RA Volume:   25.90 ml  13.88 ml/m LA Vol (A4C):   58.0 ml 31.08 ml/m LA Biplane Vol: 70.9 ml 37.99 ml/m  AORTIC VALVE                     PULMONIC VALVE AV Area (Vmax):    2.29 cm      PV Vmax:       0.89 m/s AV Area (Vmean):   1.83 cm      PV Peak grad:  3.1 mmHg AV Area (VTI):     2.19 cm AV Vmax:           151.00 cm/s AV Vmean:          106.000 cm/s AV VTI:            0.277 m AV Peak Grad:      9.1 mmHg AV Mean Grad:      5.0 mmHg LVOT Vmax:         110.00 cm/s LVOT Vmean:        61.900 cm/s LVOT VTI:          0.193 m LVOT/AV VTI ratio: 0.70  AORTA Ao Root diam: 3.40 cm MITRAL VALVE MV Area (PHT): 3.65 cm     SHUNTS MV Area VTI:   1.88 cm     Systemic VTI:  0.19 m MV Peak grad:  9.1 mmHg     Systemic Diam: 2.00 cm MV Mean grad:  3.0 mmHg MV Vmax:       1.51 m/s MV Vmean:      85.4 cm/s MV Decel Time: 208 msec MV E velocity: 129.50 cm/s MV A velocity: 120.00 cm/s MV E/A ratio:  1.08 Mihai Croitoru MD Electronically signed by Sanda Klein MD Signature Date/Time: 02/15/2022/2:33:58 PM    Final    DG Chest  Port 1 View  Result Date: 02/15/2022  CLINICAL DATA:  Shortness of breath. EXAM: PORTABLE CHEST 1 VIEW COMPARISON:  02/14/2022 at 7:37 p.m. FINDINGS: Lungs are hypoinflated demonstrate mild bibasilar opacification which may be due to atelectasis or infection. Possible small right effusion. Mild hazy prominence of the central pulmonary vessels suggesting a degree of vascular congestion. Mild stable cardiomegaly. Remainder of the exam is unchanged. IMPRESSION: 1. Hypoinflation with mild bibasilar opacification which may be due to atelectasis or infection. Possible small right effusion. 2. Mild stable cardiomegaly with suggestion of mild vascular congestion. Electronically Signed   By: Marin Olp M.D.   On: 02/15/2022 10:22   DG CHEST PORT 1 VIEW  Result Date: 02/14/2022 CLINICAL DATA:  Shortness of breath and cough. EXAM: PORTABLE CHEST 1 VIEW COMPARISON:  Chest radiograph dated 02/13/2022 and CT dated 02/15/2019. FINDINGS: Shallow inspiration. There is mild cardiomegaly with mild vascular congestion. Bibasilar atelectasis. Right lung base opacity seen on the earlier CT is suboptimally visualized on radiograph. No pleural effusion pneumothorax. Atherosclerotic calcification of the aortic arch. No acute osseous pathology. IMPRESSION: Mild cardiomegaly with mild vascular congestion. Electronically Signed   By: Anner Crete M.D.   On: 02/14/2022 19:54   CT Chest Wo Contrast  Result Date: 02/14/2022 CLINICAL DATA:  Pneumonia. EXAM: CT CHEST WITHOUT CONTRAST TECHNIQUE: Multidetector CT imaging of the chest was performed following the standard protocol without IV contrast. RADIATION DOSE REDUCTION: This exam was performed according to the departmental dose-optimization program which includes automated exposure control, adjustment of the mA and/or kV according to patient size and/or use of iterative reconstruction technique. COMPARISON:  Chest radiograph dated 02/13/2022. FINDINGS: Evaluation of this exam is  limited in the absence of intravenous contrast as well as due to respiratory motion. Cardiovascular: Top-normal cardiac size. No pericardial effusion. Three-vessel coronary vascular calcification. Mild atherosclerotic calcification of the thoracic aorta. No aneurysmal dilatation. The central pulmonary arteries are grossly unremarkable. Mediastinum/Nodes: No obvious hilar or mediastinal adenopathy. The esophagus and thyroid gland are grossly unremarkable. No mediastinal fluid collection. Lungs/Pleura: There is an area of consolidation in the right lower lobe most consistent with pneumonia. Clinical correlation and follow-up to resolution recommended. Linear atelectasis or scarring in the lingula. There is no pleural effusion or pneumothorax. The central airways are patent. Upper Abdomen: No acute abnormality. Musculoskeletal: Degenerative changes of the spine. No acute osseous pathology. IMPRESSION: 1. Right lower lobe pneumonia. Clinical correlation and follow-up to resolution recommended. 2. Aortic Atherosclerosis (ICD10-I70.0). Electronically Signed   By: Anner Crete M.D.   On: 02/14/2022 00:29   DG Chest Port 1 View  Result Date: 02/13/2022 CLINICAL DATA:  Cough. EXAM: PORTABLE CHEST 1 VIEW COMPARISON:  December 20, 2021 FINDINGS: The heart size and mediastinal contours are within normal limits. There is moderate severity calcification of the aortic arch. Low lung volumes are noted. Mild areas of linear scarring and/or atelectasis are seen within the bilateral lung bases. There is no evidence of an acute infiltrate, pleural effusion or pneumothorax. The visualized skeletal structures are unremarkable. IMPRESSION: Mild bibasilar linear scarring and/or atelectasis. Electronically Signed   By: Virgina Norfolk M.D.   On: 02/13/2022 23:17     Assessment and Plan:   New LBBB New HFrEF  Patient was admitted with acute hypoxic respiratory failure and sepsis secondary to pneumonia and was found to have a new  LBBB on EKG and new cardiomyopathy with mildly reduced LVEF. High-sensitivity troponin minimally elevated and flat in the 20s not consistent consistent with ACS (consistent with demand ischemia from acute illness). Echo  showed LVEF of 40-45% with global hypokinesis mild LVH, and grade 2 diastolic dysfunction. BNP was initial normal at 87.5 on admission but now mildly elevated at 146.8. Initial x-ray showed no evidence of edema. Repeat chest x-ray on 8/20 showed mild stable cardiomegaly with suggestion of mild vascular congestion. Net positive 1.87 L this admission. - She reports very atypical chest pain that describes as "trapped gas" that improves after having a bowel movement but nothing that sounds like angina. - Euvolemic on exam. She received one dose of IV Lasix on 8/19. I don't think she needs any more IV diuresis at this time. - Currently on Lopressor '50mg'$  twice daily. Ok to continue this for now but would transition to Toprol-XL prior to discharge given reduced EF. - On Telmisartan-HCTZ at home but this was stopped on admission given renal function. She was restarted on Lisinopril today. I would favor ARB over ACEi in case we end up transitioning to [place] down the road. Will stop Lisinopril and start Losartan '25mg'$  daily tomorrow. - Will add low dose Spironolactone 12.'5mg'$  daily. - Continue to monitor daily weights, strict I/Os, and renal function. - I do think patient warrants an ischemic evaluation given new LBBB and mildly reduced EF.Given acute infection, I think a coronary CTA is a good option. Will discuss timing of this with MD.  Suspected SVT Patient had about a 25 minute episode of wide complex tachycardia last night with rates in the 140s. It looks like likely SVT/ atrial tachycardia with underlying LBBB. She had some mild chest discomfort around this time that she described as "trapped gas" but was otherwise asymptomatic. She came out of this on her own. - Continue beta-blocker as  above. - Continue to monitor on telemetry.  Hypertension BP mildly elevated at times. - Will manage this in context of her CHF as above.  Hyperlipidemia History of hyperlipidemia listed in chart but does not appear to be on any medications at home. - Started on Crestor '10mg'$  daily today by primary team. Agree with this. - Will check a fasting lipid panel.  AKI - Resolved Creatinine 1.24 on admission. Baseline around 0.5 to 0.6.  - Now resolved. Creatinine 0.52 today.  Otherwise, per primary team: - Community acquired pneumonia - COPD - Hyperthyroidism - Hyponatremia - resolved  Risk Assessment/Risk Scores:   New York Heart Association (NYHA) Functional Class NYHA Class II   For questions or updates, please contact [facility] Please consult [email or web address] for contact info under    Signed, Eppie Gibson  02/17/2022 11:34 AM  Patient seen and examined with Sande Rives PA-C.  Agree as above, with the following exceptions and changes as noted below.  Patient was admitted with fevers, chills, cough and shortness of breath that had been progressing over the last several weeks.  She was febrile to 101.5 with hypoxia.  EKG noted a new left bundle branch block as well as sinus tachycardia.  Left bundle branch block is new and echocardiogram shows mildly reduced ejection fraction.  She denies significant chest discomfort aside from a possible cardiac symptom of feeling a bubble in her chest that gets better with bowel movement. gen: NAD, CV: RRR, no murmurs, Lungs: clear, Abd: soft, Extrem: Warm, well perfused, no edema, Neuro/Psych: alert and oriented x 3, normal mood and affect. All available labs, radiology testing, previous records reviewed.  We discussed in detail that ischemic evaluation is indicated for new left bundle branch block as well as mildly reduced ejection fraction.  I have independently reviewed the echo images from 02/15/2022 and agree that the ejection  fraction looks mildly reduced.  I have also reviewed the echo images from 10/12/2016 where the LV ejection fraction actually appears 65 to 70% with hyperdynamic function, therefore echo this admission certainly represents a clinical change.  This in light of left bundle branch block warrants further evaluation.  Patient would like to have her scan prior to dismissal we will arrange this for tomorrow, I have discussed this with CT imaging team.  She will receive metoprolol and ivabradine for rate control prior to scan given rates in the 80s and 90s currently.  If we are unable to complete a diagnostic coronary CTA, we may want to consider cardiac catheterization versus outpatient nuclear stress test.  Patient no longer appears toxic and does not appear septic and would be stable for further evaluation.  Elouise Munroe, MD 02/17/22 2:45 PM

## 2022-02-17 NOTE — TOC Initial Note (Signed)
Transition of Care [facility]) - Initial/Assessment Note    Patient Details  Name: Rachel [name] MRN: [id number] Date of Birth: Sep 15, 1960  [facility] CM/SW Contact:    Carles Collet, RN Phone Number: 02/17/2022, 11:26 AM  Clinical Narrative:                 Damaris Schooner w patient at bedside. She was resting comfortably on RA. We discussed portential need for home oxygen based on ambulatory sats. She is agreeable for [employer] to furnish home oxygen if ordered.  She states that she plans to return home and her daughter will be able to pick her up after work on day of DC. Anticipate DC tomorrow once cleared by cardiology.   Needs potential DME O2 order, and referral called to [employer]  Expected Discharge Plan: Home/Self Care Barriers to Discharge: Continued Medical Work up   Patient Goals and CMS Choice Patient states their goals for this hospitalization and ongoing recovery are:: to go home CMS Medicare.gov Compare Post Acute Care list provided to:: Patient Choice offered to / list presented to : Patient  Expected Discharge Plan and Services Expected Discharge Plan: Home/Self Care   Discharge Planning Services: CM Consult Post Acute Care Choice: Durable Medical Equipment Living arrangements for the past 2 months: Apartment                           HH Arranged: NA          Prior Living Arrangements/Services Living arrangements for the past 2 months: Apartment Lives with:: Self   Do you feel safe going back to the place where you live?: Yes               Activities of Daily Living Home Assistive Devices/Equipment: None ADL Screening (condition at time of admission) Patient's cognitive ability adequate to safely complete daily activities?: Yes Is the patient deaf or have difficulty hearing?: No Does the patient have difficulty seeing, even when wearing glasses/contacts?: No Does the patient have difficulty concentrating, remembering, or making decisions?:  No Patient able to express need for assistance with ADLs?: Yes Does the patient have difficulty dressing or bathing?: No Independently performs ADLs?: Yes (appropriate for developmental age) Does the patient have difficulty walking or climbing stairs?: Yes Weakness of Legs: Both Weakness of Arms/Hands: Both  Permission Sought/Granted                  Emotional Assessment              Admission diagnosis:  Pneumonia [J18.9] Community acquired pneumonia, unspecified laterality [J18.9] Sepsis, due to unspecified organism, unspecified whether acute organ dysfunction present [facility]) [A41.9] Patient Active Problem List   Diagnosis Date Noted   Hypothyroidism 02/15/2022   Hyponatremia 02/14/2022   Elevated troponin 02/14/2022   Severe sepsis ([place]) 02/14/2022   Prolonged QT interval 02/14/2022   AKI (acute kidney injury) ([place]) 02/14/2022   Chronic diastolic CHF (congestive heart failure) ([place]) 02/14/2022   Pneumonia 02/13/2022   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 03/18/2021   Primary osteoarthritis of left knee    Abnormal thyroid blood test 08/17/2018   Upper airway cough syndrome 10/11/2017   COPD GOLD 0  08/12/2017   Morbid obesity ([place]) 08/12/2017   Acute on chronic respiratory failure with hypoxemia ([place])    ARDS (adult respiratory distress syndrome) ([place])    Influenza B 10/10/2016   Special screening for malignant neoplasms, colon  HYPERCHOLESTEROLEMIA 08/03/2009   BIPOLAR DISORDER UNSPECIFIED 08/03/2009   OBSESSIVE-COMPULSIVE DISORDER 08/03/2009   Essential hypertension, benign 08/03/2009   KNEE PAIN, BILATERAL 08/03/2009   PCP:  Redmond School, MD Pharmacy:   [facility] [phone] - [place], [address] [place] [address] Phone: [phone] Fax: [phone]  [id number] [place] [place]: [phone]: [phone] [facility] [address]Phone: [phone]: [phone]    Social Determinants of Health (SDOH) Interventions    Readmission Risk Interventions     No data to display

## 2022-02-18 ENCOUNTER — Inpatient Hospital Stay (HOSPITAL_COMMUNITY): Payer: 59

## 2022-02-18 DIAGNOSIS — N179 Acute kidney failure, unspecified: Secondary | ICD-10-CM | POA: Diagnosis not present

## 2022-02-18 DIAGNOSIS — I700 Atherosclerosis of aorta: Secondary | ICD-10-CM

## 2022-02-18 DIAGNOSIS — J189 Pneumonia, unspecified organism: Secondary | ICD-10-CM | POA: Diagnosis not present

## 2022-02-18 DIAGNOSIS — J9621 Acute and chronic respiratory failure with hypoxia: Secondary | ICD-10-CM | POA: Diagnosis not present

## 2022-02-18 LAB — CBC WITH DIFFERENTIAL/PLATELET
Abs Immature Granulocytes: 0.06 10*3/uL (ref 0.00–0.07)
Basophils Absolute: 0 10*3/uL (ref 0.0–0.1)
Basophils Relative: 0 %
Eosinophils Absolute: 0.2 10*3/uL (ref 0.0–0.5)
Eosinophils Relative: 2 %
HCT: 30.1 % — ABNORMAL LOW (ref 36.0–46.0)
Hemoglobin: 9.8 g/dL — ABNORMAL LOW (ref 12.0–15.0)
Immature Granulocytes: 1 %
Lymphocytes Relative: 20 %
Lymphs Abs: 1.6 10*3/uL (ref 0.7–4.0)
MCH: 27.8 pg (ref 26.0–34.0)
MCHC: 32.6 g/dL (ref 30.0–36.0)
MCV: 85.3 fL (ref 80.0–100.0)
Monocytes Absolute: 0.6 10*3/uL (ref 0.1–1.0)
Monocytes Relative: 7 %
Neutro Abs: 5.8 10*3/uL (ref 1.7–7.7)
Neutrophils Relative %: 70 %
Platelets: 463 10*3/uL — ABNORMAL HIGH (ref 150–400)
RBC: 3.53 MIL/uL — ABNORMAL LOW (ref 3.87–5.11)
RDW: 15.5 % (ref 11.5–15.5)
WBC: 8.3 10*3/uL (ref 4.0–10.5)
nRBC: 0 % (ref 0.0–0.2)

## 2022-02-18 LAB — COMPREHENSIVE METABOLIC PANEL
ALT: 23 U/L (ref 0–44)
AST: 20 U/L (ref 15–41)
Albumin: 2.7 g/dL — ABNORMAL LOW (ref 3.5–5.0)
Alkaline Phosphatase: 83 U/L (ref 38–126)
Anion gap: 8 (ref 5–15)
BUN: 13 mg/dL (ref 6–20)
CO2: 25 mmol/L (ref 22–32)
Calcium: 9.2 mg/dL (ref 8.9–10.3)
Chloride: 105 mmol/L (ref 98–111)
Creatinine, Ser: 0.52 mg/dL (ref 0.44–1.00)
GFR, Estimated: >60 mL/min (ref >60)
Glucose, Bld: 107 mg/dL — ABNORMAL HIGH (ref 70–99)
Potassium: 4.1 mmol/L (ref 3.5–5.1)
Sodium: 138 mmol/L (ref 135–145)
Total Bilirubin: 0.7 mg/dL (ref 0.3–1.2)
Total Protein: 6.4 g/dL — ABNORMAL LOW (ref 6.5–8.1)

## 2022-02-18 LAB — C-REACTIVE PROTEIN: CRP: 15.7 mg/dL — ABNORMAL HIGH (ref <1.0)

## 2022-02-18 LAB — BRAIN NATRIURETIC PEPTIDE: B Natriuretic Peptide: 189.8 pg/mL — ABNORMAL HIGH (ref 0.0–100.0)

## 2022-02-18 LAB — PROCALCITONIN: Procalcitonin: 0.12 ng/mL

## 2022-02-18 LAB — MAGNESIUM: Magnesium: 2 mg/dL (ref 1.7–2.4)

## 2022-02-18 MED ORDER — IOHEXOL 350 MG/ML SOLN
200.0000 mL | Freq: Once | INTRAVENOUS | Status: AC | PRN
  Administered 2022-02-18: 200 mL via INTRAVENOUS

## 2022-02-18 MED ORDER — IVABRADINE HCL 5 MG PO TABS
10.0000 mg | ORAL_TABLET | Freq: Once | ORAL | Status: AC
  Administered 2022-02-18: 10 mg via ORAL
  Filled 2022-02-18: qty 2

## 2022-02-18 MED ORDER — NITROGLYCERIN 0.4 MG SL SUBL
SUBLINGUAL_TABLET | SUBLINGUAL | Status: AC
  Filled 2022-02-18: qty 2

## 2022-02-18 MED ORDER — METOPROLOL TARTRATE 100 MG PO TABS
100.0000 mg | ORAL_TABLET | Freq: Once | ORAL | Status: AC
  Administered 2022-02-18: 100 mg via ORAL
  Filled 2022-02-18: qty 1

## 2022-02-18 MED ORDER — METOPROLOL TARTRATE 5 MG/5ML IV SOLN
INTRAVENOUS | Status: AC
  Filled 2022-02-18: qty 10

## 2022-02-18 NOTE — Progress Notes (Signed)
SATURATION QUALIFICATIONS: (This note is used to comply with regulatory documentation for home oxygen)  Patient Saturations on Room Air at Rest = 97%  Patient Saturations on Room Air while Ambulating = 94%   Please briefly explain why patient needs home oxygen: N/A, SpO2 within functional limits on room air during household distance ambulation today.

## 2022-02-18 NOTE — Progress Notes (Addendum)
Physical Therapy Treatment Patient Details Name: Rachel [name] MRN: [id number] DOB: March 25, 1961 Today's Date: 02/18/2022   History of Present Illness 61 y.o. F admitted on 02/15/22 due to SoB, fatigue, and cough. Pt found to have Pneumonia. PMH significant for COPD, HTN, HLD, CHF, OSA.    PT Comments    Pt received seated EOB, agreeable to therapy session and with good participation and tolerance for gait progression in hallway on room air. VSS on RA other than HR which was briefly elevated to 143 bpm, improves to 80's bpm with brief seated break, emphasis on activity pacing and energy conservation. No loss of balance without AD. Pt continues to benefit from PT services to progress toward functional mobility goals. Plan to initiate stair training next session.   Recommendations for follow up therapy are one component of a multi-disciplinary discharge planning process, led by the attending physician.  Recommendations may be updated based on patient status, additional functional criteria and insurance authorization.  Follow Up Recommendations  No PT follow up     Assistance Recommended at Discharge PRN  Patient can return home with the following     Equipment Recommendations  None recommended by PT    Recommendations for Other Services       Precautions / Restrictions Precautions Precaution Comments: watch O2 sats/HR Restrictions Weight Bearing Restrictions: No     Mobility  Bed Mobility               General bed mobility comments: sitting EOB pre/post    Transfers Overall transfer level: Modified independent Equipment used: None               General transfer comment: No assist    Ambulation/Gait Ambulation/Gait assistance: Supervision Gait Distance (Feet): 120 Feet (x2 trials with seated break) Assistive device: None Gait Pattern/deviations: Step-through pattern, Shuffle Gait velocity: decreased     General Gait Details: SpO2 WFL on RA throughout;  HR elevated to 143 bpm with brief reading of Vtach on monitor for a few seconds, pt reports feeling HR mildly elevated but no other sx; seated break and afterward HR remains below 120 bpm (mostly 80s'-90's bpm)   Stairs Stairs:  (pt defers, reviewed acivity pacing with stairs)            Balance Overall balance assessment: No apparent balance deficits (not formally assessed)                                          Cognition Arousal/Alertness: Awake/alert Behavior During Therapy: WFL for tasks assessed/performed Overall Cognitive Status: Within Functional Limits for tasks assessed         General Comments: Continued decreased navigational awareness/increased time to re-orient to find room when ambulating in hallway.           General Comments General comments (skin integrity, edema, etc.): HR max 143bpm      Pertinent Vitals/Pain Pain Assessment Pain Assessment: No/denies pain     PT Goals (current goals can now be found in the care plan section) Acute Rehab PT Goals Patient Stated Goal: to get stronger and go home. PT Goal Formulation: With patient Time For Goal Achievement: 02/22/22 Progress towards PT goals: Progressing toward goals    Frequency    Min 3X/week      PT Plan Current plan remains appropriate       AM-PAC PT "6 Clicks" Mobility  Outcome Measure  Help needed turning from your back to your side while in a flat bed without using bedrails?: None Help needed moving from lying on your back to sitting on the side of a flat bed without using bedrails?: None Help needed moving to and from a bed to a chair (including a wheelchair)?: None Help needed standing up from a chair using your arms (e.g., wheelchair or bedside chair)?: None Help needed to walk in hospital room?: None Help needed climbing 3-5 steps with a railing? : A Little 6 Click Score: 23    End of Session Equipment Utilized During Treatment: Gait belt Activity  Tolerance: Patient tolerated treatment well Patient left: with call bell/phone within reach;in bed;Other (comment) (pt sitting EOB) Nurse Communication: Mobility status;Other (comment) (walking O2 sats) PT Visit Diagnosis: Other abnormalities of gait and mobility (R26.89) (decr functional capacity)     Time: 0865-7846 PT Time Calculation (min) (ACUTE ONLY): 21 min  Charges:  $Therapeutic Activity: 8-22 mins                     Raghav Verrilli P., PTA Acute Rehabilitation Services Secure Chat Preferred 9a-5:30pm Office: [place] 02/18/2022, 2:35 PM

## 2022-02-18 NOTE — Progress Notes (Signed)
Progress Note  Patient Name: Rachel [name] Date of Encounter: 02/18/2022  [facility] Cardiologist: None   Subjective   Very nervous about CCTA, discussed at length. No CP or SOB today.  Inpatient Medications    Scheduled Meds:  aspirin  81 mg Oral Daily   enoxaparin (LOVENOX) injection  50 mg Subcutaneous Q24H   guaiFENesin  600 mg Oral BID   ipratropium  0.5 mg Nebulization BID   ivabradine  10 mg Oral Once   levalbuterol  0.63 mg Nebulization BID   losartan  25 mg Oral Daily   methimazole  5 mg Oral BID   metoprolol tartrate  100 mg Oral Once   metoprolol tartrate  50 mg Oral BID   rosuvastatin  10 mg Oral Daily   sodium chloride flush  3 mL Intravenous Q12H   spironolactone  12.5 mg Oral Daily   Continuous Infusions:  sodium chloride     ceFEPime (MAXIPIME) IV 2 g (02/18/22 0609)   doxycycline (VIBRAMYCIN) IV 100 mg (02/17/22 2009)   PRN Meds: sodium chloride, acetaminophen **OR** acetaminophen, albuterol, diltiazem, sodium chloride flush   Vital Signs    Vitals:   02/17/22 2026 02/17/22 2243 02/18/22 0739 02/18/22 0836  BP:  137/74  (!) 145/63  Pulse: 95 92  89  Resp: '19 18  17  '$ Temp:  98 F (36.7 C)  98 F (36.7 C)  TempSrc:  Oral  Oral  SpO2: 100% 100% 100%   Weight:      Height:        Intake/Output Summary (Last 24 hours) at 02/18/2022 0935 Last data filed at 02/17/2022 1629 Gross per 24 hour  Intake 915.35 ml  Output --  Net 915.35 ml      02/13/2022    9:18 PM 03/13/2021    3:17 PM 03/10/2021    3:11 PM  Last 3 Weights  Weight (lbs) 199 lb 15.3 oz 200 lb 200 lb  Weight (kg) 90.7 kg 90.719 kg 90.719 kg      Telemetry    SR lbbb 1st deg avb - Personally Reviewed  ECG    No new - Personally Reviewed  Physical Exam   GEN: No acute distress.   Neck: No JVD Cardiac: RRR, no murmurs, rubs, or gallops.  Respiratory: Clear to auscultation bilaterally. GI: Soft, nontender, non-distended  MS: No edema; No deformity. Neuro:   Nonfocal  Psych: Normal affect   Labs    High Sensitivity Troponin:   Recent Labs  Lab 02/13/22 2146 02/13/22 2352 02/14/22 1956  TROPONINIHS 28* 27* 18*     Chemistry Recent Labs  Lab 02/16/22 0252 02/17/22 0303 02/18/22 0506  NA 137 139 138  K 3.6 4.4 4.1  CL 102 106 105  CO2 '28 23 25  '$ GLUCOSE 115* 111* 107*  BUN '19 15 13  '$ CREATININE 0.71 0.52 0.52  CALCIUM 8.8* 8.9 9.2  MG 2.2 2.0 2.0  PROT 7.1 6.7 6.4*  ALBUMIN 2.7* 2.6* 2.7*  AST 36 35 20  ALT '23 27 23  '$ ALKPHOS 95 95 83  BILITOT 0.5 0.8 0.7  GFRNONAA >60 >60 >60  ANIONGAP '7 10 8    '$ Lipids No results for input(s): "CHOL", "TRIG", "HDL", "LABVLDL", "LDLCALC", "CHOLHDL" in the last 168 hours.  Hematology Recent Labs  Lab 02/16/22 0252 02/17/22 0303 02/18/22 0506  WBC 6.6 6.1 8.3  RBC 3.83* 3.54* 3.53*  HGB 10.5* 9.8* 9.8*  HCT 33.8* 30.7* 30.1*  MCV 88.3 86.7 85.3  MCH 27.4 27.7 27.8  MCHC 31.1 31.9 32.6  RDW 15.3 15.4 15.5  PLT 361 387 463*   Thyroid  Recent Labs  Lab 02/14/22 2213  TSH 0.105*  FREET4 1.18*    BNP Recent Labs  Lab 02/16/22 0252 02/17/22 0303 02/18/22 0506  BNP 71.5 146.8* 189.8*    DDimer No results for input(s): "DDIMER" in the last 168 hours.   Radiology    ECHOCARDIOGRAM COMPLETE  Result Date: 02/15/2022    ECHOCARDIOGRAM REPORT   Patient Name:   NIL BOLSER Date of Exam: 02/15/2022 Medical Rec #:  [id number]          Height:       60.0 in Accession #:    [id number]         Weight:       200.0 lb Date of Birth:  1961/03/05           BSA:          1.866 m Patient Age:    61 years           BP:           117/63 mmHg Patient Gender: F                  HR:           90 bpm. Exam Location:  Inpatient Procedure: 2D Echo, Cardiac Doppler and Color Doppler Indications:    elevated Troponin  History:        Patient has prior history of Echocardiogram examinations, most                 recent 10/12/2016. CHF, COPD; Risk Factors:Hypertension and                 Dyslipidemia.   Sonographer:    Wenda Low Referring Phys: [id number] ANASTASSIA DOUTOVA  Sonographer Comments: Patient is obese. Image acquisition challenging due to respiratory motion. IMPRESSIONS  1. Left ventricular ejection fraction, by estimation, is 40 to 45%. The left ventricle has mildly decreased function. The left ventricle demonstrates global hypokinesis. There is mild concentric left ventricular hypertrophy. Left ventricular diastolic parameters are consistent with Grade II diastolic dysfunction (pseudonormalization). Elevated left atrial pressure.  2. Right ventricular systolic function is normal. The right ventricular size is normal. Tricuspid regurgitation signal is inadequate for assessing PA pressure.  3. Left atrial size was mildly dilated.  4. The mitral valve is normal in structure. Mild to moderate mitral valve regurgitation.  5. The aortic valve is tricuspid. Aortic valve regurgitation is not visualized. Comparison(s): A prior study was performed on 10/12/2016. Prior images unable to be directly viewed, comparison made by report only. The left ventricular function is worsened. LBBB is also new since 2018. FINDINGS  Left Ventricle: Left ventricular ejection fraction, by estimation, is 40 to 45%. The left ventricle has mildly decreased function. The left ventricle demonstrates global hypokinesis. The left ventricular internal cavity size was normal in size. There is  mild concentric left ventricular hypertrophy. Abnormal (paradoxical) septal motion, consistent with left bundle branch block. Left ventricular diastolic parameters are consistent with Grade II diastolic dysfunction (pseudonormalization). Elevated left atrial pressure. Right Ventricle: The right ventricular size is normal. No increase in right ventricular wall thickness. Right ventricular systolic function is normal. Tricuspid regurgitation signal is inadequate for assessing PA pressure. Left Atrium: Left atrial size was mildly dilated. Right  Atrium: Right atrial size was normal in size. Pericardium: There is no evidence of  pericardial effusion. Mitral Valve: The mitral valve is normal in structure. Mild mitral annular calcification. Mild to moderate mitral valve regurgitation, with centrally-directed jet. MV peak gradient, 9.1 mmHg. The mean mitral valve gradient is 3.0 mmHg. Tricuspid Valve: The tricuspid valve is normal in structure. Tricuspid valve regurgitation is not demonstrated. Aortic Valve: The aortic valve is tricuspid. Aortic valve regurgitation is not visualized. Aortic valve mean gradient measures 5.0 mmHg. Aortic valve peak gradient measures 9.1 mmHg. Aortic valve area, by VTI measures 2.19 cm. Pulmonic Valve: The pulmonic valve was not well visualized. Pulmonic valve regurgitation is not visualized. Aorta: The aortic root is normal in size and structure. IAS/Shunts: No atrial level shunt detected by color flow Doppler.  LEFT VENTRICLE PLAX 2D LVIDd:         4.60 cm     Diastology LVIDs:         3.30 cm     LV e' medial:    8.81 cm/s LV PW:         1.30 cm     LV E/e' medial:  14.7 LV IVS:        1.30 cm     LV e' lateral:   7.00 cm/s LVOT diam:     2.00 cm     LV E/e' lateral: 18.5 LV SV:         61 LV SV Index:   32 LVOT Area:     3.14 cm  LV Volumes (MOD) LV vol d, MOD A2C: 63.5 ml LV vol d, MOD A4C: 68.0 ml LV vol s, MOD A2C: 37.2 ml LV vol s, MOD A4C: 30.0 ml LV SV MOD A2C:     26.3 ml LV SV MOD A4C:     68.0 ml LV SV MOD BP:      35.9 ml RIGHT VENTRICLE RV Basal diam:  3.00 cm RV Mid diam:    2.10 cm RV S prime:     12.60 cm/s TAPSE (M-mode): 2.0 cm LEFT ATRIUM             Index        RIGHT ATRIUM           Index LA diam:        4.10 cm 2.20 cm/m   RA Area:     11.50 cm LA Vol (A2C):   76.5 ml 40.99 ml/m  RA Volume:   25.90 ml  13.88 ml/m LA Vol (A4C):   58.0 ml 31.08 ml/m LA Biplane Vol: 70.9 ml 37.99 ml/m  AORTIC VALVE                     PULMONIC VALVE AV Area (Vmax):    2.29 cm      PV Vmax:       0.89 m/s AV Area  (Vmean):   1.83 cm      PV Peak grad:  3.1 mmHg AV Area (VTI):     2.19 cm AV Vmax:           151.00 cm/s AV Vmean:          106.000 cm/s AV VTI:            0.277 m AV Peak Grad:      9.1 mmHg AV Mean Grad:      5.0 mmHg LVOT Vmax:         110.00 cm/s LVOT Vmean:        61.900 cm/s LVOT VTI:  0.193 m LVOT/AV VTI ratio: 0.70  AORTA Ao Root diam: 3.40 cm MITRAL VALVE MV Area (PHT): 3.65 cm     SHUNTS MV Area VTI:   1.88 cm     Systemic VTI:  0.19 m MV Peak grad:  9.1 mmHg     Systemic Diam: 2.00 cm MV Mean grad:  3.0 mmHg MV Vmax:       1.51 m/s MV Vmean:      85.4 cm/s MV Decel Time: 208 msec MV E velocity: 129.50 cm/s MV A velocity: 120.00 cm/s MV E/A ratio:  1.08 Mihai Croitoru MD Electronically signed by Sanda Klein MD Signature Date/Time: 02/15/2022/2:33:58 PM    Final    DG Chest Port 1 View  Result Date: 02/15/2022 CLINICAL DATA:  Shortness of breath. EXAM: PORTABLE CHEST 1 VIEW COMPARISON:  02/14/2022 at 7:37 p.m. FINDINGS: Lungs are hypoinflated demonstrate mild bibasilar opacification which may be due to atelectasis or infection. Possible small right effusion. Mild hazy prominence of the central pulmonary vessels suggesting a degree of vascular congestion. Mild stable cardiomegaly. Remainder of the exam is unchanged. IMPRESSION: 1. Hypoinflation with mild bibasilar opacification which may be due to atelectasis or infection. Possible small right effusion. 2. Mild stable cardiomegaly with suggestion of mild vascular congestion. Electronically Signed   By: Marin Olp M.D.   On: 02/15/2022 10:22   DG CHEST PORT 1 VIEW  Result Date: 02/14/2022 CLINICAL DATA:  Shortness of breath and cough. EXAM: PORTABLE CHEST 1 VIEW COMPARISON:  Chest radiograph dated 02/13/2022 and CT dated 02/15/2019. FINDINGS: Shallow inspiration. There is mild cardiomegaly with mild vascular congestion. Bibasilar atelectasis. Right lung base opacity seen on the earlier CT is suboptimally visualized on radiograph. No  pleural effusion pneumothorax. Atherosclerotic calcification of the aortic arch. No acute osseous pathology. IMPRESSION: Mild cardiomegaly with mild vascular congestion. Electronically Signed   By: Anner Crete M.D.   On: 02/14/2022 19:54     Cardiac Studies   Echo as above, CCTA pending  Patient Profile     61 y.o. female with a history of grade 2 diastolic dysfunction on Echo in 2018, hypertension, hyperlipidemia, COPD, history of sleep apnea, and GERD who is being seen for the evaluation of new cardiomyopathy and new LBBB with mildly reduced EF .  Assessment & Plan    Principal Problem:   Pneumonia Active Problems:   HYPERCHOLESTEROLEMIA   Essential hypertension, benign   Acute on chronic respiratory failure with hypoxemia (HCC)   Hyponatremia   Elevated troponin   Severe sepsis (HCC)   Prolonged QT interval   AKI (acute kidney injury) (HCC)   Chronic diastolic CHF (congestive heart failure) (HCC)   Hypothyroidism  New LBBB and HFmrEF - will plan for CCTA today for ischemic workup. She has LM calcifications from a CT from 2018, suspect she will have disease but will determine if contributing to reduced EF with CCTA results. - received lisinopril last dose 1139 on 8/22. Will give losartan today and then can consider entresto homegoing or at close follow up.  - will need to transition to Toprol XL prior to discharge. Getting BB for CTA today.  - starting spironolactone today.  - will consider SGLT2I likely at close follow up  Patient is very worried and nervous about cardiovascular diagnoses. Will need continue conversations to help understand indications for medication therapy and workup if results of CCTA are abnormal.       For questions or updates, please contact [place] Please consult [email or web address] for  contact info under        Signed, Elouise Munroe, MD  02/18/2022, 9:35 AM

## 2022-02-18 NOTE — Progress Notes (Signed)
PROGRESS NOTE                                                                                                                                                                                                             Patient Demographics:    Rachel [name], is a 61 y.o. female, DOB - 1961-01-01, PTW:[id number]  Outpatient Primary MD for the patient is Redmond School, MD    LOS - 3  Admit date - 02/13/2022    Chief Complaint  Patient presents with   Cough   Atrial Fibrillation       Brief Narrative (HPI from H&P)   61 y.o. female with medical history significant of COPD,? HTN, HLD, diastolic CHF, OSA, obesity who presented to the hospital with cough, shortness of breath, excessive fatigue over the last 1 week.  Work-up in the ER consistent with pneumonia and she was admitted for further care.   Subjective:   She denies any chest pain, fever or chills, reports dyspnea has improved   Assessment  & Plan :    Acute hypoxic respiratory failure, sepsis due to community-acquired pneumonia.  She has been appropriately placed on IV fluids, IV antibiotics, sepsis pathophysiology is resolving, continue to follow cultures continue supportive care, encouraged to sit up in chair use I-S and flutter valve in daytime for pulmonary toiletry.   Hyperthyroidism.  Placed on methimazole.   HYPERCHOLESTEROLEMIA - Chronic stable continue Crestor 5 mg p.o. daily   Essential hypertension, benign - blood pressure improved placed on beta-blocker.   Hyponatremia - due to dehydration resolved with IV fluids.   Elevated troponin - due to demand mismatch from hypoxia, trend is flat and in non-ACS pattern, continue to monitor.  Placed on aspirin, check echocardiogram to evaluate wall motion.  Already on beta-blocker and statin for secondary prevention.  Chest pain-free.  No events since she has a new left bundle branch block will get an  echocardiogram.   Systolic heart failure new diagnosis.   Left bundle branch block -  previous echo few years ago had a EF of 60%.  EF has not dropped to 40% with global hypokinesis and new left bundle branch block.  He is currently compensated on beta-blocker statin, aspirin we will add low-dose ACE inhibitor and get cardiology input. -Urology input greatly appreciated, CT coronary today significant for distal  left main, proximal RCA, and proximal LAD disease.  He will need a cardiac cath, likely tomorrow.       Condition - Extremely Guarded  Family Communication  :  None presnt  Code Status :  Full  Consults  : Cards  PUD Prophylaxis :    Procedures  :     TTE -  1. Left ventricular ejection fraction, by estimation, is 40 to 45%. The left ventricle has mildly decreased function. The left ventricle demonstrates global hypokinesis. There is mild concentric left ventricular hypertrophy. Left ventricular diastolic parameters are consistent with Grade II diastolic dysfunction (pseudonormalization). Elevated left atrial pressure.  2. Right ventricular systolic function is normal. The right ventricular size is normal. Tricuspid regurgitation signal is inadequate for assessing PA pressure.  3. Left atrial size was mildly dilated.  4. The mitral valve is normal in structure. Mild to moderate mitral valve regurgitation.  5. The aortic valve is tricuspid. Aortic valve regurgitation is not visualized. Comparison(s): A prior study was performed on 10/12/2016. Prior images unable to be directly viewed, comparison made by report only. The left ventricular function is worsened. LBBB is also new since 2018.  CT - 1. Right lower lobe pneumonia. Clinical correlation and follow-up to resolution recommended. 2. Aortic Atherosclerosis       Disposition Plan  :    Status is: Inpatient  DVT Prophylaxis  :    Place and maintain sequential compression device Start: 02/14/22 2038    Lab Results   Component Value Date   PLT 463 (H) 02/18/2022    Diet :  Diet Order             Diet NPO time specified  Diet effective midnight           Diet Heart Room service appropriate? Yes; Fluid consistency: Thin  Diet effective now                    Inpatient Medications  Scheduled Meds:  aspirin  81 mg Oral Daily   enoxaparin (LOVENOX) injection  50 mg Subcutaneous Q24H   guaiFENesin  600 mg Oral BID   ipratropium  0.5 mg Nebulization BID   levalbuterol  0.63 mg Nebulization BID   losartan  25 mg Oral Daily   methimazole  5 mg Oral BID   metoprolol tartrate       metoprolol tartrate  50 mg Oral BID   nitroGLYCERIN       rosuvastatin  10 mg Oral Daily   sodium chloride flush  3 mL Intravenous Q12H   spironolactone  12.5 mg Oral Daily   Continuous Infusions:  sodium chloride     ceFEPime (MAXIPIME) IV 2 g (02/18/22 1444)   doxycycline (VIBRAMYCIN) IV 100 mg (02/18/22 0941)   PRN Meds:.sodium chloride, acetaminophen **OR** acetaminophen, albuterol, diltiazem, metoprolol tartrate, nitroGLYCERIN, sodium chloride flush     Phillips Climes M.D on 02/18/2022 at 4:56 PM  To page go to [email or web address]   [facility] -  Office  [phone]  See all Orders from today for further details    Objective:   Vitals:   02/17/22 2026 02/17/22 2243 02/18/22 0739 02/18/22 0836  BP:  137/74  (!) 145/63  Pulse: 95 92  89  Resp: '19 18  17  '$ Temp:  98 F (36.7 C)  98 F (36.7 C)  TempSrc:  Oral  Oral  SpO2: 100% 100% 100%   Weight:      Height:  Wt Readings from Last 3 Encounters:  02/13/22 90.7 kg  03/13/21 90.7 kg  03/10/21 90.7 kg    No intake or output data in the 24 hours ending 02/18/22 1656    Physical Exam  Awake Alert, Oriented X 3, No new F.N deficits, Normal affect Symmetrical Chest wall movement, Good air movement bilaterally, CTAB RRR,No Gallops,Rubs or new Murmurs, No Parasternal Heave +ve B.Sounds, Abd Soft, No tenderness, No rebound -  guarding or rigidity. No Cyanosis, Clubbing or edema, No new Rash or bruise         Data Review:    CBC Recent Labs  Lab 02/13/22 2146 02/14/22 1956 02/14/22 2213 02/16/22 0252 02/17/22 0303 02/18/22 0506  WBC 13.9* 10.8* 10.0 6.6 6.1 8.3  HGB 12.1 11.6* 10.9* 10.5* 9.8* 9.8*  HCT 37.0 36.4 33.7* 33.8* 30.7* 30.1*  PLT 391 358 345 361 387 463*  MCV 84.5 87.3 86.9 88.3 86.7 85.3  MCH 27.6 27.8 28.1 27.4 27.7 27.8  MCHC 32.7 31.9 32.3 31.1 31.9 32.6  RDW 14.8 14.9 15.2 15.3 15.4 15.5  LYMPHSABS 1.2 0.8  --  1.3 1.3 1.6  MONOABS 1.1* 1.0  --  0.9 0.6 0.6  EOSABS 0.0 0.1  --  0.1 0.2 0.2  BASOSABS 0.1 0.0  --  0.0 0.0 0.0    Electrolytes Recent Labs  Lab 02/13/22 2146 02/13/22 2316 02/13/22 2352 02/14/22 0017 02/14/22 1956 02/14/22 2213 02/15/22 1834 02/16/22 0252 02/17/22 0303 02/18/22 0506  NA 130*  --   --   --  135  --  136 137 139 138  K 3.9  --   --   --  3.8  --  4.0 3.6 4.4 4.1  CL 93*  --   --   --  101  --  101 102 106 105  CO2 24  --   --   --  24  --  '25 28 23 25  '$ GLUCOSE 118*  --   --   --  120*  --  110* 115* 111* 107*  BUN 22*  --   --   --  20  --  '18 19 15 13  '$ CREATININE 1.24*  --   --   --  0.81  --  0.65 0.71 0.52 0.52  CALCIUM 9.5  --   --   --  9.0  --  8.9 8.8* 8.9 9.2  AST 20  --   --   --  28  --  34 36 35 20  ALT 13  --   --   --  18  --  '21 23 27 23  '$ ALKPHOS 91  --   --   --  96  --  97 95 95 83  BILITOT 1.2  --   --   --  1.3*  --  0.9 0.5 0.8 0.7  ALBUMIN 4.1  --   --   --  3.2*  --  2.8* 2.7* 2.6* 2.7*  MG  --  2.0  --   --  2.3  --   --  2.2 2.0 2.0  CRP  --   --   --   --   --  32.7*  --  27.4* 19.9* 15.7*  PROCALCITON  --   --   --   --  1.03  --   --  0.50 0.27 0.12  LATICACIDVEN 1.3  --  1.2  --  1.7 0.9  --   --   --   --  TSH  --   --   --   --  0.127* 0.105*  --   --   --   --   BNP  --   --   --  87.5  --   --   --  71.5 146.8* 189.8*     ------------------------------------------------------------------------------------------------------------------ No results for input(s): "CHOL", "HDL", "LDLCALC", "TRIG", "CHOLHDL", "LDLDIRECT" in the last 72 hours.  No results found for: "HGBA1C"  No results for input(s): "TSH", "T4TOTAL", "T3FREE", "THYROIDAB" in the last 72 hours.  Invalid input(s): "FREET3"  ------------------------------------------------------------------------------------------------------------------ ID Labs Recent Labs  Lab 02/13/22 2146 02/13/22 2352 02/14/22 1956 02/14/22 2213 02/15/22 1834 02/16/22 0252 02/17/22 0303 02/18/22 0506  WBC 13.9*  --  10.8* 10.0  --  6.6 6.1 8.3  PLT 391  --  358 345  --  361 387 463*  CRP  --   --   --  32.7*  --  27.4* 19.9* 15.7*  PROCALCITON  --   --  1.03  --   --  0.50 0.27 0.12  LATICACIDVEN 1.3 1.2 1.7 0.9  --   --   --   --   CREATININE 1.24*  --  0.81  --  0.65 0.71 0.52 0.52    Radiology Reports CT CORONARY MORPH W/CTA COR W/SCORE W/CA W/CM &/OR WO/CM  Addendum Date: 02/18/2022   ADDENDUM REPORT: 02/18/2022 15:46 EXAM: OVER-READ INTERPRETATION  CT CHEST The following report is an over-read performed by radiologist Dr. Maudry Diego [facility] on 02/18/2022. This over-read does not include interpretation of cardiac or coronary anatomy or pathology. The coronary CTA interpretation by the cardiologist is attached. COMPARISON:  Chest CT dated February 14, 2022 FINDINGS: Vascular: Normal heart size. No pericardial effusion. No suspicious filling defects of the central pulmonary arteries. Normal caliber thoracic aorta with moderate calcified and noncalcified plaque. Moderate left main, lad and RCA coronary artery calcifications. Mediastinum/Nodes: Small hiatal hernia. No pathologically enlarged lymph nodes seen in the chest. Lungs/Pleura: Debris seen in the right mainstem and right lower lobe bronchi, new when compared to prior. Atelectasis of the  right middle lobe and lingula. Right lower lobe consolidation, similar to prior exam. No pleural effusion or pneumothorax. Upper Abdomen: No acute abnormality. Musculoskeletal: No chest wall mass or suspicious bone lesions identified. IMPRESSION: 1. Similar right lower lobe consolidation, likely due to infection or aspiration. 2. Debris seen in the right mainstem and right lower lobe bronchi, new when compared to prior, compatible with interval aspiration. Electronically Signed   By: Yetta Glassman M.D.   On: 02/18/2022 15:46   Result Date: 02/18/2022 HISTORY: Left bundle branch block (LBBB), new Heart failure, known or suspected, initial workup EXAM: Cardiac/Coronary CT TECHNIQUE: The patient was scanned on a [employer]. PROTOCOL: A 130 kV prospective scan was triggered in the descending thoracic aorta at 111 HU's. Axial non-contrast 3 mm slices were carried out through the heart. The data set was analyzed on a dedicated work station and scored using the Agatston method. Gantry rotation speed was 250 msecs and collimation was .6 mm. Heart rate was optimized medically and sl NTG was given. The 3D data set was reconstructed in 5% intervals of the 35-75 % of the R-R cycle. Systolic and diastolic phases were analyzed on a dedicated work station using MPR, MIP and VRT modes. The patient received 290m OMNIPAQUE IOHEXOL 350 MG/ML SOLN of contrast. FINDINGS: Coronary calcium score: The patient's coronary artery calcium score is 850, which places the  patient in the 99th percentile. Coronary arteries: Normal coronary origins.  Right dominance. Right Coronary Artery: Normal caliber vessel, gives rise to PDA. Proximal calcified plaque, but there is artifact in this area, cannot determine degree of stenosis. Left Main Coronary Artery: Normal caliber vessel. Proximal calcified plaque with 1-10% stenosis, distal mixed calcified and noncalcified plaque concerning for >50% stenosis but not well visualized. Left  Anterior Descending Coronary Artery: Normal caliber vessel. Diffuse proximal and mid mixed calcified and noncalcified plaque, not well visualized but concerning for at least >50% stenosis. Left Circumflex Artery: Normal caliber vessel. Not well visualized, minimal scattered calcified plaque. Aorta: Normal size, 32 mm at the mid ascending aorta (level of the PA bifurcation) measured double oblique. Aortic atherosclerosis. No dissection seen in visualized portions of the aorta. Aortic Valve: No calcifications. Trileaflet. Other findings: Normal pulmonary vein drainage into the left atrium. Normal left atrial appendage without a thrombus. Normal size of the pulmonary artery. Normal appearance of the pericardium. Elevated right hemidiaphragm. Mild mitral annular calcification. Overall quality: Fair to poor, moderate signal to noise artifact and some motion artifact. IMPRESSION: 1. Nondiagnostic study, CADRADS=N. Suspect significant CAD based on limited evaluation, including distal LM, proximal LAD, and proximal RCA. Suggest cardiac catheterization for definitive evaluation. 2. Coronary calcium score of 850. This was 99th percentile for age and sex matched control. 3. Normal coronary origin with right dominance. 4.  Aortic atherosclerosis 5.  Elevated right hemidiaphragm. Findings communicated with rounding cardiology team. INTERPRETATION: CAD-RADS N: Non-diagnostic study. Obstructive CAD can't be excluded. Alternative evaluation is recommended. Electronically Signed: By: Buford Dresser M.D. On: 02/18/2022 14:45   ECHOCARDIOGRAM COMPLETE  Result Date: 02/15/2022    ECHOCARDIOGRAM REPORT   Patient Name:   Rachel [name] Date of Exam: 02/15/2022 Medical Rec #:  [id number]          Height:       60.0 in Accession #:    [id number]         Weight:       200.0 lb Date of Birth:  Nov 04, 1960           BSA:          1.866 m Patient Age:    34 years           BP:           117/63 mmHg Patient Gender: F                   HR:           90 bpm. Exam Location:  Inpatient Procedure: 2D Echo, Cardiac Doppler and Color Doppler Indications:    elevated Troponin  History:        Patient has prior history of Echocardiogram examinations, most                 recent 10/12/2016. CHF, COPD; Risk Factors:Hypertension and                 Dyslipidemia.  Sonographer:    Wenda Low Referring Phys: [id number] ANASTASSIA DOUTOVA  Sonographer Comments: Patient is obese. Image acquisition challenging due to respiratory motion. IMPRESSIONS  1. Left ventricular ejection fraction, by estimation, is 40 to 45%. The left ventricle has mildly decreased function. The left ventricle demonstrates global hypokinesis. There is mild concentric left ventricular hypertrophy. Left ventricular diastolic parameters are consistent with Grade II diastolic dysfunction (pseudonormalization). Elevated left atrial pressure.  2. Right ventricular systolic function is normal. The right ventricular  size is normal. Tricuspid regurgitation signal is inadequate for assessing PA pressure.  3. Left atrial size was mildly dilated.  4. The mitral valve is normal in structure. Mild to moderate mitral valve regurgitation.  5. The aortic valve is tricuspid. Aortic valve regurgitation is not visualized. Comparison(s): A prior study was performed on 10/12/2016. Prior images unable to be directly viewed, comparison made by report only. The left ventricular function is worsened. LBBB is also new since 2018. FINDINGS  Left Ventricle: Left ventricular ejection fraction, by estimation, is 40 to 45%. The left ventricle has mildly decreased function. The left ventricle demonstrates global hypokinesis. The left ventricular internal cavity size was normal in size. There is  mild concentric left ventricular hypertrophy. Abnormal (paradoxical) septal motion, consistent with left bundle branch block. Left ventricular diastolic parameters are consistent with Grade II diastolic dysfunction  (pseudonormalization). Elevated left atrial pressure. Right Ventricle: The right ventricular size is normal. No increase in right ventricular wall thickness. Right ventricular systolic function is normal. Tricuspid regurgitation signal is inadequate for assessing PA pressure. Left Atrium: Left atrial size was mildly dilated. Right Atrium: Right atrial size was normal in size. Pericardium: There is no evidence of pericardial effusion. Mitral Valve: The mitral valve is normal in structure. Mild mitral annular calcification. Mild to moderate mitral valve regurgitation, with centrally-directed jet. MV peak gradient, 9.1 mmHg. The mean mitral valve gradient is 3.0 mmHg. Tricuspid Valve: The tricuspid valve is normal in structure. Tricuspid valve regurgitation is not demonstrated. Aortic Valve: The aortic valve is tricuspid. Aortic valve regurgitation is not visualized. Aortic valve mean gradient measures 5.0 mmHg. Aortic valve peak gradient measures 9.1 mmHg. Aortic valve area, by VTI measures 2.19 cm. Pulmonic Valve: The pulmonic valve was not well visualized. Pulmonic valve regurgitation is not visualized. Aorta: The aortic root is normal in size and structure. IAS/Shunts: No atrial level shunt detected by color flow Doppler.  LEFT VENTRICLE PLAX 2D LVIDd:         4.60 cm     Diastology LVIDs:         3.30 cm     LV e' medial:    8.81 cm/s LV PW:         1.30 cm     LV E/e' medial:  14.7 LV IVS:        1.30 cm     LV e' lateral:   7.00 cm/s LVOT diam:     2.00 cm     LV E/e' lateral: 18.5 LV SV:         61 LV SV Index:   32 LVOT Area:     3.14 cm  LV Volumes (MOD) LV vol d, MOD A2C: 63.5 ml LV vol d, MOD A4C: 68.0 ml LV vol s, MOD A2C: 37.2 ml LV vol s, MOD A4C: 30.0 ml LV SV MOD A2C:     26.3 ml LV SV MOD A4C:     68.0 ml LV SV MOD BP:      35.9 ml RIGHT VENTRICLE RV Basal diam:  3.00 cm RV Mid diam:    2.10 cm RV S prime:     12.60 cm/s TAPSE (M-mode): 2.0 cm LEFT ATRIUM             Index        RIGHT ATRIUM            Index LA diam:        4.10 cm 2.20 cm/m   RA Area:  11.50 cm LA Vol (A2C):   76.5 ml 40.99 ml/m  RA Volume:   25.90 ml  13.88 ml/m LA Vol (A4C):   58.0 ml 31.08 ml/m LA Biplane Vol: 70.9 ml 37.99 ml/m  AORTIC VALVE                     PULMONIC VALVE AV Area (Vmax):    2.29 cm      PV Vmax:       0.89 m/s AV Area (Vmean):   1.83 cm      PV Peak grad:  3.1 mmHg AV Area (VTI):     2.19 cm AV Vmax:           151.00 cm/s AV Vmean:          106.000 cm/s AV VTI:            0.277 m AV Peak Grad:      9.1 mmHg AV Mean Grad:      5.0 mmHg LVOT Vmax:         110.00 cm/s LVOT Vmean:        61.900 cm/s LVOT VTI:          0.193 m LVOT/AV VTI ratio: 0.70  AORTA Ao Root diam: 3.40 cm MITRAL VALVE MV Area (PHT): 3.65 cm     SHUNTS MV Area VTI:   1.88 cm     Systemic VTI:  0.19 m MV Peak grad:  9.1 mmHg     Systemic Diam: 2.00 cm MV Mean grad:  3.0 mmHg MV Vmax:       1.51 m/s MV Vmean:      85.4 cm/s MV Decel Time: 208 msec MV E velocity: 129.50 cm/s MV A velocity: 120.00 cm/s MV E/A ratio:  1.08 Mihai Croitoru MD Electronically signed by Sanda Klein MD Signature Date/Time: 02/15/2022/2:33:58 PM    Final    DG Chest Port 1 View  Result Date: 02/15/2022 CLINICAL DATA:  Shortness of breath. EXAM: PORTABLE CHEST 1 VIEW COMPARISON:  02/14/2022 at 7:37 p.m. FINDINGS: Lungs are hypoinflated demonstrate mild bibasilar opacification which may be due to atelectasis or infection. Possible small right effusion. Mild hazy prominence of the central pulmonary vessels suggesting a degree of vascular congestion. Mild stable cardiomegaly. Remainder of the exam is unchanged. IMPRESSION: 1. Hypoinflation with mild bibasilar opacification which may be due to atelectasis or infection. Possible small right effusion. 2. Mild stable cardiomegaly with suggestion of mild vascular congestion. Electronically Signed   By: Marin Olp M.D.   On: 02/15/2022 10:22   DG CHEST PORT 1 VIEW  Result Date: 02/14/2022 CLINICAL DATA:   Shortness of breath and cough. EXAM: PORTABLE CHEST 1 VIEW COMPARISON:  Chest radiograph dated 02/13/2022 and CT dated 02/15/2019. FINDINGS: Shallow inspiration. There is mild cardiomegaly with mild vascular congestion. Bibasilar atelectasis. Right lung base opacity seen on the earlier CT is suboptimally visualized on radiograph. No pleural effusion pneumothorax. Atherosclerotic calcification of the aortic arch. No acute osseous pathology. IMPRESSION: Mild cardiomegaly with mild vascular congestion. Electronically Signed   By: Anner Crete M.D.   On: 02/14/2022 19:54

## 2022-02-18 NOTE — Progress Notes (Signed)
    I discussed patient's coronary CT results with her including the recommendation for cardiac catheterization. I told her that we could either do the cath as an inpatient either late tomorrow afternoon, or Friday. I also offered discharging her home and having her come back for an outpatient catheterization within the next week. Patient decided that she would rather stay to get the study completed. She is aware that the catheterization could be either late tomorrow or Friday and she wants to stay.   I made patient NPO at midnight.   Margie Billet, PA-C 02/18/2022 5:04 PM

## 2022-02-18 NOTE — H&P (View-Only) (Signed)
    I discussed patient's coronary CT results with her including the recommendation for cardiac catheterization. I told her that we could either do the cath as an inpatient either late tomorrow afternoon, or Friday. I also offered discharging her home and having her come back for an outpatient catheterization within the next week. Patient decided that she would rather stay to get the study completed. She is aware that the catheterization could be either late tomorrow or Friday and she wants to stay.   I made patient NPO at midnight.   Margie Billet, PA-C 02/18/2022 5:04 PM

## 2022-02-19 ENCOUNTER — Other Ambulatory Visit (HOSPITAL_COMMUNITY): Payer: Self-pay

## 2022-02-19 ENCOUNTER — Encounter (HOSPITAL_COMMUNITY)
Admission: EM | Disposition: A | Discharge status: Home / Self Care | Payer: Self-pay | Source: Home / Self Care | Attending: Internal Medicine

## 2022-02-19 ENCOUNTER — Telehealth: Payer: Self-pay

## 2022-02-19 DIAGNOSIS — J189 Pneumonia, unspecified organism: Secondary | ICD-10-CM | POA: Diagnosis not present

## 2022-02-19 DIAGNOSIS — A419 Sepsis, unspecified organism: Secondary | ICD-10-CM | POA: Diagnosis not present

## 2022-02-19 DIAGNOSIS — R931 Abnormal findings on diagnostic imaging of heart and coronary circulation: Secondary | ICD-10-CM | POA: Diagnosis not present

## 2022-02-19 DIAGNOSIS — I2510 Atherosclerotic heart disease of native coronary artery without angina pectoris: Secondary | ICD-10-CM

## 2022-02-19 DIAGNOSIS — J9621 Acute and chronic respiratory failure with hypoxia: Secondary | ICD-10-CM | POA: Diagnosis not present

## 2022-02-19 HISTORY — PX: LEFT HEART CATH AND CORONARY ANGIOGRAPHY: CATH118249

## 2022-02-19 LAB — CULTURE, BLOOD (ROUTINE X 2)
Culture: NO GROWTH
Culture: NO GROWTH

## 2022-02-19 LAB — COMPREHENSIVE METABOLIC PANEL
ALT: 18 U/L (ref 0–44)
AST: 14 U/L — ABNORMAL LOW (ref 15–41)
Albumin: 2.7 g/dL — ABNORMAL LOW (ref 3.5–5.0)
Alkaline Phosphatase: 80 U/L (ref 38–126)
Anion gap: 11 (ref 5–15)
BUN: 11 mg/dL (ref 6–20)
CO2: 23 mmol/L (ref 22–32)
Calcium: 9.2 mg/dL (ref 8.9–10.3)
Chloride: 105 mmol/L (ref 98–111)
Creatinine, Ser: 0.52 mg/dL (ref 0.44–1.00)
GFR, Estimated: >60 mL/min (ref >60)
Glucose, Bld: 97 mg/dL (ref 70–99)
Potassium: 3.9 mmol/L (ref 3.5–5.1)
Sodium: 139 mmol/L (ref 135–145)
Total Bilirubin: 0.6 mg/dL (ref 0.3–1.2)
Total Protein: 6.7 g/dL (ref 6.5–8.1)

## 2022-02-19 LAB — CBC WITH DIFFERENTIAL/PLATELET
Abs Immature Granulocytes: 0.07 10*3/uL (ref 0.00–0.07)
Basophils Absolute: 0.1 10*3/uL (ref 0.0–0.1)
Basophils Relative: 1 %
Eosinophils Absolute: 0.2 10*3/uL (ref 0.0–0.5)
Eosinophils Relative: 2 %
HCT: 29.9 % — ABNORMAL LOW (ref 36.0–46.0)
Hemoglobin: 9.8 g/dL — ABNORMAL LOW (ref 12.0–15.0)
Immature Granulocytes: 1 %
Lymphocytes Relative: 19 %
Lymphs Abs: 1.6 10*3/uL (ref 0.7–4.0)
MCH: 27.8 pg (ref 26.0–34.0)
MCHC: 32.8 g/dL (ref 30.0–36.0)
MCV: 84.7 fL (ref 80.0–100.0)
Monocytes Absolute: 0.6 10*3/uL (ref 0.1–1.0)
Monocytes Relative: 7 %
Neutro Abs: 6 10*3/uL (ref 1.7–7.7)
Neutrophils Relative %: 70 %
Platelets: 523 10*3/uL — ABNORMAL HIGH (ref 150–400)
RBC: 3.53 MIL/uL — ABNORMAL LOW (ref 3.87–5.11)
RDW: 15.3 % (ref 11.5–15.5)
WBC: 8.5 10*3/uL (ref 4.0–10.5)
nRBC: 0 % (ref 0.0–0.2)

## 2022-02-19 LAB — BRAIN NATRIURETIC PEPTIDE: B Natriuretic Peptide: 323.6 pg/mL — ABNORMAL HIGH (ref 0.0–100.0)

## 2022-02-19 LAB — C-REACTIVE PROTEIN: CRP: 13.2 mg/dL — ABNORMAL HIGH (ref <1.0)

## 2022-02-19 LAB — MAGNESIUM: Magnesium: 1.9 mg/dL (ref 1.7–2.4)

## 2022-02-19 LAB — PROCALCITONIN: Procalcitonin: <0.1 ng/mL

## 2022-02-19 SURGERY — LEFT HEART CATH AND CORONARY ANGIOGRAPHY
Anesthesia: LOCAL

## 2022-02-19 MED ORDER — SODIUM CHLORIDE 0.9% FLUSH: 3.0000 mL | INTRAVENOUS | Status: DC | PRN

## 2022-02-19 MED ORDER — HEPARIN SODIUM (PORCINE) 1000 UNIT/ML IJ SOLN
INTRAMUSCULAR | Status: AC
  Filled 2022-02-19: qty 10

## 2022-02-19 MED ORDER — LABETALOL HCL 5 MG/ML IV SOLN
10.0000 mg | INTRAVENOUS | Status: AC | PRN
Start: 2022-02-19 — End: 2022-02-19
  Administered 2022-02-19: 10 mg via INTRAVENOUS

## 2022-02-19 MED ORDER — HEPARIN (PORCINE) IN NACL 1000-0.9 UT/500ML-% IV SOLN
INTRAVENOUS | Status: DC | PRN
  Administered 2022-02-19 (×2): 500 mL

## 2022-02-19 MED ORDER — DIAZEPAM 5 MG PO TABS: 5.0000 mg | ORAL_TABLET | Freq: Four times a day (QID) | ORAL | Status: DC | PRN

## 2022-02-19 MED ORDER — SODIUM CHLORIDE 0.9 % IV SOLN: INTRAVENOUS | Status: DC

## 2022-02-19 MED ORDER — METOPROLOL SUCCINATE ER 100 MG PO TB24
100.0000 mg | ORAL_TABLET | Freq: Every day | ORAL | Status: DC
Start: 2022-02-19 — End: 2022-02-20
  Administered 2022-02-19: 100 mg via ORAL
  Filled 2022-02-19: qty 1

## 2022-02-19 MED ORDER — FENTANYL CITRATE (PF) 100 MCG/2ML IJ SOLN
INTRAMUSCULAR | Status: DC | PRN
Start: 2022-02-19 — End: 2022-02-19
  Administered 2022-02-19: 25 ug via INTRAVENOUS
  Administered 2022-02-19: 50 ug via INTRAVENOUS

## 2022-02-19 MED ORDER — MIDAZOLAM HCL 2 MG/2ML IJ SOLN
INTRAMUSCULAR | Status: AC
  Filled 2022-02-19: qty 2

## 2022-02-19 MED ORDER — IOHEXOL 350 MG/ML SOLN
INTRAVENOUS | Status: DC | PRN
  Administered 2022-02-19: 60 mL

## 2022-02-19 MED ORDER — ASPIRIN 81 MG PO CHEW
81.0000 mg | CHEWABLE_TABLET | ORAL | Status: DC
  Filled 2022-02-19: qty 1

## 2022-02-19 MED ORDER — SODIUM CHLORIDE 0.9% FLUSH: 3.0000 mL | Freq: Two times a day (BID) | INTRAVENOUS | Status: DC

## 2022-02-19 MED ORDER — LIDOCAINE HCL (PF) 1 % IJ SOLN
INTRAMUSCULAR | Status: AC
  Filled 2022-02-19: qty 30

## 2022-02-19 MED ORDER — MIDAZOLAM HCL 2 MG/2ML IJ SOLN
INTRAMUSCULAR | Status: DC | PRN
  Administered 2022-02-19: 1 mg via INTRAVENOUS
  Administered 2022-02-19: 2 mg via INTRAVENOUS

## 2022-02-19 MED ORDER — SODIUM CHLORIDE 0.9 % IV SOLN: INTRAVENOUS | Status: AC

## 2022-02-19 MED ORDER — VERAPAMIL HCL 2.5 MG/ML IV SOLN
INTRAVENOUS | Status: AC
  Filled 2022-02-19: qty 2

## 2022-02-19 MED ORDER — SODIUM CHLORIDE 0.9 % IV SOLN: 250.0000 mL | INTRAVENOUS | Status: DC | PRN

## 2022-02-19 MED ORDER — ASPIRIN 81 MG PO CHEW: 81.0000 mg | CHEWABLE_TABLET | Freq: Every day | ORAL | Status: DC

## 2022-02-19 MED ORDER — METOPROLOL SUCCINATE ER 100 MG PO TB24
100.0000 mg | ORAL_TABLET | Freq: Every day | ORAL | 0 refills | Status: DC
  Filled 2022-02-19: qty 30, 30d supply, fill #0

## 2022-02-19 MED ORDER — HEPARIN (PORCINE) IN NACL 1000-0.9 UT/500ML-% IV SOLN
INTRAVENOUS | Status: AC
  Filled 2022-02-19: qty 1000

## 2022-02-19 MED ORDER — LIDOCAINE HCL (PF) 1 % IJ SOLN
INTRAMUSCULAR | Status: DC | PRN
  Administered 2022-02-19: 2 mL

## 2022-02-19 MED ORDER — HEPARIN SODIUM (PORCINE) 1000 UNIT/ML IJ SOLN
INTRAMUSCULAR | Status: DC | PRN
  Administered 2022-02-19: 4500 [IU] via INTRAVENOUS

## 2022-02-19 MED ORDER — ACETAMINOPHEN 325 MG PO TABS: 650.0000 mg | ORAL_TABLET | Freq: Four times a day (QID) | ORAL | Status: DC | PRN

## 2022-02-19 MED ORDER — LOSARTAN POTASSIUM 25 MG PO TABS
25.0000 mg | ORAL_TABLET | Freq: Every day | ORAL | 0 refills | Status: DC
  Filled 2022-02-19: qty 30, 30d supply, fill #0

## 2022-02-19 MED ORDER — VERAPAMIL HCL 2.5 MG/ML IV SOLN
INTRAVENOUS | Status: DC | PRN
  Administered 2022-02-19: 10 mL via INTRA_ARTERIAL

## 2022-02-19 MED ORDER — ROSUVASTATIN CALCIUM 10 MG PO TABS
10.0000 mg | ORAL_TABLET | Freq: Every day | ORAL | 0 refills | Status: DC
  Filled 2022-02-19: qty 30, 30d supply, fill #0

## 2022-02-19 MED ORDER — SPIRONOLACTONE 25 MG PO TABS
25.0000 mg | ORAL_TABLET | Freq: Every day | ORAL | Status: DC
Start: 2022-02-20 — End: 2022-02-19

## 2022-02-19 MED ORDER — SPIRONOLACTONE 25 MG PO TABS
25.0000 mg | ORAL_TABLET | Freq: Every day | ORAL | Status: DC
  Administered 2022-02-19: 25 mg via ORAL
  Filled 2022-02-19: qty 1

## 2022-02-19 MED ORDER — ASPIRIN 81 MG PO TBEC
81.0000 mg | DELAYED_RELEASE_TABLET | Freq: Every day | ORAL | 0 refills | Status: AC
  Filled 2022-02-19: qty 30, 30d supply, fill #0

## 2022-02-19 MED ORDER — ASPIRIN 81 MG PO CHEW
81.0000 mg | CHEWABLE_TABLET | Freq: Every day | ORAL | Status: DC
  Administered 2022-02-19: 81 mg via ORAL
  Filled 2022-02-19: qty 1

## 2022-02-19 MED ORDER — SODIUM CHLORIDE 0.9 % IV SOLN
INTRAVENOUS | Status: DC | PRN
  Administered 2022-02-19: 10 mL/h via INTRAVENOUS

## 2022-02-19 MED ORDER — LABETALOL HCL 5 MG/ML IV SOLN
INTRAVENOUS | Status: AC
  Filled 2022-02-19: qty 4

## 2022-02-19 MED ORDER — METHIMAZOLE 5 MG PO TABS
5.0000 mg | ORAL_TABLET | Freq: Two times a day (BID) | ORAL | 0 refills | Status: DC
  Filled 2022-02-19: qty 60, 30d supply, fill #0

## 2022-02-19 MED ORDER — SPIRONOLACTONE 25 MG PO TABS
25.0000 mg | ORAL_TABLET | Freq: Every day | ORAL | 0 refills | Status: DC
  Filled 2022-02-19: qty 30, 30d supply, fill #0

## 2022-02-19 MED ORDER — ACETAMINOPHEN 325 MG PO TABS: 650.0000 mg | ORAL_TABLET | ORAL | Status: DC | PRN

## 2022-02-19 MED ORDER — HYDRALAZINE HCL 20 MG/ML IJ SOLN
10.0000 mg | INTRAMUSCULAR | Status: AC | PRN
Start: 2022-02-19 — End: 2022-02-19

## 2022-02-19 MED ORDER — FENTANYL CITRATE (PF) 100 MCG/2ML IJ SOLN
INTRAMUSCULAR | Status: AC
  Filled 2022-02-19: qty 2

## 2022-02-19 SURGICAL SUPPLY — 11 items
BAND ZEPHYR COMPRESS 30 LONG (HEMOSTASIS) IMPLANT
CATH OPTITORQUE TIG 4.0 5F (CATHETERS) IMPLANT
GLIDESHEATH SLEND SS 6F .021 (SHEATH) IMPLANT
GUIDEWIRE INQWIRE 1.5J.035X260 (WIRE) IMPLANT
INQWIRE 1.5J .035X260CM (WIRE) ×1
KIT HEART LEFT (KITS) ×1 IMPLANT
PACK CARDIAC CATHETERIZATION (CUSTOM PROCEDURE TRAY) ×1 IMPLANT
SHEATH PROBE COVER 6X72 (BAG) IMPLANT
SYR MEDRAD MARK 7 150ML (SYRINGE) ×1 IMPLANT
TRANSDUCER W/STOPCOCK (MISCELLANEOUS) ×1 IMPLANT
TUBING CIL FLEX 10 FLL-RA (TUBING) ×1 IMPLANT

## 2022-02-19 NOTE — Telephone Encounter (Signed)
Pt is still admitted, will have to check again later.

## 2022-02-19 NOTE — Progress Notes (Signed)
Progress Note  Patient Name: Rachel [name] Date of Encounter: 02/19/2022  [facility] Cardiologist: Elouise Munroe, MD   Subjective   Tolerated cath well. No CP or SOB today. TR band in place R radial.  Inpatient Medications    Scheduled Meds:  aspirin  81 mg Oral Daily   enoxaparin (LOVENOX) injection  50 mg Subcutaneous Q24H   guaiFENesin  600 mg Oral BID   ipratropium  0.5 mg Nebulization BID   levalbuterol  0.63 mg Nebulization BID   losartan  25 mg Oral Daily   methimazole  5 mg Oral BID   metoprolol succinate  100 mg Oral Daily   rosuvastatin  10 mg Oral Daily   sodium chloride flush  3 mL Intravenous Q12H   sodium chloride flush  3 mL Intravenous Q12H   spironolactone  25 mg Oral Daily   Continuous Infusions:  sodium chloride     sodium chloride     sodium chloride     ceFEPime (MAXIPIME) IV 2 g (02/19/22 9407)   doxycycline (VIBRAMYCIN) IV 100 mg (02/18/22 2037)   PRN Meds: sodium chloride, sodium chloride, acetaminophen, albuterol, diazepam, diltiazem, hydrALAZINE, labetalol, sodium chloride flush, sodium chloride flush   Vital Signs    Vitals:   02/19/22 1130 02/19/22 1135 02/19/22 1145 02/19/22 1210  BP: (!) 194/82 (!) 197/67 (!) 154/73 (!) 154/69  Pulse: 94 72  82  Resp: (!) 29 (!) 25  (!) 22  Temp:    98.3 F (36.8 C)  TempSrc:    Oral  SpO2: 97% 93%  100%  Weight:      Height:       No intake or output data in the 24 hours ending 02/19/22 1331     02/13/2022    9:18 PM 03/13/2021    3:17 PM 03/10/2021    3:11 PM  Last 3 Weights  Weight (lbs) 199 lb 15.3 oz 200 lb 200 lb  Weight (kg) 90.7 kg 90.719 kg 90.719 kg      Telemetry    SR lbbb 1st deg avb - Personally Reviewed  ECG    No new - Personally Reviewed  Physical Exam   GEN: No acute distress.   Neck: No JVD Cardiac: RRR, no murmurs, rubs, or gallops.  Respiratory: Clear to auscultation bilaterally. GI: Soft, nontender, non-distended  MS: No edema; No  deformity. Neuro:  Nonfocal  Psych: Normal affect   Labs    High Sensitivity Troponin:   Recent Labs  Lab 02/13/22 2146 02/13/22 2352 02/14/22 1956  TROPONINIHS 28* 27* 18*     Chemistry Recent Labs  Lab 02/17/22 0303 02/18/22 0506 02/19/22 0349  NA 139 138 139  K 4.4 4.1 3.9  CL 106 105 105  CO2 '23 25 23  '$ GLUCOSE 111* 107* 97  BUN '15 13 11  '$ CREATININE 0.52 0.52 0.52  CALCIUM 8.9 9.2 9.2  MG 2.0 2.0 1.9  PROT 6.7 6.4* 6.7  ALBUMIN 2.6* 2.7* 2.7*  AST 35 20 14*  ALT '27 23 18  '$ ALKPHOS 95 83 80  BILITOT 0.8 0.7 0.6  GFRNONAA >60 >60 >60  ANIONGAP '10 8 11    '$ Lipids No results for input(s): "CHOL", "TRIG", "HDL", "LABVLDL", "LDLCALC", "CHOLHDL" in the last 168 hours.  Hematology Recent Labs  Lab 02/17/22 0303 02/18/22 0506 02/19/22 0349  WBC 6.1 8.3 8.5  RBC 3.54* 3.53* 3.53*  HGB 9.8* 9.8* 9.8*  HCT 30.7* 30.1* 29.9*  MCV 86.7 85.3 84.7  MCH 27.7 27.8 27.8  MCHC 31.9 32.6 32.8  RDW 15.4 15.5 15.3  PLT 387 463* 523*   Thyroid  Recent Labs  Lab 02/14/22 2213  TSH 0.105*  FREET4 1.18*    BNP Recent Labs  Lab 02/17/22 0303 02/18/22 0506 02/19/22 0349  BNP 146.8* 189.8* 323.6*    DDimer No results for input(s): "DDIMER" in the last 168 hours.   Radiology    CARDIAC CATHETERIZATION  Result Date: 02/19/2022   Prox RCA-1 lesion is 20% stenosed.   Prox RCA-2 lesion is 20% stenosed.   Mid RCA to Dist RCA lesion is 20% stenosed.   Acute Mrg lesion is 60% stenosed.   2nd Mrg lesion is 30% stenosed.   Prox LAD lesion is 5% stenosed. Mild to moderate multivessel coronary calcification with mild-moderate nonobstructive CAD. LV EDP 29 mmHg RECOMMENDATION: Guideline directed medical therapy for HFrEF.  Aggressive lipid-lowering therapy with target LDL less than 70.   CT CORONARY MORPH W/CTA COR W/SCORE W/CA W/CM &/OR WO/CM  Addendum Date: 02/18/2022   ADDENDUM REPORT: 02/18/2022 15:46 EXAM: OVER-READ INTERPRETATION  CT CHEST The following report is an  over-read performed by radiologist Dr. Maudry Diego [facility] on 02/18/2022. This over-read does not include interpretation of cardiac or coronary anatomy or pathology. The coronary CTA interpretation by the cardiologist is attached. COMPARISON:  Chest CT dated February 14, 2022 FINDINGS: Vascular: Normal heart size. No pericardial effusion. No suspicious filling defects of the central pulmonary arteries. Normal caliber thoracic aorta with moderate calcified and noncalcified plaque. Moderate left main, lad and RCA coronary artery calcifications. Mediastinum/Nodes: Small hiatal hernia. No pathologically enlarged lymph nodes seen in the chest. Lungs/Pleura: Debris seen in the right mainstem and right lower lobe bronchi, new when compared to prior. Atelectasis of the right middle lobe and lingula. Right lower lobe consolidation, similar to prior exam. No pleural effusion or pneumothorax. Upper Abdomen: No acute abnormality. Musculoskeletal: No chest wall mass or suspicious bone lesions identified. IMPRESSION: 1. Similar right lower lobe consolidation, likely due to infection or aspiration. 2. Debris seen in the right mainstem and right lower lobe bronchi, new when compared to prior, compatible with interval aspiration. Electronically Signed   By: Yetta Glassman M.D.   On: 02/18/2022 15:46   Result Date: 02/18/2022 HISTORY: Left bundle branch block (LBBB), new Heart failure, known or suspected, initial workup EXAM: Cardiac/Coronary CT TECHNIQUE: The patient was scanned on a [employer]. PROTOCOL: A 130 kV prospective scan was triggered in the descending thoracic aorta at 111 HU's. Axial non-contrast 3 mm slices were carried out through the heart. The data set was analyzed on a dedicated work station and scored using the Agatston method. Gantry rotation speed was 250 msecs and collimation was .6 mm. Heart rate was optimized medically and sl NTG was given. The 3D data set was reconstructed  in 5% intervals of the 35-75 % of the R-R cycle. Systolic and diastolic phases were analyzed on a dedicated work station using MPR, MIP and VRT modes. The patient received 241m OMNIPAQUE IOHEXOL 350 MG/ML SOLN of contrast. FINDINGS: Coronary calcium score: The patient's coronary artery calcium score is 850, which places the patient in the 99th percentile. Coronary arteries: Normal coronary origins.  Right dominance. Right Coronary Artery: Normal caliber vessel, gives rise to PDA. Proximal calcified plaque, but there is artifact in this area, cannot determine degree of stenosis. Left Main Coronary Artery: Normal caliber vessel. Proximal calcified plaque with 1-10% stenosis, distal mixed calcified and  noncalcified plaque concerning for >50% stenosis but not well visualized. Left Anterior Descending Coronary Artery: Normal caliber vessel. Diffuse proximal and mid mixed calcified and noncalcified plaque, not well visualized but concerning for at least >50% stenosis. Left Circumflex Artery: Normal caliber vessel. Not well visualized, minimal scattered calcified plaque. Aorta: Normal size, 32 mm at the mid ascending aorta (level of the PA bifurcation) measured double oblique. Aortic atherosclerosis. No dissection seen in visualized portions of the aorta. Aortic Valve: No calcifications. Trileaflet. Other findings: Normal pulmonary vein drainage into the left atrium. Normal left atrial appendage without a thrombus. Normal size of the pulmonary artery. Normal appearance of the pericardium. Elevated right hemidiaphragm. Mild mitral annular calcification. Overall quality: Fair to poor, moderate signal to noise artifact and some motion artifact. IMPRESSION: 1. Nondiagnostic study, CADRADS=N. Suspect significant CAD based on limited evaluation, including distal LM, proximal LAD, and proximal RCA. Suggest cardiac catheterization for definitive evaluation. 2. Coronary calcium score of 850. This was 99th percentile for age and  sex matched control. 3. Normal coronary origin with right dominance. 4.  Aortic atherosclerosis 5.  Elevated right hemidiaphragm. Findings communicated with rounding cardiology team. INTERPRETATION: CAD-RADS N: Non-diagnostic study. Obstructive CAD can't be excluded. Alternative evaluation is recommended. Electronically Signed: By: Buford Dresser M.D. On: 02/18/2022 14:45   ECHOCARDIOGRAM COMPLETE  Result Date: 02/15/2022    ECHOCARDIOGRAM REPORT   Patient Name:   MIHIKA SURRETTE Date of Exam: 02/15/2022 Medical Rec #:  [id number]          Height:       60.0 in Accession #:    [id number]         Weight:       200.0 lb Date of Birth:  [birth year]           BSA:          1.866 m Patient Age:    24 years           BP:           117/63 mmHg Patient Gender: F                  HR:           90 bpm. Exam Location:  Inpatient Procedure: 2D Echo, Cardiac Doppler and Color Doppler Indications:    elevated Troponin  History:        Patient has prior history of Echocardiogram examinations, most                 recent 10/12/2016. CHF, COPD; Risk Factors:Hypertension and                 Dyslipidemia.  Sonographer:    Wenda Low Referring Phys: [id number] ANASTASSIA DOUTOVA  Sonographer Comments: Patient is obese. Image acquisition challenging due to respiratory motion. IMPRESSIONS  1. Left ventricular ejection fraction, by estimation, is 40 to 45%. The left ventricle has mildly decreased function. The left ventricle demonstrates global hypokinesis. There is mild concentric left ventricular hypertrophy. Left ventricular diastolic parameters are consistent with Grade II diastolic dysfunction (pseudonormalization). Elevated left atrial pressure.  2. Right ventricular systolic function is normal. The right ventricular size is normal. Tricuspid regurgitation signal is inadequate for assessing PA pressure.  3. Left atrial size was mildly dilated.  4. The mitral valve is normal in structure. Mild to moderate mitral valve  regurgitation.  5. The aortic valve is tricuspid. Aortic valve regurgitation is not visualized. Comparison(s): A prior study was performed on  10/12/2016. Prior images unable to be directly viewed, comparison made by report only. The left ventricular function is worsened. LBBB is also new since 2018. FINDINGS  Left Ventricle: Left ventricular ejection fraction, by estimation, is 40 to 45%. The left ventricle has mildly decreased function. The left ventricle demonstrates global hypokinesis. The left ventricular internal cavity size was normal in size. There is  mild concentric left ventricular hypertrophy. Abnormal (paradoxical) septal motion, consistent with left bundle branch block. Left ventricular diastolic parameters are consistent with Grade II diastolic dysfunction (pseudonormalization). Elevated left atrial pressure. Right Ventricle: The right ventricular size is normal. No increase in right ventricular wall thickness. Right ventricular systolic function is normal. Tricuspid regurgitation signal is inadequate for assessing PA pressure. Left Atrium: Left atrial size was mildly dilated. Right Atrium: Right atrial size was normal in size. Pericardium: There is no evidence of pericardial effusion. Mitral Valve: The mitral valve is normal in structure. Mild mitral annular calcification. Mild to moderate mitral valve regurgitation, with centrally-directed jet. MV peak gradient, 9.1 mmHg. The mean mitral valve gradient is 3.0 mmHg. Tricuspid Valve: The tricuspid valve is normal in structure. Tricuspid valve regurgitation is not demonstrated. Aortic Valve: The aortic valve is tricuspid. Aortic valve regurgitation is not visualized. Aortic valve mean gradient measures 5.0 mmHg. Aortic valve peak gradient measures 9.1 mmHg. Aortic valve area, by VTI measures 2.19 cm. Pulmonic Valve: The pulmonic valve was not well visualized. Pulmonic valve regurgitation is not visualized. Aorta: The aortic root is normal in size and  structure. IAS/Shunts: No atrial level shunt detected by color flow Doppler.  LEFT VENTRICLE PLAX 2D LVIDd:         4.60 cm     Diastology LVIDs:         3.30 cm     LV e' medial:    8.81 cm/s LV PW:         1.30 cm     LV E/e' medial:  14.7 LV IVS:        1.30 cm     LV e' lateral:   7.00 cm/s LVOT diam:     2.00 cm     LV E/e' lateral: 18.5 LV SV:         61 LV SV Index:   32 LVOT Area:     3.14 cm  LV Volumes (MOD) LV vol d, MOD A2C: 63.5 ml LV vol d, MOD A4C: 68.0 ml LV vol s, MOD A2C: 37.2 ml LV vol s, MOD A4C: 30.0 ml LV SV MOD A2C:     26.3 ml LV SV MOD A4C:     68.0 ml LV SV MOD BP:      35.9 ml RIGHT VENTRICLE RV Basal diam:  3.00 cm RV Mid diam:    2.10 cm RV S prime:     12.60 cm/s TAPSE (M-mode): 2.0 cm LEFT ATRIUM             Index        RIGHT ATRIUM           Index LA diam:        4.10 cm 2.20 cm/m   RA Area:     11.50 cm LA Vol (A2C):   76.5 ml 40.99 ml/m  RA Volume:   25.90 ml  13.88 ml/m LA Vol (A4C):   58.0 ml 31.08 ml/m LA Biplane Vol: 70.9 ml 37.99 ml/m  AORTIC VALVE  PULMONIC VALVE AV Area (Vmax):    2.29 cm      PV Vmax:       0.89 m/s AV Area (Vmean):   1.83 cm      PV Peak grad:  3.1 mmHg AV Area (VTI):     2.19 cm AV Vmax:           151.00 cm/s AV Vmean:          106.000 cm/s AV VTI:            0.277 m AV Peak Grad:      9.1 mmHg AV Mean Grad:      5.0 mmHg LVOT Vmax:         110.00 cm/s LVOT Vmean:        61.900 cm/s LVOT VTI:          0.193 m LVOT/AV VTI ratio: 0.70  AORTA Ao Root diam: 3.40 cm MITRAL VALVE MV Area (PHT): 3.65 cm     SHUNTS MV Area VTI:   1.88 cm     Systemic VTI:  0.19 m MV Peak grad:  9.1 mmHg     Systemic Diam: 2.00 cm MV Mean grad:  3.0 mmHg MV Vmax:       1.51 m/s MV Vmean:      85.4 cm/s MV Decel Time: 208 msec MV E velocity: 129.50 cm/s MV A velocity: 120.00 cm/s MV E/A ratio:  1.08 Mihai Croitoru MD Electronically signed by Sanda Klein MD Signature Date/Time: 02/15/2022/2:33:58 PM    Final      Cardiac Studies   Echo as above,  CCTA nondiagnostic.  Patient Profile     61 y.o. female with a history of grade 2 diastolic dysfunction on Echo in 2018, hypertension, hyperlipidemia, COPD, history of sleep apnea, and GERD who is being seen for the evaluation of new cardiomyopathy and new LBBB with mildly reduced EF .  Assessment & Plan    Principal Problem:   Pneumonia Active Problems:   HYPERCHOLESTEROLEMIA   Essential hypertension, benign   Acute on chronic respiratory failure with hypoxemia (HCC)   Hyponatremia   Elevated troponin   Severe sepsis (HCC)   Prolonged QT interval   AKI (acute kidney injury) (HCC)   Chronic diastolic CHF (congestive heart failure) (HCC)   Hypothyroidism   Abnormal cardiac CT angiography  New LBBB and HFmrEF NICM - nondiagnostic CCTA due to image quality. Cath today showed mild mod disease.  - losartan 25 mg daily - metop succ 100 mg daily.  - spiro 25 mg daily.  - will consider SGLT2I likely at close follow up  Unclear etiology for HF but may be HTN or LBBB in origin. Will follow closely as outpt. See once in [place] then in [place] per patient preference.     For questions or updates, please contact [place] Please consult [email or web address] for contact info under        Signed, Elouise Munroe, MD  02/19/2022, 1:31 PM

## 2022-02-19 NOTE — Interval H&P Note (Signed)
Cath Lab Visit (complete for each Cath Lab visit)  Clinical Evaluation Leading to the Procedure:   ACS: No.  Non-ACS:    Anginal Classification: CCS III  Anti-ischemic medical therapy: Maximal Therapy (2 or more classes of medications)  Non-Invasive Test Results: Intermediate-risk stress test findings: cardiac mortality 1-3%/year  Prior CABG: No previous CABG      History and Physical Interval Note:  02/19/2022 9:28 AM  Rachel [name]  has presented today for surgery, with the diagnosis of abnormal coronary ct.  The various methods of treatment have been discussed with the patient and family. After consideration of risks, benefits and other options for treatment, the patient has consented to  Procedure(s): LEFT HEART CATH AND CORONARY ANGIOGRAPHY (N/A) as a surgical intervention.  The patient's history has been reviewed, patient examined, no change in status, stable for surgery.  I have reviewed the patient's chart and labs.  Questions were answered to the patient's satisfaction.     Shelva Majestic

## 2022-02-19 NOTE — Discharge Instructions (Addendum)
Call [facility] at [phone] if any bleeding, swelling or drainage at cath site.  May shower, no tub baths for 48 hours for groin sticks. No lifting over 5 pounds for 3 days.  No Driving for 3 days  Call cardiology if any issues with your cath site.    First appt in [place] then we will arrange in [place] or [place].

## 2022-02-19 NOTE — Progress Notes (Signed)
PT Cancellation Note  Patient Details Name: Rachel [name] MRN: [id number] DOB: Mar 22, 1961   Cancelled Treatment:    Reason Eval/Treat Not Completed: (P) Patient at procedure or test/unavailable (pt off floor for LHC procedure. Likely to transfer to new unit post-procedure.) Will continue efforts per PT plan of care as schedule permits once medically appropriate.  Kara Pacer Kess Mcilwain 02/19/2022, 10:54 AM

## 2022-02-19 NOTE — Discharge Summary (Signed)
Physician Discharge Summary  Rachel [name] ZWC:[id number] DOB: 1960-10-08 DOA: 02/13/2022  PCP: Redmond School, MD  Admit date: 02/13/2022 Discharge date: 02/19/2022  Admitted From: Home Disposition:  Home   Recommendations for Outpatient Follow-up:  Follow up with PCP in 1-2 weeks Please obtain BMP/CBC in one week Please follow on TSH, free T4, and adjust methimazole dose as needed, and consider referral to endocrinology Patient to follow with cardiology as an outpatient, [facility] will arrange for follow-up.  Discharge Condition: Stable CODE STATUS:FULL Diet recommendation: Heart Healthy  Brief/Interim Summary: 61 y.o. female with medical history significant of COPD,? HTN, HLD, diastolic CHF, OSA, obesity who presented to the hospital with cough, shortness of breath, excessive fatigue over the last 1 week.  Work-up in the ER consistent with pneumonia and she was admitted for further care.    Acute hypoxic respiratory failure, sepsis due to community-acquired pneumonia.   -Sepsis present on admission -She was treated with 7 days of IV antibiotics including cefepime and doxycycline, no further antibiotics at time of discharge, sepsis has resolved, respiratory failure has resolved as well, she is on room air   Hyperthyroidism.  TSH was noted to be on the lower side, and it does appear to be low as well over the last couple years, so she was placed on methimazole. -Continue to follow as an outpatient   HYPERCHOLESTEROLEMIA - Chronic stable continue Crestor 5 mg p.o. daily   Essential hypertension, benign - blood pressure improved placed on beta-blocker and losartan, please see discussion below   Hyponatremia - due to dehydration resolved with IV fluids.   Elevated troponin - due to demand mismatch from hypoxia, trend is flat and in non-ACS pattern, continue to monitor.  Placed on aspirin, check echocardiogram to evaluate wall motion.  Already on beta-blocker and statin for secondary  prevention.  Chest pain-free.  No events since she has a new left bundle branch block will get an echocardiogram.   Acute systolic heart failure new diagnosis.   Left bundle branch block -  previous echo few years ago had a EF of 60%.  EF has not dropped to 40% with global hypokinesis and new left bundle branch block.  He is currently compensated on beta-blocker statin, aspirin we will add low-dose ACE inhibitor and get cardiology input. -Cardiology input greatly appreciated, CT coronary today significant for distal left main, proximal RCA, and proximal LAD disease.  She went for cardiac cath today which was significant for mild to moderate coronary artery disease.  Patient is started on losartan, metoprolol XL, Aldactone          Condition - Extremely Guarded   Family Communication  :  None presnt   Code Status :  Full   Consults  : Cards   PUD Prophylaxis :     Procedures  :      TTE -  1. Left ventricular ejection fraction, by estimation, is 40 to 45%. The left ventricle has mildly decreased function. The left ventricle demonstrates global hypokinesis. There is mild concentric left ventricular hypertrophy. Left ventricular diastolic parameters are consistent with Grade II diastolic dysfunction (pseudonormalization). Elevated left atrial pressure.  2. Right ventricular systolic function is normal. The right ventricular size is normal. Tricuspid regurgitation signal is inadequate for assessing PA pressure.  3. Left atrial size was mildly dilated.  4. The mitral valve is normal in structure. Mild to moderate mitral valve regurgitation.  5. The aortic valve is tricuspid. Aortic valve regurgitation is not visualized. Comparison(s):  A prior study was performed on 10/12/2016. Prior images unable to be directly viewed, comparison made by report only. The left ventricular function is worsened. LBBB is also new since 2018.      Discharge Diagnoses:  Principal Problem:   Pneumonia Active  Problems:   HYPERCHOLESTEROLEMIA   Essential hypertension, benign   Acute on chronic respiratory failure with hypoxemia (HCC)   Hyponatremia   Elevated troponin   Severe sepsis (HCC)   Prolonged QT interval   AKI (acute kidney injury) (HCC)   Chronic diastolic CHF (congestive heart failure) (HCC)   Hypothyroidism   Abnormal cardiac CT angiography    Discharge Instructions  Discharge Instructions     Diet - low sodium heart healthy   Complete by: As directed    Discharge instructions   Complete by: As directed    Follow with Primary MD Redmond School, MD in 7 days   Get CBC, CMP,  checked  by Primary MD next visit.    Activity: As tolerated with Full fall precautions use walker/cane & assistance as needed   Disposition Home    Diet: Heart Healthy , with feeding assistance and aspiration precautions.  For Heart failure patients - Check your Weight same time everyday, if you gain over 2 pounds, or you develop in leg swelling, experience more shortness of breath or chest pain, call your Primary MD immediately. Follow Cardiac Low Salt Diet and 1.5 lit/day fluid restriction.   On your next visit with your primary care physician please Get Medicines reviewed and adjusted.   Please request your Prim.MD to go over all Hospital Tests and Procedure/Radiological results at the follow up, please get all Hospital records sent to your Prim MD by signing hospital release before you go home.   If you experience worsening of your admission symptoms, develop shortness of breath, life threatening emergency, suicidal or homicidal thoughts you must seek medical attention immediately by calling 911 or calling your MD immediately  if symptoms less severe.  You Must read complete instructions/literature along with all the possible adverse reactions/side effects for all the Medicines you take and that have been prescribed to you. Take any new Medicines after you have completely understood and  accpet all the possible adverse reactions/side effects.   Do not drive, operating heavy machinery, perform activities at heights, swimming or participation in water activities or provide baby sitting services if your were admitted for syncope or siezures until you have seen by Primary MD or a Neurologist and advised to do so again.  Do not drive when taking Pain medications.    Do not take more than prescribed Pain, Sleep and Anxiety Medications  Special Instructions: If you have smoked or chewed Tobacco  in the last 2 yrs please stop smoking, stop any regular Alcohol  and or any Recreational drug use.  Wear Seat belts while driving.   Please note  You were cared for by a hospitalist during your hospital stay. If you have any questions about your discharge medications or the care you received while you were in the hospital after you are discharged, you can call the unit and asked to speak with the hospitalist on call if the hospitalist that took care of you is not available. Once you are discharged, your primary care physician will handle any further medical issues. Please note that NO REFILLS for any discharge medications will be authorized once you are discharged, as it is imperative that you return to your primary care physician (or  establish a relationship with a primary care physician if you do not have one) for your aftercare needs so that they can reassess your need for medications and monitor your lab values.   Increase activity slowly   Complete by: As directed       Allergies as of 02/19/2022       Reactions   Hydrocodone Nausea And Vomiting   Oxycodone Nausea And Vomiting   Codeine, hydrocodone, most pain pills.   Penicillins Itching, Nausea And Vomiting, Swelling   Has patient had a PCN reaction causing immediate rash, facial/tongue/throat swelling, SOB or lightheadedness with hypotension: Yes, around the eyes Has patient had a PCN reaction causing severe rash involving mucus  membranes or skin necrosis: No Has patient had a PCN reaction that required hospitalization: No Has patient had a PCN reaction occurring within the last 10 years: No  If all of the above answers are "NO", then may proceed with Cephalosporin use.   Precedex [dexmedetomidine Hcl In Nacl] Other (See Comments)   Increased agitation/confusion in ICU [April 2018]   Seroquel [quetiapine] Other (See Comments)   More agitated/confused per daughter [name] when given in the past.   Shellfish Allergy Itching        Medication List     STOP taking these medications    ibuprofen 200 MG tablet Commonly known as: ADVIL   telmisartan-hydrochlorothiazide 80-25 MG tablet Commonly known as: MICARDIS HCT       TAKE these medications    acetaminophen 325 MG tablet Commonly known as: TYLENOL Take 2 tablets (650 mg total) by mouth every 6 (six) hours as needed for headache or mild pain.   albuterol (2.5 MG/3ML) 0.083% nebulizer solution Commonly known as: PROVENTIL Take 2.5 mg by nebulization every 4 (four) hours as needed for wheezing or shortness of breath.   ALLERGY EYE DROPS OP Place 1 drop into both eyes daily as needed (allergies).   aspirin EC 81 MG tablet Take 1 tablet (81 mg total) by mouth daily. Swallow whole.   clonazePAM 1 MG tablet Commonly known as: KLONOPIN Take 1 mg by mouth 3 (three) times daily as needed for anxiety.   diclofenac Sodium 1 % Gel Commonly known as: VOLTAREN Apply 2 g topically 3 (three) times daily as needed (pain).   fluticasone 50 MCG/ACT nasal spray Commonly known as: FLONASE Place 1 spray into both nostrils daily.   ipratropium 0.02 % nebulizer solution Commonly known as: ATROVENT Take 0.25 mg by nebulization every 6 (six) hours as needed for wheezing or shortness of breath.   losartan 25 MG tablet Commonly known as: COZAAR Take 1 tablet (25 mg total) by mouth daily.   Melatonin 10 MG Tabs Take 20 mg by mouth at bedtime as needed (sleep).    methimazole 5 MG tablet Commonly known as: TAPAZOLE Take 1 tablet (5 mg total) by mouth 2 (two) times daily.   metoprolol succinate 100 MG 24 hr tablet Commonly known as: TOPROL-XL Take 1 tablet (100 mg total) by mouth daily. Take with or immediately following a meal.   rosuvastatin 10 MG tablet Commonly known as: CRESTOR Take 1 tablet (10 mg total) by mouth daily.   spironolactone 25 MG tablet Commonly known as: ALDACTONE Take 1 tablet (25 mg total) by mouth daily.        Follow-up Information     Lendon Colonel, NP Follow up on 02/27/2022.   Specialties: Nurse Practitioner, Radiology, Cardiology Why: at 10:05 with Nurse practitioner.  please arrive  15 min ealry if possible to check in  this appt is in [place]. Contact information: [address] [place] [address] [phone]                Allergies  Allergen Reactions   Hydrocodone Nausea And Vomiting   Oxycodone Nausea And Vomiting    Codeine, hydrocodone, most pain pills.   Penicillins Itching, Nausea And Vomiting and Swelling    Has patient had a PCN reaction causing immediate rash, facial/tongue/throat swelling, SOB or lightheadedness with hypotension: Yes, around the eyes Has patient had a PCN reaction causing severe rash involving mucus membranes or skin necrosis: No Has patient had a PCN reaction that required hospitalization: No Has patient had a PCN reaction occurring within the last 10 years: No  If all of the above answers are "NO", then may proceed with Cephalosporin use.    Precedex [Dexmedetomidine Hcl In Nacl] Other (See Comments)    Increased agitation/confusion in ICU [April 2018]   Seroquel [Quetiapine] Other (See Comments)    More agitated/confused per daughter [name] when given in the past.   Shellfish Allergy Itching    Consultations: Cardiology   Procedures/Studies: CARDIAC CATHETERIZATION  Result Date: 02/19/2022   Prox RCA-1 lesion is 20% stenosed.    Prox RCA-2 lesion is 20% stenosed.   Mid RCA to Dist RCA lesion is 20% stenosed.   Acute Mrg lesion is 60% stenosed.   2nd Mrg lesion is 30% stenosed.   Prox LAD lesion is 5% stenosed. Mild to moderate multivessel coronary calcification with mild-moderate nonobstructive CAD. LV EDP 29 mmHg RECOMMENDATION: Guideline directed medical therapy for HFrEF.  Aggressive lipid-lowering therapy with target LDL less than 70.   CT CORONARY MORPH W/CTA COR W/SCORE W/CA W/CM &/OR WO/CM  Addendum Date: 02/18/2022   ADDENDUM REPORT: 02/18/2022 15:46 EXAM: OVER-READ INTERPRETATION  CT CHEST The following report is an over-read performed by radiologist Dr. Maudry Diego [facility] on 02/18/2022. This over-read does not include interpretation of cardiac or coronary anatomy or pathology. The coronary CTA interpretation by the cardiologist is attached. COMPARISON:  Chest CT dated February 14, 2022 FINDINGS: Vascular: Normal heart size. No pericardial effusion. No suspicious filling defects of the central pulmonary arteries. Normal caliber thoracic aorta with moderate calcified and noncalcified plaque. Moderate left main, lad and RCA coronary artery calcifications. Mediastinum/Nodes: Small hiatal hernia. No pathologically enlarged lymph nodes seen in the chest. Lungs/Pleura: Debris seen in the right mainstem and right lower lobe bronchi, new when compared to prior. Atelectasis of the right middle lobe and lingula. Right lower lobe consolidation, similar to prior exam. No pleural effusion or pneumothorax. Upper Abdomen: No acute abnormality. Musculoskeletal: No chest wall mass or suspicious bone lesions identified. IMPRESSION: 1. Similar right lower lobe consolidation, likely due to infection or aspiration. 2. Debris seen in the right mainstem and right lower lobe bronchi, new when compared to prior, compatible with interval aspiration. Electronically Signed   By: Yetta Glassman M.D.   On: 02/18/2022 15:46   Result  Date: 02/18/2022 HISTORY: Left bundle branch block (LBBB), new Heart failure, known or suspected, initial workup EXAM: Cardiac/Coronary CT TECHNIQUE: The patient was scanned on a [employer]. PROTOCOL: A 130 kV prospective scan was triggered in the descending thoracic aorta at 111 HU's. Axial non-contrast 3 mm slices were carried out through the heart. The data set was analyzed on a dedicated work station and scored using the Agatston method. Gantry rotation speed was 250 msecs and  collimation was .6 mm. Heart rate was optimized medically and sl NTG was given. The 3D data set was reconstructed in 5% intervals of the 35-75 % of the R-R cycle. Systolic and diastolic phases were analyzed on a dedicated work station using MPR, MIP and VRT modes. The patient received 235m OMNIPAQUE IOHEXOL 350 MG/ML SOLN of contrast. FINDINGS: Coronary calcium score: The patient's coronary artery calcium score is 850, which places the patient in the 99th percentile. Coronary arteries: Normal coronary origins.  Right dominance. Right Coronary Artery: Normal caliber vessel, gives rise to PDA. Proximal calcified plaque, but there is artifact in this area, cannot determine degree of stenosis. Left Main Coronary Artery: Normal caliber vessel. Proximal calcified plaque with 1-10% stenosis, distal mixed calcified and noncalcified plaque concerning for >50% stenosis but not well visualized. Left Anterior Descending Coronary Artery: Normal caliber vessel. Diffuse proximal and mid mixed calcified and noncalcified plaque, not well visualized but concerning for at least >50% stenosis. Left Circumflex Artery: Normal caliber vessel. Not well visualized, minimal scattered calcified plaque. Aorta: Normal size, 32 mm at the mid ascending aorta (level of the PA bifurcation) measured double oblique. Aortic atherosclerosis. No dissection seen in visualized portions of the aorta. Aortic Valve: No calcifications. Trileaflet. Other findings: Normal  pulmonary vein drainage into the left atrium. Normal left atrial appendage without a thrombus. Normal size of the pulmonary artery. Normal appearance of the pericardium. Elevated right hemidiaphragm. Mild mitral annular calcification. Overall quality: Fair to poor, moderate signal to noise artifact and some motion artifact. IMPRESSION: 1. Nondiagnostic study, CADRADS=N. Suspect significant CAD based on limited evaluation, including distal LM, proximal LAD, and proximal RCA. Suggest cardiac catheterization for definitive evaluation. 2. Coronary calcium score of 850. This was 99th percentile for age and sex matched control. 3. Normal coronary origin with right dominance. 4.  Aortic atherosclerosis 5.  Elevated right hemidiaphragm. Findings communicated with rounding cardiology team. INTERPRETATION: CAD-RADS N: Non-diagnostic study. Obstructive CAD can't be excluded. Alternative evaluation is recommended. Electronically Signed: By: BBuford DresserM.D. On: 02/18/2022 14:45   ECHOCARDIOGRAM COMPLETE  Result Date: 02/15/2022    ECHOCARDIOGRAM REPORT   Patient Name:   Rachel BEEDDate of Exam: 02/15/2022 Medical Rec #:  [id number]         Height:       60.0 in Accession #:    [id number]        Weight:       200.0 lb Date of Birth:  [birth year]          BSA:          1.866 m Patient Age:    89years           BP:           117/63 mmHg Patient Gender: F                  HR:           90 bpm. Exam Location:  Inpatient Procedure: 2D Echo, Cardiac Doppler and Color Doppler Indications:    elevated Troponin  History:        Patient has prior history of Echocardiogram examinations, most                 recent 10/12/2016. CHF, COPD; Risk Factors:Hypertension and                 Dyslipidemia.  Sonographer:    DWenda LowReferring Phys: [id number]  Sonographer Comments: Patient is obese. Image acquisition challenging due to respiratory motion. IMPRESSIONS  1. Left ventricular ejection fraction, by  estimation, is 40 to 45%. The left ventricle has mildly decreased function. The left ventricle demonstrates global hypokinesis. There is mild concentric left ventricular hypertrophy. Left ventricular diastolic parameters are consistent with Grade II diastolic dysfunction (pseudonormalization). Elevated left atrial pressure.  2. Right ventricular systolic function is normal. The right ventricular size is normal. Tricuspid regurgitation signal is inadequate for assessing PA pressure.  3. Left atrial size was mildly dilated.  4. The mitral valve is normal in structure. Mild to moderate mitral valve regurgitation.  5. The aortic valve is tricuspid. Aortic valve regurgitation is not visualized. Comparison(s): A prior study was performed on 10/12/2016. Prior images unable to be directly viewed, comparison made by report only. The left ventricular function is worsened. LBBB is also new since 2018. FINDINGS  Left Ventricle: Left ventricular ejection fraction, by estimation, is 40 to 45%. The left ventricle has mildly decreased function. The left ventricle demonstrates global hypokinesis. The left ventricular internal cavity size was normal in size. There is  mild concentric left ventricular hypertrophy. Abnormal (paradoxical) septal motion, consistent with left bundle branch block. Left ventricular diastolic parameters are consistent with Grade II diastolic dysfunction (pseudonormalization). Elevated left atrial pressure. Right Ventricle: The right ventricular size is normal. No increase in right ventricular wall thickness. Right ventricular systolic function is normal. Tricuspid regurgitation signal is inadequate for assessing PA pressure. Left Atrium: Left atrial size was mildly dilated. Right Atrium: Right atrial size was normal in size. Pericardium: There is no evidence of pericardial effusion. Mitral Valve: The mitral valve is normal in structure. Mild mitral annular calcification. Mild to moderate mitral valve  regurgitation, with centrally-directed jet. MV peak gradient, 9.1 mmHg. The mean mitral valve gradient is 3.0 mmHg. Tricuspid Valve: The tricuspid valve is normal in structure. Tricuspid valve regurgitation is not demonstrated. Aortic Valve: The aortic valve is tricuspid. Aortic valve regurgitation is not visualized. Aortic valve mean gradient measures 5.0 mmHg. Aortic valve peak gradient measures 9.1 mmHg. Aortic valve area, by VTI measures 2.19 cm. Pulmonic Valve: The pulmonic valve was not well visualized. Pulmonic valve regurgitation is not visualized. Aorta: The aortic root is normal in size and structure. IAS/Shunts: No atrial level shunt detected by color flow Doppler.  LEFT VENTRICLE PLAX 2D LVIDd:         4.60 cm     Diastology LVIDs:         3.30 cm     LV e' medial:    8.81 cm/s LV PW:         1.30 cm     LV E/e' medial:  14.7 LV IVS:        1.30 cm     LV e' lateral:   7.00 cm/s LVOT diam:     2.00 cm     LV E/e' lateral: 18.5 LV SV:         61 LV SV Index:   32 LVOT Area:     3.14 cm  LV Volumes (MOD) LV vol d, MOD A2C: 63.5 ml LV vol d, MOD A4C: 68.0 ml LV vol s, MOD A2C: 37.2 ml LV vol s, MOD A4C: 30.0 ml LV SV MOD A2C:     26.3 ml LV SV MOD A4C:     68.0 ml LV SV MOD BP:      35.9 ml RIGHT VENTRICLE RV Basal diam:  3.00 cm RV  Mid diam:    2.10 cm RV S prime:     12.60 cm/s TAPSE (M-mode): 2.0 cm LEFT ATRIUM             Index        RIGHT ATRIUM           Index LA diam:        4.10 cm 2.20 cm/m   RA Area:     11.50 cm LA Vol (A2C):   76.5 ml 40.99 ml/m  RA Volume:   25.90 ml  13.88 ml/m LA Vol (A4C):   58.0 ml 31.08 ml/m LA Biplane Vol: 70.9 ml 37.99 ml/m  AORTIC VALVE                     PULMONIC VALVE AV Area (Vmax):    2.29 cm      PV Vmax:       0.89 m/s AV Area (Vmean):   1.83 cm      PV Peak grad:  3.1 mmHg AV Area (VTI):     2.19 cm AV Vmax:           151.00 cm/s AV Vmean:          106.000 cm/s AV VTI:            0.277 m AV Peak Grad:      9.1 mmHg AV Mean Grad:      5.0 mmHg LVOT  Vmax:         110.00 cm/s LVOT Vmean:        61.900 cm/s LVOT VTI:          0.193 m LVOT/AV VTI ratio: 0.70  AORTA Ao Root diam: 3.40 cm MITRAL VALVE MV Area (PHT): 3.65 cm     SHUNTS MV Area VTI:   1.88 cm     Systemic VTI:  0.19 m MV Peak grad:  9.1 mmHg     Systemic Diam: 2.00 cm MV Mean grad:  3.0 mmHg MV Vmax:       1.51 m/s MV Vmean:      85.4 cm/s MV Decel Time: 208 msec MV E velocity: 129.50 cm/s MV A velocity: 120.00 cm/s MV E/A ratio:  1.08 Mihai Croitoru MD Electronically signed by Sanda Klein MD Signature Date/Time: 02/15/2022/2:33:58 PM    Final    DG Chest Port 1 View  Result Date: 02/15/2022 CLINICAL DATA:  Shortness of breath. EXAM: PORTABLE CHEST 1 VIEW COMPARISON:  02/14/2022 at 7:37 p.m. FINDINGS: Lungs are hypoinflated demonstrate mild bibasilar opacification which may be due to atelectasis or infection. Possible small right effusion. Mild hazy prominence of the central pulmonary vessels suggesting a degree of vascular congestion. Mild stable cardiomegaly. Remainder of the exam is unchanged. IMPRESSION: 1. Hypoinflation with mild bibasilar opacification which may be due to atelectasis or infection. Possible small right effusion. 2. Mild stable cardiomegaly with suggestion of mild vascular congestion. Electronically Signed   By: Marin Olp M.D.   On: 02/15/2022 10:22   DG CHEST PORT 1 VIEW  Result Date: 02/14/2022 CLINICAL DATA:  Shortness of breath and cough. EXAM: PORTABLE CHEST 1 VIEW COMPARISON:  Chest radiograph dated 02/13/2022 and CT dated 02/15/2019. FINDINGS: Shallow inspiration. There is mild cardiomegaly with mild vascular congestion. Bibasilar atelectasis. Right lung base opacity seen on the earlier CT is suboptimally visualized on radiograph. No pleural effusion pneumothorax. Atherosclerotic calcification of the aortic arch. No acute osseous pathology. IMPRESSION: Mild cardiomegaly with mild vascular congestion. Electronically  Signed   By: Anner Crete M.D.   On:  02/14/2022 19:54   CT Chest Wo Contrast  Result Date: 02/14/2022 CLINICAL DATA:  Pneumonia. EXAM: CT CHEST WITHOUT CONTRAST TECHNIQUE: Multidetector CT imaging of the chest was performed following the standard protocol without IV contrast. RADIATION DOSE REDUCTION: This exam was performed according to the departmental dose-optimization program which includes automated exposure control, adjustment of the mA and/or kV according to patient size and/or use of iterative reconstruction technique. COMPARISON:  Chest radiograph dated 02/13/2022. FINDINGS: Evaluation of this exam is limited in the absence of intravenous contrast as well as due to respiratory motion. Cardiovascular: Top-normal cardiac size. No pericardial effusion. Three-vessel coronary vascular calcification. Mild atherosclerotic calcification of the thoracic aorta. No aneurysmal dilatation. The central pulmonary arteries are grossly unremarkable. Mediastinum/Nodes: No obvious hilar or mediastinal adenopathy. The esophagus and thyroid gland are grossly unremarkable. No mediastinal fluid collection. Lungs/Pleura: There is an area of consolidation in the right lower lobe most consistent with pneumonia. Clinical correlation and follow-up to resolution recommended. Linear atelectasis or scarring in the lingula. There is no pleural effusion or pneumothorax. The central airways are patent. Upper Abdomen: No acute abnormality. Musculoskeletal: Degenerative changes of the spine. No acute osseous pathology. IMPRESSION: 1. Right lower lobe pneumonia. Clinical correlation and follow-up to resolution recommended. 2. Aortic Atherosclerosis (ICD10-I70.0). Electronically Signed   By: Anner Crete M.D.   On: 02/14/2022 00:29   DG Chest Port 1 View  Result Date: 02/13/2022 CLINICAL DATA:  Cough. EXAM: PORTABLE CHEST 1 VIEW COMPARISON:  December 20, 2021 FINDINGS: The heart size and mediastinal contours are within normal limits. There is moderate severity  calcification of the aortic arch. Low lung volumes are noted. Mild areas of linear scarring and/or atelectasis are seen within the bilateral lung bases. There is no evidence of an acute infiltrate, pleural effusion or pneumothorax. The visualized skeletal structures are unremarkable. IMPRESSION: Mild bibasilar linear scarring and/or atelectasis. Electronically Signed   By: Virgina Norfolk M.D.   On: 02/13/2022 23:17   (Echo, Carotid, EGD, Colonoscopy, ERCP)    Subjective:   Discharge Exam: Vitals:   02/19/22 1145 02/19/22 1210  BP: (!) 154/73 (!) 154/69  Pulse:  82  Resp:  (!) 22  Temp:  98.3 F (36.8 C)  SpO2:  100%   Vitals:   02/19/22 1130 02/19/22 1135 02/19/22 1145 02/19/22 1210  BP: (!) 194/82 (!) 197/67 (!) 154/73 (!) 154/69  Pulse: 94 72  82  Resp: (!) 29 (!) 25  (!) 22  Temp:    98.3 F (36.8 C)  TempSrc:    Oral  SpO2: 97% 93%  100%  Weight:      Height:        General: Pt is alert, awake, not in acute distress Cardiovascular: RRR, S1/S2 +, no rubs, no gallops Respiratory: CTA bilaterally, no wheezing, no rhonchi Abdominal: Soft, NT, ND, bowel sounds + Extremities: no edema, no cyanosis    The results of significant diagnostics from this hospitalization (including imaging, microbiology, ancillary and laboratory) are listed below for reference.     Microbiology: Recent Results (from the past 240 hour(s))  SARS Coronavirus 2 by RT PCR (hospital order, performed in [facility] hospital lab) *cepheid single result test* Anterior Nasal Swab     Status: None   Collection Time: 02/13/22  9:22 PM   Specimen: Anterior Nasal Swab  Result Value Ref Range Status   SARS Coronavirus 2 by RT PCR NEGATIVE NEGATIVE Final  Comment: (NOTE) SARS-CoV-2 target nucleic acids are NOT DETECTED.  The SARS-CoV-2 RNA is generally detectable in upper and lower respiratory specimens during the acute phase of infection. The lowest concentration of SARS-CoV-2 viral copies this  assay can detect is 250 copies / mL. A negative result does not preclude SARS-CoV-2 infection and should not be used as the sole basis for treatment or other patient management decisions.  A negative result may occur with improper specimen collection / handling, submission of specimen other than nasopharyngeal swab, presence of viral mutation(s) within the areas targeted by this assay, and inadequate number of viral copies (<250 copies / mL). A negative result must be combined with clinical observations, patient history, and epidemiological information.  Fact Sheet for Patients:   https://www.patel.info/  Fact Sheet for Healthcare Providers: https://hall.com/  This test is not yet approved or  cleared by the [place] FDA and has been authorized for detection and/or diagnosis of SARS-CoV-2 by FDA under an Emergency Use Authorization (EUA).  This EUA will remain in effect (meaning this test can be used) for the duration of the COVID-19 declaration under Section 564(b)(1) of the Act, 21 U.S.C. section 360bbb-3(b)(1), unless the authorization is terminated or revoked sooner.  Performed at [facility], [address] [place], [place] [address]   Blood culture (routine x 2)     Status: None   Collection Time: 02/13/22 10:26 PM   Specimen: BLOOD  Result Value Ref Range Status   Specimen Description   Final    BLOOD Performed at [facility], [address], [place], [place] [address]    Special Requests   Final    NONE Performed at [facility], [address] [place] [address] [place], [place] [address]    Culture   Final    NO GROWTH 5 DAYS Performed at [facility], [place] [address], [place], [place] [address]    Report Status 02/19/2022 FINAL  Final  Blood culture (routine x 2)     Status: None   Collection Time: 02/13/22 10:51 PM   Specimen: BLOOD  Result Value Ref Range Status    Specimen Description   Final    BLOOD Performed at [facility], [address] [place], [place] [address]    Special Requests   Final    NONE Performed at [facility], [address], [place], [place] [address]    Culture   Final    NO GROWTH 5 DAYS Performed at [facility], [place] [address], [place], [place] [address]    Report Status 02/19/2022 FINAL  Final  Resp Panel by RT-PCR (Flu A&B, Covid) Anterior Nasal Swab     Status: None   Collection Time: 02/13/22 11:16 PM   Specimen: Anterior Nasal Swab  Result Value Ref Range Status   SARS Coronavirus 2 by RT PCR NEGATIVE NEGATIVE Final    Comment: (NOTE) SARS-CoV-2 target nucleic acids are NOT DETECTED.  The SARS-CoV-2 RNA is generally detectable in upper respiratory specimens during the acute phase of infection. The lowest concentration of SARS-CoV-2 viral copies this assay can detect is 138 copies/mL. A negative result does not preclude SARS-Cov-2 infection and should not be used as the sole basis for treatment or other patient management decisions. A negative result may occur with  improper specimen collection/handling, submission of specimen other than nasopharyngeal swab, presence of viral mutation(s) within the areas targeted by this assay, and inadequate number of viral copies(<138 copies/mL). A negative result  must be combined with clinical observations, patient history, and epidemiological information. The expected result is Negative.  Fact Sheet for Patients:  EntrepreneurPulse.com.au  Fact Sheet for Healthcare Providers:  IncredibleEmployment.be  This test is no t yet approved or cleared by the [place] FDA and  has been authorized for detection and/or diagnosis of SARS-CoV-2 by FDA under an Emergency Use Authorization (EUA). This EUA will remain  in effect (meaning this test can be used) for the duration of the COVID-19  declaration under Section 564(b)(1) of the Act, 21 U.S.C.section 360bbb-3(b)(1), unless the authorization is terminated  or revoked sooner.       Influenza A by PCR NEGATIVE NEGATIVE Final   Influenza B by PCR NEGATIVE NEGATIVE Final    Comment: (NOTE) The Xpert Xpress SARS-CoV-2/FLU/RSV plus assay is intended as an aid in the diagnosis of influenza from Nasopharyngeal swab specimens and should not be used as a sole basis for treatment. Nasal washings and aspirates are unacceptable for Xpert Xpress SARS-CoV-2/FLU/RSV testing.  Fact Sheet for Patients: EntrepreneurPulse.com.au  Fact Sheet for Healthcare Providers: IncredibleEmployment.be  This test is not yet approved or cleared by the [place] FDA and has been authorized for detection and/or diagnosis of SARS-CoV-2 by FDA under an Emergency Use Authorization (EUA). This EUA will remain in effect (meaning this test can be used) for the duration of the COVID-19 declaration under Section 564(b)(1) of the Act, 21 U.S.C. section 360bbb-3(b)(1), unless the authorization is terminated or revoked.  Performed at [facility], [address], [place], [place] [address]   MRSA Next Gen by PCR, Nasal     Status: None   Collection Time: 02/13/22 11:16 PM   Specimen: Nasal Mucosa; Nasal Swab  Result Value Ref Range Status   MRSA by PCR Next Gen NOT DETECTED NOT DETECTED Final    Comment: (NOTE) The GeneXpert MRSA Assay (FDA approved for NASAL specimens only), is one component of a comprehensive MRSA colonization surveillance program. It is not intended to diagnose MRSA infection nor to guide or monitor treatment for MRSA infections. Test performance is not FDA approved in patients less than 12 years old. Performed at [facility], [place] [address], [place], [place] [address]   Urine Culture     Status: Abnormal   Collection Time: 02/14/22  3:30 AM   Specimen: Urine,  Clean Catch  Result Value Ref Range Status   Specimen Description   Final    URINE, CLEAN CATCH Performed at [facility], [address], [place], [place] [address]    Special Requests   Final    NONE Performed at [facility], [address], [place], [place] [address]    Culture (A)  Final    <10,000 COLONIES/mL INSIGNIFICANT GROWTH Performed at [facility] [address] [place], [place] [address]    Report Status 02/15/2022 FINAL  Final  Respiratory (~20 pathogens) panel by PCR     Status: None   Collection Time: 02/14/22  7:47 PM   Specimen: Nasopharyngeal Swab; Respiratory  Result Value Ref Range Status   Adenovirus NOT DETECTED NOT DETECTED Final   Coronavirus 229E NOT DETECTED NOT DETECTED Final    Comment: (NOTE) The Coronavirus on the Respiratory Panel, DOES NOT test for the novel  Coronavirus (2019 nCoV)    Coronavirus HKU1 NOT DETECTED NOT DETECTED Final   Coronavirus NL63 NOT DETECTED NOT DETECTED Final   Coronavirus OC43 NOT DETECTED NOT DETECTED Final   Metapneumovirus NOT  DETECTED NOT DETECTED Final   Rhinovirus / Enterovirus NOT DETECTED NOT DETECTED Final   Influenza A NOT DETECTED NOT DETECTED Final   Influenza B NOT DETECTED NOT DETECTED Final   Parainfluenza Virus 1 NOT DETECTED NOT DETECTED Final   Parainfluenza Virus 2 NOT DETECTED NOT DETECTED Final   Parainfluenza Virus 3 NOT DETECTED NOT DETECTED Final   Parainfluenza Virus 4 NOT DETECTED NOT DETECTED Final   Respiratory Syncytial Virus NOT DETECTED NOT DETECTED Final   Bordetella pertussis NOT DETECTED NOT DETECTED Final   Bordetella Parapertussis NOT DETECTED NOT DETECTED Final   Chlamydophila pneumoniae NOT DETECTED NOT DETECTED Final   Mycoplasma pneumoniae NOT DETECTED NOT DETECTED Final    Comment: Performed at [facility], [place] [address] [place], [place] [address]  Expectorated Sputum Assessment w Gram Stain, Rflx to Resp Cult      Status: None   Collection Time: 02/14/22  9:20 PM   Specimen: Expectorated Sputum  Result Value Ref Range Status   Specimen Description EXPECTORATED SPUTUM  Final   Special Requests NONE  Final   Sputum evaluation   Final    THIS SPECIMEN IS ACCEPTABLE FOR SPUTUM CULTURE Performed at [facility], [place] [address], [place], [place] [address]    Report Status 02/15/2022 FINAL  Final  Culture, Respiratory w Gram Stain     Status: None   Collection Time: 02/14/22  9:20 PM  Result Value Ref Range Status   Specimen Description EXPECTORATED SPUTUM  Final   Special Requests NONE Reflexed from [id number]  Final   Gram Stain   Final    RARE SQUAMOUS EPITHELIAL CELLS PRESENT RARE WBC PRESENT, PREDOMINANTLY MONONUCLEAR RARE GRAM POSITIVE COCCI IN PAIRS RARE GRAM VARIABLE ROD    Culture   Final    RARE Normal respiratory flora-no Staph aureus or Pseudomonas seen Performed at [facility], [place] [address], [place], [place] [address]    Report Status 02/17/2022 FINAL  Final     Labs: BNP (last 3 results) Recent Labs    02/17/22 0303 02/18/22 0506 02/19/22 0349  BNP 146.8* 189.8* 412.8*   Basic Metabolic Panel: Recent Labs  Lab 02/14/22 1956 02/15/22 1834 02/16/22 0252 02/17/22 0303 02/18/22 0506 02/19/22 0349  NA 135 136 137 139 138 139  K 3.8 4.0 3.6 4.4 4.1 3.9  CL 101 101 102 106 105 105  CO2 '24 25 28 23 25 23  '$ GLUCOSE 120* 110* 115* 111* 107* 97  BUN '20 18 19 15 13 11  '$ CREATININE 0.81 0.65 0.71 0.52 0.52 0.52  CALCIUM 9.0 8.9 8.8* 8.9 9.2 9.2  MG 2.3  --  2.2 2.0 2.0 1.9  PHOS 2.6  --   --   --   --   --    Liver Function Tests: Recent Labs  Lab 02/15/22 1834 02/16/22 0252 02/17/22 0303 02/18/22 0506 02/19/22 0349  AST 34 36 35 20 14*  ALT '21 23 27 23 18  '$ ALKPHOS 97 95 95 83 80  BILITOT 0.9 0.5 0.8 0.7 0.6  PROT 7.1 7.1 6.7 6.4* 6.7  ALBUMIN 2.8* 2.7* 2.6* 2.7* 2.7*   No results for input(s): "LIPASE", "AMYLASE" in the last 168 hours. No results  for input(s): "AMMONIA" in the last 168 hours. CBC: Recent Labs  Lab 02/14/22 1956 02/14/22 2213 02/16/22 0252 02/17/22 0303 02/18/22 0506 02/19/22 0349  WBC 10.8* 10.0 6.6 6.1 8.3 8.5  NEUTROABS 9.0*  --  4.2 4.0 5.8 6.0  HGB 11.6* 10.9* 10.5* 9.8* 9.8* 9.8*  HCT 36.4 33.7* 33.8* 30.7* 30.1* 29.9*  MCV 87.3 86.9 88.3 86.7 85.3 84.7  PLT 358 345 361 387 463* 523*   Cardiac Enzymes: Recent Labs  Lab 02/14/22 1956  CKTOTAL 124   BNP: Invalid input(s): "POCBNP" CBG: No results for input(s): "GLUCAP" in the last 168 hours. D-Dimer No results for input(s): "DDIMER" in the last 72 hours. Hgb A1c No results for input(s): "HGBA1C" in the last 72 hours. Lipid Profile No results for input(s): "CHOL", "HDL", "LDLCALC", "TRIG", "CHOLHDL", "LDLDIRECT" in the last 72 hours. Thyroid function studies No results for input(s): "TSH", "T4TOTAL", "T3FREE", "THYROIDAB" in the last 72 hours.  Invalid input(s): "FREET3" Anemia work up No results for input(s): "VITAMINB12", "FOLATE", "FERRITIN", "TIBC", "IRON", "RETICCTPCT" in the last 72 hours. Urinalysis    Component Value Date/Time   COLORURINE YELLOW 02/14/2022 0330   APPEARANCEUR HAZY (A) 02/14/2022 0330   LABSPEC 1.011 02/14/2022 0330   PHURINE 5.5 02/14/2022 0330   GLUCOSEU NEGATIVE 02/14/2022 0330   HGBUR NEGATIVE 02/14/2022 0330   BILIRUBINUR NEGATIVE 02/14/2022 0330   KETONESUR NEGATIVE 02/14/2022 0330   PROTEINUR 30 (A) 02/14/2022 0330   NITRITE NEGATIVE 02/14/2022 0330   LEUKOCYTESUR NEGATIVE 02/14/2022 0330   Sepsis Labs Recent Labs  Lab 02/16/22 0252 02/17/22 0303 02/18/22 0506 02/19/22 0349  WBC 6.6 6.1 8.3 8.5   Microbiology Recent Results (from the past 240 hour(s))  SARS Coronavirus 2 by RT PCR (hospital order, performed in [facility] hospital lab) *cepheid single result test* Anterior Nasal Swab     Status: None   Collection Time: 02/13/22  9:22 PM   Specimen: Anterior Nasal Swab  Result Value Ref Range  Status   SARS Coronavirus 2 by RT PCR NEGATIVE NEGATIVE Final    Comment: (NOTE) SARS-CoV-2 target nucleic acids are NOT DETECTED.  The SARS-CoV-2 RNA is generally detectable in upper and lower respiratory specimens during the acute phase of infection. The lowest concentration of SARS-CoV-2 viral copies this assay can detect is 250 copies / mL. A negative result does not preclude SARS-CoV-2 infection and should not be used as the sole basis for treatment or other patient management decisions.  A negative result may occur with improper specimen collection / handling, submission of specimen other than nasopharyngeal swab, presence of viral mutation(s) within the areas targeted by this assay, and inadequate number of viral copies (<250 copies / mL). A negative result must be combined with clinical observations, patient history, and epidemiological information.  Fact Sheet for Patients:   https://www.patel.info/  Fact Sheet for Healthcare Providers: https://hall.com/  This test is not yet approved or  cleared by the [place] FDA and has been authorized for detection and/or diagnosis of SARS-CoV-2 by FDA under an Emergency Use Authorization (EUA).  This EUA will remain in effect (meaning this test can be used) for the duration of the COVID-19 declaration under Section 564(b)(1) of the Act, 21 U.S.C. section 360bbb-3(b)(1), unless the authorization is terminated or revoked sooner.  Performed at [facility], [address] [place], [place] [address]   Blood culture (routine x 2)     Status: None   Collection Time: 02/13/22 10:26 PM   Specimen: BLOOD  Result Value Ref Range Status   Specimen Description   Final    BLOOD Performed at [facility], [address] [place], [place] [address]    Special Requests   Final    NONE Performed at [facility], [address]  [place], [place] [address]    Culture   Final    NO GROWTH 5 DAYS Performed at [facility], [place] [address], [place], [place] [address]    Report Status 02/19/2022 FINAL  Final  Blood culture (routine x 2)     Status: None   Collection Time: 02/13/22 10:51 PM   Specimen: BLOOD  Result Value Ref Range Status   Specimen Description   Final    BLOOD Performed at [facility], [address], [place], [place] [address]    Special Requests   Final    NONE Performed at [facility], [address], [place], [place] [address]    Culture   Final    NO GROWTH 5 DAYS Performed at [facility], [place] [address] [place], [place] [address]    Report Status 02/19/2022 FINAL  Final  Resp Panel by RT-PCR (Flu A&B, Covid) Anterior Nasal Swab     Status: None   Collection Time: 02/13/22 11:16 PM   Specimen: Anterior Nasal Swab  Result Value Ref Range Status   SARS Coronavirus 2 by RT PCR NEGATIVE NEGATIVE Final    Comment: (NOTE) SARS-CoV-2 target nucleic acids are NOT DETECTED.  The SARS-CoV-2 RNA is generally detectable in upper respiratory specimens during the acute phase of infection. The lowest concentration of SARS-CoV-2 viral copies this assay can detect is 138 copies/mL. A negative result does not preclude SARS-Cov-2 infection and should not be used as the sole basis for treatment or other patient management decisions. A negative result may occur with  improper specimen collection/handling, submission of specimen other than nasopharyngeal swab, presence of viral mutation(s) within the areas targeted by this assay, and inadequate number of viral copies(<138 copies/mL). A negative result must be combined with clinical observations, patient history, and epidemiological information. The expected result is Negative.  Fact Sheet for Patients:  EntrepreneurPulse.com.au  Fact Sheet for Healthcare Providers:   IncredibleEmployment.be  This test is no t yet approved or cleared by the [place] FDA and  has been authorized for detection and/or diagnosis of SARS-CoV-2 by FDA under an Emergency Use Authorization (EUA). This EUA will remain  in effect (meaning this test can be used) for the duration of the COVID-19 declaration under Section 564(b)(1) of the Act, 21 U.S.C.section 360bbb-3(b)(1), unless the authorization is terminated  or revoked sooner.       Influenza A by PCR NEGATIVE NEGATIVE Final   Influenza B by PCR NEGATIVE NEGATIVE Final    Comment: (NOTE) The Xpert Xpress SARS-CoV-2/FLU/RSV plus assay is intended as an aid in the diagnosis of influenza from Nasopharyngeal swab specimens and should not be used as a sole basis for treatment. Nasal washings and aspirates are unacceptable for Xpert Xpress SARS-CoV-2/FLU/RSV testing.  Fact Sheet for Patients: EntrepreneurPulse.com.au  Fact Sheet for Healthcare Providers: IncredibleEmployment.be  This test is not yet approved or cleared by the [place] FDA and has been authorized for detection and/or diagnosis of SARS-CoV-2 by FDA under an Emergency Use Authorization (EUA). This EUA will remain in effect (meaning this test can be used) for the duration of the COVID-19 declaration under Section 564(b)(1) of the Act, 21 U.S.C. section 360bbb-3(b)(1), unless the authorization is terminated or revoked.  Performed at [facility], [address], [place],  [address]   MRSA Next Gen by PCR, Nasal     Status: None   Collection Time: 02/13/22 11:16 PM   Specimen: Nasal  Mucosa; Nasal Swab  Result Value Ref Range Status   MRSA by PCR Next Gen NOT DETECTED NOT DETECTED Final    Comment: (NOTE) The GeneXpert MRSA Assay (FDA approved for NASAL specimens only), is one component of a comprehensive MRSA colonization surveillance program. It is not intended  to diagnose MRSA infection nor to guide or monitor treatment for MRSA infections. Test performance is not FDA approved in patients less than 76 years old. Performed at [facility], [place] [address] [place], [place] [address]   Urine Culture     Status: Abnormal   Collection Time: 02/14/22  3:30 AM   Specimen: Urine, Clean Catch  Result Value Ref Range Status   Specimen Description   Final    URINE, CLEAN CATCH Performed at [facility], [address] [place], [place] [address]    Special Requests   Final    NONE Performed at [facility], [address] [place], [place] [address]    Culture (A)  Final    <10,000 COLONIES/mL INSIGNIFICANT GROWTH Performed at [facility] [address], [place], [place] [address]    Report Status 02/15/2022 FINAL  Final  Respiratory (~20 pathogens) panel by PCR     Status: None   Collection Time: 02/14/22  7:47 PM   Specimen: Nasopharyngeal Swab; Respiratory  Result Value Ref Range Status   Adenovirus NOT DETECTED NOT DETECTED Final   Coronavirus 229E NOT DETECTED NOT DETECTED Final    Comment: (NOTE) The Coronavirus on the Respiratory Panel, DOES NOT test for the novel  Coronavirus (2019 nCoV)    Coronavirus HKU1 NOT DETECTED NOT DETECTED Final   Coronavirus NL63 NOT DETECTED NOT DETECTED Final   Coronavirus OC43 NOT DETECTED NOT DETECTED Final   Metapneumovirus NOT DETECTED NOT DETECTED Final   Rhinovirus / Enterovirus NOT DETECTED NOT DETECTED Final   Influenza A NOT DETECTED NOT DETECTED Final   Influenza B NOT DETECTED NOT DETECTED Final   Parainfluenza Virus 1 NOT DETECTED NOT DETECTED Final   Parainfluenza Virus 2 NOT DETECTED NOT DETECTED Final   Parainfluenza Virus 3 NOT DETECTED NOT DETECTED Final   Parainfluenza Virus 4 NOT DETECTED NOT DETECTED Final   Respiratory Syncytial Virus NOT DETECTED NOT DETECTED Final   Bordetella pertussis NOT DETECTED NOT DETECTED Final    Bordetella Parapertussis NOT DETECTED NOT DETECTED Final   Chlamydophila pneumoniae NOT DETECTED NOT DETECTED Final   Mycoplasma pneumoniae NOT DETECTED NOT DETECTED Final    Comment: Performed at [facility], [place], [place], [place] [address]  Expectorated Sputum Assessment w Gram Stain, Rflx to Resp Cult     Status: None   Collection Time: 02/14/22  9:20 PM   Specimen: Expectorated Sputum  Result Value Ref Range Status   Specimen Description EXPECTORATED SPUTUM  Final   Special Requests NONE  Final   Sputum evaluation   Final    THIS SPECIMEN IS ACCEPTABLE FOR SPUTUM CULTURE Performed at [facility], [place] [address] [place], [place] [address]    Report Status 02/15/2022 FINAL  Final  Culture, Respiratory w Gram Stain     Status: None   Collection Time: 02/14/22  9:20 PM  Result Value Ref Range Status   Specimen Description EXPECTORATED SPUTUM  Final   Special Requests NONE Reflexed from [id number]  Final   Gram Stain   Final    RARE SQUAMOUS EPITHELIAL CELLS PRESENT RARE WBC PRESENT, PREDOMINANTLY MONONUCLEAR RARE Lonell Grandchild  POSITIVE COCCI IN PAIRS RARE GRAM VARIABLE ROD    Culture   Final    RARE Normal respiratory flora-no Staph aureus or Pseudomonas seen Performed at [facility], [address], [place], [place] [address]    Report Status 02/17/2022 FINAL  Final     Time coordinating discharge: Over 30 minutes  SIGNED:   Phillips Climes, MD  [facility] 02/19/2022, 2:03 PM Pager   If 7PM-7AM, please contact night-coverage [email or web address] Password [email or web address]

## 2022-02-19 NOTE — Telephone Encounter (Signed)
-----   Message from Isaiah Serge, NP sent at 02/19/2022  1:33 PM EDT ----- D/c 02/19/22 with new meds.  Appt for follow up

## 2022-02-19 NOTE — Interval H&P Note (Signed)
History and Physical Interval Note:  02/19/2022 9:27 AM  Rachel [name]  has presented today for surgery, with the diagnosis of abnormal coronary ct.  The various methods of treatment have been discussed with the patient and family. After consideration of risks, benefits and other options for treatment, the patient has consented to  Procedure(s): LEFT HEART CATH AND CORONARY ANGIOGRAPHY (N/A) as a surgical intervention.  The patient's history has been reviewed, patient examined, no change in status, stable for surgery.  I have reviewed the patient's chart and labs.  Questions were answered to the patient's satisfaction.     Shelva Majestic

## 2022-02-20 ENCOUNTER — Encounter (HOSPITAL_COMMUNITY): Payer: Self-pay | Admitting: Cardiovascular Disease

## 2022-02-24 DIAGNOSIS — E118 Type 2 diabetes mellitus with unspecified complications: Secondary | ICD-10-CM | POA: Diagnosis not present

## 2022-02-24 DIAGNOSIS — Z6841 Body Mass Index (BMI) 40.0 and over, adult: Secondary | ICD-10-CM | POA: Diagnosis not present

## 2022-02-24 DIAGNOSIS — R69 Illness, unspecified: Secondary | ICD-10-CM | POA: Diagnosis not present

## 2022-02-24 DIAGNOSIS — J189 Pneumonia, unspecified organism: Secondary | ICD-10-CM | POA: Diagnosis not present

## 2022-02-24 DIAGNOSIS — J449 Chronic obstructive pulmonary disease, unspecified: Secondary | ICD-10-CM | POA: Diagnosis not present

## 2022-02-24 DIAGNOSIS — I10 Essential (primary) hypertension: Secondary | ICD-10-CM | POA: Diagnosis not present

## 2022-02-26 NOTE — Progress Notes (Deleted)
Cardiology Clinic Note   Patient Name: Rachel [name] Date of Encounter: 02/26/2022  Primary Care Provider:  Redmond School, MD Primary Cardiologist:  Elouise Munroe, MD  Patient Profile    Rachel [name] is a 61 year old female patient with history of COPD, hypertension, OSA, hyperlipidemia, hypothyroidism , chronic diastolic CHF.  Recent hospitalization August 2023 with cough shortness of breath and excessive fatigue.  She was diagnosed with hypoxic respiratory failure and sepsis due to community-acquired pneumonia.  She was treated with 7 days of IV antibiotics.  She was noted to have elevated troponin due to demand mismatch from hypoxia with no significant EKG changes indicative of ACS.  She was also noted to have a reduction in her EF to 40% with global hypokinesis and a new left bundle branch block.  Coronary CT scan was completed revealing distal left main and proximal RCA along with proximal LAD disease.  She is status post cardiac catheterization which revealed mild to moderate coronary artery disease without need for intervention.  She was started on losartan, metoprolol, and Aldactone. Discharge wt. 200 lbs.   Past Medical History    Past Medical History:  Diagnosis Date   Anemia    Anxiety    Arthritis    in both knees   Depression    GERD (gastroesophageal reflux disease)    Hyperlipidemia    Hypertension    Sleep apnea    Past Surgical History:  Procedure Laterality Date   CARPAL TUNNEL RELEASE Right    CATARACT EXTRACTION W/PHACO Right 02/19/2020   Procedure: CATARACT EXTRACTION PHACO AND INTRAOCULAR LENS PLACEMENT ([place]);  Surgeon: Baruch Goldmann, MD;  Location: [facility];  Service: Ophthalmology;  Laterality: Right;  CDE: 4.83   CESAREAN SECTION     cesarian  1990   COLONOSCOPY N/A 08/10/2016   Procedure: COLONOSCOPY;  Surgeon: Danie Binder, MD;  Location: [facility];  Service: Endoscopy;  Laterality: N/A;  10:30 AM   fallopian tube removed N/A    LEFT  HEART CATH AND CORONARY ANGIOGRAPHY N/A 02/19/2022   Procedure: LEFT HEART CATH AND CORONARY ANGIOGRAPHY;  Surgeon: Troy Sine, MD;  Location: [facility];  Service: Cardiovascular;  Laterality: N/A;   TOTAL KNEE ARTHROPLASTY Left 03/18/2021   Procedure: TOTAL KNEE ARTHROPLASTY;  Surgeon: Carole Civil, MD;  Location: [facility];  Service: Orthopedics;  Laterality: Left;    Allergies  Allergies  Allergen Reactions   Hydrocodone Nausea And Vomiting   Oxycodone Nausea And Vomiting    Codeine, hydrocodone, most pain pills.   Penicillins Itching, Nausea And Vomiting and Swelling    Has patient had a PCN reaction causing immediate rash, facial/tongue/throat swelling, SOB or lightheadedness with hypotension: Yes, around the eyes Has patient had a PCN reaction causing severe rash involving mucus membranes or skin necrosis: No Has patient had a PCN reaction that required hospitalization: No Has patient had a PCN reaction occurring within the last 10 years: No  If all of the above answers are "NO", then may proceed with Cephalosporin use.    Precedex [Dexmedetomidine Hcl In Nacl] Other (See Comments)    Increased agitation/confusion in ICU [April 2018]   Seroquel [Quetiapine] Other (See Comments)    More agitated/confused per daughter Rachel [name] when given in the past.   Shellfish Allergy Itching    History of Present Illness    Rachel [name] comes today for post hospitalization follow up after admission for acute respiratory failure in the setting of pneumonia, was found  to have NSTEMI. Coronary CT scan was completed revealing distal left main and proximal RCA along with proximal LAD disease.  She is status post cardiac catheterization which revealed mild to moderate coronary artery disease without need for intervention.  She was also diagnosed with NICM, systolic CHF, EF of 54% with global hypokineses.   Home Medications    Current Outpatient Medications  Medication Sig Dispense  Refill   acetaminophen (TYLENOL) 325 MG tablet Take 2 tablets (650 mg total) by mouth every 6 (six) hours as needed for headache or mild pain.     albuterol (PROVENTIL) (2.5 MG/3ML) 0.083% nebulizer solution Take 2.5 mg by nebulization every 4 (four) hours as needed for wheezing or shortness of breath.     aspirin EC 81 MG tablet Take 1 tablet (81 mg total) by mouth daily. Swallow whole. 30 tablet 0   clonazePAM (KLONOPIN) 1 MG tablet Take 1 mg by mouth 3 (three) times daily as needed for anxiety.     diclofenac Sodium (VOLTAREN) 1 % GEL Apply 2 g topically 3 (three) times daily as needed (pain).     fluticasone (FLONASE) 50 MCG/ACT nasal spray Place 1 spray into both nostrils daily.     ipratropium (ATROVENT) 0.02 % nebulizer solution Take 0.25 mg by nebulization every 6 (six) hours as needed for wheezing or shortness of breath.     Ketotifen Fumarate (ALLERGY EYE DROPS OP) Place 1 drop into both eyes daily as needed (allergies).     losartan (COZAAR) 25 MG tablet Take 1 tablet (25 mg total) by mouth daily. 30 tablet 0   Melatonin 10 MG TABS Take 20 mg by mouth at bedtime as needed (sleep).     methimazole (TAPAZOLE) 5 MG tablet Take 1 tablet (5 mg total) by mouth 2 (two) times daily. 60 tablet 0   metoprolol succinate (TOPROL-XL) 100 MG 24 hr tablet Take 1 tablet (100 mg total) by mouth daily. Take with or immediately following a meal. 30 tablet 0   rosuvastatin (CRESTOR) 10 MG tablet Take 1 tablet (10 mg total) by mouth daily. 30 tablet 0   spironolactone (ALDACTONE) 25 MG tablet Take 1 tablet (25 mg total) by mouth daily. 30 tablet 0   Current Facility-Administered Medications  Medication Dose Route Frequency Provider Last Rate Last Admin   bupivacaine-meloxicam ER (ZYNRELEF) injection 400 mg  400 mg Infiltration Once Carole Civil, MD         Family History    Family History  Problem Relation Age of Onset   Cancer Mother        lung   Heart attack Father    Stroke Paternal  Grandmother    Kidney failure Maternal Grandmother    Cancer Maternal Grandfather        lung   Seizures Brother    Sarcoidosis Sister    She indicated that her mother is deceased. She indicated that her father is deceased. She indicated that only one of her two sisters is alive. She indicated that her brother is deceased. She indicated that her maternal grandmother is deceased. She indicated that her maternal grandfather is deceased. She indicated that her paternal grandmother is deceased. She indicated that her paternal grandfather is deceased.  Social History    Social History   Socioeconomic History   Marital status: Single    Spouse name: Not on file   Number of children: Not on file   Years of education: Not on file   Highest education level:  Not on file  Occupational History   Occupation: CNA    Comment: [employer]  Tobacco Use   Smoking status: Former    Packs/day: 1.00    Years: 30.00    Total pack years: 30.00    Types: Cigarettes    Quit date: 09/27/2016    Years since quitting: 5.4   Smokeless tobacco: Never  Vaping Use   Vaping Use: Never used  Substance and Sexual Activity   Alcohol use: Yes    Comment: rarely.last used x 3 days   Drug use: Not Currently    Comment: hx of marijuana use   Sexual activity: Not on file  Other Topics Concern   Not on file  Social History Narrative   Right handed. Single.  3 kids.  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  Caffeine 3 16 oz coffee, 4 sodas   Social Determinants of Health   Financial Resource Strain: Low Risk  (06/07/2018)   Overall Financial Resource Strain (CARDIA)    Difficulty of Paying Living Expenses: Not hard at all  Food Insecurity: No Food Insecurity (06/07/2018)   Hunger Vital Sign    Worried About Running Out of Food in the Last Year: Never true    Ran Out of Food in the Last Year: Never true  Transportation Needs: No Transportation Needs (06/07/2018)   PRAPARE - Radiographer, therapeutic (Medical): No    Lack of Transportation (Non-Medical): No  Physical Activity: Insufficiently Active (06/07/2018)   Exercise Vital Sign    Days of Exercise per Week: 1 day    Minutes of Exercise per Session: 20 min  Stress: Stress Concern Present (06/07/2018)   [place]    Feeling of Stress : To some extent  Social Connections: Somewhat Isolated (06/07/2018)   Social Connection and Isolation Panel [NHANES]    Frequency of Communication with Friends and Family: Three times a week    Frequency of Social Gatherings with Friends and Family: Three times a week    Attends Religious Services: 1 to 4 times per year    Active Member of Clubs or Organizations: No    Attends Archivist Meetings: Never    Marital Status: Never married  Intimate Partner Violence: Not At Risk (06/07/2018)   Humiliation, Afraid, Rape, and Kick questionnaire    Fear of Current or Ex-Partner: No    Emotionally Abused: No    Physically Abused: No    Sexually Abused: No     Review of Systems    General:  No chills, fever, night sweats or weight changes.  Cardiovascular:  No chest pain, dyspnea on exertion, edema, orthopnea, palpitations, paroxysmal nocturnal dyspnea. Dermatological: No rash, lesions/masses Respiratory: No cough, dyspnea Urologic: No hematuria, dysuria Abdominal:   No nausea, vomiting, diarrhea, bright red blood per rectum, melena, or hematemesis Neurologic:  No visual changes, wkns, changes in mental status. All other systems reviewed and are otherwise negative except as noted above.     Physical Exam    VS:  There were no vitals taken for this visit. , BMI There is no height or weight on file to calculate BMI.     GEN: Well nourished, well developed, in no acute distress. HEENT: normal. Neck: Supple, no JVD, carotid bruits, or masses. Cardiac: RRR, no murmurs, rubs, or gallops. No clubbing,  cyanosis, edema.  Radials/DP/PT 2+ and equal bilaterally.  Respiratory:  Respirations regular and unlabored, clear  to auscultation bilaterally. GI: Soft, nontender, nondistended, BS + x 4. MS: no deformity or atrophy. Skin: warm and dry, no rash. Neuro:  Strength and sensation are intact. Psych: Normal affect.  Accessory Clinical Findings    ECG personally reviewed by me today- *** - No acute changes  Lab Results  Component Value Date   WBC 8.5 02/19/2022   HGB 9.8 (L) 02/19/2022   HCT 29.9 (L) 02/19/2022   MCV 84.7 02/19/2022   PLT 523 (H) 02/19/2022   Lab Results  Component Value Date   CREATININE 0.52 02/19/2022   BUN 11 02/19/2022   NA 139 02/19/2022   K 3.9 02/19/2022   CL 105 02/19/2022   CO2 23 02/19/2022   Lab Results  Component Value Date   ALT 18 02/19/2022   AST 14 (L) 02/19/2022   ALKPHOS 80 02/19/2022   BILITOT 0.6 02/19/2022   Lab Results  Component Value Date   TRIG 179 (H) 10/21/2016    No results found for: "HGBA1C"  Review of Prior Studies: Echocardiogram 02/15/2022 1. Left ventricular ejection fraction, by estimation, is 40 to 45%. The left ventricle has mildly decreased function. The left ventricle demonstrates global hypokinesis. There is mild concentric left ventricular hypertrophy. Left ventricular diastolic parameters are consistent with Grade II diastolic dysfunction (pseudonormalization). Elevated left atrial pressure.  2. Right ventricular systolic function is normal. The right ventricular size is normal. Tricuspid regurgitation signal is inadequate for assessing PA pressure.  3. Left atrial size was mildly dilated.  4. The mitral valve is normal in structure. Mild to moderate mitral valve regurgitation.  5. The aortic valve is tricuspid. Aortic valve regurgitation is not visualized. Comparison(s): A prior study was performed on 10/12/2016. Prior images unable to be directly viewed, comparison made by report only. The left ventricular function is  worsened. LBBB is also new since 2018.  Coronary CTA: 02/18/2022 Coronary calcium score: The patient's coronary artery calcium score is 850, which places the patient in the 99th percentile.   Coronary arteries: Normal coronary origins.  Right dominance.   Right Coronary Artery: Normal caliber vessel, gives rise to PDA. Proximal calcified plaque, but there is artifact in this area, cannot determine degree of stenosis.   Left Main Coronary Artery: Normal caliber vessel. Proximal calcified plaque with 1-10% stenosis, distal mixed calcified and noncalcified plaque concerning for >50% stenosis but not well visualized.   Left Anterior Descending Coronary Artery: Normal caliber vessel. Diffuse proximal and mid mixed calcified and noncalcified plaque, not well visualized but concerning for at least >50% stenosis.   Left Circumflex Artery: Normal caliber vessel. Not well visualized, minimal scattered calcified plaque.   Aorta: Normal size, 32 mm at the mid ascending aorta (level of the PA bifurcation) measured double oblique. Aortic atherosclerosis. No dissection seen in visualized portions of the aorta.   Aortic Valve: No calcifications. Trileaflet.  Left Heart Cath 02/19/2022 Prox RCA-1 lesion is 20% stenosed.   Prox RCA-2 lesion is 20% stenosed.   Mid RCA to Dist RCA lesion is 20% stenosed.   Acute Mrg lesion is 60% stenosed.   2nd Mrg lesion is 30% stenosed.   Prox LAD lesion is 5% stenosed.      Assessment & Plan   1.  ***    Current medicines are reviewed at length with the patient today.  I have spent *** min's  dedicated to the care of this patient on the date of this encounter to include pre-visit review of records, assessment, management and  diagnostic testing,with shared decision making. Signed, Phill Myron. West Pugh, ANP, AACC   02/26/2022 1:54 PM      Office [phone] Fax [phone]  Notice: This dictation was prepared with Dragon dictation along with  smaller phrase technology. Any transcriptional errors that result from this process are unintentional and may not be corrected upon review.

## 2022-02-27 ENCOUNTER — Ambulatory Visit: Payer: 59 | Attending: Adult Health | Admitting: Adult Health

## 2022-03-04 NOTE — Telephone Encounter (Signed)
Patient contacted regarding discharge from [facility] on 02/19/22.  Patient understands to follow up with provider PT WAS NOT AWARE OF APPT AND WAS OUT OF TOWN   Patient understands discharge instructions? YES Patient understands medications and regiment? YES Patient understands to bring all medications to this visit? YES  Ask patient:  Are you enrolled in My Chart YES  MESSAGE SENT TO [place] TO CALL PT AND MAKE [facility] APPT

## 2022-03-05 ENCOUNTER — Encounter: Payer: Self-pay | Admitting: Orthopedic Surgery

## 2022-03-05 ENCOUNTER — Ambulatory Visit: Payer: 59 | Admitting: Orthopedic Surgery

## 2022-03-08 ENCOUNTER — Emergency Department (HOSPITAL_COMMUNITY): Payer: 59

## 2022-03-08 ENCOUNTER — Encounter (HOSPITAL_COMMUNITY): Payer: Self-pay

## 2022-03-08 ENCOUNTER — Emergency Department (HOSPITAL_COMMUNITY)
Admission: EM | Admit: 2022-03-08 | Discharge: 2022-03-08 | Disposition: A | Discharge status: Home / Self Care | Payer: 59 | Attending: Emergency Medicine | Admitting: Emergency Medicine

## 2022-03-08 ENCOUNTER — Other Ambulatory Visit: Payer: Self-pay

## 2022-03-08 DIAGNOSIS — Z79899 Other long term (current) drug therapy: Secondary | ICD-10-CM | POA: Diagnosis not present

## 2022-03-08 DIAGNOSIS — Z7951 Long term (current) use of inhaled steroids: Secondary | ICD-10-CM | POA: Diagnosis not present

## 2022-03-08 DIAGNOSIS — U071 COVID-19: Secondary | ICD-10-CM | POA: Diagnosis not present

## 2022-03-08 DIAGNOSIS — Z7982 Long term (current) use of aspirin: Secondary | ICD-10-CM | POA: Diagnosis not present

## 2022-03-08 DIAGNOSIS — I10 Essential (primary) hypertension: Secondary | ICD-10-CM | POA: Diagnosis not present

## 2022-03-08 DIAGNOSIS — R059 Cough, unspecified: Secondary | ICD-10-CM | POA: Diagnosis not present

## 2022-03-08 LAB — RESP PANEL BY RT-PCR (FLU A&B, COVID) ARPGX2
Influenza A by PCR: NEGATIVE
Influenza B by PCR: NEGATIVE
SARS Coronavirus 2 by RT PCR: POSITIVE — AB

## 2022-03-08 MED ORDER — NIRMATRELVIR/RITONAVIR (PAXLOVID)TABLET
3.0000 | ORAL_TABLET | Freq: Two times a day (BID) | ORAL | Status: DC
  Administered 2022-03-08: 3 via ORAL
  Filled 2022-03-08: qty 30

## 2022-03-08 NOTE — ED Provider Notes (Signed)
[facility] EMERGENCY DEPARTMENT Provider Note   CSN: [id number] Arrival date & time: 03/08/22  1554     History {Add pertinent medical, surgical, social history, OB history to HPI:1} Chief Complaint  Patient presents with   Generalized Body Aches    Rachel [name] is a 61 y.o. female.  Patient complains of weakness and a mild cough.  She has a history of pneumonia   Weakness      Home Medications Prior to Admission medications   Medication Sig Start Date End Date Taking? Authorizing Provider  acetaminophen (TYLENOL) 325 MG tablet Take 2 tablets (650 mg total) by mouth every 6 (six) hours as needed for headache or mild pain. 02/19/22   Elgergawy, Silver Huguenin, MD  albuterol (PROVENTIL) (2.5 MG/3ML) 0.083% nebulizer solution Take 2.5 mg by nebulization every 4 (four) hours as needed for wheezing or shortness of breath. 09/25/21   [provider]  aspirin EC 81 MG tablet Take 1 tablet (81 mg total) by mouth daily. Swallow whole. 02/19/22 03/21/22  Elgergawy, Silver Huguenin, MD  clonazePAM (KLONOPIN) 1 MG tablet Take 1 mg by mouth 3 (three) times daily as needed for anxiety.    [provider]  diclofenac Sodium (VOLTAREN) 1 % GEL Apply 2 g topically 3 (three) times daily as needed (pain).    [provider]  fluticasone (FLONASE) 50 MCG/ACT nasal spray Place 1 spray into both nostrils daily. 02/12/22   [provider]  ipratropium (ATROVENT) 0.02 % nebulizer solution Take 0.25 mg by nebulization every 6 (six) hours as needed for wheezing or shortness of breath. 12/21/21   [provider]  Ketotifen Fumarate (ALLERGY EYE DROPS OP) Place 1 drop into both eyes daily as needed (allergies).    [provider]  losartan (COZAAR) 25 MG tablet Take 1 tablet (25 mg total) by mouth daily. 02/19/22   Elgergawy, Silver Huguenin, MD  Melatonin 10 MG TABS Take 20 mg by mouth at bedtime as needed (sleep).    [provider]  methimazole (TAPAZOLE) 5 MG  tablet Take 1 tablet (5 mg total) by mouth 2 (two) times daily. 02/19/22   Elgergawy, Silver Huguenin, MD  metoprolol succinate (TOPROL-XL) 100 MG 24 hr tablet Take 1 tablet (100 mg total) by mouth daily. Take with or immediately following a meal. 02/19/22   Elgergawy, Silver Huguenin, MD  rosuvastatin (CRESTOR) 10 MG tablet Take 1 tablet (10 mg total) by mouth daily. 02/19/22   Elgergawy, Silver Huguenin, MD  spironolactone (ALDACTONE) 25 MG tablet Take 1 tablet (25 mg total) by mouth daily. 02/19/22   Elgergawy, Silver Huguenin, MD      Allergies    Hydrocodone, Oxycodone, Penicillins, Precedex [dexmedetomidine hcl in nacl], Seroquel [quetiapine], and Shellfish allergy    Review of Systems   Review of Systems  Neurological:  Positive for weakness.    Physical Exam Updated Vital Signs BP 114/70 (BP Location: Right Arm)   Pulse 88   Temp 99.1 F (37.3 C) (Oral)   Resp 16   Ht 5' (1.524 m)   Wt 90.7 kg   SpO2 100%   BMI 39.06 kg/m  Physical Exam  ED Results / Procedures / Treatments   Labs (all labs ordered are listed, but only abnormal results are displayed) Labs Reviewed  RESP PANEL BY RT-PCR (FLU A&B, COVID) ARPGX2 - Abnormal; Notable for the following components:      Result Value   SARS Coronavirus 2 by RT PCR POSITIVE (*)  All other components within normal limits    EKG None  Radiology DG Chest 2 View  Result Date: 03/08/2022 CLINICAL DATA:  Cough EXAM: CHEST - 2 VIEW COMPARISON:  02/15/2022 FINDINGS: Elevation of the right hemidiaphragm. Linear right basilar opacities, unchanged dating back to 2019 compatible with scarring. Left lung clear. Heart is normal size. No effusions or acute bony abnormality. IMPRESSION: Chronic elevation of the right hemidiaphragm with right basilar scarring. No active disease. Electronically Signed   By: Rolm Baptise M.D.   On: 03/08/2022 17:04    Procedures Procedures  {Document cardiac monitor, telemetry assessment procedure when appropriate:1}  Medications  Ordered in ED Medications  nirmatrelvir/ritonavir EUA (PAXLOVID) 3 tablet (has no administration in time range)    ED Course/ Medical Decision Making/ A&P                           Medical Decision Making Amount and/or Complexity of Data Reviewed Radiology: ordered.   Patient with COVID.  She is started on pack COVID and will follow-up with her doctor as needed  {Document critical care time when appropriate:1} {Document review of labs and clinical decision tools ie heart score, Chads2Vasc2 etc:1}  {Document your independent review of radiology images, and any outside records:1} {Document your discussion with family members, caretakers, and with consultants:1} {Document social determinants of health affecting pt's care:1} {Document your decision making why or why not admission, treatments were needed:1} Final Clinical Impression(s) / ED Diagnoses Final diagnoses:  COVID    Rx / DC Orders ED Discharge Orders     None

## 2022-03-08 NOTE — Discharge Instructions (Signed)
Take Tylenol drink plenty fluids and follow-up with your doctor if any problem

## 2022-03-08 NOTE — ED Triage Notes (Signed)
Pt presents to ED with complaints of productive cough, generalized body aches started Friday

## 2022-03-23 DIAGNOSIS — E118 Type 2 diabetes mellitus with unspecified complications: Secondary | ICD-10-CM | POA: Diagnosis not present

## 2022-03-23 DIAGNOSIS — I10 Essential (primary) hypertension: Secondary | ICD-10-CM | POA: Diagnosis not present

## 2022-03-23 DIAGNOSIS — J449 Chronic obstructive pulmonary disease, unspecified: Secondary | ICD-10-CM | POA: Diagnosis not present

## 2022-03-23 DIAGNOSIS — R69 Illness, unspecified: Secondary | ICD-10-CM | POA: Diagnosis not present

## 2022-03-23 DIAGNOSIS — Z6841 Body Mass Index (BMI) 40.0 and over, adult: Secondary | ICD-10-CM | POA: Diagnosis not present

## 2022-03-31 ENCOUNTER — Ambulatory Visit: Payer: 59 | Admitting: Physician Assistant

## 2022-03-31 NOTE — Progress Notes (Deleted)
Cardiology Office Note:    Date:  03/31/2022   ID:  [name], DOB 1961-04-07, MRN [id number]  PCP:  Redmond School, [place] Providers Cardiologist:  Elouise Munroe, MD { Click to update primary MD,subspecialty MD or APP then REFRESH:1}    Referring MD: Redmond School, MD   No chief complaint on file. ***  History of Present Illness:    Rachel [name] is a 61 y.o. female with a hx of chronic systolic and diastolic heart failure with LVEF 40-45% and grade 2 DD, moderate MR, hypertension, hyperlipidemia, COPD, OSA, and GERD.  She was hospitalized 09/2016 with acute hypoxic respiratory failure secondary from influenza and superimposed bacterial infection with ARDS and septic shock.  Echocardiogram that admission showed a preserved LVEF of 60 to 65%, moderate LVH, and grade 2 DD.  She was hospitalized 02/13/2022 with sepsis secondary to PNA.  EKG with sinus tachycardia and new LBBB.  Echocardiogram was ordered to evaluate further and found an LVEF 40-45% with global hypokinesis and grade 2 diastolic dysfunction as well as mild to moderate MR. Cardiology was consulted. She underwent CT coronary that showed an elevated coronary calcium score with suspected three-vessel disease but could not be interpreted more specifically.  She underwent left heart catheterization 02/19/2022.  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  LVEDP was 29 mmHg. GDMT was titrated to include losartan 25 mg, 100 mg toprol, and 25 mg spironolactone. Plan to consider SGLT2i at follow up.  Cardiomyopathy may be related to hypertension or LBBB.  She unfortunately contracted COVID-19 03/08/22 but did not require hospitalization.  She presents today for general cardiology follow-up.       Chronic systolic and diastolic heart failure Nonischemic cardiomyopathy HTN LBBB Question role of hypertension or LBBB GDMT includes 25 mg losartan, 100 mg Toprol, and 25 mg  spironolactone May consider SGLT2 inhibitor depending on cost Question need for cardiac MRI     She will be seen once in [place] but then follow-up in [place].     Past Medical History:  Diagnosis Date   Anemia    Anxiety    Arthritis    in both knees   Depression    GERD (gastroesophageal reflux disease)    Hyperlipidemia    Hypertension    Sleep apnea     Past Surgical History:  Procedure Laterality Date   CARPAL TUNNEL RELEASE Right    CATARACT EXTRACTION W/PHACO Right 02/19/2020   Procedure: CATARACT EXTRACTION PHACO AND INTRAOCULAR LENS PLACEMENT ([place]);  Surgeon: Baruch Goldmann, MD;  Location: [facility];  Service: Ophthalmology;  Laterality: Right;  CDE: 4.83   CESAREAN SECTION     cesarian  1990   COLONOSCOPY N/A 08/10/2016   Procedure: COLONOSCOPY;  Surgeon: Danie Binder, MD;  Location: [facility];  Service: Endoscopy;  Laterality: N/A;  10:30 AM   fallopian tube removed N/A    LEFT HEART CATH AND CORONARY ANGIOGRAPHY N/A 02/19/2022   Procedure: LEFT HEART CATH AND CORONARY ANGIOGRAPHY;  Surgeon: Troy Sine, MD;  Location: [facility];  Service: Cardiovascular;  Laterality: N/A;   TOTAL KNEE ARTHROPLASTY Left 03/18/2021   Procedure: TOTAL KNEE ARTHROPLASTY;  Surgeon: Carole Civil, MD;  Location: [facility];  Service: Orthopedics;  Laterality: Left;    Current Medications: No outpatient medications have been marked as taking for the 04/06/22 encounter (Appointment) with Ledora Bottcher, [place].   Current Facility-Administered Medications for the 04/06/22 encounter (Appointment)  with Ledora Bottcher, PA  Medication   bupivacaine-meloxicam ER (ZYNRELEF) injection 400 mg     Allergies:   Hydrocodone, Oxycodone, Penicillins, Precedex [dexmedetomidine hcl in nacl], Seroquel [quetiapine], and Shellfish allergy   Social History   Socioeconomic History   Marital status: Single    Spouse name: Not on file   Number of children: Not on file    Years of education: Not on file   Highest education level: Not on file  Occupational History   Occupation: CNA    Comment: [employer]  Tobacco Use   Smoking status: Former    Packs/day: 1.00    Years: 30.00    Total pack years: 30.00    Types: Cigarettes    Quit date: 09/27/2016    Years since quitting: 5.5   Smokeless tobacco: Never  Vaping Use   Vaping Use: Never used  Substance and Sexual Activity   Alcohol use: Yes    Comment: rarely.last used x 3 days   Drug use: Not Currently    Comment: hx of marijuana use   Sexual activity: Not on file  Other Topics Concern   Not on file  Social History Narrative   Right handed. Single.  3 kids.  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  Caffeine 3 16 oz coffee, 4 sodas   Social Determinants of Health   Financial Resource Strain: Low Risk  (06/07/2018)   Overall Financial Resource Strain (CARDIA)    Difficulty of Paying Living Expenses: Not hard at all  Food Insecurity: No Food Insecurity (06/07/2018)   Hunger Vital Sign    Worried About Running Out of Food in the Last Year: Never true    Ran Out of Food in the Last Year: Never true  Transportation Needs: No Transportation Needs (06/07/2018)   PRAPARE - Hydrologist (Medical): No    Lack of Transportation (Non-Medical): No  Physical Activity: Insufficiently Active (06/07/2018)   Exercise Vital Sign    Days of Exercise per Week: 1 day    Minutes of Exercise per Session: 20 min  Stress: Stress Concern Present (06/07/2018)   [place]    Feeling of Stress : To some extent  Social Connections: Somewhat Isolated (06/07/2018)   Social Connection and Isolation Panel [NHANES]    Frequency of Communication with Friends and Family: Three times a week    Frequency of Social Gatherings with Friends and Family: Three times a week    Attends Religious Services: 1 to 4 times per year    Active Member  of Clubs or Organizations: No    Attends Archivist Meetings: Never    Marital Status: Never married     Family History: The patient's ***family history includes Cancer in her maternal grandfather and mother; Heart attack in her father; Kidney failure in her maternal grandmother; Sarcoidosis in her sister; Seizures in her brother; Stroke in her paternal grandmother.  ROS:   Please see the history of present illness.    *** All other systems reviewed and are negative.  EKGs/Labs/Other Studies Reviewed:    The following studies were reviewed today:  Left heart cath 02/19/22:   Prox RCA-1 lesion is 20% stenosed.   Prox RCA-2 lesion is 20% stenosed.   Mid RCA to Dist RCA lesion is 20% stenosed.   Acute Mrg lesion is 60% stenosed.   2nd Mrg lesion is 30% stenosed.   Prox LAD lesion  is 5% stenosed.   Mild to moderate multivessel coronary calcification with mild-moderate nonobstructive CAD.   LV EDP 29 mmHg   RECOMMENDATION: Guideline directed medical therapy for HFrEF.  Aggressive lipid-lowering therapy with target LDL less than 70.   CT coronary 02/18/22: IMPRESSION: 1. Nondiagnostic study, CADRADS=N. Suspect significant CAD based on limited evaluation, including distal LM, proximal LAD, and proximal RCA. Suggest cardiac catheterization for definitive evaluation.   2. Coronary calcium score of 850. This was 99th percentile for age and sex matched control.   3. Normal coronary origin with right dominance.   4.  Aortic atherosclerosis   5.  Elevated right hemidiaphragm.    Echo 02/15/22:  1. Left ventricular ejection fraction, by estimation, is 40 to 45%. The  left ventricle has mildly decreased function. The left ventricle  demonstrates global hypokinesis. There is mild concentric left ventricular  hypertrophy. Left ventricular diastolic  parameters are consistent with Grade II diastolic dysfunction  (pseudonormalization). Elevated left atrial pressure.   2.  Right ventricular systolic function is normal. The right ventricular  size is normal. Tricuspid regurgitation signal is inadequate for assessing  PA pressure.   3. Left atrial size was mildly dilated.   4. The mitral valve is normal in structure. Mild to moderate mitral valve  regurgitation.   5. The aortic valve is tricuspid. Aortic valve regurgitation is not  visualized.    EKG:  EKG is *** ordered today.  The ekg ordered today demonstrates ***  Recent Labs: 02/14/2022: TSH 0.105 02/19/2022: ALT 18; B Natriuretic Peptide 323.6; BUN 11; Creatinine, Ser 0.52; Hemoglobin 9.8; Magnesium 1.9; Platelets 523; Potassium 3.9; Sodium 139  Recent Lipid Panel    Component Value Date/Time   TRIG 179 (H) 10/21/2016 2039     Risk Assessment/Calculations:   {Does this patient have ATRIAL FIBRILLATION?[phone]}  No BP recorded.  {Refresh Note OR Click here to enter BP  :1}***         Physical Exam:    VS:  There were no vitals taken for this visit.    Wt Readings from Last 3 Encounters:  03/08/22 200 lb (90.7 kg)  02/13/22 199 lb 15.3 oz (90.7 kg)  03/13/21 200 lb (90.7 kg)     GEN: *** Well nourished, well developed in no acute distress HEENT: Normal NECK: No JVD; No carotid bruits LYMPHATICS: No lymphadenopathy CARDIAC: ***RRR, no murmurs, rubs, gallops RESPIRATORY:  Clear to auscultation without rales, wheezing or rhonchi  ABDOMEN: Soft, non-tender, non-distended MUSCULOSKELETAL:  No edema; No deformity  SKIN: Warm and dry NEUROLOGIC:  Alert and oriented x 3 PSYCHIATRIC:  Normal affect   ASSESSMENT:    No diagnosis found. PLAN:    In order of problems listed above:  ***      {Are you ordering a CV Procedure (e.g. stress test, cath, DCCV, TEE, etc)?   Press F2        :[id number]}    Medication Adjustments/Labs and Tests Ordered: Current medicines are reviewed at length with the patient today.  Concerns regarding medicines are outlined above.  No orders of the  defined types were placed in this encounter.  No orders of the defined types were placed in this encounter.   There are no Patient Instructions on file for this visit.   Signed, [place], PA  03/31/2022 2:28 PM    [facility]

## 2022-04-05 DIAGNOSIS — R0989 Other specified symptoms and signs involving the circulatory and respiratory systems: Secondary | ICD-10-CM | POA: Insufficient documentation

## 2022-04-05 DIAGNOSIS — R072 Precordial pain: Secondary | ICD-10-CM | POA: Diagnosis not present

## 2022-04-05 DIAGNOSIS — J189 Pneumonia, unspecified organism: Secondary | ICD-10-CM | POA: Diagnosis not present

## 2022-04-05 DIAGNOSIS — I2510 Atherosclerotic heart disease of native coronary artery without angina pectoris: Secondary | ICD-10-CM | POA: Insufficient documentation

## 2022-04-05 DIAGNOSIS — I5023 Acute on chronic systolic (congestive) heart failure: Secondary | ICD-10-CM | POA: Insufficient documentation

## 2022-04-05 DIAGNOSIS — F419 Anxiety disorder, unspecified: Secondary | ICD-10-CM | POA: Insufficient documentation

## 2022-04-05 DIAGNOSIS — R059 Cough, unspecified: Secondary | ICD-10-CM | POA: Diagnosis not present

## 2022-04-05 DIAGNOSIS — R079 Chest pain, unspecified: Secondary | ICD-10-CM | POA: Diagnosis not present

## 2022-04-05 DIAGNOSIS — J90 Pleural effusion, not elsewhere classified: Secondary | ICD-10-CM | POA: Diagnosis not present

## 2022-04-05 DIAGNOSIS — J9811 Atelectasis: Secondary | ICD-10-CM | POA: Diagnosis not present

## 2022-04-05 DIAGNOSIS — R0602 Shortness of breath: Secondary | ICD-10-CM | POA: Diagnosis not present

## 2022-04-05 DIAGNOSIS — R778 Other specified abnormalities of plasma proteins: Secondary | ICD-10-CM | POA: Diagnosis not present

## 2022-04-05 DIAGNOSIS — I5043 Acute on chronic combined systolic (congestive) and diastolic (congestive) heart failure: Secondary | ICD-10-CM | POA: Diagnosis not present

## 2022-04-05 DIAGNOSIS — R0902 Hypoxemia: Secondary | ICD-10-CM | POA: Diagnosis not present

## 2022-04-06 ENCOUNTER — Ambulatory Visit: Payer: 59 | Admitting: Physician Assistant

## 2022-04-09 NOTE — Progress Notes (Signed)
Cardiology Clinic Note   Patient Name: Rachel [name] Date of Encounter: 04/10/2022  Primary Care Provider:  Redmond School, MD Primary Cardiologist:  Elouise Munroe, MD  Patient Profile    61 year old female with history of Grade II diastolic dysfunction, HFrEF, non-obstructive CAD per cath on 02/19/2022, HTN, HL, Hyperthyroidism, COPD, GERD, and OSA. Initially seen by Dr.Acharya during hospital consult on 02/17/2022. Echo revealed EF of 45%, with global hypokinesis and Grade 2 diastolic dysfunction with moderate MR. EKG revealed new LBBB. The patient was admitted with pneumonia and sepsis, treated by Hospitalist service for this with IV antibiotics. She was started on losartan, metoprolol, and aldactone.   Past Medical History    Past Medical History:  Diagnosis Date   Anemia    Anxiety    Arthritis    in both knees   Depression    GERD (gastroesophageal reflux disease)    Hyperlipidemia    Hypertension    Sleep apnea    Past Surgical History:  Procedure Laterality Date   CARPAL TUNNEL RELEASE Right    CATARACT EXTRACTION W/PHACO Right 02/19/2020   Procedure: CATARACT EXTRACTION PHACO AND INTRAOCULAR LENS PLACEMENT ([place]);  Surgeon: Baruch Goldmann, MD;  Location: [facility];  Service: Ophthalmology;  Laterality: Right;  CDE: 4.83   CESAREAN SECTION     cesarian  1990   COLONOSCOPY N/A 08/10/2016   Procedure: COLONOSCOPY;  Surgeon: Danie Binder, MD;  [facility];  Service: Endoscopy;  Laterality: N/A;  10:30 AM   fallopian tube removed N/A    LEFT HEART CATH AND CORONARY ANGIOGRAPHY N/A 02/19/2022   Procedure: LEFT HEART CATH AND CORONARY ANGIOGRAPHY;  Surgeon: Troy Sine, MD;  Location: [facility];  Service: Cardiovascular;  Laterality: N/A;   TOTAL KNEE ARTHROPLASTY Left 03/18/2021   Procedure: TOTAL KNEE ARTHROPLASTY;  Surgeon: Carole Civil, MD;  Location: [facility];  Service: Orthopedics;  Laterality: Left;    Allergies  Allergies   Allergen Reactions   Hydrocodone Nausea And Vomiting   Oxycodone Nausea And Vomiting    Codeine, hydrocodone, most pain pills.   Penicillins Itching, Nausea And Vomiting and Swelling    Has patient had a PCN reaction causing immediate rash, facial/tongue/throat swelling, SOB or lightheadedness with hypotension: Yes, around the eyes Has patient had a PCN reaction causing severe rash involving mucus membranes or skin necrosis: No Has patient had a PCN reaction that required hospitalization: No Has patient had a PCN reaction occurring within the last 10 years: No  If all of the above answers are "NO", then may proceed with Cephalosporin use.    Precedex [Dexmedetomidine Hcl In Nacl] Other (See Comments)    Increased agitation/confusion in ICU [April 2018]   Seroquel [Quetiapine] Other (See Comments)    More agitated/confused per daughter [name] when given in the past.   Shellfish Allergy Itching    History of Present Illness    Mrs. Glasscock comes today without any significant complaints.  She states she has been gaining some weight since she left the hospital but has significantly changed her eating habits to avoid sugar salt and fried foods and has maintained her weight since doing so.  She has not had any exacerbations of CHF, fluid retention, edema, or shortness of breath.  She states that she is easily sick with bronchitis since having had pneumonia.  "If I get a cold it goes to my chest", and has had several bouts of bronchitis since hospitalization.  She admits to having some issues with arthritis, and has had carpal tunnel syndrome with carpal tunnel repair in the past.  Home Medications    Current Outpatient Medications  Medication Sig Dispense Refill   acetaminophen (TYLENOL) 325 MG tablet Take 2 tablets (650 mg total) by mouth every 6 (six) hours as needed for headache or mild pain.     aspirin EC 81 MG tablet Take by mouth.     clonazePAM (KLONOPIN) 1 MG tablet Take 1 mg by mouth  3 (three) times daily as needed for anxiety.     diclofenac Sodium (VOLTAREN) 1 % GEL Apply 2 g topically 3 (three) times daily as needed (pain).     fluticasone (FLONASE) 50 MCG/ACT nasal spray Place 1 spray into both nostrils daily.     ipratropium (ATROVENT) 0.02 % nebulizer solution Take 0.25 mg by nebulization every 6 (six) hours as needed for wheezing or shortness of breath.     Ketotifen Fumarate (ALLERGY EYE DROPS OP) Place 1 drop into both eyes daily as needed (allergies).     levocetirizine (XYZAL) 5 MG tablet Take 5 mg by mouth daily.     losartan (COZAAR) 25 MG tablet Take 1 tablet (25 mg total) by mouth daily. 30 tablet 0   Melatonin 10 MG TABS Take 20 mg by mouth at bedtime as needed (sleep).     methimazole (TAPAZOLE) 5 MG tablet Take 1 tablet (5 mg total) by mouth 2 (two) times daily. 60 tablet 0   metoprolol succinate (TOPROL-XL) 100 MG 24 hr tablet Take 1 tablet (100 mg total) by mouth daily. Take with or immediately following a meal. 30 tablet 0   rosuvastatin (CRESTOR) 10 MG tablet Take 1 tablet (10 mg total) by mouth daily. 30 tablet 0   spironolactone (ALDACTONE) 25 MG tablet Take 1 tablet (25 mg total) by mouth daily. 30 tablet 0   albuterol (PROVENTIL) (2.5 MG/3ML) 0.083% nebulizer solution Take 2.5 mg by nebulization every 4 (four) hours as needed for wheezing or shortness of breath. (Patient not taking: Reported on 04/10/2022)     phentermine (ADIPEX-P) 37.5 MG tablet Take 37.5 mg by mouth daily.     Current Facility-Administered Medications  Medication Dose Route Frequency Provider Last Rate Last Admin   bupivacaine-meloxicam ER (ZYNRELEF) injection 400 mg  400 mg Infiltration Once Carole Civil, MD         Family History    Family History  Problem Relation Age of Onset   Cancer Mother        lung   Heart attack Father    Stroke Paternal Grandmother    Kidney failure Maternal Grandmother    Cancer Maternal Grandfather        lung   Seizures Brother     Sarcoidosis Sister    She indicated that her mother is deceased. She indicated that her father is deceased. She indicated that only one of her two sisters is alive. She indicated that her brother is deceased. She indicated that her maternal grandmother is deceased. She indicated that her maternal grandfather is deceased. She indicated that her paternal grandmother is deceased. She indicated that her paternal grandfather is deceased.  Social History    Social History   Socioeconomic History   Marital status: Single    Spouse name: Not on file   Number of children: Not on file   Years of education: Not on file   Highest education level: Not on file  Occupational History  Occupation: CNA    Comment: [employer]  Tobacco Use   Smoking status: Former    Packs/day: 1.00    Years: 30.00    Total pack years: 30.00    Types: Cigarettes    Quit date: 09/27/2016    Years since quitting: 5.5   Smokeless tobacco: Never  Vaping Use   Vaping Use: Never used  Substance and Sexual Activity   Alcohol use: Yes    Comment: rarely.last used x 3 days   Drug use: Not Currently    Comment: hx of marijuana use   Sexual activity: Not on file  Other Topics Concern   Not on file  Social History Narrative   Right handed. Single.  3 kids.  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  Caffeine 3 16 oz coffee, 4 sodas   Social Determinants of Health   Financial Resource Strain: Low Risk  (06/07/2018)   Overall Financial Resource Strain (CARDIA)    Difficulty of Paying Living Expenses: Not hard at all  Food Insecurity: No Food Insecurity (06/07/2018)   Hunger Vital Sign    Worried About Running Out of Food in the Last Year: Never true    Ran Out of Food in the Last Year: Never true  Transportation Needs: No Transportation Needs (06/07/2018)   PRAPARE - Hydrologist (Medical): No    Lack of Transportation (Non-Medical): No  Physical Activity: Insufficiently Active (06/07/2018)    Exercise Vital Sign    Days of Exercise per Week: 1 day    Minutes of Exercise per Session: 20 min  Stress: Stress Concern Present (06/07/2018)   [place]    Feeling of Stress : To some extent  Social Connections: Somewhat Isolated (06/07/2018)   Social Connection and Isolation Panel [NHANES]    Frequency of Communication with Friends and Family: Three times a week    Frequency of Social Gatherings with Friends and Family: Three times a week    Attends Religious Services: 1 to 4 times per year    Active Member of Clubs or Organizations: No    Attends Archivist Meetings: Never    Marital Status: Never married  Intimate Partner Violence: Not At Risk (06/07/2018)   Humiliation, Afraid, Rape, and Kick questionnaire    Fear of Current or Ex-Partner: No    Emotionally Abused: No    Physically Abused: No    Sexually Abused: No     Review of Systems    General:  No chills, fever, night sweats or weight changes.  Cardiovascular:  No chest pain, positive for occasional dyspnea on exertion, edema, orthopnea, palpitations, paroxysmal nocturnal dyspnea. Dermatological: No rash, lesions/masses Respiratory: No cough, dyspnea Urologic: No hematuria, dysuria Abdominal:   No nausea, vomiting, diarrhea, bright red blood per rectum, melena, or hematemesis Neurologic:  No visual changes, wkns, changes in mental status.  Some numbness and tingling in her legs distal to knees All other systems reviewed and are otherwise negative except as noted above.     Physical Exam    VS:  BP 132/78 (BP Location: Left Arm, Patient Position: Sitting)   Pulse 66   Wt 207 lb 12.8 oz (94.3 kg)   SpO2 98%   BMI 40.58 kg/m  , BMI Body mass index is 40.58 kg/m.     GEN: Well nourished, well developed, in no acute distress. HEENT: normal. Neck: Supple, no JVD, carotid bruits, or masses. Cardiac:  RRR occasional irregular, no murmurs,  rubs, or gallops. No clubbing, cyanosis, edema.  Radials/DP/PT 2+ and equal bilaterally.  Respiratory:  Respirations regular and unlabored, clear to auscultation with the exception of scant crackles in the bases, cleared with cough bilaterally. GI: Soft, nontender, nondistended, BS + x 4. MS: no deformity or atrophy.  Joint stiffness is noted Skin: warm and dry, no rash. Neuro:  Strength and sensation are intact. Psych: Normal affect.  Accessory Clinical Findings    ECG personally reviewed by me today-sinus rhythm, PACs.  Heart rate of 66 bpm.- No acute changes  Lab Results  Component Value Date   WBC 8.5 02/19/2022   HGB 9.8 (L) 02/19/2022   HCT 29.9 (L) 02/19/2022   MCV 84.7 02/19/2022   PLT 523 (H) 02/19/2022   Lab Results  Component Value Date   CREATININE 0.52 02/19/2022   BUN 11 02/19/2022   NA 139 02/19/2022   K 3.9 02/19/2022   CL 105 02/19/2022   CO2 23 02/19/2022   Lab Results  Component Value Date   ALT 18 02/19/2022   AST 14 (L) 02/19/2022   ALKPHOS 80 02/19/2022   BILITOT 0.6 02/19/2022   Lab Results  Component Value Date   TRIG 179 (H) 10/21/2016    No results found for: "HGBA1C"  Review of Prior Studies: Echocardiogram 02/15/2022: Impressions:  1. Left ventricular ejection fraction, by estimation, is 40 to 45%. The  left ventricle has mildly decreased function. The left ventricle  demonstrates global hypokinesis. There is mild concentric left ventricular  hypertrophy. Left ventricular diastolic  parameters are consistent with Grade II diastolic dysfunction  (pseudonormalization). Elevated left atrial pressure.   2. Right ventricular systolic function is normal. The right ventricular  size is normal. Tricuspid regurgitation signal is inadequate for assessing  PA pressure.   3. Left atrial size was mildly dilated.   4. The mitral valve is normal in structure. Mild to moderate mitral valve  regurgitation.   5. The aortic valve is tricuspid. Aortic  valve regurgitation is not  visualized.   Cardiac Cath 02/19/2022 Prox RCA-1 lesion is 20% stenosed.   Prox RCA-2 lesion is 20% stenosed.   Mid RCA to Dist RCA lesion is 20% stenosed.   Acute Mrg lesion is 60% stenosed.   2nd Mrg lesion is 30% stenosed.   Prox LAD lesion is 5% stenosed.   Mild to moderate multivessel coronary calcification with mild-moderate nonobstructive CAD.  Coronary CTA 02/18/2022  1. Nondiagnostic study, CADRADS=N. Suspect significant CAD based on limited evaluation, including distal LM, proximal LAD, and proximal RCA. Suggest cardiac catheterization for definitive evaluation.   2. Coronary calcium score of 850. This was 99th percentile for age and sex matched control.   3. Normal coronary origin with right dominance.   4.  Aortic atherosclerosis   5.  Elevated right hemidiaphragm.   Findings communicated with rounding cardiology team.    Assessment & Plan   1.  Chronic nonischemic mixed CHF (systolic diastolic) most recent echocardiogram revealed an EF of 40 to 45% with grade II diastolic dysfunction.  With other risk factors to include carpal tunnel, I am going to check her for amyloidosis and schedule a nuclear medicine PYP scan.  We will also recheck her echocardiogram in 3 more months to evaluate her status.  Echo did not show any speckling but she is obese and therefore l would like to evaluate her more thoroughly.    2.  Hypertension: Excellent control of blood pressure today.  She has had multiple labs and therefore will not repeat any new ones today.  3.  Chronic bronchitis: She states that she has had COVID twice and continues to have issues with bronchitis since hospitalizations.  She states that she catches a cold that normally falls into her chest with frequent coughing and trouble breathing.  At this time she is doing well.  We recommend her following up with the pulmonologist for ongoing assessment and maintenance.  4.  Hypercholesterolemia:  Labs are followed by PCP.   Current medicines are reviewed at length with the patient today.  I have spent 25 min's  dedicated to the care of this patient on the date of this encounter to include pre-visit review of records, assessment, management and diagnostic testing,with shared decision making.  Signed, Phill Myron. West Pugh, ANP, AACC   04/10/2022 11:49 AM      Office [phone] Fax [phone]  Notice: This dictation was prepared with Dragon dictation along with smaller phrase technology. Any transcriptional errors that result from this process are unintentional and may not be corrected upon review.

## 2022-04-10 ENCOUNTER — Ambulatory Visit: Payer: 59 | Attending: Physician Assistant | Admitting: Adult Health

## 2022-04-10 ENCOUNTER — Encounter: Payer: Self-pay | Admitting: Adult Health

## 2022-04-10 VITALS — BP 132/78 | HR 66 | Wt 207.8 lb

## 2022-04-10 DIAGNOSIS — I10 Essential (primary) hypertension: Secondary | ICD-10-CM

## 2022-04-10 NOTE — Patient Instructions (Signed)
Medication Instructions:  No Changes *If you need a refill on your cardiac medications before your next appointment, please call your pharmacy*   Lab Work: No labs If you have labs (blood work) drawn today and your tests are completely normal, you will receive your results only by: [place] (if you have MyChart) OR A paper copy in the mail If you have any lab test that is abnormal or we need to change your treatment, we will call you to review the results.   Testing/Procedures: [address] Myocardial Amyloid Imaging .    Follow-Up: At [facility], you and your health needs are our priority.  As part of our continuing mission to provide you with exceptional heart care, we have created designated Provider Care Teams.  These Care Teams include your primary Cardiologist (physician) and Advanced Practice Providers (APPs -  Physician Assistants and Nurse Practitioners) who all work together to provide you with the care you need, when you need it.  We recommend signing up for the patient portal called "MyChart".  Sign up information is provided on this After Visit Summary.  MyChart is used to connect with patients for Virtual Visits (Telemedicine).  Patients are able to view lab/test results, encounter notes, upcoming appointments, etc.  Non-urgent messages can be sent to your provider as well.   To learn more about what you can do with MyChart, go to [email or web address].    Your next appointment:   6 month(s)  The format for your next appointment:   In Person  Provider:   Elouise Munroe, MD

## 2022-05-04 ENCOUNTER — Encounter (HOSPITAL_COMMUNITY): Payer: 59

## 2022-05-08 ENCOUNTER — Encounter (HOSPITAL_COMMUNITY): Payer: 59

## 2022-05-11 ENCOUNTER — Encounter (HOSPITAL_COMMUNITY): Payer: Self-pay | Admitting: Adult Health

## 2022-05-12 DIAGNOSIS — Z6841 Body Mass Index (BMI) 40.0 and over, adult: Secondary | ICD-10-CM | POA: Diagnosis not present

## 2022-05-12 DIAGNOSIS — J449 Chronic obstructive pulmonary disease, unspecified: Secondary | ICD-10-CM | POA: Diagnosis not present

## 2022-05-12 DIAGNOSIS — R69 Illness, unspecified: Secondary | ICD-10-CM | POA: Diagnosis not present

## 2022-05-12 DIAGNOSIS — R059 Cough, unspecified: Secondary | ICD-10-CM | POA: Diagnosis not present

## 2022-05-12 DIAGNOSIS — I10 Essential (primary) hypertension: Secondary | ICD-10-CM | POA: Diagnosis not present

## 2022-05-12 DIAGNOSIS — E118 Type 2 diabetes mellitus with unspecified complications: Secondary | ICD-10-CM | POA: Diagnosis not present

## 2022-06-11 DIAGNOSIS — Z20822 Contact with and (suspected) exposure to covid-19: Secondary | ICD-10-CM | POA: Diagnosis not present

## 2022-06-11 DIAGNOSIS — R059 Cough, unspecified: Secondary | ICD-10-CM | POA: Diagnosis not present

## 2022-06-11 DIAGNOSIS — J209 Acute bronchitis, unspecified: Secondary | ICD-10-CM | POA: Diagnosis not present

## 2022-06-11 DIAGNOSIS — Z5321 Procedure and treatment not carried out due to patient leaving prior to being seen by health care provider: Secondary | ICD-10-CM | POA: Diagnosis not present

## 2022-06-11 DIAGNOSIS — R0602 Shortness of breath: Secondary | ICD-10-CM | POA: Diagnosis not present

## 2022-06-11 DIAGNOSIS — R0981 Nasal congestion: Secondary | ICD-10-CM | POA: Diagnosis not present

## 2022-06-11 DIAGNOSIS — J9811 Atelectasis: Secondary | ICD-10-CM | POA: Diagnosis not present

## 2022-07-06 DIAGNOSIS — J42 Unspecified chronic bronchitis: Secondary | ICD-10-CM | POA: Diagnosis not present

## 2022-07-14 DIAGNOSIS — R0602 Shortness of breath: Secondary | ICD-10-CM | POA: Diagnosis not present

## 2022-07-14 DIAGNOSIS — Z87891 Personal history of nicotine dependence: Secondary | ICD-10-CM | POA: Diagnosis not present

## 2022-07-14 DIAGNOSIS — R0600 Dyspnea, unspecified: Secondary | ICD-10-CM | POA: Diagnosis not present

## 2022-07-14 DIAGNOSIS — Z880 Allergy status to penicillin: Secondary | ICD-10-CM | POA: Diagnosis not present

## 2022-07-14 DIAGNOSIS — Z885 Allergy status to narcotic agent status: Secondary | ICD-10-CM | POA: Diagnosis not present

## 2022-07-17 DIAGNOSIS — J449 Chronic obstructive pulmonary disease, unspecified: Secondary | ICD-10-CM | POA: Diagnosis not present

## 2022-07-17 DIAGNOSIS — I10 Essential (primary) hypertension: Secondary | ICD-10-CM | POA: Diagnosis not present

## 2022-07-17 DIAGNOSIS — F329 Major depressive disorder, single episode, unspecified: Secondary | ICD-10-CM | POA: Diagnosis not present

## 2022-07-17 DIAGNOSIS — Z6841 Body Mass Index (BMI) 40.0 and over, adult: Secondary | ICD-10-CM | POA: Diagnosis not present

## 2022-07-17 DIAGNOSIS — E118 Type 2 diabetes mellitus with unspecified complications: Secondary | ICD-10-CM | POA: Diagnosis not present

## 2022-07-17 DIAGNOSIS — I700 Atherosclerosis of aorta: Secondary | ICD-10-CM | POA: Diagnosis not present

## 2022-07-17 DIAGNOSIS — F419 Anxiety disorder, unspecified: Secondary | ICD-10-CM | POA: Diagnosis not present

## 2022-07-23 ENCOUNTER — Other Ambulatory Visit (HOSPITAL_COMMUNITY): Payer: Self-pay | Admitting: Internal Medicine

## 2022-07-23 DIAGNOSIS — J0100 Acute maxillary sinusitis, unspecified: Secondary | ICD-10-CM | POA: Diagnosis not present

## 2022-07-23 DIAGNOSIS — J209 Acute bronchitis, unspecified: Secondary | ICD-10-CM | POA: Diagnosis not present

## 2022-07-23 DIAGNOSIS — R059 Cough, unspecified: Secondary | ICD-10-CM

## 2022-07-23 DIAGNOSIS — I700 Atherosclerosis of aorta: Secondary | ICD-10-CM | POA: Diagnosis not present

## 2022-07-23 DIAGNOSIS — F330 Major depressive disorder, recurrent, mild: Secondary | ICD-10-CM | POA: Diagnosis not present

## 2022-07-23 DIAGNOSIS — J042 Acute laryngotracheitis: Secondary | ICD-10-CM | POA: Diagnosis not present

## 2022-07-23 DIAGNOSIS — Z6839 Body mass index (BMI) 39.0-39.9, adult: Secondary | ICD-10-CM | POA: Diagnosis not present

## 2022-07-23 DIAGNOSIS — J441 Chronic obstructive pulmonary disease with (acute) exacerbation: Secondary | ICD-10-CM | POA: Diagnosis not present

## 2022-07-23 DIAGNOSIS — J449 Chronic obstructive pulmonary disease, unspecified: Secondary | ICD-10-CM | POA: Diagnosis not present

## 2022-08-13 ENCOUNTER — Telehealth: Payer: Self-pay | Admitting: Adult Health

## 2022-08-13 ENCOUNTER — Encounter: Payer: Self-pay | Admitting: *Deleted

## 2022-08-13 DIAGNOSIS — Z006 Encounter for examination for normal comparison and control in clinical research program: Secondary | ICD-10-CM

## 2022-08-13 NOTE — Telephone Encounter (Signed)
LMTCB

## 2022-08-13 NOTE — Telephone Encounter (Signed)
Pt c/o Shortness Of Breath: STAT if SOB developed within the last 24 hours or pt is noticeably SOB on the phone  1. Are you currently SOB (can you hear that pt is SOB on the phone)?  No   2. How long have you been experiencing SOB?  Since last Summer   3. Are you SOB when sitting or when up moving around?  With minimal movement  4. Are you currently experiencing any other symptoms?  No

## 2022-08-13 NOTE — Research (Signed)
Message left about information for V1P research. Encouraged ms Goody to call back with any questions.

## 2022-08-14 NOTE — Telephone Encounter (Signed)
LMTCB

## 2022-08-17 ENCOUNTER — Telehealth: Payer: Self-pay | Admitting: Internal Medicine

## 2022-08-17 NOTE — Telephone Encounter (Signed)
Called pt she state she has had SOB since last June. "It happens every other week. When I walk to my car after work I'm so out of breath and I have to sit there and let me heart slow down. It's be going on for a few months." Pt states "I'm just getting to the point where I'm tired. I have never been this bad." Pt denies weight changes.

## 2022-08-17 NOTE — Telephone Encounter (Signed)
Pt returning nurses call from 2/15 regarding SOB. Please advise.

## 2022-08-19 NOTE — Telephone Encounter (Signed)
See another phone note- sent over to MD for review.  Thanks!

## 2022-08-20 NOTE — Telephone Encounter (Signed)
Rachel Munroe, MD  You; Deberah Pelton, NP12 hours ago (10:00 PM)    Let's consider getting a cardiac MRI. Her EF is low last time we checked with grade II ddx. Someone has raised the question of cardiac amyloid. MRI may answer several questions. Please keep her appt on 3/1 with Denyse Amass to ensure physical exam and dosing of diuretic for HF symptoms if indicated. GA   Attempted to call patient, left message for patient to call back to office.

## 2022-08-25 NOTE — Progress Notes (Signed)
Cardiology Clinic Note   Patient Name: Rachel [name] Date of Encounter: 08/28/2022  Primary Care Provider:  Redmond School, MD Primary Cardiologist:  Elouise Munroe, MD  Patient Profile    Rachel [name] 62 year old female presents the clinic today for follow-up evaluation of her chronic diastolic CHF and hypertension.  Past Medical History    Past Medical History:  Diagnosis Date   Anemia    Anxiety    Arthritis    in both knees   Depression    GERD (gastroesophageal reflux disease)    Hyperlipidemia    Hypertension    Sleep apnea    Past Surgical History:  Procedure Laterality Date   CARPAL TUNNEL RELEASE Right    CATARACT EXTRACTION W/PHACO Right 02/19/2020   Procedure: CATARACT EXTRACTION PHACO AND INTRAOCULAR LENS PLACEMENT ([place]);  Surgeon: Baruch Goldmann, MD;  Location: [facility] ORS;  Service: Ophthalmology;  Laterality: Right;  CDE: 4.83   CESAREAN SECTION     cesarian  1990   COLONOSCOPY N/A 08/10/2016   Procedure: COLONOSCOPY;  Surgeon: Danie Binder, MD;  Location: [facility] END[facility] SUITE;  Service: Endoscopy;  Laterality: N/A;  10:30 AM   fallopian tube removed N/A    LEFT HEART CATH AND CORONARY ANGIOGRAPHY N/A 02/19/2022   Procedure: LEFT HEART CATH AND CORONARY ANGIOGRAPHY;  Surgeon: Troy Sine, MD;  Location: [place] [facility] LAB;  Service: Cardiovascular;  Laterality: N/A;   TOTAL KNEE ARTHROPLASTY Left 03/18/2021   Procedure: TOTAL KNEE ARTHROPLASTY;  Surgeon: Carole Civil, MD;  Location: [facility] ORS;  Service: Orthopedics;  Laterality: Left;    Allergies  Allergies  Allergen Reactions   Hydrocodone Nausea And Vomiting   Oxycodone Nausea And Vomiting    Codeine, hydrocodone, most pain pills.   Penicillins Itching, Nausea And Vomiting and Swelling    Has patient had a PCN reaction causing immediate rash, facial/tongue/throat swelling, SOB or lightheadedness with hypotension: Yes, around the eyes Has patient had a PCN reaction causing severe rash  involving mucus membranes or skin necrosis: No Has patient had a PCN reaction that required hospitalization: No Has patient had a PCN reaction occurring within the last 10 years: No  If all of the above answers are "NO", then may proceed with Cephalosporin use.    Precedex [Dexmedetomidine Hcl In Nacl] Other (See Comments)    Increased agitation/confusion in ICU [April 2018]   Seroquel [Quetiapine] Other (See Comments)    More agitated/confused per daughter [name] when given in the past.   Shellfish Allergy Itching    History of Present Illness    Rachel ZIELSDORF has a PMH of HTN, chronic diastolic CHF, flu B, cards, COPD, pneumonia, hypothyroidism, AKI, hyperlipidemia, morbid obesity, hyponatremia, elevated troponin, sepsis, prolonged QT interval, and abnormal cardiac CT angio.  She was seen by Dr. Margaretann Loveless during hospital consult 02/17/2022.  Her echocardiogram showed an EF of 45% with global hypokinesis and G2 DD.  EKG showed new left bundle branch block.  She has been admitted with pneumonia and sepsis.  She received IV antibiotics.  She was started on losartan metoprolol and spironolactone.  Coronary CTA 04/20/2022 showed a coronary calcium score of 850 with 99 percentile for age and sex matched control.  She was noted to have aortic atherosclerosis and elevated right hemidiaphragm.  Study was nondiagnostic alternative evaluation was recommended.  Cardiac catheterization 02/19/2022 showed mild-moderate multivessel coronary calcification with mild-moderate nonobstructive CAD.  She was noted to have proximal RCA 20%, mid RCA 20%,  acute marginal lesion 60%, second marginal lesion 30%, proximal LAD 5%.  She was seen in follow-up by Bunnie Domino, DNP on 04/10/2022.  During that time she remained stable from a cardiac standpoint.  She did note some weight gain since she had left the hospital.  She was trying to avoid sugar salt fried foods.  She denied CHF symptoms.  She reported that she  contracted bronchitis easily since being sick with pneumonia.  She reported several bouts of bronchitis since being hospitalized.  Of note she did report arthritis and is post carpal tunnel repair.  Myocardial amyloid imaging planar and SPECT was ordered but not completed.  She presents to the clinic today for follow-up evaluation and states she does not feel that her energy and breathing has returned since being in the hospital in August.  She went back to work right after being hospitalized and reports that she passed out 2 weeks ago.  She did not feel any palpitations prior to the event and has not had any events since that time.  She reports that she stopped smoking 6 years ago.  We reviewed her cardiac catheterization and echocardiogram.  She expressed understanding.  Her daughter expressed understanding as well.  She has not been doing any formal exercise.  Her weight has been stable.  She is euvolemic today.  She reports that at this time she is able to walk less than 200 feet before needing to stop to rest.  We reviewed obtaining cardiac MRI, referral to pulmonology, and repeat blood work.  She wishes to do all of these.  She also brings a request in for handicap sticker.  I will have her follow-up after testing.  Today she denies chest pain,  lower extremity edema, fatigue, palpitations, melena, hematuria, and hemoptysis.  Home Medications    Prior to Admission medications   Medication Sig Start Date End Date Taking? Authorizing Provider  acetaminophen (TYLENOL) 325 MG tablet Take 2 tablets (650 mg total) by mouth every 6 (six) hours as needed for headache or mild pain. 02/19/22   Elgergawy, Silver Huguenin, MD  albuterol (PROVENTIL) (2.5 MG/3ML) 0.083% nebulizer solution Take 2.5 mg by nebulization every 4 (four) hours as needed for wheezing or shortness of breath. Patient not taking: Reported on 04/10/2022 09/25/21   [provider]  aspirin EC 81 MG tablet Take by mouth. 10/26/16    [provider]  clonazePAM (KLONOPIN) 1 MG tablet Take 1 mg by mouth 3 (three) times daily as needed for anxiety.    [provider]  diclofenac Sodium (VOLTAREN) 1 % GEL Apply 2 g topically 3 (three) times daily as needed (pain).    [provider]  fluticasone (FLONASE) 50 MCG/ACT nasal spray Place 1 spray into both nostrils daily. 02/12/22   [provider]  ipratropium (ATROVENT) 0.02 % nebulizer solution Take 0.25 mg by nebulization every 6 (six) hours as needed for wheezing or shortness of breath. 12/21/21   [provider]  Ketotifen Fumarate (ALLERGY EYE DROPS OP) Place 1 drop into both eyes daily as needed (allergies).    [provider]  levocetirizine (XYZAL) 5 MG tablet Take 5 mg by mouth daily. 03/20/22   [provider]  losartan (COZAAR) 25 MG tablet Take 1 tablet (25 mg total) by mouth daily. 02/19/22   Elgergawy, Silver Huguenin, MD  Melatonin 10 MG TABS Take 20 mg by mouth at bedtime as needed (sleep).    [provider]  methimazole (TAPAZOLE) 5 MG  tablet Take 1 tablet (5 mg total) by mouth 2 (two) times daily. 02/19/22   Elgergawy, Silver Huguenin, MD  metoprolol succinate (TOPROL-XL) 100 MG 24 hr tablet Take 1 tablet (100 mg total) by mouth daily. Take with or immediately following a meal. 02/19/22   Elgergawy, Silver Huguenin, MD  phentermine (ADIPEX-P) 37.5 MG tablet Take 37.5 mg by mouth daily. 03/29/22   [provider]  rosuvastatin (CRESTOR) 10 MG tablet Take 1 tablet (10 mg total) by mouth daily. 02/19/22   Elgergawy, Silver Huguenin, MD  spironolactone (ALDACTONE) 25 MG tablet Take 1 tablet (25 mg total) by mouth daily. 02/19/22   Elgergawy, Silver Huguenin, MD    Family History    Family History  Problem Relation Age of Onset   Cancer Mother        lung   Heart attack Father    Stroke Paternal Grandmother    Kidney failure Maternal Grandmother    Cancer Maternal Grandfather        lung   Seizures Brother    Sarcoidosis  Sister    She indicated that her mother is deceased. She indicated that her father is deceased. She indicated that only one of her two sisters is alive. She indicated that her brother is deceased. She indicated that her maternal grandmother is deceased. She indicated that her maternal grandfather is deceased. She indicated that her paternal grandmother is deceased. She indicated that her paternal grandfather is deceased.  Social History    Social History   Socioeconomic History   Marital status: Single    Spouse name: Not on file   Number of children: Not on file   Years of education: Not on file   Highest education level: Not on file  Occupational History   Occupation: CNA    Comment: [employer]  Tobacco Use   Smoking status: Former    Packs/day: 1.00    Years: 30.00    Total pack years: 30.00    Types: Cigarettes    Quit date: 09/27/2016    Years since quitting: 5.9   Smokeless tobacco: Never  Vaping Use   Vaping Use: Never used  Substance and Sexual Activity   Alcohol use: Yes    Comment: rarely.last used x 3 days   Drug use: Not Currently    Comment: hx of marijuana use   Sexual activity: Not on file  Other Topics Concern   Not on file  Social History Narrative   Right handed. Single.  3 kids.  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  Caffeine 3 16 oz coffee, 4 sodas   Social Determinants of Health   Financial Resource Strain: Low Risk  (06/07/2018)   Overall Financial Resource Strain (CARDIA)    Difficulty of Paying Living Expenses: Not hard at all  Food Insecurity: No Food Insecurity (06/07/2018)   Hunger Vital Sign    Worried About Running Out of Food in the Last Year: Never true    Ran Out of Food in the Last Year: Never true  Transportation Needs: No Transportation Needs (06/07/2018)   PRAPARE - Hydrologist (Medical): No    Lack of Transportation (Non-Medical): No  Physical Activity: Insufficiently Active (06/07/2018)   Exercise  Vital Sign    Days of Exercise per Week: 1 day    Minutes of Exercise per Session: 20 min  Stress: Stress Concern Present (06/07/2018)   [place]  Feeling of Stress : To some extent  Social Connections: Somewhat Isolated (06/07/2018)   Social Connection and Isolation Panel [NHANES]    Frequency of Communication with Friends and Family: Three times a week    Frequency of Social Gatherings with Friends and Family: Three times a week    Attends Religious Services: 1 to 4 times per year    Active Member of Clubs or Organizations: No    Attends Archivist Meetings: Never    Marital Status: Never married  Intimate Partner Violence: Not At Risk (06/07/2018)   Humiliation, Afraid, Rape, and Kick questionnaire    Fear of Current or Ex-Partner: No    Emotionally Abused: No    Physically Abused: No    Sexually Abused: No     Review of Systems    General:  No chills, fever, night sweats or weight changes.  Cardiovascular:  No chest pain, dyspnea on exertion, edema, orthopnea, palpitations, paroxysmal nocturnal dyspnea. Dermatological: No rash, lesions/masses Respiratory: No cough, dyspnea Urologic: No hematuria, dysuria Abdominal:   No nausea, vomiting, diarrhea, bright red blood per rectum, melena, or hematemesis Neurologic:  No visual changes, wkns, changes in mental status. All other systems reviewed and are otherwise negative except as noted above.  Physical Exam    VS:  BP 138/74   Pulse 73   Ht 5' (1.524 m)   Wt 197 lb 12.8 oz (89.7 kg)   SpO2 93%   BMI 38.63 kg/m  , BMI Body mass index is 38.63 kg/m. GEN: Well nourished, well developed, in no acute distress. HEENT: normal. Neck: Supple, no JVD, carotid bruits, or masses. Cardiac: RRR, no murmurs, rubs, or gallops. No clubbing, cyanosis, edema.  Radials/DP/PT 2+ and equal bilaterally.  Respiratory:  Respirations regular and unlabored, diminished  lower lobes bilaterally. GI: Soft, nontender, nondistended, BS + x 4. MS: no deformity or atrophy. Skin: warm and dry, no rash. Neuro:  Strength and sensation are intact. Psych: Normal affect.  Accessory Clinical Findings    Recent Labs: 02/14/2022: TSH 0.105 02/19/2022: ALT 18; B Natriuretic Peptide 323.6; BUN 11; Creatinine, Ser 0.52; Hemoglobin 9.8; Magnesium 1.9; Platelets 523; Potassium 3.9; Sodium 139   Recent Lipid Panel    Component Value Date/Time   TRIG 179 (H) 10/21/2016 2039         ECG personally reviewed by me today-sinus rhythm with bundle branch block 73 bpm- No acute changes  Echocardiogram 02/15/2022 IMPRESSIONS     1. Left ventricular ejection fraction, by estimation, is 40 to 45%. The  left ventricle has mildly decreased function. The left ventricle  demonstrates global hypokinesis. There is mild concentric left ventricular  hypertrophy. Left ventricular diastolic  parameters are consistent with Grade II diastolic dysfunction  (pseudonormalization). Elevated left atrial pressure.   2. Right ventricular systolic function is normal. The right ventricular  size is normal. Tricuspid regurgitation signal is inadequate for assessing  PA pressure.   3. Left atrial size was mildly dilated.   4. The mitral valve is normal in structure. Mild to moderate mitral valve  regurgitation.   5. The aortic valve is tricuspid. Aortic valve regurgitation is not  visualized.   Comparison(s): A prior study was performed on 10/12/2016. Prior images  unable to be directly viewed, comparison made by report only. The left  ventricular function is worsened. LBBB is also new since 2018.   FINDINGS   Left Ventricle: Left ventricular ejection fraction, by estimation, is 40  to 45%. The left ventricle has  mildly decreased function. The left  ventricle demonstrates global hypokinesis. The left ventricular internal  cavity size was normal in size. There is   mild concentric left  ventricular hypertrophy. Abnormal (paradoxical)  septal motion, consistent with left bundle branch block. Left ventricular  diastolic parameters are consistent with Grade II diastolic dysfunction  (pseudonormalization). Elevated left  atrial pressure.   Right Ventricle: The right ventricular size is normal. No increase in  right ventricular wall thickness. Right ventricular systolic function is  normal. Tricuspid regurgitation signal is inadequate for assessing PA  pressure.   Left Atrium: Left atrial size was mildly dilated.   Right Atrium: Right atrial size was normal in size.   Pericardium: There is no evidence of pericardial effusion.   Mitral Valve: The mitral valve is normal in structure. Mild mitral annular  calcification. Mild to moderate mitral valve regurgitation, with  centrally-directed jet. MV peak gradient, 9.1 mmHg. The mean mitral valve  gradient is 3.0 mmHg.   Tricuspid Valve: The tricuspid valve is normal in structure. Tricuspid  valve regurgitation is not demonstrated.   Aortic Valve: The aortic valve is tricuspid. Aortic valve regurgitation is  not visualized. Aortic valve mean gradient measures 5.0 mmHg. Aortic valve  peak gradient measures 9.1 mmHg. Aortic valve area, by VTI measures 2.19  cm.   Pulmonic Valve: The pulmonic valve was not well visualized. Pulmonic valve  regurgitation is not visualized.   Aorta: The aortic root is normal in size and structure.   IAS/Shunts: No atrial level shunt detected by color flow Doppler.     Cardiac catheterization 02/19/2022    Prox RCA-1 lesion is 20% stenosed.   Prox RCA-2 lesion is 20% stenosed.   Mid RCA to Dist RCA lesion is 20% stenosed.   Acute Mrg lesion is 60% stenosed.   2nd Mrg lesion is 30% stenosed.   Prox LAD lesion is 5% stenosed.   Mild to moderate multivessel coronary calcification with mild-moderate nonobstructive CAD.   LV EDP 29 mmHg   RECOMMENDATION: Guideline directed medical  therapy for HFrEF.  Aggressive lipid-lowering therapy with target LDL less than 70.  Diagnostic Dominance: Right  Intervention     Assessment & Plan   1.  HFrEF, SOB, persistent cough-continues with dyspnea.  Euvolemic.  NYHA class 3.  Echocardiogram 02/15/2022 showed an LVEF of 40-45%, G2 DD, mildly dilated left atria, mild-moderate mitral valve regurgitation. Continue metoprolol, losartan, spironolactone Heart healthy low-sodium diet-salty 6 given Increase physical activity as tolerated Order cardiac MRI, chest x-ray, CBC, BMP Referral to pulmonology for PFTs  Essential hypertension-BP today 138/74 Continue losartan, metoprolol Heart healthy low-sodium diet-salty 6 given Increase physical activity as tolerated  Coronary artery disease-no chest pain today.  Underwent cardiac catheterization 02/19/2022 and was noted to have nonobstructive CAD Continue metoprolol, aspirin, rosuvastatin Heart healthy low-sodium diet-salty 6 given Increase physical activity as tolerated  Hyperlipidemia-LDL 152 on 05/29/2020 Heart healthy low-sodium high-fiber diet Continue rosuvastatin, aspirin Follows with PCP  Disposition: Follow-up with Dr. Margaretann Loveless or Bunnie Domino, DNP in 6 months.   Jossie Ng. Katrinia Straker NP-C     08/28/2022, 2:19 PM [place] [address] Office [phone] Fax [phone]    I spent 14 minutes examining this patient, reviewing medications, and using patient centered shared decision making involving her cardiac care.  Prior to her visit I spent greater than 20 minutes reviewing her past medical history,  medications, and prior cardiac tests.

## 2022-08-27 ENCOUNTER — Encounter: Payer: Self-pay | Admitting: Radiology

## 2022-08-28 ENCOUNTER — Encounter: Payer: Self-pay | Admitting: General Practice

## 2022-08-28 ENCOUNTER — Ambulatory Visit
Admission: RE | Admit: 2022-08-28 | Discharge: 2022-08-28 | Disposition: A | Discharge status: Home / Self Care | Payer: BC Managed Care – PPO | Source: Ambulatory Visit | Attending: General Practice | Admitting: General Practice

## 2022-08-28 ENCOUNTER — Ambulatory Visit: Payer: BC Managed Care – PPO | Attending: General Practice | Admitting: General Practice

## 2022-08-28 VITALS — BP 138/74 | HR 73 | Ht 60.0 in | Wt 197.8 lb

## 2022-08-28 DIAGNOSIS — I2510 Atherosclerotic heart disease of native coronary artery without angina pectoris: Secondary | ICD-10-CM

## 2022-08-28 DIAGNOSIS — R053 Chronic cough: Secondary | ICD-10-CM | POA: Diagnosis not present

## 2022-08-28 DIAGNOSIS — I5020 Unspecified systolic (congestive) heart failure: Secondary | ICD-10-CM

## 2022-08-28 DIAGNOSIS — I10 Essential (primary) hypertension: Secondary | ICD-10-CM

## 2022-08-28 DIAGNOSIS — E782 Mixed hyperlipidemia: Secondary | ICD-10-CM | POA: Diagnosis not present

## 2022-08-28 DIAGNOSIS — R059 Cough, unspecified: Secondary | ICD-10-CM

## 2022-08-28 DIAGNOSIS — R0602 Shortness of breath: Secondary | ICD-10-CM

## 2022-08-28 NOTE — Patient Instructions (Signed)
Medication Instructions:  The current medical regimen is effective;  continue present plan and medications as directed. Please refer to the Current Medication list given to you today.  *If you need a refill on your cardiac medications before your next appointment, please call your pharmacy*  Lab Work: [place] If you have labs (blood work) drawn today and your tests are completely normal, you will receive your results only by: [place] (if you have MyChart) OR  A paper copy in the mail If you have any lab test that is abnormal or we need to change your treatment, we will call you to review the results.  Testing/Procedures: A chest x-ray takes a picture of the organs and structures inside the chest, including the heart, lungs, and blood vessels. This test can show several things, including, whether the heart is enlarges; whether fluid is building up in the lungs; and whether pacemaker / defibrillator leads are still in place.    Your physician has requested that you have a cardiac MRI. Cardiac MRI uses a computer to create images of your heart as its beating, producing both still and moving pictures of your heart and major blood vessels. For further information please visit [email or web address] Please follow the instruction sheet given to you today for more information.  Other Instructions WE HAVE REFERRED YOU TO SEE A PULMONOLOGIST-SOMEONE WILL BE CALLING TO SCHEDULE AN APPOINTMENT   Follow-Up: At [facility], you and your health needs are our priority.  As part of our continuing mission to provide you with exceptional heart care, we have created designated Provider Care Teams.  These Care Teams include your primary Cardiologist (physician) and Advanced Practice Providers (APPs -  Physician Assistants and Nurse Practitioners) who all work together to provide you with the care you need, when you need it.  Your next appointment:   1-2 month(s) (THIS WILL BE AFTER YOUR  ABOVE TESTING)  Provider:   Elouise Munroe, MD

## 2022-08-28 NOTE — Telephone Encounter (Signed)
Patient seen today 3/1 by Coletta Memos, NP

## 2022-08-29 LAB — CBC
Hematocrit: 38.2 % (ref 34.0–46.6)
Hemoglobin: 12.4 g/dL (ref 11.1–15.9)
MCH: 28.2 pg (ref 26.6–33.0)
MCHC: 32.5 g/dL (ref 31.5–35.7)
MCV: 87 fL (ref 79–97)
Platelets: 418 10*3/uL (ref 150–450)
RBC: 4.39 x10E6/uL (ref 3.77–5.28)
RDW: 13.8 % (ref 11.7–15.4)
WBC: 6.7 10*3/uL (ref 3.4–10.8)

## 2022-08-29 LAB — BASIC METABOLIC PANEL
BUN/Creatinine Ratio: 26 (ref 12–28)
BUN: 17 mg/dL (ref 8–27)
CO2: 21 mmol/L (ref 20–29)
Calcium: 9.7 mg/dL (ref 8.7–10.3)
Chloride: 106 mmol/L (ref 96–106)
Creatinine, Ser: 0.66 mg/dL (ref 0.57–1.00)
Glucose: 107 mg/dL — ABNORMAL HIGH (ref 70–99)
Potassium: 4.4 mmol/L (ref 3.5–5.2)
Sodium: 144 mmol/L (ref 134–144)
eGFR: 100 mL/min/{1.73_m2} (ref >59)

## 2022-09-06 DIAGNOSIS — J9611 Chronic respiratory failure with hypoxia: Secondary | ICD-10-CM | POA: Diagnosis not present

## 2022-09-06 DIAGNOSIS — I340 Nonrheumatic mitral (valve) insufficiency: Secondary | ICD-10-CM | POA: Diagnosis not present

## 2022-09-06 DIAGNOSIS — J449 Chronic obstructive pulmonary disease, unspecified: Secondary | ICD-10-CM | POA: Diagnosis not present

## 2022-09-06 DIAGNOSIS — I4891 Unspecified atrial fibrillation: Secondary | ICD-10-CM | POA: Diagnosis not present

## 2022-09-06 DIAGNOSIS — Z20822 Contact with and (suspected) exposure to covid-19: Secondary | ICD-10-CM | POA: Diagnosis not present

## 2022-09-06 DIAGNOSIS — I2510 Atherosclerotic heart disease of native coronary artery without angina pectoris: Secondary | ICD-10-CM | POA: Diagnosis not present

## 2022-09-06 DIAGNOSIS — I110 Hypertensive heart disease with heart failure: Secondary | ICD-10-CM | POA: Diagnosis not present

## 2022-09-06 DIAGNOSIS — I5043 Acute on chronic combined systolic (congestive) and diastolic (congestive) heart failure: Secondary | ICD-10-CM | POA: Diagnosis not present

## 2022-09-06 DIAGNOSIS — I2489 Other forms of acute ischemic heart disease: Secondary | ICD-10-CM | POA: Diagnosis not present

## 2022-09-06 DIAGNOSIS — I5189 Other ill-defined heart diseases: Secondary | ICD-10-CM | POA: Diagnosis not present

## 2022-09-06 DIAGNOSIS — I509 Heart failure, unspecified: Secondary | ICD-10-CM | POA: Diagnosis not present

## 2022-09-06 DIAGNOSIS — Z7982 Long term (current) use of aspirin: Secondary | ICD-10-CM | POA: Diagnosis not present

## 2022-09-06 DIAGNOSIS — I447 Left bundle-branch block, unspecified: Secondary | ICD-10-CM | POA: Diagnosis not present

## 2022-09-06 DIAGNOSIS — R002 Palpitations: Secondary | ICD-10-CM | POA: Diagnosis not present

## 2022-09-06 DIAGNOSIS — R0789 Other chest pain: Secondary | ICD-10-CM | POA: Diagnosis not present

## 2022-09-06 DIAGNOSIS — Z1152 Encounter for screening for COVID-19: Secondary | ICD-10-CM | POA: Diagnosis not present

## 2022-09-06 DIAGNOSIS — Z885 Allergy status to narcotic agent status: Secondary | ICD-10-CM | POA: Diagnosis not present

## 2022-09-06 DIAGNOSIS — E6609 Other obesity due to excess calories: Secondary | ICD-10-CM | POA: Diagnosis not present

## 2022-09-06 DIAGNOSIS — R0602 Shortness of breath: Secondary | ICD-10-CM | POA: Diagnosis not present

## 2022-09-06 DIAGNOSIS — R0989 Other specified symptoms and signs involving the circulatory and respiratory systems: Secondary | ICD-10-CM | POA: Diagnosis not present

## 2022-09-06 DIAGNOSIS — R7303 Prediabetes: Secondary | ICD-10-CM | POA: Diagnosis not present

## 2022-09-06 DIAGNOSIS — R0902 Hypoxemia: Secondary | ICD-10-CM | POA: Diagnosis not present

## 2022-09-06 DIAGNOSIS — I517 Cardiomegaly: Secondary | ICD-10-CM | POA: Diagnosis not present

## 2022-09-06 DIAGNOSIS — G4733 Obstructive sleep apnea (adult) (pediatric): Secondary | ICD-10-CM | POA: Diagnosis not present

## 2022-09-15 DIAGNOSIS — E118 Type 2 diabetes mellitus with unspecified complications: Secondary | ICD-10-CM | POA: Diagnosis not present

## 2022-09-15 DIAGNOSIS — I10 Essential (primary) hypertension: Secondary | ICD-10-CM | POA: Diagnosis not present

## 2022-09-15 DIAGNOSIS — E6609 Other obesity due to excess calories: Secondary | ICD-10-CM | POA: Diagnosis not present

## 2022-09-15 DIAGNOSIS — Z6839 Body mass index (BMI) 39.0-39.9, adult: Secondary | ICD-10-CM | POA: Diagnosis not present

## 2022-09-15 DIAGNOSIS — J449 Chronic obstructive pulmonary disease, unspecified: Secondary | ICD-10-CM | POA: Diagnosis not present

## 2022-09-15 DIAGNOSIS — F329 Major depressive disorder, single episode, unspecified: Secondary | ICD-10-CM | POA: Diagnosis not present

## 2022-09-17 ENCOUNTER — Institutional Professional Consult (permissible substitution): Payer: BC Managed Care – PPO | Admitting: Internal Medicine

## 2022-09-17 NOTE — Progress Notes (Deleted)
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     ASSESSMENT / PLAN:    Acute respiratory failure with hypoxemia from influenza and super bacterial infection with ARDS (resolved now) Presumed COPD with wheezing improving with bronchodilators and steroid  Tobacco abuse Continue bronchodilators and has completed steroid taper 10/25/16 Continue incentive spirometry Consult PT to mobilize, they are recommending 24 hr supervision or SNF, pt declining SNF at this time, she will go home with daughter with Home health services.  Strongly advised patient to avoid smoking and using tobacco and second hand smoke.  Discharging home on home oxygen 3L/min, home nebulizer and bronchodilator therapy, close follow up with PCP.  Recommend outpatient pulmonology referral.    CARDIOVASCULAR A:  Demand ischemia Septic shock with Hypotension requiring pressor - off pressor [id number] Grd 2 diastolic dysfunction Hypertension Resume home Zestoretic at discharge   AKI, likely from ATN: Resolved Hypokalemia: K 3.2. Suspect from vomiting and insensible losses. Repleted   GASTROINTESTINAL GERD Treated with famotidine for GERD and GI protection   HEMATOLOGIC Normocytic anemia: Hb 9-10 though now trending 8 over last two days. No signs of overt blood loss other than bloody sputum described above. Leukocytosis: Stable at 15-16, on steroids, but discontinue them 4/29.       INFECTIOUS A:   Influenza B (by report, can't see lab result from outside hospital) with possible bacterial superinfection: s/p  antimicrobial therapy. PCT reassuring. Completed treatment         08/12/2017 1st [facility] office visit/ [name]   Re sob/ cough with no obst on spirometry on ACEi  Chief Complaint  Patient presents with   Pulmonary Consult    Self referral. Pt states that her breathing has been progressively worse since April 2018. She states she quit smoking and gained approx 50 lbs. She states that she gets out of breath just walking from room to room at home. She states she tends to catch colds easily. She started coughing just last night with clear sputum. She has o2 at home that she was advised to use with sleep but she admits to not using it.    after left the hospital p above atdmit never regained baselline activity tolerance with doe   gradually worse  assoc with wt gain  with severe episodes of cough she assoc with recurrent uri's "they say I have bronchitis and copd"  most recently in Dec 2018 rx abx prednisone and maint spiriva/ symbicort  But no better = MMRC3 = can't walk 100 yards even at a slow pace at a flat grade s stopping due to sob  / cough is dry/ harsh/hacking  worse day > noct  rec Stop lisinopril and start losartan 100/25  one daily in its place and your breathing should gradually improve over several weeks Plan A = Automatic = symbicort 80 Take 2 puffs first thing in am and then another 2 puffs about 12 hours later and stop spiriva Work on inhaler technique: Plan B = Backup Only use your albuterol  as a rescue medication Plan C = Crisis - only use your albuterol nebulizer if you first try Plan B and it fails to help > ok to use the nebulizer up to every 4 hours but if start needing it regularly call for immediate appointment Prednisone 10 mg take  4 each am x 2 days,   2 each am x 2 days,  1 each am x 2 days and stop   Aug 25 2017 seen for AB>> [facility]  pred/ abx   10/11/2017  f/u ov/[name] re:  Uacs AB/ off acei since 08/12/17  Chief Complaint  Patient presents with    Follow-up    Breathing has improved. She is not coughing any less. She is using her albuterol inhaler 1 x per wk on average and has not needed neb.   Dyspnea:  Much better and feels doe due to out of shape/ walks slow pacer and does steps ok now = one flight  Only   Cough: with temp changes / assoc nasal congestion  Sleep: with cpap  SABA use:  Neb with colds only  Rec Pantoprazole (protonix) 40 mg   Take  30-60 min before first meal of the day and Pepcid (famotidine)  20 mg one @  bedtime until return to office  GERD diet reviewed, bed blocks rec  Please remember to go to the lab and x-ray department downstairs in the basement  for your tests - we will call you with the results when they are available. Please schedule a follow up office visit in 6 weeks, call sooner if needed  - bring all inhalers but don't use them that day -  PFT's on return    09/17/2022  f/u ov/[name] re: ***   maint on ***  No chief complaint on file.   Dyspnea:  *** Cough: *** Sleeping: *** SABA use: *** 02: *** Covid status:   *** Lung cancer screening :  ***    No obvious day to day or daytime variability or assoc excess/ purulent sputum or mucus plugs or hemoptysis or cp or chest tightness, subjective wheeze or overt sinus or hb symptoms.   *** without nocturnal  or early am exacerbation  of respiratory  c/o's or need for noct saba. Also denies any obvious fluctuation of symptoms with weather or environmental changes or other aggravating or alleviating factors except as outlined above   No unusual exposure hx or h/o childhood pna/ asthma or knowledge of premature birth.  Current Allergies, Complete Past Medical History, Past Surgical History, Family History, and Social History were reviewed in [employer] record.  ROS  The following are not active complaints unless bolded Hoarseness, sore throat, dysphagia, dental problems, itching, sneezing,  nasal congestion or discharge of excess  mucus or purulent secretions, ear ache,   fever, chills, sweats, unintended wt loss or wt gain, classically pleuritic or exertional cp,  orthopnea pnd or arm/hand swelling  or leg swelling, presyncope, palpitations, abdominal pain, anorexia, nausea, vomiting, diarrhea  or change in bowel habits or change in bladder habits, change in stools or change in urine, dysuria, hematuria,  rash, arthralgias, visual complaints, headache, numbness, weakness or ataxia or problems with walking or coordination,  change in mood or  memory.        No outpatient medications have been marked as taking for the 09/17/22 encounter (Appointment) with Tanda Rockers, MD.   Current Facility-Administered Medications for the 09/17/22 encounter (Appointment) with Tanda Rockers, MD  Medication   bupivacaine-meloxicam ER (ZYNRELEF) injection 400 mg                       Objective:   Physical Exam  wts   09/17/2022       ***  10/11/2017       239   08/12/17 231 lb (104.8 kg)  10/25/16 190 lb (86.2 kg)  08/10/16 203 lb (92.1 kg)     Vital signs reviewed  09/17/2022  - Note at rest 02 sats  ***% on ***   General appearance:    ***               Assessment:

## 2022-09-18 ENCOUNTER — Telehealth (HOSPITAL_COMMUNITY): Payer: Self-pay | Admitting: Emergency Medicine

## 2022-09-18 NOTE — Telephone Encounter (Signed)
Reaching out to patient to offer assistance regarding upcoming cardiac imaging study; pt verbalizes understanding of appt date/time, parking situation and where to check in, pre-test NPO status and medications ordered, and verified current allergies; name and call back number provided for further questions should they arise Marchia Bond RN Navigator Cardiac Imaging [facility] [phone] office [phone] cell  Arrival [address] entrance  Denies iv issues Metal to knee replacement Holding spironolactone Some claustro- okay without meds

## 2022-09-21 ENCOUNTER — Encounter (HOSPITAL_COMMUNITY): Payer: Self-pay

## 2022-09-21 ENCOUNTER — Other Ambulatory Visit: Payer: Self-pay | Admitting: General Practice

## 2022-09-21 ENCOUNTER — Ambulatory Visit (HOSPITAL_COMMUNITY)
Admission: RE | Admit: 2022-09-21 | Discharge: 2022-09-21 | Disposition: A | Discharge status: Home / Self Care | Payer: BC Managed Care – PPO | Source: Ambulatory Visit | Attending: General Practice | Admitting: General Practice

## 2022-09-21 DIAGNOSIS — I5020 Unspecified systolic (congestive) heart failure: Secondary | ICD-10-CM

## 2022-11-03 NOTE — Progress Notes (Signed)
Cardiology Office Note:    Date:  11/04/22  ID:  Rachel [name], DOB Nov 18, 1960, MRN [id number]  PCP:  Elfredia Nevins, MD  Cardiologist:  Parke Poisson, MD  Electrophysiologist:  None   Referring MD: Elfredia Nevins, MD   Chief Complaint/Reason for Referral: Follow up  History of Present Illness:    Rachel [name] is a 62 y.o. female with a history of chronic diastolic CHF, hypertension, COPD, hypothyroidism, hyperlipidemia, and obesity.   She was initially seen on 822/2023 during a hospital consult.  Her echocardiogram showed an EF of 45% with global hypokinesis and G2 DD.  EKG showed new left bundle branch block.  She has been admitted with pneumonia and sepsis.  She received IV antibiotics.  She was started on losartan metoprolol and spironolactone.  Coronary CTA 04/20/2022 showed a coronary calcium score of 850 with 99 percentile for age and sex matched control.  She was noted to have aortic atherosclerosis and elevated right hemidiaphragm.  Study was nondiagnostic alternative evaluation was recommended.  Cardiac catheterization 02/19/2022 showed mild-moderate multivessel coronary calcification with mild-moderate nonobstructive CAD.  She was noted to have proximal RCA 20%, mid RCA 20%, acute marginal lesion 60%, second marginal lesion 30%, proximal LAD 5%.  She was seen in follow-up by Bailey Mech, DNP on 04/10/2022.  During that time she remained stable from a cardiac standpoint.  She did note some weight gain since she had left the hospital.  She was trying to avoid sugar salt fried foods.  She denied CHF symptoms.  She reported that she contracted bronchitis easily since being sick with pneumonia.  She reported several bouts of bronchitis since being hospitalized.  Of note she did report arthritis and is post carpal tunnel repair.  Myocardial amyloid imaging planar and SPECT was ordered but not completed.  She was last seen by Edd Fabian, NP in clinic on  08/28/2022 for follow-up evaluation and stated she did not feel that her energy and breathing had returned since being in the hospital in August. She went back to work right after being hospitalized and reported that she passed out 2 weeks prior this visit. She did not feel any palpitations prior to the event and had not had any events since that time. She reported that she stopped smoking 6 years ago.    Today, she complains of shortness of breath and was hospitalized at [facility] a couple of weeks ago. She reports she had fluid build up. She was seen and was prescribed 20 mg lasix. She was compliant with her 30 day supply of lasix. She was also prescribed 80 mg atorvastatin. She is currently compliant with 81 mg ASA, 100 mg metoprolol, 25 mg spironolactone, 25 mg losartan.  She requires a refill on atorvastatin and lasix. She is a former smoker and quit 6 years ago.  She was unable to get her MRI due to having to leave early to go to work. She plans on rescheduling soon at a time that would fit in her schedule.   She also complains of knee swelling, which she believes is attributed to her arthritis. She denies any peripheral edema. She also has a hx of knee replacement.   She denies any palpitations, chest pain, or peripheral edema. No lightheadedness, headaches, syncope, orthopnea, or PND.  Past Medical History:  Diagnosis Date   Anemia    Anxiety    Arthritis    in both knees   Depression    GERD (gastroesophageal reflux disease)  Hyperlipidemia    Hypertension    Sleep apnea     Past Surgical History:  Procedure Laterality Date   CARPAL TUNNEL RELEASE Right    CATARACT EXTRACTION W/PHACO Right 02/19/2020   Procedure: CATARACT EXTRACTION PHACO AND INTRAOCULAR LENS PLACEMENT (IOC);  Surgeon: Fabio Pierce, MD;  Location: [facility] ORS;  Service: Ophthalmology;  Laterality: Right;  CDE: 4.83   CESAREAN SECTION     cesarian  1990   COLONOSCOPY N/A 08/10/2016   Procedure: COLONOSCOPY;  Surgeon:  West Bali, MD;  Location: [facility] ENDO SUITE;  Service: Endoscopy;  Laterality: N/A;  10:30 AM   fallopian tube removed N/A    LEFT HEART CATH AND CORONARY ANGIOGRAPHY N/A 02/19/2022   Procedure: LEFT HEART CATH AND CORONARY ANGIOGRAPHY;  Surgeon: Lennette Bihari, MD;  Location: [facility] INVASIVE CV LAB;  Service: Cardiovascular;  Laterality: N/A;   TOTAL KNEE ARTHROPLASTY Left 03/18/2021   Procedure: TOTAL KNEE ARTHROPLASTY;  Surgeon: Vickki Hearing, MD;  Location: [facility] ORS;  Service: Orthopedics;  Laterality: Left;    Current Medications: Current Meds  Medication Sig   albuterol (PROVENTIL) (2.5 MG/3ML) 0.083% nebulizer solution Take 2.5 mg by nebulization every 4 (four) hours as needed for wheezing or shortness of breath.   aspirin EC 81 MG tablet Take by mouth.   atorvastatin (LIPITOR) 80 MG tablet Take 1 tablet (80 mg total) by mouth daily.   clonazePAM (KLONOPIN) 1 MG tablet Take 1 mg by mouth 3 (three) times daily as needed for anxiety.   diclofenac Sodium (VOLTAREN) 1 % GEL Apply 2 g topically 3 (three) times daily as needed (pain).   empagliflozin (JARDIANCE) 10 MG TABS tablet Take 1 tablet (10 mg total) by mouth daily before breakfast.   fluticasone (FLONASE) 50 MCG/ACT nasal spray Place 1 spray into both nostrils daily.   ipratropium (ATROVENT) 0.02 % nebulizer solution Take 0.25 mg by nebulization every 6 (six) hours as needed for wheezing or shortness of breath.   methimazole (TAPAZOLE) 5 MG tablet Take 1 tablet (5 mg total) by mouth 2 (two) times daily.   metoprolol succinate (TOPROL-XL) 100 MG 24 hr tablet Take 1 tablet (100 mg total) by mouth daily. Take with or immediately following a meal.   phentermine (ADIPEX-P) 37.5 MG tablet Take 37.5 mg by mouth daily.   sacubitril-valsartan (ENTRESTO) 24-26 MG Take 1 tablet by mouth 2 (two) times daily.   spironolactone (ALDACTONE) 25 MG tablet Take 1 tablet (25 mg total) by mouth daily.   [DISCONTINUED] furosemide (LASIX) 20 MG tablet Take  20 mg by mouth daily.   [DISCONTINUED] losartan (COZAAR) 25 MG tablet Take 1 tablet (25 mg total) by mouth daily.   Current Facility-Administered Medications for the 11/04/22 encounter (Office Visit) with Parke Poisson, MD  Medication   bupivacaine-meloxicam ER (ZYNRELEF) injection 400 mg     Allergies:   Hydrocodone, Oxycodone, Penicillins, Precedex [dexmedetomidine hcl in nacl], Seroquel [quetiapine], and Shellfish allergy   Social History   Tobacco Use   Smoking status: Former    Packs/day: 1.00    Years: 30.00    Additional pack years: 0.00    Total pack years: 30.00    Types: Cigarettes    Quit date: 09/27/2016    Years since quitting: 6.1   Smokeless tobacco: Never  Vaping Use   Vaping Use: Never used  Substance Use Topics   Alcohol use: Yes    Comment: rarely.last used x 3 days   Drug use: Not Currently  Comment: hx of marijuana use     Family History: The patient's family history includes Cancer in her maternal grandfather and mother; Heart attack in her father; Kidney failure in her maternal grandmother; Sarcoidosis in her sister; Seizures in her brother; Stroke in her paternal grandmother.  ROS:   Please see the history of present illness.   (+) shortness of breath (+) bilateral knee swelling All other systems reviewed and are negative.  EKGs/Labs/Other Studies Reviewed:    The following studies were reviewed today:  Cath 02/19/2022:    Prox RCA-1 lesion is 20% stenosed.   Prox RCA-2 lesion is 20% stenosed.   Mid RCA to Dist RCA lesion is 20% stenosed.   Acute Mrg lesion is 60% stenosed.   2nd Mrg lesion is 30% stenosed.   Prox LAD lesion is 5% stenosed.   Mild to moderate multivessel coronary calcification with mild-moderate nonobstructive CAD.  LV EDP 29 mmHg   Chest CTA 02/17/2022:  IMPRESSION: 1. Similar right lower lobe consolidation, likely due to infection or aspiration. 2. Debris seen in the right mainstem and right lower lobe  bronchi, new when compared to prior, compatible with interval aspiration.  Echo 02/15/2022: IMPRESSION: 1. Left ventricular ejection fraction, by estimation, is 40 to 45% . The left ventricle has mildly decreased function. The left ventricle demonstrates global hypokinesis. There is mild concentric left ventricular hypertrophy. Left ventricular diastolic parameters are consistent with Grade II diastolic dysfunction ( pseudonormalization) . Elevated left atrial pressure. 2. Right ventricular systolic function is normal. The right ventricular size is normal. Tricuspid regurgitation signal is inadequate for assessing PA pressure. 3. Left atrial size was mildly dilated. 4. The mitral valve is normal in structure. Mild to moderate mitral valve regurgitation. 5. The aortic valve is tricuspid. Aortic valve regurgitation is not visualized.   EKG:  EKG is personally reviewed.  11/04/2022: EKG was not ordered.  Imaging studies that I have independently reviewed today: n/a  Recent Labs: 02/14/2022: TSH 0.105 02/19/2022: ALT 18; B Natriuretic Peptide 323.6; Magnesium 1.9 08/28/2022: BUN 17; Creatinine, Ser 0.66; Hemoglobin 12.4; Platelets 418; Potassium 4.4; Sodium 144  Recent Lipid Panel    Component Value Date/Time   TRIG 179 (H) 10/21/2016 2039    Physical Exam:    VS:  BP (!) 150/76 (BP Location: Left Arm, Patient Position: Sitting, Cuff Size: Large)   Pulse 72   Ht 5' (1.524 m)   Wt 198 lb 3.2 oz (89.9 kg)   SpO2 94%   BMI 38.71 kg/m     Wt Readings from Last 5 Encounters:  11/04/22 198 lb 3.2 oz (89.9 kg)  08/28/22 197 lb 12.8 oz (89.7 kg)  04/10/22 207 lb 12.8 oz (94.3 kg)  03/08/22 200 lb (90.7 kg)  02/13/22 199 lb 15.3 oz (90.7 kg)    Constitutional: No acute distress Eyes: sclera non-icteric, normal conjunctiva and lids ENMT: normal dentition, moist mucous membranes Cardiovascular: regular rhythm, normal rate, no murmur. S1 and S2 normal. No jugular venous distention.  Respiratory:  clear to auscultation bilaterally GI : normal bowel sounds, soft and nontender. No distention.   MSK: extremities warm, well perfused. No edema.  NEURO: grossly nonfocal exam, moves all extremities. PSYCH: alert and oriented x 3, normal mood and affect.   ASSESSMENT:    1. Essential hypertension, benign   2. HFrEF (heart failure with reduced ejection fraction) (HCC)   3. Coronary artery disease involving native coronary artery of native heart without angina pectoris   4. Mixed hyperlipidemia  5. SOB (shortness of breath)    PLAN:   Essential hypertension, benign - Plan: Basic metabolic panel  HFrEF (heart failure with reduced ejection fraction) (HCC) - Plan: Basic metabolic panel  Coronary artery disease involving native coronary artery of native heart without angina pectoris  Mixed hyperlipidemia  SOB (shortness of breath)  Heart Failure Therapy ACE-I/ARB/ARNI: Transition losartan to Entresto 24-26 twice daily BB: Metoprolol succinate 100 mg daily MRA: Spironolactone 25 mg daily SGLT2I: Will start Jardiance 10 mg daily. Diuretic plan: Refill furosemide 20 mg daily. NYHA class I-II symptoms today.  CAD - 80 mg atorvastatin, ASA 81 mg daily  Dyspnea on exertion HFmrEF - Follow up for med titration in 2-3 weeks  - labs in 2 weeks - before follow up with [facility] - Next follow up in [place] in 2-3 months after MRI, patient prefers to follow-up in [place] due to proximity to her work.    Total time of encounter: 30 minutes total time of encounter, including 20 minutes spent in face-to-face patient care on the date of this encounter. This time includes coordination of care and counseling regarding above mentioned problem list. Remainder of non-face-to-face time involved reviewing chart documents/testing relevant to the patient encounter and documentation in the medical record. I have independently reviewed documentation from referring provider.   Weston Brass, MD,  [facility] [place]  [facility]   Shared Decision Making/Informed Consent:       Medication Adjustments/Labs and Tests Ordered: Current medicines are reviewed at length with the patient today.  Concerns regarding medicines are outlined above.   Orders Placed This Encounter  Procedures   Basic metabolic panel   Meds ordered this encounter  Medications   atorvastatin (LIPITOR) 80 MG tablet    Sig: Take 1 tablet (80 mg total) by mouth daily.    Dispense:  90 tablet    Refill:  3   empagliflozin (JARDIANCE) 10 MG TABS tablet    Sig: Take 1 tablet (10 mg total) by mouth daily before breakfast.    Dispense:  30 tablet    Refill:  5   sacubitril-valsartan (ENTRESTO) 24-26 MG    Sig: Take 1 tablet by mouth 2 (two) times daily.    Dispense:  60 tablet    Refill:  5   furosemide (LASIX) 20 MG tablet    Sig: Take 1 tablet (20 mg total) by mouth daily.    Dispense:  30 tablet    Refill:  5   Patient Instructions  Medication Instructions:  STOP: LOSARTAN  START: ENTRESTO- 24/26 TWICE DAILY   START: LASIX (FUROSEMIDE) 20mg  ONCE DAILY   START: JARDIANCE 10mg  ONCE DAILY   *If you need a refill on your cardiac medications before your next appointment, please call your pharmacy*  Lab Work:  Please return for Blood Work in 2 WEEKS. No appointment needed, lab here at the office is open Monday-Friday from 8AM to 4PM and closed daily for lunch from 12:45-1:45.    If you have labs (blood work) drawn today and your tests are completely normal, you will receive your results only by: MyChart Message (if you have MyChart) OR A paper copy in the mail If you have any lab test that is abnormal or we need to change your treatment, we will call you to review the results.  Follow-Up: At [facility], you and your health needs are our priority.  As part of our continuing mission to provide you with exceptional heart care, we have created  designated Provider Care Teams.  These Care  Teams include your primary Cardiologist (physician) and Advanced Practice Providers (APPs -  Physician Assistants and Nurse Practitioners) who all work together to provide you with the care you need, when you need it.  Your next appointment:   3 week(s)  THEN PATIENT WOULD LIKE TO FOLLOW UP IN [place] IN 2-3 MONTHS  Provider:   HAO OR CALLIE PA-C    I,Rachel Rivera,acting as a scribe for Parke Poisson, MD.,have documented all relevant documentation on the behalf of Parke Poisson, MD,as directed by  Parke Poisson, MD while in the presence of Parke Poisson, MD.  I, Parke Poisson, MD, have reviewed all documentation for the visit on 11/04/2022. The documentation on today's date of service for the exam, diagnosis, procedures, and orders are all accurate and complete.

## 2022-11-04 ENCOUNTER — Encounter: Payer: Self-pay | Admitting: Internal Medicine

## 2022-11-04 ENCOUNTER — Ambulatory Visit: Payer: BC Managed Care – PPO | Attending: Internal Medicine | Admitting: Internal Medicine

## 2022-11-04 VITALS — BP 150/76 | HR 72 | Ht 60.0 in | Wt 198.2 lb

## 2022-11-04 DIAGNOSIS — I10 Essential (primary) hypertension: Secondary | ICD-10-CM | POA: Diagnosis not present

## 2022-11-04 DIAGNOSIS — I5020 Unspecified systolic (congestive) heart failure: Secondary | ICD-10-CM

## 2022-11-04 DIAGNOSIS — E782 Mixed hyperlipidemia: Secondary | ICD-10-CM

## 2022-11-04 DIAGNOSIS — I2510 Atherosclerotic heart disease of native coronary artery without angina pectoris: Secondary | ICD-10-CM | POA: Diagnosis not present

## 2022-11-04 DIAGNOSIS — R0602 Shortness of breath: Secondary | ICD-10-CM

## 2022-11-04 MED ORDER — ATORVASTATIN CALCIUM 80 MG PO TABS: 80.0000 mg | ORAL_TABLET | Freq: Every day | ORAL | 3 refills | Status: DC

## 2022-11-04 MED ORDER — EMPAGLIFLOZIN 10 MG PO TABS: 10.0000 mg | ORAL_TABLET | Freq: Every day | ORAL | 5 refills | Status: DC

## 2022-11-04 MED ORDER — FUROSEMIDE 20 MG PO TABS: 20.0000 mg | ORAL_TABLET | Freq: Every day | ORAL | 5 refills | Status: DC

## 2022-11-04 MED ORDER — SACUBITRIL-VALSARTAN 24-26 MG PO TABS: 1.0000 | ORAL_TABLET | Freq: Two times a day (BID) | ORAL | 5 refills | Status: DC

## 2022-11-04 NOTE — Patient Instructions (Addendum)
Medication Instructions:  STOP: LOSARTAN  START: ENTRESTO- 24/26 TWICE DAILY   START: LASIX (FUROSEMIDE) 20mg  ONCE DAILY   START: JARDIANCE 10mg  ONCE DAILY   *If you need a refill on your cardiac medications before your next appointment, please call your pharmacy*  Lab Work:  Please return for Blood Work in 2 WEEKS. No appointment needed, lab here at the office is open Monday-Friday from 8AM to 4PM and closed daily for lunch from 12:45-1:45.    If you have labs (blood work) drawn today and your tests are completely normal, you will receive your results only by: MyChart Message (if you have MyChart) OR A paper copy in the mail If you have any lab test that is abnormal or we need to change your treatment, we will call you to review the results.  Follow-Up: At [facility], you and your health needs are our priority.  As part of our continuing mission to provide you with exceptional heart care, we have created designated Provider Care Teams.  These Care Teams include your primary Cardiologist (physician) and Advanced Practice Providers (APPs -  Physician Assistants and Nurse Practitioners) who all work together to provide you with the care you need, when you need it.  Your next appointment:   3 week(s)  THEN PATIENT WOULD LIKE TO FOLLOW UP IN [place] IN 2-3 MONTHS  Provider:   HAO OR CALLIE PA-C

## 2022-11-26 ENCOUNTER — Ambulatory Visit: Payer: BC Managed Care – PPO | Attending: Nurse Practitioner | Admitting: Nurse Practitioner

## 2022-11-26 NOTE — Progress Notes (Deleted)
Office Visit    Patient Name: Rachel [name] Date of Encounter: 11/26/2022  Primary Care Provider:  Elfredia Nevins, MD Primary Cardiologist:  Parke Poisson, MD  Chief Complaint    62 year old female with a history of nonobstructive CAD, chronic systolic heart failure, LBBB, hypertension, hyperlipidemia, hypothyroidism, COPD, and obesity who presents for follow-up related to heart failure.  Past Medical History    Past Medical History:  Diagnosis Date   Anemia    Anxiety    Arthritis    in both knees   Depression    GERD (gastroesophageal reflux disease)    Hyperlipidemia    Hypertension    Sleep apnea    Past Surgical History:  Procedure Laterality Date   CARPAL TUNNEL RELEASE Right    CATARACT EXTRACTION W/PHACO Right 02/19/2020   Procedure: CATARACT EXTRACTION PHACO AND INTRAOCULAR LENS PLACEMENT (IOC);  Surgeon: Fabio Pierce, MD;  Location: [facility];  Service: Ophthalmology;  Laterality: Right;  CDE: 4.83   CESAREAN SECTION     cesarian  1990   COLONOSCOPY N/A 08/10/2016   Procedure: COLONOSCOPY;  Surgeon: West Bali, MD;  Location: [facility];  Service: Endoscopy;  Laterality: N/A;  10:30 AM   fallopian tube removed N/A    LEFT HEART CATH AND CORONARY ANGIOGRAPHY N/A 02/19/2022   Procedure: LEFT HEART CATH AND CORONARY ANGIOGRAPHY;  Surgeon: Lennette Bihari, MD;  Location: [facility];  Service: Cardiovascular;  Laterality: N/A;   TOTAL KNEE ARTHROPLASTY Left 03/18/2021   Procedure: TOTAL KNEE ARTHROPLASTY;  Surgeon: Vickki Hearing, MD;  Location: [facility];  Service: Orthopedics;  Laterality: Left;    Allergies  Allergies  Allergen Reactions   Hydrocodone Nausea And Vomiting   Oxycodone Nausea And Vomiting    Codeine, hydrocodone, most pain pills.   Penicillins Itching, Nausea And Vomiting and Swelling    Has patient had a PCN reaction causing immediate rash, facial/tongue/throat swelling, SOB or lightheadedness with hypotension: Yes,  around the eyes Has patient had a PCN reaction causing severe rash involving mucus membranes or skin necrosis: No Has patient had a PCN reaction that required hospitalization: No Has patient had a PCN reaction occurring within the last 10 years: No  If all of the above answers are "NO", then may proceed with Cephalosporin use.    Precedex [Dexmedetomidine Hcl In Nacl] Other (See Comments)    Increased agitation/confusion in ICU [April 2018]   Seroquel [Quetiapine] Other (See Comments)    More agitated/confused per daughter Karel Jarvis when given in the past.   Shellfish Allergy Itching     Labs/Other Studies Reviewed    The following studies were reviewed today:  Cardiac Studies & Procedures   CARDIAC CATHETERIZATION  CARDIAC CATHETERIZATION 02/19/2022  Narrative   Prox RCA-1 lesion is 20% stenosed.   Prox RCA-2 lesion is 20% stenosed.   Mid RCA to Dist RCA lesion is 20% stenosed.   Acute Mrg lesion is 60% stenosed.   2nd Mrg lesion is 30% stenosed.   Prox LAD lesion is 5% stenosed.  Mild to moderate multivessel coronary calcification with mild-moderate nonobstructive CAD.  LV EDP 29 mmHg  RECOMMENDATION: Guideline directed medical therapy for HFrEF.  Aggressive lipid-lowering therapy with target LDL less than 70.  Findings Coronary Findings Diagnostic  Dominance: Right  Left Anterior Descending Prox LAD lesion is 5% stenosed.  Left Circumflex  First Obtuse Marginal Branch Vessel is small in size.  Second Obtuse Marginal Branch 2nd Mrg lesion is 30%  stenosed.  Right Coronary Artery Prox RCA-1 lesion is 20% stenosed. Prox RCA-2 lesion is 20% stenosed. Mid RCA to Dist RCA lesion is 20% stenosed.  Acute Marginal Branch Acute Mrg lesion is 60% stenosed.  Right Ventricular Branch Vessel is small in size.  Intervention  No interventions have been documented.     ECHOCARDIOGRAM  ECHOCARDIOGRAM COMPLETE 02/15/2022  Narrative ECHOCARDIOGRAM  REPORT    Patient Name:   Rachel [name] Date of Exam: 02/15/2022 Medical Rec #:  [id number]          Height:       60.0 in Accession #:    [id number]         Weight:       200.0 lb Date of Birth:  Jun 01, 1961           BSA:          1.866 m Patient Age:    60 years           BP:           117/63 mmHg Patient Gender: F                  HR:           90 bpm. Exam Location:  Inpatient  Procedure: 2D Echo, Cardiac Doppler and Color Doppler  Indications:    elevated Troponin  History:        Patient has prior history of Echocardiogram examinations, most recent 10/12/2016. CHF, COPD; Risk Factors:Hypertension and Dyslipidemia.  Sonographer:    Mikki Harbor Referring Phys: [id number] ANASTASSIA DOUTOVA   Sonographer Comments: Patient is obese. Image acquisition challenging due to respiratory motion. IMPRESSIONS   1. Left ventricular ejection fraction, by estimation, is 40 to 45%. The left ventricle has mildly decreased function. The left ventricle demonstrates global hypokinesis. There is mild concentric left ventricular hypertrophy. Left ventricular diastolic parameters are consistent with Grade II diastolic dysfunction (pseudonormalization). Elevated left atrial pressure. 2. Right ventricular systolic function is normal. The right ventricular size is normal. Tricuspid regurgitation signal is inadequate for assessing PA pressure. 3. Left atrial size was mildly dilated. 4. The mitral valve is normal in structure. Mild to moderate mitral valve regurgitation. 5. The aortic valve is tricuspid. Aortic valve regurgitation is not visualized.  Comparison(s): A prior study was performed on 10/12/2016. Prior images unable to be directly viewed, comparison made by report only. The left ventricular function is worsened. LBBB is also new since 2018.  FINDINGS Left Ventricle: Left ventricular ejection fraction, by estimation, is 40 to 45%. The left ventricle has mildly decreased function. The left  ventricle demonstrates global hypokinesis. The left ventricular internal cavity size was normal in size. There is mild concentric left ventricular hypertrophy. Abnormal (paradoxical) septal motion, consistent with left bundle branch block. Left ventricular diastolic parameters are consistent with Grade II diastolic dysfunction (pseudonormalization). Elevated left atrial pressure.  Right Ventricle: The right ventricular size is normal. No increase in right ventricular wall thickness. Right ventricular systolic function is normal. Tricuspid regurgitation signal is inadequate for assessing PA pressure.  Left Atrium: Left atrial size was mildly dilated.  Right Atrium: Right atrial size was normal in size.  Pericardium: There is no evidence of pericardial effusion.  Mitral Valve: The mitral valve is normal in structure. Mild mitral annular calcification. Mild to moderate mitral valve regurgitation, with centrally-directed jet. MV peak gradient, 9.1 mmHg. The mean mitral valve gradient is 3.0 mmHg.  Tricuspid Valve: The tricuspid valve is normal  in structure. Tricuspid valve regurgitation is not demonstrated.  Aortic Valve: The aortic valve is tricuspid. Aortic valve regurgitation is not visualized. Aortic valve mean gradient measures 5.0 mmHg. Aortic valve peak gradient measures 9.1 mmHg. Aortic valve area, by VTI measures 2.19 cm.  Pulmonic Valve: The pulmonic valve was not well visualized. Pulmonic valve regurgitation is not visualized.  Aorta: The aortic root is normal in size and structure.  IAS/Shunts: No atrial level shunt detected by color flow Doppler.   LEFT VENTRICLE PLAX 2D LVIDd:         4.60 cm     Diastology LVIDs:         3.30 cm     LV e' medial:    8.81 cm/s LV PW:         1.30 cm     LV E/e' medial:  14.7 LV IVS:        1.30 cm     LV e' lateral:   7.00 cm/s LVOT diam:     2.00 cm     LV E/e' lateral: 18.5 LV SV:         61 LV SV Index:   32 LVOT Area:     3.14  cm  LV Volumes (MOD) LV vol d, MOD A2C: 63.5 ml LV vol d, MOD A4C: 68.0 ml LV vol s, MOD A2C: 37.2 ml LV vol s, MOD A4C: 30.0 ml LV SV MOD A2C:     26.3 ml LV SV MOD A4C:     68.0 ml LV SV MOD BP:      35.9 ml  RIGHT VENTRICLE RV Basal diam:  3.00 cm RV Mid diam:    2.10 cm RV S prime:     12.60 cm/s TAPSE (M-mode): 2.0 cm  LEFT ATRIUM             Index        RIGHT ATRIUM           Index LA diam:        4.10 cm 2.20 cm/m   RA Area:     11.50 cm LA Vol (A2C):   76.5 ml 40.99 ml/m  RA Volume:   25.90 ml  13.88 ml/m LA Vol (A4C):   58.0 ml 31.08 ml/m LA Biplane Vol: 70.9 ml 37.99 ml/m AORTIC VALVE                     PULMONIC VALVE AV Area (Vmax):    2.29 cm      PV Vmax:       0.89 m/s AV Area (Vmean):   1.83 cm      PV Peak grad:  3.1 mmHg AV Area (VTI):     2.19 cm AV Vmax:           151.00 cm/s AV Vmean:          106.000 cm/s AV VTI:            0.277 m AV Peak Grad:      9.1 mmHg AV Mean Grad:      5.0 mmHg LVOT Vmax:         110.00 cm/s LVOT Vmean:        61.900 cm/s LVOT VTI:          0.193 m LVOT/AV VTI ratio: 0.70  AORTA Ao Root diam: 3.40 cm  MITRAL VALVE MV Area (PHT): 3.65 cm     SHUNTS MV Area VTI:  1.88 cm     Systemic VTI:  0.19 m MV Peak grad:  9.1 mmHg     Systemic Diam: 2.00 cm MV Mean grad:  3.0 mmHg MV Vmax:       1.51 m/s MV Vmean:      85.4 cm/s MV Decel Time: 208 msec MV E velocity: 129.50 cm/s MV A velocity: 120.00 cm/s MV E/A ratio:  1.08  Mihai Croitoru MD Electronically signed by Thurmon Fair MD Signature Date/Time: 02/15/2022/2:33:58 PM    Final     CT SCANS  CT CORONARY MORPH W/CTA COR W/SCORE 02/18/2022  Addendum 02/18/2022  4:50 PM ADDENDUM REPORT: 02/18/2022 15:46  EXAM: OVER-READ INTERPRETATION  CT CHEST  The following report is an over-read performed by radiologist Dr. Jacob Moores [facility] on 02/18/2022. This over-read does not include interpretation of cardiac or coronary anatomy  or pathology. The coronary CTA interpretation by the cardiologist is attached.  COMPARISON:  Chest CT dated February 14, 2022  FINDINGS: Vascular: Normal heart size. No pericardial effusion. No suspicious filling defects of the central pulmonary arteries. Normal caliber thoracic aorta with moderate calcified and noncalcified plaque. Moderate left main, lad and RCA coronary artery calcifications.  Mediastinum/Nodes: Small hiatal hernia. No pathologically enlarged lymph nodes seen in the chest.  Lungs/Pleura: Debris seen in the right mainstem and right lower lobe bronchi, new when compared to prior. Atelectasis of the right middle lobe and lingula. Right lower lobe consolidation, similar to prior exam. No pleural effusion or pneumothorax.  Upper Abdomen: No acute abnormality.  Musculoskeletal: No chest wall mass or suspicious bone lesions identified.  IMPRESSION: 1. Similar right lower lobe consolidation, likely due to infection or aspiration. 2. Debris seen in the right mainstem and right lower lobe bronchi, new when compared to prior, compatible with interval aspiration.   Electronically Signed By: Allegra Lai M.D. On: 02/18/2022 15:46  Narrative HISTORY: Left bundle branch block (LBBB), new Heart failure, known or suspected, initial workup  EXAM: Cardiac/Coronary CT  TECHNIQUE: The patient was scanned on a [employer].  PROTOCOL: A 130 kV prospective scan was triggered in the descending thoracic aorta at 111 HU's. Axial non-contrast 3 mm slices were carried out through the heart. The data set was analyzed on a dedicated work station and scored using the Agatston method. Gantry rotation speed was 250 msecs and collimation was .6 mm. Heart rate was optimized medically and sl NTG was given. The 3D data set was reconstructed in 5% intervals of the 35-75 % of the R-R cycle. Systolic and diastolic phases were analyzed on a dedicated work station using  MPR, MIP and VRT modes. The patient received OMNIPAQUE IOHEXOL 350 MG/ML SOLN of contrast.  FINDINGS: Coronary calcium score: The patient's coronary artery calcium score is 850, which places the patient in the 99th percentile.  Coronary arteries: Normal coronary origins.  Right dominance.  Right Coronary Artery: Normal caliber vessel, gives rise to PDA. Proximal calcified plaque, but there is artifact in this area, cannot determine degree of stenosis.  Left Main Coronary Artery: Normal caliber vessel. Proximal calcified plaque with 1-10% stenosis, distal mixed calcified and noncalcified plaque concerning for >50% stenosis but not well visualized.  Left Anterior Descending Coronary Artery: Normal caliber vessel. Diffuse proximal and mid mixed calcified and noncalcified plaque, not well visualized but concerning for at least >50% stenosis.  Left Circumflex Artery: Normal caliber vessel. Not well visualized, minimal scattered calcified plaque.  Aorta: Normal size, 32 mm at the mid  ascending aorta (level of the PA bifurcation) measured double oblique. Aortic atherosclerosis. No dissection seen in visualized portions of the aorta.  Aortic Valve: No calcifications. Trileaflet.  Other findings:  Normal pulmonary vein drainage into the left atrium.  Normal left atrial appendage without a thrombus.  Normal size of the pulmonary artery.  Normal appearance of the pericardium.  Elevated right hemidiaphragm.  Mild mitral annular calcification.  Overall quality: Fair to poor, moderate signal to noise artifact and some motion artifact.  IMPRESSION: 1. Nondiagnostic study, CADRADS=N. Suspect significant CAD based on limited evaluation, including distal LM, proximal LAD, and proximal RCA. Suggest cardiac catheterization for definitive evaluation.  2. Coronary calcium score of 850. This was 99th percentile for age and sex matched control.  3. Normal coronary origin with  right dominance.  4.  Aortic atherosclerosis  5.  Elevated right hemidiaphragm.  Findings communicated with rounding cardiology team.  INTERPRETATION:  CAD-RADS N: Non-diagnostic study. Obstructive CAD can't be excluded. Alternative evaluation is recommended.  Electronically Signed: By: Jodelle Red M.D. On: 02/18/2022 14:45         Recent Labs: 02/14/2022: TSH 0.105 02/19/2022: ALT 18; B Natriuretic Peptide 323.6; Magnesium 1.9 08/28/2022: BUN 17; Creatinine, Ser 0.66; Hemoglobin 12.4; Platelets 418; Potassium 4.4; Sodium 144  Recent Lipid Panel    Component Value Date/Time   TRIG 179 (H) 10/21/2016 2039    History of Present Illness    62 year old female with the above past medical history including nonobstructive CAD, chronic systolic heart failure, LBBB, hypertension, hyperlipidemia, hypothyroidism, COPD, and obesity.  He was initially evaluated by cardiology in 01/2022 during a hospitalization for sepsis.  Echocardiogram showed EF 45%, global hypokinesis, G2 DD.  EKG showed new left bundle branch block.  Coronary CT in 03/2022 showed calcium score of 850 (99th percentile).  She was noted to have aortic atherosclerosis and elevated right hemidiaphragm.  Follow-up cardiac catheterization 01/2022 showed mild to moderate multivessel coronary calcification with mild to moderate nonobstructive CAD.  She was noted to have proximal RCA 20%, mid RCA 20%, acute marginal lesion 60%, second marginal lesion 30%, proximal LAD 5%.  She was hospitalized in March 2024 in the setting of acute on chronic systolic heart failure.  She was last seen in the office on 11/04/2022 and noted ongoing shortness of breath.  Cardiac MRI was ordered and is pending.  She was transition from losartan to [place] and was started on Jardiance.  She presents today for follow-up.  Since her last visit she has  CAD: Chronic systolic heart failure: Hypertension: Hyperlipidemia: COPD: Disposition:   Home  Medications    Current Outpatient Medications  Medication Sig Dispense Refill   albuterol (PROVENTIL) (2.5 MG/3ML) 0.083% nebulizer solution Take 2.5 mg by nebulization every 4 (four) hours as needed for wheezing or shortness of breath.     aspirin EC 81 MG tablet Take by mouth.     atorvastatin (LIPITOR) 80 MG tablet Take 1 tablet (80 mg total) by mouth daily. 90 tablet 3   clonazePAM (KLONOPIN) 1 MG tablet Take 1 mg by mouth 3 (three) times daily as needed for anxiety.     diclofenac Sodium (VOLTAREN) 1 % GEL Apply 2 g topically 3 (three) times daily as needed (pain).     empagliflozin (JARDIANCE) 10 MG TABS tablet Take 1 tablet (10 mg total) by mouth daily before breakfast. 30 tablet 5   fluticasone (FLONASE) 50 MCG/ACT nasal spray Place 1 spray into both nostrils daily.     furosemide (  LASIX) 20 MG tablet Take 1 tablet (20 mg total) by mouth daily. 30 tablet 5   ipratropium (ATROVENT) 0.02 % nebulizer solution Take 0.25 mg by nebulization every 6 (six) hours as needed for wheezing or shortness of breath.     methimazole (TAPAZOLE) 5 MG tablet Take 1 tablet (5 mg total) by mouth 2 (two) times daily. 60 tablet 0   metoprolol succinate (TOPROL-XL) 100 MG 24 hr tablet Take 1 tablet (100 mg total) by mouth daily. Take with or immediately following a meal. 30 tablet 0   phentermine (ADIPEX-P) 37.5 MG tablet Take 37.5 mg by mouth daily.     sacubitril-valsartan (ENTRESTO) 24-26 MG Take 1 tablet by mouth 2 (two) times daily. 60 tablet 5   spironolactone (ALDACTONE) 25 MG tablet Take 1 tablet (25 mg total) by mouth daily. 30 tablet 0   Current Facility-Administered Medications  Medication Dose Route Frequency Provider Last Rate Last Admin   bupivacaine-meloxicam ER (ZYNRELEF) injection 400 mg  400 mg Infiltration Once Vickki Hearing, MD         Review of Systems    ***.  All other systems reviewed and are otherwise negative except as noted above.    Physical Exam    VS:  There were no  vitals taken for this visit. , BMI There is no height or weight on file to calculate BMI.     GEN: Well nourished, well developed, in no acute distress. HEENT: normal. Neck: Supple, no JVD, carotid bruits, or masses. Cardiac: RRR, no murmurs, rubs, or gallops. No clubbing, cyanosis, edema.  Radials/DP/PT 2+ and equal bilaterally.  Respiratory:  Respirations regular and unlabored, clear to auscultation bilaterally. GI: Soft, nontender, nondistended, BS + x 4. MS: no deformity or atrophy. Skin: warm and dry, no rash. Neuro:  Strength and sensation are intact. Psych: Normal affect.  Accessory Clinical Findings    ECG personally reviewed by me today - *** - no acute changes.   Lab Results  Component Value Date   WBC 6.7 08/28/2022   HGB 12.4 08/28/2022   HCT 38.2 08/28/2022   MCV 87 08/28/2022   PLT 418 08/28/2022   Lab Results  Component Value Date   CREATININE 0.66 08/28/2022   BUN 17 08/28/2022   NA 144 08/28/2022   K 4.4 08/28/2022   CL 106 08/28/2022   CO2 21 08/28/2022   Lab Results  Component Value Date   ALT 18 02/19/2022   AST 14 (L) 02/19/2022   ALKPHOS 80 02/19/2022   BILITOT 0.6 02/19/2022   Lab Results  Component Value Date   TRIG 179 (H) 10/21/2016    No results found for: "HGBA1C"  Assessment & Plan    1.  ***  No BP recorded.  {Refresh Note OR Click here to enter BP  :1}***   Joylene Grapes, NP 11/26/2022, 6:21 AM

## 2022-11-27 ENCOUNTER — Encounter: Payer: BC Managed Care – PPO | Admitting: Orthopedic Surgery

## 2022-12-11 ENCOUNTER — Ambulatory Visit (INDEPENDENT_AMBULATORY_CARE_PROVIDER_SITE_OTHER): Payer: BC Managed Care – PPO | Admitting: Orthopedic Surgery

## 2022-12-11 ENCOUNTER — Encounter: Payer: Self-pay | Admitting: Orthopedic Surgery

## 2022-12-11 ENCOUNTER — Other Ambulatory Visit (INDEPENDENT_AMBULATORY_CARE_PROVIDER_SITE_OTHER): Payer: BC Managed Care – PPO

## 2022-12-11 VITALS — BP 123/66 | HR 73 | Ht 60.0 in | Wt 195.0 lb

## 2022-12-11 DIAGNOSIS — Z6841 Body Mass Index (BMI) 40.0 and over, adult: Secondary | ICD-10-CM | POA: Diagnosis not present

## 2022-12-11 DIAGNOSIS — F330 Major depressive disorder, recurrent, mild: Secondary | ICD-10-CM | POA: Diagnosis not present

## 2022-12-11 DIAGNOSIS — M1711 Unilateral primary osteoarthritis, right knee: Secondary | ICD-10-CM | POA: Diagnosis not present

## 2022-12-11 DIAGNOSIS — J449 Chronic obstructive pulmonary disease, unspecified: Secondary | ICD-10-CM | POA: Diagnosis not present

## 2022-12-11 DIAGNOSIS — I10 Essential (primary) hypertension: Secondary | ICD-10-CM | POA: Diagnosis not present

## 2022-12-11 DIAGNOSIS — G8929 Other chronic pain: Secondary | ICD-10-CM

## 2022-12-11 DIAGNOSIS — M25561 Pain in right knee: Secondary | ICD-10-CM

## 2022-12-11 DIAGNOSIS — F419 Anxiety disorder, unspecified: Secondary | ICD-10-CM | POA: Diagnosis not present

## 2022-12-11 MED ORDER — METHYLPREDNISOLONE ACETATE 40 MG/ML IJ SUSP
40.0000 mg | Freq: Once | INTRAMUSCULAR | Status: AC
  Administered 2022-12-11: 40 mg via INTRA_ARTICULAR

## 2022-12-11 MED ORDER — MELOXICAM 7.5 MG PO TABS
7.5000 mg | ORAL_TABLET | Freq: Every day | ORAL | 5 refills | Status: DC
Start: 2022-12-11 — End: 2023-01-26

## 2022-12-11 NOTE — Patient Instructions (Signed)

## 2022-12-11 NOTE — Progress Notes (Signed)
Chief Complaint  Patient presents with   Knee Pain    Right for a couple months swells also    NO injury   2 months pain and swelling right knee Take OTC meds no relief   Past Medical History:  Diagnosis Date   Anemia    Anxiety    Arthritis    in both knees   Depression    GERD (gastroesophageal reflux disease)    Hyperlipidemia    Hypertension    Sleep apnea    BP 123/66   Pulse 73   Ht 5' (1.524 m)   Wt 195 lb (88.5 kg)   BMI 38.08 kg/m   Gait slight limp   Physical Exam Vitals and nursing note reviewed.  Constitutional:      Appearance: Normal appearance.  HENT:     Head: Normocephalic and atraumatic.  Eyes:     General: No scleral icterus.       Right eye: No discharge.        Left eye: No discharge.     Extraocular Movements: Extraocular movements intact.     Conjunctiva/sclera: Conjunctivae normal.     Pupils: Pupils are equal, round, and reactive to light.  Cardiovascular:     Rate and Rhythm: Normal rate.     Pulses: Normal pulses.  Musculoskeletal:     Right knee: Effusion present.  Skin:    General: Skin is warm and dry.     Capillary Refill: Capillary refill takes less than 2 seconds.  Neurological:     General: No focal deficit present.     Mental Status: She is alert and oriented to person, place, and time.  Psychiatric:        Mood and Affect: Mood normal.        Behavior: Behavior normal.        Thought Content: Thought content normal.        Judgment: Judgment normal.     Right Knee Exam   Muscle Strength  The patient has normal right knee strength.  Tenderness  The patient is experiencing tenderness in the medial joint line and lateral joint line.  Range of Motion  Extension:  normal  Flexion:  120   Tests  Varus: negative Valgus: negative Drawer:  Anterior - negative    Posterior - negative  Other  Erythema: absent Scars: absent Pulse: present Effusion: effusion present     Imaging  Severe OA right knee with  effusion  Assessment and plan   Encounter Diagnoses  Name Primary?   Chronic pain of right knee    Primary osteoarthritis of right knee Yes    Procedure note injection and aspiration right knee joint  Verbal consent was obtained to aspirate and inject the right knee joint   Timeout was completed to confirm the site of aspiration and injection  An 18-gauge needle was used to aspirate the knee joint from a suprapatellar lateral approach.  The medications used were 40 mg of Depo-Medrol and 1% lidocaine 3 cc  Anesthesia was provided by ethyl chloride and the skin was prepped with alcohol.  After cleaning the skin with alcohol an 18-gauge needle was used to aspirate the right knee joint.  We obtained 40-50  cc of fluid CLR YEL   We follow this by injection of 40 mg of Depo-Medrol and 3 cc 1% lidocaine.  There were no complications. A sterile bandage was applied.  Meds ordered this encounter  Medications   meloxicam (MOBIC) 7.5  MG tablet    Sig: Take 1 tablet (7.5 mg total) by mouth daily.    Dispense:  30 tablet    Refill:  5

## 2022-12-11 NOTE — Addendum Note (Signed)
Addended byCaffie Damme on: 12/11/2022 11:51 AM   Modules accepted: Orders

## 2023-01-26 ENCOUNTER — Encounter: Payer: Self-pay | Admitting: Student

## 2023-01-26 ENCOUNTER — Ambulatory Visit: Payer: BC Managed Care – PPO | Attending: Student | Admitting: Student

## 2023-01-26 VITALS — BP 136/76 | HR 70 | Ht 60.0 in | Wt 201.0 lb

## 2023-01-26 DIAGNOSIS — I10 Essential (primary) hypertension: Secondary | ICD-10-CM

## 2023-01-26 DIAGNOSIS — E785 Hyperlipidemia, unspecified: Secondary | ICD-10-CM

## 2023-01-26 DIAGNOSIS — Z79899 Other long term (current) drug therapy: Secondary | ICD-10-CM

## 2023-01-26 DIAGNOSIS — I2510 Atherosclerotic heart disease of native coronary artery without angina pectoris: Secondary | ICD-10-CM

## 2023-01-26 DIAGNOSIS — I5020 Unspecified systolic (congestive) heart failure: Secondary | ICD-10-CM | POA: Diagnosis not present

## 2023-01-26 MED ORDER — METOPROLOL SUCCINATE ER 100 MG PO TB24: 100.0000 mg | ORAL_TABLET | Freq: Every day | ORAL | 3 refills | Status: DC

## 2023-01-26 NOTE — Progress Notes (Signed)
Cardiology Office Note    Date:  01/26/2023  ID:  TYNIAH HONTS, DOB Jun 13, 1961, MRN [id number] Cardiologist: Parke Poisson, MD  --> Patient requests to switch to [facility]  History of Present Illness:    NAYMA FAVA is a 62 y.o. 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  She was last examined by Dr. Jacques Navy in 10/2022 and had recently been hospitalized for an acute CHF exacerbation. A cardiac MRI had previously been recommended but unable to be performed due to her work schedule and she was encouraged to reschedule this. She was started on Jardiance 10 mg daily and Losartan was transitioned to Entresto 24-26 mg twice daily.   In talking with the patient today, she reports overall feeling well since her last visit.  Says that her breathing has improved since being started on Entresto. Denies any specific orthopnea, PND or pitting edema. Does have swelling along her knee joints but has known arthritis. Denies any recent chest pain or palpitations. In reviewing her medication list, she is listed as taking Metoprolol Tartrate and Metoprolol Succinate. This was verified with her pharmacy and they reported that Metoprolol Tartrate was sent in by her PCP as a once daily medication.   Studies Reviewed:   EKG: EKG is not ordered today.   Echocardiogram: 01/2022 IMPRESSIONS     1. Left ventricular ejection fraction, by estimation, is 40 to 45%. The  left ventricle has mildly decreased function. The left ventricle  demonstrates global hypokinesis. There is mild concentric left ventricular  hypertrophy. Left ventricular diastolic  parameters are consistent with Grade II diastolic dysfunction  (pseudonormalization). Elevated left atrial pressure.   2. Right ventricular systolic  function is normal. The right ventricular  size is normal. Tricuspid regurgitation signal is inadequate for assessing  PA pressure.   3. Left atrial size was mildly dilated.   4. The mitral valve is normal in structure. Mild to moderate mitral valve  regurgitation.   5. The aortic valve is tricuspid. Aortic valve regurgitation is not  visualized.   Comparison(s): A prior study was performed on 10/12/2016. Prior images  unable to be directly viewed, comparison made by report only. The left  ventricular function is worsened. LBBB is also new since 2018.   LHC: 01/2022   Prox RCA-1 lesion is 20% stenosed.   Prox RCA-2 lesion is 20% stenosed.   Mid RCA to Dist RCA lesion is 20% stenosed.   Acute Mrg lesion is 60% stenosed.   2nd Mrg lesion is 30% stenosed.   Prox LAD lesion is 5% stenosed.   Mild to moderate multivessel coronary calcification with mild-moderate nonobstructive CAD.   LV EDP 29 mmHg   RECOMMENDATION: Guideline directed medical therapy for HFrEF.  Aggressive lipid-lowering therapy with target LDL less than 70.  Echocardiogram: 08/2022 Summary   1. The left ventricle is mildly dilated in size with mildly increased wall  thickness.   2. The left ventricular systolic function is mildly decreased, LVEF is  visually estimated at 40-45%.    3. There is grade I diastolic dysfunction (impaired relaxation).    4. There is mild mitral valve regurgitation.    5. The left atrium is mildly to moderately dilated in size.    6. The right ventricle is normal in size, with  normal systolic function.    Physical Exam:   VS:  BP 136/76   Pulse 70   Ht 5' (1.524 m)   Wt 201 lb (91.2 kg)   SpO2 96%   BMI 39.26 kg/m    Wt Readings from Last 3 Encounters:  01/26/23 201 lb (91.2 kg)  12/11/22 195 lb (88.5 kg)  11/04/22 198 lb 3.2 oz (89.9 kg)     GEN: Well nourished, well developed female appearing in no acute distress NECK: No JVD; No carotid bruits CARDIAC: RRR, no  murmurs, rubs, gallops RESPIRATORY:  Clear to auscultation without rales, wheezing or rhonchi  ABDOMEN: Appears non-distended. No obvious abdominal masses. EXTREMITIES: No clubbing or cyanosis. No pitting edema.  Distal pedal pulses are 2+ bilaterally.   Assessment and Plan:   1. Chronic HFmrEF - Her EF was at 40-45% by echo in 01/2022 and 08/2022. She appears euvolemic by examination today and reports her dyspnea has improved over the past few months.   - She is currently taking Lopressor 100 mg daily, Entresto 24-26 mg twice daily, Spironolactone 25 mg daily and Jardiance 10 mg daily. Will switch Lopressor to Toprol-XL 100 mg daily for sustained release. Creatinine was stable at 0.65 by recent labs in 08/2022. She is scheduled for a follow-up cardiac MRI in 01/2023 and was encouraged to keep this appointment.  2. CAD - Prior catheterization in 01/2022 showed mild to moderate nonobstructive disease. She denies any recent anginal symptoms. - Continue ASA 81 mg daily and Atorvastatin 80 mg daily.  3. HTN - BP is at 136/76 during today's visit. Continue Entresto 24-26 mg twice daily and Spironolactone 25 mg daily. She has been taking Lopressor 100 mg once daily and will adjust to Toprol-XL 100 mg once daily for sustained release.  4. HLD - Followed by her PCP. She remains on Atorvastatin 80 mg daily.   Signed, Ellsworth Lennox, PA-C

## 2023-01-26 NOTE — Patient Instructions (Signed)
Medication Instructions:   Restart Toprol-XL 100mg  daily.   *If you need a refill on your cardiac medications before your next appointment, please call your pharmacy*   Lab Work:  Basic Metabolic Panel today.   If you have labs (blood work) drawn today and your tests are completely normal, you will receive your results only by: MyChart Message (if you have MyChart) OR A paper copy in the mail If you have any lab test that is abnormal or we need to change your treatment, we will call you to review the results.   Follow-Up: At [facility], you and your health needs are our priority.  As part of our continuing mission to provide you with exceptional heart care, we have created designated Provider Care Teams.  These Care Teams include your primary Cardiologist (physician) and Advanced Practice Providers (APPs -  Physician Assistants and Nurse Practitioners) who all work together to provide you with the care you need, when you need it.  We recommend signing up for the patient portal called "MyChart".  Sign up information is provided on this After Visit Summary.  MyChart is used to connect with patients for Virtual Visits (Telemedicine).  Patients are able to view lab/test results, encounter notes, upcoming appointments, etc.  Non-urgent messages can be sent to your provider as well.   To learn more about what you can do with MyChart, go to [email or web address].    Your next appointment:   3-4 months  Provider:   Dr. Jenene Slicker or Randall An, PA

## 2023-02-18 ENCOUNTER — Encounter (HOSPITAL_COMMUNITY): Payer: Self-pay

## 2023-02-19 ENCOUNTER — Telehealth (HOSPITAL_COMMUNITY): Payer: Self-pay | Admitting: *Deleted

## 2023-02-19 NOTE — Telephone Encounter (Signed)
 Attempted to call patient regarding upcoming cardiac MRI appointment. Left message on voicemail with name and callback number Johney Frame RN Navigator Cardiac Imaging [facility] [phone] Office

## 2023-02-22 ENCOUNTER — Ambulatory Visit (HOSPITAL_COMMUNITY)
Admission: RE | Admit: 2023-02-22 | Discharge: 2023-02-22 | Disposition: A | Discharge status: Home / Self Care | Payer: BC Managed Care – PPO | Source: Ambulatory Visit | Attending: General Practice | Admitting: General Practice

## 2023-02-22 DIAGNOSIS — I5020 Unspecified systolic (congestive) heart failure: Secondary | ICD-10-CM | POA: Insufficient documentation

## 2023-02-22 MED ORDER — GADOBUTROL 1 MMOL/ML IV SOLN
10.0000 mL | Freq: Once | INTRAVENOUS | Status: AC | PRN
  Administered 2023-02-22: 10 mL via INTRAVENOUS

## 2023-03-23 DIAGNOSIS — E119 Type 2 diabetes mellitus without complications: Secondary | ICD-10-CM | POA: Diagnosis not present

## 2023-03-23 DIAGNOSIS — Z1331 Encounter for screening for depression: Secondary | ICD-10-CM | POA: Diagnosis not present

## 2023-03-23 DIAGNOSIS — Z0001 Encounter for general adult medical examination with abnormal findings: Secondary | ICD-10-CM | POA: Diagnosis not present

## 2023-03-23 DIAGNOSIS — J449 Chronic obstructive pulmonary disease, unspecified: Secondary | ICD-10-CM | POA: Diagnosis not present

## 2023-03-23 DIAGNOSIS — I10 Essential (primary) hypertension: Secondary | ICD-10-CM | POA: Diagnosis not present

## 2023-03-23 DIAGNOSIS — F419 Anxiety disorder, unspecified: Secondary | ICD-10-CM | POA: Diagnosis not present

## 2023-03-23 DIAGNOSIS — Z9229 Personal history of other drug therapy: Secondary | ICD-10-CM | POA: Diagnosis not present

## 2023-03-23 DIAGNOSIS — Z6841 Body Mass Index (BMI) 40.0 and over, adult: Secondary | ICD-10-CM | POA: Diagnosis not present

## 2023-03-25 DIAGNOSIS — Z9229 Personal history of other drug therapy: Secondary | ICD-10-CM | POA: Diagnosis not present

## 2023-03-25 DIAGNOSIS — E559 Vitamin D deficiency, unspecified: Secondary | ICD-10-CM | POA: Diagnosis not present

## 2023-03-25 DIAGNOSIS — I10 Essential (primary) hypertension: Secondary | ICD-10-CM | POA: Diagnosis not present

## 2023-03-25 DIAGNOSIS — E118 Type 2 diabetes mellitus with unspecified complications: Secondary | ICD-10-CM | POA: Diagnosis not present

## 2023-03-25 DIAGNOSIS — Z0001 Encounter for general adult medical examination with abnormal findings: Secondary | ICD-10-CM | POA: Diagnosis not present

## 2023-04-28 ENCOUNTER — Encounter: Payer: Self-pay | Admitting: Student

## 2023-04-28 ENCOUNTER — Telehealth: Payer: Self-pay | Admitting: Student

## 2023-04-28 ENCOUNTER — Ambulatory Visit: Payer: BC Managed Care – PPO | Attending: Student | Admitting: Student

## 2023-04-28 VITALS — BP 140/82 | HR 77 | Ht 60.0 in | Wt 206.0 lb

## 2023-04-28 DIAGNOSIS — I10 Essential (primary) hypertension: Secondary | ICD-10-CM | POA: Diagnosis not present

## 2023-04-28 DIAGNOSIS — I2510 Atherosclerotic heart disease of native coronary artery without angina pectoris: Secondary | ICD-10-CM

## 2023-04-28 DIAGNOSIS — E785 Hyperlipidemia, unspecified: Secondary | ICD-10-CM

## 2023-04-28 DIAGNOSIS — I447 Left bundle-branch block, unspecified: Secondary | ICD-10-CM

## 2023-04-28 DIAGNOSIS — I5022 Chronic systolic (congestive) heart failure: Secondary | ICD-10-CM | POA: Diagnosis not present

## 2023-04-28 DIAGNOSIS — Z79899 Other long term (current) drug therapy: Secondary | ICD-10-CM

## 2023-04-28 MED ORDER — SACUBITRIL-VALSARTAN 49-51 MG PO TABS: 1.0000 | ORAL_TABLET | Freq: Two times a day (BID) | ORAL | 11 refills | Status: DC

## 2023-04-28 NOTE — Patient Instructions (Signed)
Medication Instructions:  Your physician has recommended you make the following change in your medication:   -Increase Entresto to 49-51 mg tablet twice daily.   *If you need a refill on your cardiac medications before your next appointment, please call your pharmacy*   Lab Work: In 2 weeks: -BMET  If you have labs (blood work) drawn today and your tests are completely normal, you will receive your results only by: MyChart Message (if you have MyChart) OR A paper copy in the mail If you have any lab test that is abnormal or we need to change your treatment, we will call you to review the results.   Testing/Procedures: None   Follow-Up: At [facility], you and your health needs are our priority.  As part of our continuing mission to provide you with exceptional heart care, we have created designated Provider Care Teams.  These Care Teams include your primary Cardiologist (physician) and Advanced Practice Providers (APPs -  Physician Assistants and Nurse Practitioners) who all work together to provide you with the care you need, when you need it.  We recommend signing up for the patient portal called "MyChart".  Sign up information is provided on this After Visit Summary.  MyChart is used to connect with patients for Virtual Visits (Telemedicine).  Patients are able to view lab/test results, encounter notes, upcoming appointments, etc.  Non-urgent messages can be sent to your provider as well.   To learn more about what you can do with MyChart, go to [email or web address].    Your next appointment:   3 month(s)  Provider:   Parke Poisson, MD  or Randall An, PA-C   Other Instructions

## 2023-04-28 NOTE — Telephone Encounter (Signed)
    Please let the patient know I reviewed with Dr. Jacques Navy and she was in agreement with EP evaluation for consideration of CRT given her LBBB and cardiomyopathy. If performed by Dr. Ladona Ridgel or Dr. Nelly Laurence, would try to arrange consult in [place] or [place]. Otherwise, would need to get arranged in [place].   Signed, Ellsworth Lennox, PA-C 04/28/2023, 8:51 PM

## 2023-04-28 NOTE — Progress Notes (Signed)
Cardiology Office Note    Date:  04/28/2023  ID:  Rachel [name], DOB 1960/10/10, MRN [id number] Cardiologist: Parke Poisson, MD  --> Patient requests to switch to [facility]   History of Present Illness:    Rachel [name] is a 62 y.o. 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  She was examined by myself in 12/2022 and reported overall feeling well and denied any recent chest pain or dyspnea on exertion. She had been taking Metoprolol Tartrate as a once daily medication as this had been refilled by her PCP and this was switched to Toprol-XL 100 mg daily for sustained release. She was continued on Entresto 24-26 mg twice daily, Spironolactone 25 mg daily and Jardiance 10 mg daily with plans to keep scheduled follow-up cardiac MRI. This was performed in 01/2023 and showed her LVEF was at 35% and RV function was low-normal at 52%. 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 LBBB.  In talking with the patient today, she reports having a sore throat today and she has recently been around her grandchild who was sick. Denies any specific respiratory issues at this time. She overall feels drained and fatigued and thought this might be secondary to vitamin D deficiency but she has been on supplementation per her PCP for several weeks with no improvement in symptoms. Denies any specific orthopnea, PND or pitting edema. Does have swelling at times which is isolated along her knees and ankles. No recent chest pain or palpitations.  Studies Reviewed:   EKG: EKG is not ordered today. EKG from 08/28/2022 is reviewed and shows NSR, HR 73 with LBBB.   Cardiac Catheterization: 01/2022   Prox RCA-1 lesion is 20% stenosed.   Prox  RCA-2 lesion is 20% stenosed.   Mid RCA to Dist RCA lesion is 20% stenosed.   Acute Mrg lesion is 60% stenosed.   2nd Mrg lesion is 30% stenosed.   Prox LAD lesion is 5% stenosed.   Mild to moderate multivessel coronary calcification with mild-moderate nonobstructive CAD.   LV EDP 29 mmHg   RECOMMENDATION: Guideline directed medical therapy for HFrEF.  Aggressive lipid-lowering therapy with target LDL less than 70.  cMRI: 01/2023 IMPRESSION: 1. Left ventricular chamber size is normal with moderately reduced LV systolic function, LVEF 35%.   2. Right ventricular chamber size is normal with low normal RV systolic function, RVEF 52%.   3. No delayed myocardial enhancement. No myocardial edema. ECV 31%, nonspecific elevation.   4. 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 LBBB cardiomyopathy.    Risk Assessment/Calculations:     HYPERTENSION CONTROL Vitals:   04/28/23 1312 04/28/23 1642  BP: (!) 140/82 (!) 140/82    The patient's blood pressure is elevated above target today.  In order to address the patient's elevated BP: A current anti-hypertensive medication was adjusted today.          Physical Exam:   VS:  BP (!) 140/82   Pulse 77   Ht 5' (1.524 m)   Wt 206 lb (93.4 kg)   SpO2 93%   BMI 40.23 kg/m    Wt Readings from Last 3 Encounters:  04/28/23 206 lb (93.4 kg)  01/26/23 201 lb (91.2 kg)  12/11/22 195 lb (88.5 kg)     GEN: Well nourished, well developed in no acute distress NECK: No JVD; No carotid bruits CARDIAC: RRR, no murmurs, rubs, gallops RESPIRATORY:  Clear to auscultation without rales, wheezing or rhonchi  ABDOMEN: Appears non-distended. No obvious abdominal masses. EXTREMITIES: No clubbing or cyanosis. No pitting edema.  Distal pedal pulses are 2+ bilaterally.   Assessment and Plan:   1. Chronic HFrEF - She has a known cardiomyopathy with a EF at 40 to 45% by echocardiogram  in 01/2022 and this was read as 35% by cardiac MRI in 01/2023. Etiology of her cardiomyopathy felt to be due to hypertensive heart disease or LBBB. She appears euvolemic by examination today but does report generalized fatigue as discussed above. She is currently taking Jardiance 10 mg daily, Toprol-XL 100 mg daily, Entresto 24-26 mg twice daily and Spironolactone 25 mg daily. Will titrate Entresto to 49-51 mg twice daily and recheck a BMET in 2 weeks.  - Will reach out to Dr. Jacques Navy to see if she thinks the patient would be a candidate for CRT given her cardiomyopathy and LBBB (QRS was at 140 ms in 08/2022). We did briefly review this today and the patient is in agreement with meeting with EP if deemed to be a candidate for this.   2. CAD - Prior catheterization in 01/2022 showed mild to moderate nonobstructive disease as outlined above with medical management recommended at that time. She reports fatigue but denies any specific dyspnea on exertion or chest pain. Continue ASA 81 mg daily, Atorvastatin 80 mg daily and Toprol-XL 100 mg daily.  3. HTN - BP is at 140/82 during today's visit. Will titrate Entresto from 24-26 mg twice daily to 49-51 mg twice daily. Continue Spironolactone 25 mg daily and Toprol-XL 100 mg daily.  4. HLD - LDL was at 106 in 02/2023 by review of [facility] DXA. Around that timeframe, she reports taking a lower dose of Atorvastatin and she is now taking 80 mg daily. If LDL remains above goal over time, would switch to Crestor 40 mg daily or add Zetia 10 mg daily.   Signed, Ellsworth Lennox, PA-C

## 2023-04-30 NOTE — Telephone Encounter (Signed)
Called pt. No answer. No voicemail.

## 2023-05-02 ENCOUNTER — Other Ambulatory Visit: Payer: Self-pay | Admitting: Internal Medicine

## 2023-05-06 NOTE — Addendum Note (Signed)
Addended by: Kerney Elbe on: 05/06/2023 01:26 PM   Modules accepted: Orders

## 2023-05-06 NOTE — Telephone Encounter (Signed)
Patient notified and agrees with EP referral.

## 2023-05-07 ENCOUNTER — Other Ambulatory Visit: Payer: Self-pay | Admitting: Internal Medicine

## 2023-05-16 ENCOUNTER — Other Ambulatory Visit: Payer: Self-pay | Admitting: Internal Medicine

## 2023-06-28 DIAGNOSIS — R058 Other specified cough: Secondary | ICD-10-CM | POA: Diagnosis not present

## 2023-06-28 DIAGNOSIS — R0789 Other chest pain: Secondary | ICD-10-CM | POA: Diagnosis not present

## 2023-06-28 DIAGNOSIS — Z885 Allergy status to narcotic agent status: Secondary | ICD-10-CM | POA: Diagnosis not present

## 2023-06-28 DIAGNOSIS — I10 Essential (primary) hypertension: Secondary | ICD-10-CM | POA: Diagnosis not present

## 2023-06-28 DIAGNOSIS — Z7982 Long term (current) use of aspirin: Secondary | ICD-10-CM | POA: Diagnosis not present

## 2023-06-28 DIAGNOSIS — Z880 Allergy status to penicillin: Secondary | ICD-10-CM | POA: Diagnosis not present

## 2023-06-28 DIAGNOSIS — F419 Anxiety disorder, unspecified: Secondary | ICD-10-CM | POA: Diagnosis not present

## 2023-06-28 DIAGNOSIS — R059 Cough, unspecified: Secondary | ICD-10-CM | POA: Diagnosis not present

## 2023-06-28 DIAGNOSIS — K219 Gastro-esophageal reflux disease without esophagitis: Secondary | ICD-10-CM | POA: Diagnosis not present

## 2023-06-28 DIAGNOSIS — Z79899 Other long term (current) drug therapy: Secondary | ICD-10-CM | POA: Diagnosis not present

## 2023-06-28 DIAGNOSIS — J9811 Atelectasis: Secondary | ICD-10-CM | POA: Diagnosis not present

## 2023-06-28 DIAGNOSIS — Z7951 Long term (current) use of inhaled steroids: Secondary | ICD-10-CM | POA: Diagnosis not present

## 2023-06-28 DIAGNOSIS — R0602 Shortness of breath: Secondary | ICD-10-CM | POA: Diagnosis not present

## 2023-06-28 DIAGNOSIS — Z20822 Contact with and (suspected) exposure to covid-19: Secondary | ICD-10-CM | POA: Diagnosis not present

## 2023-06-28 DIAGNOSIS — J189 Pneumonia, unspecified organism: Secondary | ICD-10-CM | POA: Diagnosis not present

## 2023-06-28 DIAGNOSIS — R0981 Nasal congestion: Secondary | ICD-10-CM | POA: Diagnosis not present

## 2023-06-28 DIAGNOSIS — Z888 Allergy status to other drugs, medicaments and biological substances status: Secondary | ICD-10-CM | POA: Diagnosis not present

## 2023-06-28 DIAGNOSIS — J449 Chronic obstructive pulmonary disease, unspecified: Secondary | ICD-10-CM | POA: Diagnosis not present

## 2023-06-28 DIAGNOSIS — Z91013 Allergy to seafood: Secondary | ICD-10-CM | POA: Diagnosis not present

## 2023-06-28 DIAGNOSIS — Z87891 Personal history of nicotine dependence: Secondary | ICD-10-CM | POA: Diagnosis not present

## 2023-07-06 ENCOUNTER — Encounter: Payer: Self-pay | Admitting: Internal Medicine

## 2023-07-12 ENCOUNTER — Other Ambulatory Visit (HOSPITAL_COMMUNITY)
Admission: RE | Admit: 2023-07-12 | Discharge: 2023-07-12 | Disposition: A | Discharge status: Home / Self Care | Payer: BC Managed Care – PPO | Source: Ambulatory Visit | Attending: Medical | Admitting: Medical

## 2023-07-12 ENCOUNTER — Ambulatory Visit: Payer: BC Managed Care – PPO | Attending: Medical | Admitting: Medical

## 2023-07-12 ENCOUNTER — Ambulatory Visit: Payer: BC Managed Care – PPO | Admitting: Nurse Practitioner

## 2023-07-12 ENCOUNTER — Encounter: Payer: Self-pay | Admitting: Medical

## 2023-07-12 ENCOUNTER — Encounter: Payer: Self-pay | Admitting: Internal Medicine

## 2023-07-12 VITALS — BP 126/68 | HR 74 | Ht 60.0 in | Wt 213.0 lb

## 2023-07-12 DIAGNOSIS — R0602 Shortness of breath: Secondary | ICD-10-CM

## 2023-07-12 DIAGNOSIS — I10 Essential (primary) hypertension: Secondary | ICD-10-CM

## 2023-07-12 DIAGNOSIS — I2510 Atherosclerotic heart disease of native coronary artery without angina pectoris: Secondary | ICD-10-CM

## 2023-07-12 DIAGNOSIS — I5022 Chronic systolic (congestive) heart failure: Secondary | ICD-10-CM

## 2023-07-12 DIAGNOSIS — G4733 Obstructive sleep apnea (adult) (pediatric): Secondary | ICD-10-CM

## 2023-07-12 DIAGNOSIS — J449 Chronic obstructive pulmonary disease, unspecified: Secondary | ICD-10-CM

## 2023-07-12 DIAGNOSIS — E782 Mixed hyperlipidemia: Secondary | ICD-10-CM

## 2023-07-12 DIAGNOSIS — J189 Pneumonia, unspecified organism: Secondary | ICD-10-CM | POA: Diagnosis not present

## 2023-07-12 DIAGNOSIS — I447 Left bundle-branch block, unspecified: Secondary | ICD-10-CM

## 2023-07-12 LAB — BRAIN NATRIURETIC PEPTIDE: B Natriuretic Peptide: 159 pg/mL — ABNORMAL HIGH (ref 0.0–100.0)

## 2023-07-12 MED ORDER — FUROSEMIDE 20 MG PO TABS: 20.0000 mg | ORAL_TABLET | ORAL | 0 refills | Status: DC

## 2023-07-12 NOTE — Patient Instructions (Signed)
 Medication Instructions:   Take Lasix  20 mg daily as needed for weight gain over 3 lbs in 24 hours  Labwork: BNP today  Testing/Procedures: Your physician has requested that you have an echocardiogram. Echocardiography is a painless test that uses sound waves to create images of your heart. It provides your doctor with information about the size and shape of your heart and how well your heart's chambers and valves are working. This procedure takes approximately one hour. There are no restrictions for this procedure. Please do NOT wear cologne, perfume, aftershave, or lotions (deodorant is allowed). Please arrive 15 minutes prior to your appointment time.  Please note: We ask at that you not bring children with you during ultrasound (echo/ vascular) testing. Due to room size and safety concerns, children are not allowed in the ultrasound rooms during exams. Our front office staff cannot provide observation of children in our lobby area while testing is being conducted. An adult accompanying a patient to their appointment will only be allowed in the ultrasound room at the discretion of the ultrasound technician under special circumstances. We apologize for any inconvenience.   Follow-Up: 3 months  Any Other Special Instructions Will Be Listed Below (If Applicable).    Please follow up with your primary care doctor.   We have given you a work note for the next 2 days.  If you need a refill on your cardiac medications before your next appointment, please call your pharmacy.

## 2023-07-12 NOTE — Progress Notes (Signed)
 Cardiology Office Note:    Date:  07/12/2023   ID:  Rachel [name] Husband, DOB [birth year], MRN [id number]  PCP:  Rachel Satterfield, MD  [name] Cardiologist:  Rachel [name] Merck, MD  [facility] Electrophysiologist:  None   Referring MD: Rachel Satterfield, MD   Chief Complaint: sob/cough  History of Present Illness:    Rachel [name] is a 63 y.o. 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  The patient was last seen 03/2023 and was stable from a cardiac perspective. Discussed CRT given CM and LBBB. EP referral was placed.   Today, the patient reports she went to the ER and was given abx and steroids for PNA. She reports shortness of breath, coughing. No fever. She has chest tightness. She has green sputum. Possible COPD, she is previous smoker. She does not use CPAP. No lower leg edema. She reports she has previously been on lasix . She is requesting time off work.   Past Medical History:  Diagnosis Date   Anemia    Anxiety    Arthritis    in both knees   Depression    GERD (gastroesophageal reflux disease)    Hyperlipidemia    Hypertension    Sleep apnea     Past Surgical History:  Procedure Laterality Date   CARPAL TUNNEL RELEASE Right    CATARACT EXTRACTION W/PHACO Right 02/19/2020   Procedure: CATARACT EXTRACTION PHACO AND INTRAOCULAR LENS PLACEMENT (IOC);  Surgeon: Rachel Agent, MD;  Location: [facility];  Service: Ophthalmology;  Laterality: Right;  CDE: 4.83   CESAREAN SECTION     cesarian  1990   COLONOSCOPY N/A 08/10/2016   Procedure: COLONOSCOPY;  Surgeon: Rachel [name] Haddock, MD;  Location: [facility]O [facility];  Service: Endoscopy;  Laterality: N/A;  10:30 AM   fallopian tube removed N/A     LEFT HEART CATH AND CORONARY ANGIOGRAPHY N/A 02/19/2022   Procedure: LEFT HEART CATH AND CORONARY ANGIOGRAPHY;  Surgeon: Rachel Debby DELENA, MD;  [facility] INV[facility] LAB;  Service: Cardiovascular;  Laterality: N/A;   TOTAL KNEE ARTHROPLASTY Left 03/18/2021   Procedure: TOTAL KNEE ARTHROPLASTY;  Surgeon: Rachel Taft BRAVO, MD;  Location: [facility];  Service: Orthopedics;  Laterality: Left;    Current Medications: Current Meds  Medication Sig   albuterol  (PROVENTIL ) (2.5 MG/3ML) 0.083% nebulizer solution Take 2.5 mg by nebulization every 4 (four) hours as needed for wheezing or shortness of breath.   aspirin  EC 81 MG tablet Take by mouth.   atorvastatin  (LIPITOR) 80 MG tablet Take 1 tablet (80 mg total) by mouth daily.   clonazePAM  (KLONOPIN ) 1 MG tablet Take 1 mg by mouth 3 (three) times daily as needed for anxiety.   diclofenac Sodium (VOLTAREN) 1 % GEL Apply 2 g topically 3 (three) times daily as needed (pain).   empagliflozin  (JARDIANCE ) 10 MG TABS tablet Take 1 tablet (10 mg total) by mouth daily before breakfast.   fluticasone  (FLONASE ) 50 MCG/ACT nasal spray Place 1 spray into both nostrils daily.   furosemide  (LASIX ) 20 MG tablet Take 1 tablet (20 mg total) by mouth as directed. Take 20 mg daily as needed for weight gain over 3 lbs in 24 hours   ipratropium (ATROVENT )  0.02 % nebulizer solution Take 0.25 mg by nebulization every 6 (six) hours as needed for wheezing or shortness of breath.   metoprolol  succinate (TOPROL -XL) 100 MG 24 hr tablet Take 1 tablet (100 mg total) by mouth daily. Take with or immediately following a meal.   sacubitril -valsartan  (ENTRESTO ) 49-51 MG Take 1 tablet by mouth 2 (two) times daily.   spironolactone  (ALDACTONE ) 25 MG tablet Take 1 tablet (25 mg total) by mouth daily.   Current Facility-Administered Medications for the 07/12/23 encounter (Office Visit) with Rachel, Ranon Coven H, PA-C  Medication   bupivacaine -meloxicam  ER (ZYNRELEF ) injection 400 mg      Allergies:   Hydrocodone , Oxycodone , Penicillins, Precedex  [dexmedetomidine  hcl in nacl], Seroquel [quetiapine], and Shellfish allergy    Social History   Socioeconomic History   Marital status: Single    Spouse name: Not on file   Number of children: Not on file   Years of education: Not on file   Highest education level: Not on file  Occupational History   Occupation: CNA    Comment: [name]  Tobacco Use   Smoking status: Former    Current packs/day: 0.00    Average packs/day: 1 pack/day for 30.0 years (30.0 ttl pk-yrs)    Types: Cigarettes    Start date: 09/28/1986    Quit date: 09/27/2016    Years since quitting: 6.7   Smokeless tobacco: Never  Vaping Use   Vaping status: Never Used  Substance and Sexual Activity   Alcohol use: Yes    Comment: rarely.last used x 3 days   Drug use: Not Currently    Comment: hx of marijuana use   Sexual activity: Not on file  Other Topics Concern   Not on file  Social History Narrative   Right handed. Single.  3 kids.  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  Caffeine 3 16 oz coffee, 4 sodas   Social Drivers of Corporate Investment Banker Strain: Low Risk  (09/07/2022)   Received from [facility], [facility]   Overall Financial Resource Strain (CARDIA)    Difficulty of Paying Living Expenses: Not hard at all  Food Insecurity: No Food Insecurity (09/07/2022)   Received from [facility], [facility]   Hunger Vital Sign    Worried About Running Out of Food in the Last Year: Never true    Ran Out of Food in the Last Year: Never true  Transportation Needs: No Transportation Needs (09/07/2022)   Received from [facility], [facility]   [facility] - Transportation    Lack of Transportation (Medical): No    Lack of Transportation (Non-Medical): No  Physical Activity: Insufficiently Active (06/07/2018)   Exercise Vital Sign    Days of Exercise per Week: 1 day    Minutes of Exercise per Session: 20 min  Stress: Stress  Concern Present (06/07/2018)   [employer] of Occupational Health - Occupational Stress Questionnaire    Feeling of Stress : To some extent  Social Connections: Somewhat Isolated (06/07/2018)   Social Connection and Isolation Panel [NHANES]    Frequency of Communication with Friends and Family: Three times a week    Frequency of Social Gatherings with Friends and Family: Three times a week    Attends Religious Services: 1 to 4 times per year    Active Member of Clubs or Organizations: No    Attends Banker Meetings: Never    Marital Status: Never married  Family History: The patient's family history includes Cancer in her maternal grandfather and mother; Heart attack in her father; Kidney failure in her maternal grandmother; Sarcoidosis in her sister; Seizures in her brother; Stroke in her paternal grandmother.  ROS:   Please see the history of present illness.     All other systems reviewed and are negative.  EKGs/Labs/Other Studies Reviewed:    The following studies were reviewed today:   Cardiac Catheterization: 01/2022   Prox RCA-1 lesion is 20% stenosed.   Prox RCA-2 lesion is 20% stenosed.   Mid RCA to Dist RCA lesion is 20% stenosed.   Acute Mrg lesion is 60% stenosed.   2nd Mrg lesion is 30% stenosed.   Prox LAD lesion is 5% stenosed.   Mild to moderate multivessel coronary calcification with mild-moderate nonobstructive CAD.   LV EDP 29 mmHg   RECOMMENDATION: Guideline directed medical therapy for HFrEF.  Aggressive lipid-lowering therapy with target LDL less than 70.   cMRI: 01/2023 IMPRESSION: 1. Left ventricular chamber size is normal with moderately reduced LV systolic function, LVEF 35%.   2. Right ventricular chamber size is normal with low normal RV systolic function, RVEF 52%.   3. No delayed myocardial enhancement. No myocardial edema. ECV 31%, nonspecific elevation.   4. Findings in combination suggest nonischemic  cardiomyopathy, secondary to hypertensive heart disease vs. LBBB cardiomyopathy.  EKG:  EKG is ordered today.  The ekg ordered today demonstrates NSR, 1st degree AV block, TWI I, nonspecific ST changes  Recent Labs: 08/28/2022: BUN 17; Creatinine, Ser 0.66; Hemoglobin 12.4; Platelets 418; Potassium 4.4; Sodium 144  Recent Lipid Panel    Component Value Date/Time   TRIG 179 ([name]) 10/21/2016 2039     Physical Exam:    VS:  BP 126/68 (BP Location: Left Arm, Patient Position: Sitting, Cuff Size: Large)   Pulse 74   Ht 5' (1.524 m)   Wt 213 lb (96.6 kg)   SpO2 96%   BMI 41.60 kg/m     Wt Readings from Last 3 Encounters:  07/12/23 213 lb (96.6 kg)  04/28/23 206 lb (93.4 kg)  01/26/23 201 lb (91.2 kg)     GEN:  Well nourished, well developed in no acute distress HEENT: Normal NECK: No JVD; No carotid bruits LYMPHATICS: No lymphadenopathy CARDIAC: RRR, no murmurs, rubs, gallops RESPIRATORY:  +wheezing + rhonchi  ABDOMEN: Soft, non-tender, non-distended MUSCULOSKELETAL:  No edema; No deformity  SKIN: Warm and dry NEUROLOGIC:  Alert and oriented x 3 PSYCHIATRIC:  Normal affect   ASSESSMENT:    1. SOB (shortness of breath)   2. Community acquired pneumonia, unspecified laterality   3. Chronic HFrEF (heart failure with reduced ejection fraction) (HCC)   4. LBBB (left bundle branch block)   5. Coronary artery disease involving native coronary artery of native heart without angina pectoris   6. Essential hypertension   7. Hyperlipidemia, mixed   8. Chronic obstructive pulmonary disease, unspecified COPD type (HCC)   9. OSA (obstructive sleep apnea)    PLAN:    In order of problems listed above:  SOB/CAP She reports coughing with green sputum, sob, chest tightness for 1-2 months. She was seen in the ER 12/30 diagnosed with CAP and given abx, steroids and albuterol . She feels she is not better and is concerned it may be her heart. On exam she has diffuse wheezing and rhonchi.  She appears euvolemic. Low suspicion for cardiac etiology. Recommended PCP follow-up and albuterol  use. I will check a BNP  and give lasix  20mg  to use as needed. I will repeat an eco as it has been a year. I will give 2 days off work.  HFrEF She has known cardiomyopathy with EF 40-45% by echo 01/2022. cMRI showed LVEF 35%. CM felt to be from hypertensive heart disease or LBBB. Patient appears euvolemic as above. Continue Jardiance , Toprol , Entresto , and spironolactone . Repeat echo as above. Patient has been referred to EP to discuss CRT, but patient has not returned calls. We will make an apt while she's here.   CAD Cath in 2023 showed mild to mod nonobstructive CAD recommended medical management. Chest tightness in the setting of CAP with SOB/coughing. Repeat echo as above. Continue ASA, Lipitor and Toprol . If chest tightness continues despite treatment of lung issues, can revisit ischemic testing.  HTN BP is well controlled, continue current medications.   HLD LDL 106 in 02/2023. Can repeat at follow-up. Continue Lipitor 40mg  daily.   COPD/OSA May benefit from pulmonology referral. She reports noncompliance with CPAP.  Disposition: Follow up in 3 month(s) with MD/APP    Signed, Taquita Demby VEAR Fishman, PA-C  07/12/2023 1:29 PM    [facility]

## 2023-07-21 DIAGNOSIS — I10 Essential (primary) hypertension: Secondary | ICD-10-CM | POA: Diagnosis not present

## 2023-07-21 DIAGNOSIS — Z6841 Body Mass Index (BMI) 40.0 and over, adult: Secondary | ICD-10-CM | POA: Diagnosis not present

## 2023-07-21 DIAGNOSIS — J449 Chronic obstructive pulmonary disease, unspecified: Secondary | ICD-10-CM | POA: Diagnosis not present

## 2023-07-21 DIAGNOSIS — F419 Anxiety disorder, unspecified: Secondary | ICD-10-CM | POA: Diagnosis not present

## 2023-07-21 DIAGNOSIS — I5033 Acute on chronic diastolic (congestive) heart failure: Secondary | ICD-10-CM | POA: Diagnosis not present

## 2023-07-22 ENCOUNTER — Other Ambulatory Visit (HOSPITAL_COMMUNITY): Payer: BC Managed Care – PPO

## 2023-07-22 ENCOUNTER — Ambulatory Visit (HOSPITAL_COMMUNITY): Admission: RE | Admit: 2023-07-22 | Payer: BC Managed Care – PPO | Source: Ambulatory Visit

## 2023-08-05 ENCOUNTER — Ambulatory Visit: Payer: BC Managed Care – PPO | Admitting: Internal Medicine

## 2023-08-11 ENCOUNTER — Ambulatory Visit (HOSPITAL_COMMUNITY)
Admission: RE | Admit: 2023-08-11 | Discharge: 2023-08-11 | Disposition: A | Discharge status: Home / Self Care | Payer: BC Managed Care – PPO | Source: Ambulatory Visit | Attending: Medical | Admitting: Medical

## 2023-08-11 DIAGNOSIS — R0602 Shortness of breath: Secondary | ICD-10-CM | POA: Insufficient documentation

## 2023-08-11 LAB — ECHOCARDIOGRAM COMPLETE
Area-P 1/2: 5.13 cm2
S' Lateral: 2.6 cm

## 2023-08-11 NOTE — Progress Notes (Signed)
*  PRELIMINARY RESULTS* Echocardiogram 2D Echocardiogram has been performed.  Stacey Drain 08/11/2023, 1:57 PM

## 2023-08-16 ENCOUNTER — Encounter: Payer: Self-pay | Admitting: Internal Medicine

## 2023-08-16 ENCOUNTER — Ambulatory Visit: Payer: BC Managed Care – PPO | Attending: Internal Medicine | Admitting: Internal Medicine

## 2023-08-16 ENCOUNTER — Other Ambulatory Visit (HOSPITAL_COMMUNITY)
Admission: RE | Admit: 2023-08-16 | Discharge: 2023-08-16 | Disposition: A | Discharge status: Home / Self Care | Payer: BC Managed Care – PPO | Source: Ambulatory Visit | Attending: Internal Medicine | Admitting: Internal Medicine

## 2023-08-16 VITALS — BP 138/70 | HR 79 | Ht 60.0 in | Wt 214.4 lb

## 2023-08-16 DIAGNOSIS — Z01818 Encounter for other preprocedural examination: Secondary | ICD-10-CM | POA: Insufficient documentation

## 2023-08-16 LAB — BASIC METABOLIC PANEL
Anion gap: 11 (ref 5–15)
BUN: 15 mg/dL (ref 8–23)
CO2: 26 mmol/L (ref 22–32)
Calcium: 9.1 mg/dL (ref 8.9–10.3)
Chloride: 102 mmol/L (ref 98–111)
Creatinine, Ser: 0.64 mg/dL (ref 0.44–1.00)
GFR, Estimated: >60 mL/min (ref >60)
Glucose, Bld: 106 mg/dL — ABNORMAL HIGH (ref 70–99)
Potassium: 3.4 mmol/L — ABNORMAL LOW (ref 3.5–5.1)
Sodium: 139 mmol/L (ref 135–145)

## 2023-08-16 LAB — CBC
HCT: 40.5 % (ref 36.0–46.0)
Hemoglobin: 12.9 g/dL (ref 12.0–15.0)
MCH: 28.9 pg (ref 26.0–34.0)
MCHC: 31.9 g/dL (ref 30.0–36.0)
MCV: 90.6 fL (ref 80.0–100.0)
Platelets: 332 10*3/uL (ref 150–400)
RBC: 4.47 MIL/uL (ref 3.87–5.11)
RDW: 14.5 % (ref 11.5–15.5)
WBC: 5.4 10*3/uL (ref 4.0–10.5)
nRBC: 0 % (ref 0.0–0.2)

## 2023-08-16 MED ORDER — EMPAGLIFLOZIN 10 MG PO TABS: 10.0000 mg | ORAL_TABLET | Freq: Every day | ORAL | 5 refills | Status: DC

## 2023-08-16 NOTE — H&P (View-Only) (Signed)
 HPI Rachel [name] is referred for evaluation and consideration for a biv device. She is a pleasant 63 yo woman with an EF of 35% by CMR despite guideline directed medical therapy. She has Class 3 symptoms and a LBBB  QRS of 140 ms despite max medical therapy. Her bp in the office is 138 but at other times has been low limiting uptitration of her meds.  Allergies  Allergen Reactions   Hydrocodone Nausea And Vomiting   Oxycodone Nausea And Vomiting    Codeine, hydrocodone, most pain pills.   Penicillins Itching, Nausea And Vomiting and Swelling    Has patient had a PCN reaction causing immediate rash, facial/tongue/throat swelling, SOB or lightheadedness with hypotension: Yes, around the eyes Has patient had a PCN reaction causing severe rash involving mucus membranes or skin necrosis: No Has patient had a PCN reaction that required hospitalization: No Has patient had a PCN reaction occurring within the last 10 years: No  If all of the above answers are "NO", then may proceed with Cephalosporin use.    Precedex [Dexmedetomidine Hcl In Nacl] Other (See Comments)    Increased agitation/confusion in ICU [April 2018]   Seroquel [Quetiapine] Other (See Comments)    More agitated/confused per daughter Karel Jarvis when given in the past.   Shellfish Allergy Itching     Current Outpatient Medications  Medication Sig Dispense Refill   albuterol (PROVENTIL) (2.5 MG/3ML) 0.083% nebulizer solution Take 2.5 mg by nebulization every 4 (four) hours as needed for wheezing or shortness of breath.     aspirin EC 81 MG tablet Take by mouth.     atorvastatin (LIPITOR) 80 MG tablet Take 1 tablet (80 mg total) by mouth daily. 90 tablet 3   clonazePAM (KLONOPIN) 1 MG tablet Take 1 mg by mouth 3 (three) times daily as needed for anxiety.     diclofenac Sodium (VOLTAREN) 1 % GEL Apply 2 g topically 3 (three) times daily as needed (pain).     empagliflozin (JARDIANCE) 10 MG TABS tablet Take 1 tablet (10 mg  total) by mouth daily before breakfast. 30 tablet 5   fluticasone (FLONASE) 50 MCG/ACT nasal spray Place 1 spray into both nostrils daily.     furosemide (LASIX) 20 MG tablet Take 1 tablet (20 mg total) by mouth as directed. Take 20 mg daily as needed for weight gain over 3 lbs in 24 hours 90 tablet 0   ipratropium (ATROVENT) 0.02 % nebulizer solution Take 0.25 mg by nebulization every 6 (six) hours as needed for wheezing or shortness of breath.     metoprolol succinate (TOPROL-XL) 100 MG 24 hr tablet Take 1 tablet (100 mg total) by mouth daily. Take with or immediately following a meal. 90 tablet 3   sacubitril-valsartan (ENTRESTO) 49-51 MG Take 1 tablet by mouth 2 (two) times daily. 60 tablet 11   spironolactone (ALDACTONE) 25 MG tablet Take 1 tablet (25 mg total) by mouth daily. 30 tablet 0   Current Facility-Administered Medications  Medication Dose Route Frequency Provider Last Rate Last Admin   bupivacaine-meloxicam ER (ZYNRELEF) injection 400 mg  400 mg Infiltration Once Vickki Hearing, MD         Past Medical History:  Diagnosis Date   Anemia    Anxiety    Arthritis    in both knees   Depression    GERD (gastroesophageal reflux disease)    Hyperlipidemia    Hypertension    Sleep apnea  ROS:   All systems reviewed and negative except as noted in the HPI.   Past Surgical History:  Procedure Laterality Date   CARPAL TUNNEL RELEASE Right    CATARACT EXTRACTION W/PHACO Right 02/19/2020   Procedure: CATARACT EXTRACTION PHACO AND INTRAOCULAR LENS PLACEMENT (IOC);  Surgeon: Fabio Pierce, MD;  Location: [facility];  Service: Ophthalmology;  Laterality: Right;  CDE: 4.83   CESAREAN SECTION     cesarian  1990   COLONOSCOPY N/A 08/10/2016   Procedure: COLONOSCOPY;  Surgeon: West Bali, MD;  Location: [facility];  Service: Endoscopy;  Laterality: N/A;  10:30 AM   fallopian tube removed N/A    LEFT HEART CATH AND CORONARY ANGIOGRAPHY N/A 02/19/2022   Procedure: LEFT  HEART CATH AND CORONARY ANGIOGRAPHY;  Surgeon: Lennette Bihari, MD;  [facility]: [facility];  Service: Cardiovascular;  Laterality: N/A;   TOTAL KNEE ARTHROPLASTY Left 03/18/2021   Procedure: TOTAL KNEE ARTHROPLASTY;  Surgeon: Vickki Hearing, MD;  Location: [facility];  Service: Orthopedics;  Laterality: Left;     Family History  Problem Relation Age of Onset   Cancer Mother        lung   Heart attack Father    Stroke Paternal Grandmother    Kidney failure Maternal Grandmother    Cancer Maternal Grandfather        lung   Seizures Brother    Sarcoidosis Sister      Social History   Socioeconomic History   Marital status: Single    Spouse name: Not on file   Number of children: Not on file   Years of education: Not on file   Highest education level: Not on file  Occupational History   Occupation: CNA    Comment: [employer]  Tobacco Use   Smoking status: Former    Current packs/day: 0.00    Average packs/day: 1 pack/day for 30.0 years (30.0 ttl pk-yrs)    Types: Cigarettes    Start date: 09/28/1986    Quit date: 09/27/2016    Years since quitting: 6.8   Smokeless tobacco: Never  Vaping Use   Vaping status: Never Used  Substance and Sexual Activity   Alcohol use: Yes    Comment: rarely.last used x 3 days   Drug use: Not Currently    Comment: hx of marijuana use   Sexual activity: Not on file  Other Topics Concern   Not on file  Social History Narrative   Right handed. Single.  3 kids.  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  Caffeine 3 16 oz coffee, 4 sodas   Social Drivers of Corporate investment banker Strain: Low Risk  (09/07/2022)   Received from [facility], [facility]   Overall Financial Resource Strain (CARDIA)    Difficulty of Paying Living Expenses: Not hard at all  Food Insecurity: No Food Insecurity (09/07/2022)   Received from [facility], [facility]   Hunger Vital Sign    Worried About Running Out of Food in the Last Year: Never  true    Ran Out of Food in the Last Year: Never true  Transportation Needs: No Transportation Needs (09/07/2022)   Received from [facility], [facility]   [facility] - Transportation    Lack of Transportation (Medical): No    Lack of Transportation (Non-Medical): No  Physical Activity: Insufficiently Active (06/07/2018)   Exercise Vital Sign    Days of Exercise per Week:  1 day    Minutes of Exercise per Session: 20 min  Stress: Stress Concern Present (06/07/2018)   [employer] of Occupational Health - Occupational Stress Questionnaire    Feeling of Stress : To some extent  Social Connections: Somewhat Isolated (06/07/2018)   Social Connection and Isolation Panel [NHANES]    Frequency of Communication with Friends and Family: Three times a week    Frequency of Social Gatherings with Friends and Family: Three times a week    Attends Religious Services: 1 to 4 times per year    Active Member of Clubs or Organizations: No    Attends Banker Meetings: Never    Marital Status: Never married  Intimate Partner Violence: Not At Risk (06/07/2018)   Humiliation, Afraid, Rape, and Kick questionnaire    Fear of Current or Ex-Partner: No    Emotionally Abused: No    Physically Abused: No    Sexually Abused: No     BP 138/70   Pulse 79   Ht 5' (1.524 m)   Wt 214 lb 6.4 oz (97.3 kg)   SpO2 95%   BMI 41.87 kg/m   Physical Exam:  Well appearing NAD HEENT: Unremarkable Neck:  No JVD, no thyromegally Lymphatics:  No adenopathy Back:  No CVA tenderness Lungs:  Clear with no wheezes HEART:  Regular rate rhythm, no murmurs, no rubs, no clicks/ split S2.  Abd:  soft, positive bowel sounds, no organomegally, no rebound, no guarding Ext:  2 plus pulses, no edema, no cyanosis, no clubbing Skin:  No rashes no nodules Neuro:  CN II through XII intact, motor grossly intact  Assess/Plan: Chronic class 3 CHF - despite GDMT she remains symptomatic. Her EF is 35% by CMRI  and maximal GDMT for over 6 months. I have recommended biv ICD insertion.  Obesity - she will need to work on this. Her BMI is 42.   Sharlot Gowda Makani Seckman,MD

## 2023-08-16 NOTE — Patient Instructions (Addendum)
 Medication Instructions:   Your physician recommends that you continue on your current medications as directed. Please refer to the Current Medication list given to you today.  Labwork: Today CBC,BMET at [facility]  Testing/Procedures: Your physician has recommended that you have a defibrillator inserted. An implantable cardioverter defibrillator (ICD) is a small device that is placed in your chest or, in rare cases, your abdomen. This device uses electrical pulses or shocks to help control life-threatening, irregular heartbeats that could lead the heart to suddenly stop beating (sudden cardiac arrest). Leads are attached to the ICD that goes into your heart. This is done in the hospital and usually requires an overnight stay. Please see the instruction sheet given to you today for more information.   Follow-Up: Wound check in 14 days after insertion, 91 day follow up after procedure with Dr.Taylor  Any Other Special Instructions Will Be Listed Below (If Applicable).  If you need a refill on your cardiac medications before your next appointment, please call your pharmacy.

## 2023-08-16 NOTE — H&P (View-Only) (Signed)
 HPI Ms. Rachel [name] is referred for evaluation and consideration for a biv device. She is a pleasant 63 yo woman with an EF of 35% by CMR despite guideline directed medical therapy. She has Class 3 symptoms and a LBBB  QRS of 140 ms despite max medical therapy. Her bp in the office is 138 but at other times has been low limiting uptitration of her meds.  Allergies  Allergen Reactions   Hydrocodone Nausea And Vomiting   Oxycodone Nausea And Vomiting    Codeine, hydrocodone, most pain pills.   Penicillins Itching, Nausea And Vomiting and Swelling    Has patient had a PCN reaction causing immediate rash, facial/tongue/throat swelling, SOB or lightheadedness with hypotension: Yes, around the eyes Has patient had a PCN reaction causing severe rash involving mucus membranes or skin necrosis: No Has patient had a PCN reaction that required hospitalization: No Has patient had a PCN reaction occurring within the last 10 years: No  If all of the above answers are "NO", then may proceed with Cephalosporin use.    Precedex [Dexmedetomidine Hcl In Nacl] Other (See Comments)    Increased agitation/confusion in ICU [April 2018]   Seroquel [Quetiapine] Other (See Comments)    More agitated/confused per daughter Rachel [name] when given in the past.   Shellfish Allergy Itching     Current Outpatient Medications  Medication Sig Dispense Refill   albuterol (PROVENTIL) (2.5 MG/3ML) 0.083% nebulizer solution Take 2.5 mg by nebulization every 4 (four) hours as needed for wheezing or shortness of breath.     aspirin EC 81 MG tablet Take by mouth.     atorvastatin (LIPITOR) 80 MG tablet Take 1 tablet (80 mg total) by mouth daily. 90 tablet 3   clonazePAM (KLONOPIN) 1 MG tablet Take 1 mg by mouth 3 (three) times daily as needed for anxiety.     diclofenac Sodium (VOLTAREN) 1 % GEL Apply 2 g topically 3 (three) times daily as needed (pain).     empagliflozin (JARDIANCE) 10 MG TABS tablet Take 1 tablet (10 mg  total) by mouth daily before breakfast. 30 tablet 5   fluticasone (FLONASE) 50 MCG/ACT nasal spray Place 1 spray into both nostrils daily.     furosemide (LASIX) 20 MG tablet Take 1 tablet (20 mg total) by mouth as directed. Take 20 mg daily as needed for weight gain over 3 lbs in 24 hours 90 tablet 0   ipratropium (ATROVENT) 0.02 % nebulizer solution Take 0.25 mg by nebulization every 6 (six) hours as needed for wheezing or shortness of breath.     metoprolol succinate (TOPROL-XL) 100 MG 24 hr tablet Take 1 tablet (100 mg total) by mouth daily. Take with or immediately following a meal. 90 tablet 3   sacubitril-valsartan (ENTRESTO) 49-51 MG Take 1 tablet by mouth 2 (two) times daily. 60 tablet 11   spironolactone (ALDACTONE) 25 MG tablet Take 1 tablet (25 mg total) by mouth daily. 30 tablet 0   Current Facility-Administered Medications  Medication Dose Route Frequency Provider Last Rate Last Admin   bupivacaine-meloxicam ER (ZYNRELEF) injection 400 mg  400 mg Infiltration Once Vickki Hearing, MD         Past Medical History:  Diagnosis Date   Anemia    Anxiety    Arthritis    in both knees   Depression    GERD (gastroesophageal reflux disease)    Hyperlipidemia    Hypertension    Sleep apnea  ROS:   All systems reviewed and negative except as noted in the HPI.   Past Surgical History:  Procedure Laterality Date   CARPAL TUNNEL RELEASE Right    CATARACT EXTRACTION W/PHACO Right 02/19/2020   Procedure: CATARACT EXTRACTION PHACO AND INTRAOCULAR LENS PLACEMENT (IOC);  Surgeon: Fabio Pierce, MD;  Location: [facility];  Service: Ophthalmology;  Laterality: Right;  CDE: 4.83   CESAREAN SECTION     cesarian  1990   COLONOSCOPY N/A 08/10/2016   Procedure: COLONOSCOPY;  Surgeon: West Bali, MD;  Location: [facility];  Service: Endoscopy;  Laterality: N/A;  10:30 AM   fallopian tube removed N/A    LEFT HEART CATH AND CORONARY ANGIOGRAPHY N/A 02/19/2022   Procedure: LEFT  HEART CATH AND CORONARY ANGIOGRAPHY;  Surgeon: Lennette Bihari, MD;  [facility];  Service: Cardiovascular;  Laterality: N/A;   TOTAL KNEE ARTHROPLASTY Left 03/18/2021   Procedure: TOTAL KNEE ARTHROPLASTY;  Surgeon: Vickki Hearing, MD;  Location: [facility];  Service: Orthopedics;  Laterality: Left;     Family History  Problem Relation Age of Onset   Cancer Mother        lung   Heart attack Father    Stroke Paternal Grandmother    Kidney failure Maternal Grandmother    Cancer Maternal Grandfather        lung   Seizures Brother    Sarcoidosis Sister      Social History   Socioeconomic History   Marital status: Single    Spouse name: Not on file   Number of children: Not on file   Years of education: Not on file   Highest education level: Not on file  Occupational History   Occupation: CNA    Comment: [employer]  Tobacco Use   Smoking status: Former    Current packs/day: 0.00    Average packs/day: 1 pack/day for 30.0 years (30.0 ttl pk-yrs)    Types: Cigarettes    Start date: 09/28/1986    Quit date: 09/27/2016    Years since quitting: 6.8   Smokeless tobacco: Never  Vaping Use   Vaping status: Never Used  Substance and Sexual Activity   Alcohol use: Yes    Comment: rarely.last used x 3 days   Drug use: Not Currently    Comment: hx of marijuana use   Sexual activity: Not on file  Other Topics Concern   Not on file  Social History Narrative   Right handed. Single.  3 kids.  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  Caffeine 3 16 oz coffee, 4 sodas   Social Drivers of Corporate investment banker Strain: Low Risk  (09/07/2022)   Received from [facility]   Overall Financial Resource Strain (CARDIA)    Difficulty of Paying Living Expenses: Not hard at all  Food Insecurity: No Food Insecurity (09/07/2022)   Received [facility]   Hunger Vital Sign    Worried About Running Out of Food in the Last Year: Never  true    Ran Out of Food in the Last Year: Never true  Transportation Needs: No Transportation Needs (09/07/2022)   Received from [facility]   [facility] - Transportation    Lack of Transportation (Medical): No    Lack of Transportation (Non-Medical): No  Physical Activity: Insufficiently Active (06/07/2018)   Exercise Vital Sign    Days of Exercise per Week:  1 day    Minutes of Exercise per Session: 20 min  Stress: Stress Concern Present (06/07/2018)   [employer] of Occupational Health - Occupational Stress Questionnaire    Feeling of Stress : To some extent  Social Connections: Somewhat Isolated (06/07/2018)   Social Connection and Isolation Panel [NHANES]    Frequency of Communication with Friends and Family: Three times a week    Frequency of Social Gatherings with Friends and Family: Three times a week    Attends Religious Services: 1 to 4 times per year    Active Member of Clubs or Organizations: No    Attends Banker Meetings: Never    Marital Status: Never married  Intimate Partner Violence: Not At Risk (06/07/2018)   Humiliation, Afraid, Rape, and Kick questionnaire    Fear of Current or Ex-Partner: No    Emotionally Abused: No    Physically Abused: No    Sexually Abused: No     BP 138/70   Pulse 79   Ht 5' (1.524 m)   Wt 214 lb 6.4 oz (97.3 kg)   SpO2 95%   BMI 41.87 kg/m   Physical Exam:  Well appearing NAD HEENT: Unremarkable Neck:  No JVD, no thyromegally Lymphatics:  No adenopathy Back:  No CVA tenderness Lungs:  Clear with no wheezes HEART:  Regular rate rhythm, no murmurs, no rubs, no clicks/ split S2.  Abd:  soft, positive bowel sounds, no organomegally, no rebound, no guarding Ext:  2 plus pulses, no edema, no cyanosis, no clubbing Skin:  No rashes no nodules Neuro:  CN II through XII intact, motor grossly intact  Assess/Plan: Chronic class 3 CHF - despite GDMT she remains symptomatic. Her EF is 35% by CMRI  and maximal GDMT for over 6 months. I have recommended biv ICD insertion.  Obesity - she will need to work on this. Her BMI is 42.   Rachel Gowda Makani Seckman,MD

## 2023-08-16 NOTE — Progress Notes (Signed)
 HPI Rachel [name] is referred for evaluation and consideration for a biv device. She is a pleasant 63 yo woman with an EF of 35% by CMR despite guideline directed medical therapy. She has Class 3 symptoms and a LBBB  QRS of 140 ms despite max medical therapy. Her bp in the office is 138 but at other times has been low limiting uptitration of her meds.  Allergies  Allergen Reactions   Hydrocodone Nausea And Vomiting   Oxycodone Nausea And Vomiting    Codeine, hydrocodone, most pain pills.   Penicillins Itching, Nausea And Vomiting and Swelling    Has patient had a PCN reaction causing immediate rash, facial/tongue/throat swelling, SOB or lightheadedness with hypotension: Yes, around the eyes Has patient had a PCN reaction causing severe rash involving mucus membranes or skin necrosis: No Has patient had a PCN reaction that required hospitalization: No Has patient had a PCN reaction occurring within the last 10 years: No  If all of the above answers are "NO", then may proceed with Cephalosporin use.    Precedex [Dexmedetomidine Hcl In Nacl] Other (See Comments)    Increased agitation/confusion in ICU [April 2018]   Seroquel [Quetiapine] Other (See Comments)    More agitated/confused per daughter Karel Jarvis when given in the past.   Shellfish Allergy Itching     Current Outpatient Medications  Medication Sig Dispense Refill   albuterol (PROVENTIL) (2.5 MG/3ML) 0.083% nebulizer solution Take 2.5 mg by nebulization every 4 (four) hours as needed for wheezing or shortness of breath.     aspirin EC 81 MG tablet Take by mouth.     atorvastatin (LIPITOR) 80 MG tablet Take 1 tablet (80 mg total) by mouth daily. 90 tablet 3   clonazePAM (KLONOPIN) 1 MG tablet Take 1 mg by mouth 3 (three) times daily as needed for anxiety.     diclofenac Sodium (VOLTAREN) 1 % GEL Apply 2 g topically 3 (three) times daily as needed (pain).     empagliflozin (JARDIANCE) 10 MG TABS tablet Take 1 tablet (10 mg  total) by mouth daily before breakfast. 30 tablet 5   fluticasone (FLONASE) 50 MCG/ACT nasal spray Place 1 spray into both nostrils daily.     furosemide (LASIX) 20 MG tablet Take 1 tablet (20 mg total) by mouth as directed. Take 20 mg daily as needed for weight gain over 3 lbs in 24 hours 90 tablet 0   ipratropium (ATROVENT) 0.02 % nebulizer solution Take 0.25 mg by nebulization every 6 (six) hours as needed for wheezing or shortness of breath.     metoprolol succinate (TOPROL-XL) 100 MG 24 hr tablet Take 1 tablet (100 mg total) by mouth daily. Take with or immediately following a meal. 90 tablet 3   sacubitril-valsartan (ENTRESTO) 49-51 MG Take 1 tablet by mouth 2 (two) times daily. 60 tablet 11   spironolactone (ALDACTONE) 25 MG tablet Take 1 tablet (25 mg total) by mouth daily. 30 tablet 0   Current Facility-Administered Medications  Medication Dose Route Frequency Provider Last Rate Last Admin   bupivacaine-meloxicam ER (ZYNRELEF) injection 400 mg  400 mg Infiltration Once Vickki Hearing, MD         Past Medical History:  Diagnosis Date   Anemia    Anxiety    Arthritis    in both knees   Depression    GERD (gastroesophageal reflux disease)    Hyperlipidemia    Hypertension    Sleep apnea  ROS:   All systems reviewed and negative except as noted in the HPI.   Past Surgical History:  Procedure Laterality Date   CARPAL TUNNEL RELEASE Right    CATARACT EXTRACTION W/PHACO Right 02/19/2020   Procedure: CATARACT EXTRACTION PHACO AND INTRAOCULAR LENS PLACEMENT (IOC);  Surgeon: Fabio Pierce, MD;  Location: [facility];  Service: Ophthalmology;  Laterality: Right;  CDE: 4.83   CESAREAN SECTION     cesarian  1990   COLONOSCOPY N/A 08/10/2016   Procedure: COLONOSCOPY;  Surgeon: West Bali, MD;  [facility];  Service: Endoscopy;  Laterality: N/A;  10:30 AM   fallopian tube removed N/A    LEFT HEART CATH AND CORONARY ANGIOGRAPHY N/A 02/19/2022   Procedure: LEFT  HEART CATH AND CORONARY ANGIOGRAPHY;  Surgeon: Lennette Bihari, MD;  [facility]: [facility];  Service: Cardiovascular;  Laterality: N/A;   TOTAL KNEE ARTHROPLASTY Left 03/18/2021   Procedure: TOTAL KNEE ARTHROPLASTY;  Surgeon: Vickki Hearing, MD;  Location: [facility];  Service: Orthopedics;  Laterality: Left;     Family History  Problem Relation Age of Onset   Cancer Mother        lung   Heart attack Father    Stroke Paternal Grandmother    Kidney failure Maternal Grandmother    Cancer Maternal Grandfather        lung   Seizures Brother    Sarcoidosis Sister      Social History   Socioeconomic History   Marital status: Single    Spouse name: Not on file   Number of children: Not on file   Years of education: Not on file   Highest education level: Not on file  Occupational History   Occupation: CNA    Comment: [employer]  Tobacco Use   Smoking status: Former    Current packs/day: 0.00    Average packs/day: 1 pack/day for 30.0 years (30.0 ttl pk-yrs)    Types: Cigarettes    Start date: 09/28/1986    Quit date: 09/27/2016    Years since quitting: 6.8   Smokeless tobacco: Never  Vaping Use   Vaping status: Never Used  Substance and Sexual Activity   Alcohol use: Yes    Comment: rarely.last used x 3 days   Drug use: Not Currently    Comment: hx of marijuana use   Sexual activity: Not on file  Other Topics Concern   Not on file  Social History Narrative   Right handed. Single.  3 kids.  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  Caffeine 3 16 oz coffee, 4 sodas   Social Drivers of Corporate investment banker Strain: Low Risk  (09/07/2022)   Received from [facility]   Overall Financial Resource Strain (CARDIA)    Difficulty of Paying Living Expenses: Not hard at all  Food Insecurity: No Food Insecurity (09/07/2022)   Received from [facility]   Hunger Vital Sign    Worried About Running Out of Food in the Last Year: Never  true    Ran Out of Food in the Last Year: Never true  Transportation Needs: No Transportation Needs (09/07/2022)   Received from [facility]   [facility] - Transportation    Lack of Transportation (Medical): No    Lack of Transportation (Non-Medical): No  Physical Activity: Insufficiently Active (06/07/2018)   Exercise Vital Sign    Days of Exercise per Week:  1 day    Minutes of Exercise per Session: 20 min  Stress: Stress Concern Present (06/07/2018)   [employer] of Occupational Health - Occupational Stress Questionnaire    Feeling of Stress : To some extent  Social Connections: Somewhat Isolated (06/07/2018)   Social Connection and Isolation Panel [NHANES]    Frequency of Communication with Friends and Family: Three times a week    Frequency of Social Gatherings with Friends and Family: Three times a week    Attends Religious Services: 1 to 4 times per year    Active Member of Clubs or Organizations: No    Attends Banker Meetings: Never    Marital Status: Never married  Intimate Partner Violence: Not At Risk (06/07/2018)   Humiliation, Afraid, Rape, and Kick questionnaire    Fear of Current or Ex-Partner: No    Emotionally Abused: No    Physically Abused: No    Sexually Abused: No     BP 138/70   Pulse 79   Ht 5' (1.524 m)   Wt 214 lb 6.4 oz (97.3 kg)   SpO2 95%   BMI 41.87 kg/m   Physical Exam:  Well appearing NAD HEENT: Unremarkable Neck:  No JVD, no thyromegally Lymphatics:  No adenopathy Back:  No CVA tenderness Lungs:  Clear with no wheezes HEART:  Regular rate rhythm, no murmurs, no rubs, no clicks/ split S2.  Abd:  soft, positive bowel sounds, no organomegally, no rebound, no guarding Ext:  2 plus pulses, no edema, no cyanosis, no clubbing Skin:  No rashes no nodules Neuro:  CN II through XII intact, motor grossly intact  Assess/Plan: Chronic class 3 CHF - despite GDMT she remains symptomatic. Her EF is 35% by CMRI  and maximal GDMT for over 6 months. I have recommended biv ICD insertion.  Obesity - she will need to work on this. Her BMI is 42.   Sharlot Gowda Makani Seckman,MD

## 2023-08-24 ENCOUNTER — Telehealth (HOSPITAL_COMMUNITY): Payer: Self-pay

## 2023-08-24 NOTE — Telephone Encounter (Signed)
 Attempted to reach patient to discuss upcoming procedure, no answer. Left VM for patient to return call.

## 2023-08-25 ENCOUNTER — Other Ambulatory Visit: Payer: Self-pay

## 2023-08-25 DIAGNOSIS — Z79899 Other long term (current) drug therapy: Secondary | ICD-10-CM

## 2023-08-25 MED ORDER — POTASSIUM CHLORIDE CRYS ER 20 MEQ PO TBCR: 40.0000 meq | EXTENDED_RELEASE_TABLET | Freq: Every day | ORAL | 3 refills | Status: DC

## 2023-08-25 MED ORDER — FUROSEMIDE 40 MG PO TABS: 40.0000 mg | ORAL_TABLET | Freq: Every day | ORAL | 3 refills | Status: DC

## 2023-08-25 NOTE — Telephone Encounter (Signed)
 Rachel Montana, PA-C to [name] Triage      08/25/23 12:55 PM Result Note Per Dr. Ladona Ridgel continue Lasix at 40mg  daily and add KCl daily. Please get BMET day of her procedure 09/01/23 under his name. Thank you!     Left message to return call.  Rx of lasix 40 mg daily and potassium 40 meq daily sent to [facility], bmet order placed.

## 2023-08-27 ENCOUNTER — Other Ambulatory Visit: Payer: Self-pay

## 2023-08-27 DIAGNOSIS — Z01818 Encounter for other preprocedural examination: Secondary | ICD-10-CM

## 2023-08-30 ENCOUNTER — Telehealth: Payer: Self-pay | Admitting: Internal Medicine

## 2023-08-30 NOTE — Telephone Encounter (Signed)
 Left a message for patient to call office back regarding instruction letter.

## 2023-08-30 NOTE — Telephone Encounter (Signed)
 New Message:        Patient would like to know if she can pick up her instructions for her procedure today or tomorrow, please?

## 2023-08-31 NOTE — Pre-Procedure Instructions (Signed)
Instructed patient on the following items: Arrival time 0615 Nothing to eat or drink after midnight No meds AM of procedure Responsible person to drive you home and stay with you for 24 hrs Wash with special soap night before and morning of procedure

## 2023-09-01 ENCOUNTER — Other Ambulatory Visit: Payer: Self-pay

## 2023-09-01 ENCOUNTER — Encounter (HOSPITAL_COMMUNITY)
Admission: RE | Disposition: A | Discharge status: Home / Self Care | Payer: Self-pay | Source: Home / Self Care | Attending: Internal Medicine

## 2023-09-01 ENCOUNTER — Ambulatory Visit (HOSPITAL_COMMUNITY)
Admission: RE | Admit: 2023-09-01 | Discharge: 2023-09-01 | Disposition: A | Discharge status: Home / Self Care | Payer: BC Managed Care – PPO | Attending: Internal Medicine | Admitting: Internal Medicine

## 2023-09-01 DIAGNOSIS — I509 Heart failure, unspecified: Secondary | ICD-10-CM | POA: Insufficient documentation

## 2023-09-01 DIAGNOSIS — Z538 Procedure and treatment not carried out for other reasons: Secondary | ICD-10-CM | POA: Diagnosis not present

## 2023-09-01 LAB — BASIC METABOLIC PANEL
Anion gap: 7 (ref 5–15)
BUN: 11 mg/dL (ref 8–23)
CO2: 28 mmol/L (ref 22–32)
Calcium: 9.3 mg/dL (ref 8.9–10.3)
Chloride: 105 mmol/L (ref 98–111)
Creatinine, Ser: 0.74 mg/dL (ref 0.44–1.00)
GFR, Estimated: >60 mL/min (ref >60)
Glucose, Bld: 115 mg/dL — ABNORMAL HIGH (ref 70–99)
Potassium: 3.8 mmol/L (ref 3.5–5.1)
Sodium: 140 mmol/L (ref 135–145)

## 2023-09-01 SURGERY — BIV ICD INSERTION CRT-D

## 2023-09-01 MED ORDER — SODIUM CHLORIDE 0.9 % IV SOLN: INTRAVENOUS | Status: DC

## 2023-09-01 MED ORDER — CHLORHEXIDINE GLUCONATE 4 % EX SOLN: 4.0000 | Freq: Once | CUTANEOUS | Status: DC

## 2023-09-01 MED ORDER — SODIUM CHLORIDE 0.9 % IV SOLN: 80.0000 mg | INTRAVENOUS | Status: DC

## 2023-09-01 MED ORDER — POVIDONE-IODINE 10 % EX SWAB
2.0000 | Freq: Once | CUTANEOUS | Status: AC
  Administered 2023-09-01: 2 via TOPICAL

## 2023-09-01 MED ORDER — CEFAZOLIN SODIUM-DEXTROSE 2-4 GM/100ML-% IV SOLN: 2.0000 g | INTRAVENOUS | Status: DC

## 2023-09-01 NOTE — Telephone Encounter (Signed)
 Procedure completed 3/5 at [facility].

## 2023-09-01 NOTE — Interval H&P Note (Signed)
 History and Physical Interval Note:  09/01/2023 7:47 AM  Rachel [name]  has presented today for surgery, with the diagnosis of heart failure.  The various methods of treatment have been discussed with the patient and family. After consideration of risks, benefits and other options for treatment, the patient has consented to  Procedure(s): BIV ICD INSERTION CRT-D (N/A) as a surgical intervention.  The patient's history has been reviewed, patient examined, no change in status, stable for surgery.  I have reviewed the patient's chart and labs.  Questions were answered to the patient's satisfaction.     Lewayne Bunting

## 2023-09-02 ENCOUNTER — Telehealth: Payer: Self-pay

## 2023-09-02 NOTE — Telephone Encounter (Signed)
 Pt's BiV-ICD Implant with Dr. Ladona Ridgel has been moved from 3/5 to 3/12 at 8:30 am due to the lab being down. Pt is aware of new date/time and does not need updated instruction letter.  I went over medication instructions with her.   She has no questions or concerns.

## 2023-09-07 NOTE — Pre-Procedure Instructions (Signed)
 Attempted to call patient regarding procedure instructions for tomorrow.  Left voicemail on the following items: Arrival time 0615 Nothing to eat or drink after midnight No meds AM of procedure Responsible person to drive you home and stay with you for 24 hrs Wash with special soap night before and morning of procedure

## 2023-09-08 ENCOUNTER — Encounter (HOSPITAL_COMMUNITY)
Admission: RE | Disposition: A | Discharge status: Home / Self Care | Payer: Self-pay | Source: Home / Self Care | Attending: Internal Medicine

## 2023-09-08 ENCOUNTER — Ambulatory Visit (HOSPITAL_COMMUNITY)

## 2023-09-08 ENCOUNTER — Other Ambulatory Visit: Payer: Self-pay

## 2023-09-08 ENCOUNTER — Ambulatory Visit (HOSPITAL_COMMUNITY)
Admission: RE | Admit: 2023-09-08 | Discharge: 2023-09-08 | Disposition: A | Discharge status: Home / Self Care | Attending: Internal Medicine | Admitting: Internal Medicine

## 2023-09-08 DIAGNOSIS — Z87891 Personal history of nicotine dependence: Secondary | ICD-10-CM | POA: Diagnosis not present

## 2023-09-08 DIAGNOSIS — I5022 Chronic systolic (congestive) heart failure: Secondary | ICD-10-CM | POA: Diagnosis not present

## 2023-09-08 DIAGNOSIS — Z6841 Body Mass Index (BMI) 40.0 and over, adult: Secondary | ICD-10-CM | POA: Insufficient documentation

## 2023-09-08 DIAGNOSIS — Z79899 Other long term (current) drug therapy: Secondary | ICD-10-CM | POA: Diagnosis not present

## 2023-09-08 DIAGNOSIS — I447 Left bundle-branch block, unspecified: Secondary | ICD-10-CM | POA: Insufficient documentation

## 2023-09-08 DIAGNOSIS — E669 Obesity, unspecified: Secondary | ICD-10-CM | POA: Diagnosis not present

## 2023-09-08 DIAGNOSIS — I110 Hypertensive heart disease with heart failure: Secondary | ICD-10-CM | POA: Insufficient documentation

## 2023-09-08 DIAGNOSIS — I10 Essential (primary) hypertension: Secondary | ICD-10-CM | POA: Diagnosis not present

## 2023-09-08 DIAGNOSIS — J9811 Atelectasis: Secondary | ICD-10-CM | POA: Diagnosis not present

## 2023-09-08 DIAGNOSIS — Z95810 Presence of automatic (implantable) cardiac defibrillator: Secondary | ICD-10-CM | POA: Diagnosis not present

## 2023-09-08 HISTORY — PX: BIV ICD INSERTION CRT-D: EP1195

## 2023-09-08 SURGERY — BIV ICD INSERTION CRT-D

## 2023-09-08 MED ORDER — EMPAGLIFLOZIN 10 MG PO TABS: 10.0000 mg | ORAL_TABLET | Freq: Every day | ORAL | Status: DC

## 2023-09-08 MED ORDER — SODIUM CHLORIDE 0.9 % IV SOLN: 80.0000 mg | INTRAVENOUS | Status: AC

## 2023-09-08 MED ORDER — LIDOCAINE HCL (PF) 1 % IJ SOLN
INTRAMUSCULAR | Status: DC | PRN
  Administered 2023-09-08: 80 mL

## 2023-09-08 MED ORDER — SODIUM CHLORIDE 0.9 % IV SOLN: INTRAVENOUS | Status: DC

## 2023-09-08 MED ORDER — BUPIVACAINE-MELOXICAM ER 400-12 MG/14ML IJ SOLN: 400.0000 mg | Freq: Once | INTRAMUSCULAR | Status: DC

## 2023-09-08 MED ORDER — FENTANYL CITRATE (PF) 100 MCG/2ML IJ SOLN
INTRAMUSCULAR | Status: DC | PRN
  Administered 2023-09-08 (×5): 12.5 ug via INTRAVENOUS

## 2023-09-08 MED ORDER — ACETAMINOPHEN 325 MG PO TABS
ORAL_TABLET | ORAL | Status: AC
  Filled 2023-09-08: qty 2

## 2023-09-08 MED ORDER — FLUTICASONE PROPIONATE 50 MCG/ACT NA SUSP: 1.0000 | Freq: Every day | NASAL | Status: DC | PRN

## 2023-09-08 MED ORDER — ONDANSETRON HCL 4 MG/2ML IJ SOLN
INTRAMUSCULAR | Status: DC | PRN
  Administered 2023-09-08: 4 mg via INTRAVENOUS

## 2023-09-08 MED ORDER — ASPIRIN 81 MG PO TBEC: 81.0000 mg | DELAYED_RELEASE_TABLET | Freq: Every day | ORAL | Status: DC

## 2023-09-08 MED ORDER — VANCOMYCIN HCL IN DEXTROSE 1-5 GM/200ML-% IV SOLN
INTRAVENOUS | Status: DC | PRN
  Administered 2023-09-08: 1000 mg via INTRAVENOUS

## 2023-09-08 MED ORDER — MIDAZOLAM HCL 2 MG/2ML IJ SOLN
INTRAMUSCULAR | Status: AC
  Filled 2023-09-08: qty 2

## 2023-09-08 MED ORDER — VANCOMYCIN HCL IN DEXTROSE 1-5 GM/200ML-% IV SOLN
1000.0000 mg | Freq: Once | INTRAVENOUS | Status: DC
Start: 2023-09-08 — End: 2023-09-08

## 2023-09-08 MED ORDER — POVIDONE-IODINE 10 % EX SWAB: 2.0000 | Freq: Once | CUTANEOUS | Status: DC

## 2023-09-08 MED ORDER — POTASSIUM CHLORIDE CRYS ER 20 MEQ PO TBCR: 40.0000 meq | EXTENDED_RELEASE_TABLET | Freq: Every day | ORAL | Status: DC

## 2023-09-08 MED ORDER — ONDANSETRON HCL 4 MG/2ML IJ SOLN
INTRAMUSCULAR | Status: AC
  Filled 2023-09-08: qty 2

## 2023-09-08 MED ORDER — HEPARIN (PORCINE) IN NACL 1000-0.9 UT/500ML-% IV SOLN
INTRAVENOUS | Status: DC | PRN
  Administered 2023-09-08: 500 mL

## 2023-09-08 MED ORDER — FENTANYL CITRATE (PF) 100 MCG/2ML IJ SOLN
INTRAMUSCULAR | Status: AC
  Filled 2023-09-08: qty 2

## 2023-09-08 MED ORDER — METOPROLOL SUCCINATE ER 100 MG PO TB24: 100.0000 mg | ORAL_TABLET | Freq: Every day | ORAL | Status: DC

## 2023-09-08 MED ORDER — SODIUM CHLORIDE 0.9 % IV SOLN
INTRAVENOUS | Status: AC
  Administered 2023-09-08: 80 mg
  Filled 2023-09-08: qty 2

## 2023-09-08 MED ORDER — CHLORHEXIDINE GLUCONATE 4 % EX SOLN: 4.0000 | Freq: Once | CUTANEOUS | Status: DC

## 2023-09-08 MED ORDER — CEFAZOLIN SODIUM-DEXTROSE 2-4 GM/100ML-% IV SOLN: 2.0000 g | INTRAVENOUS | Status: DC

## 2023-09-08 MED ORDER — LIDOCAINE HCL (PF) 1 % IJ SOLN
INTRAMUSCULAR | Status: AC
  Filled 2023-09-08: qty 60

## 2023-09-08 MED ORDER — LIDOCAINE HCL (PF) 1 % IJ SOLN
INTRAMUSCULAR | Status: AC
  Filled 2023-09-08: qty 30

## 2023-09-08 MED ORDER — VANCOMYCIN HCL IN DEXTROSE 1-5 GM/200ML-% IV SOLN
INTRAVENOUS | Status: AC
  Filled 2023-09-08: qty 200

## 2023-09-08 MED ORDER — MIDAZOLAM HCL 5 MG/5ML IJ SOLN
INTRAMUSCULAR | Status: DC | PRN
  Administered 2023-09-08 (×4): 1 mg via INTRAVENOUS

## 2023-09-08 MED ORDER — ACETAMINOPHEN 325 MG PO TABS
325.0000 mg | ORAL_TABLET | ORAL | Status: DC | PRN
  Administered 2023-09-08: 650 mg via ORAL

## 2023-09-08 SURGICAL SUPPLY — 18 items
CABLE SURGICAL S-101-97-12 (CABLE) ×1 IMPLANT
CATH ATTAIN COMMAND 6250-MB2 (CATHETERS) IMPLANT
CATH JOSEPH QUAD ALLRED 6F REP (CATHETERS) IMPLANT
CATH RIGHTSITE C315HIS02 (CATHETERS) IMPLANT
ICD COBALT XT CRT DTPA2D1 (ICD Generator) IMPLANT
KIT ESSENTIALS PG (KITS) IMPLANT
LEAD CAPSURE NOVUS 5076-52CM (Lead) IMPLANT
LEAD SELECT SECURE 3830 383069 (Lead) IMPLANT
LEAD SPRINT QUAT SEC 6935-65CM (Lead) IMPLANT
PAD DEFIB RADIO PHYSIO CONN (PAD) ×1 IMPLANT
SELECT SECURE 3830 383069 (Lead) ×1 IMPLANT
SHEATH 7FR PRELUDE SNAP 13 (SHEATH) IMPLANT
SHEATH 9FR PRELUDE SNAP 13 (SHEATH) IMPLANT
SLITTER 6232ADJ (MISCELLANEOUS) IMPLANT
TRAY PACEMAKER INSERTION (PACKS) ×1 IMPLANT
WIRE ACUITY WHISPER EDS 4648 (WIRE) IMPLANT
WIRE HI TORQ VERSACORE-J 145CM (WIRE) IMPLANT
WIRE MAILMAN 182CM (WIRE) IMPLANT

## 2023-09-08 NOTE — Progress Notes (Signed)
 Patient arrived to short stay p2. S/p Bi-V ICD insertion. Pressure dressing noted to left chest wall. Dressing is dry/intact; no bleeding or hematoma present. Arm sling in place. Patient awakens to voice; alert/oriented x4. Denies any distress. Post activity and precautions explained; verbalized understanding. VSS. Call bell near. Will continue to monitor per orders and protocol. Rachel [name]

## 2023-09-08 NOTE — Progress Notes (Signed)
 CXR result called to Dr. Ladona Ridgel. Okay to discharge home per Dr .Ladona Ridgel.

## 2023-09-08 NOTE — Discharge Instructions (Signed)
 After Your ICD (Implantable Cardiac Defibrillator)   You have a [employer]onic ICD  ACTIVITY Do not lift your arm above shoulder height for 1 week after your procedure. After 7 days, you may progress as below.  You should remove your sling 24 hours after your procedure, unless otherwise instructed by your provider.     Wednesday September 15, 2023  Thursday September 16, 2023 Friday September 17, 2023 Saturday September 18, 2023   Do not lift, push, pull, or carry anything over 10 pounds with the affected arm until 6 weeks (Wednesday October 20, 2023 ) after your procedure.   You may drive AFTER your wound check, unless you have been told otherwise by your provider.   Ask your healthcare provider when you can go back to work   INCISION/Dressing If you are on a blood thinner such as Coumadin, Xarelto, Eliquis, Plavix, or Pradaxa please confirm with your provider when this should be resumed.   If large square, outer bandage is left in place, this can be removed after 24 hours from your procedure. Do not remove steri-strips or glue as below.   Monitor your defibrillator site for redness, swelling, and drainage. Call the device clinic at [phone] if you experience these symptoms or fever/chills.  If your incision is sealed with Steri-strips or staples, you may shower 7 days after your procedure or when told by your provider. Do not remove the steri-strips or let the shower hit directly on your site. You may wash around your site with soap and water.    If you were discharged in a sling, please do not wear this during the day more than 48 hours after your surgery unless otherwise instructed. This may increase the risk of stiffness and soreness in your shoulder.   Avoid lotions, ointments, or perfumes over your incision until it is well-healed.  You may use a hot tub or a pool AFTER your wound check appointment if the incision is completely closed.  Your ICD is designed to protect you from life threatening  heart rhythms. Because of this, you may receive a shock.   1 shock with no symptoms:  Call the office during business hours. 1 shock with symptoms (chest pain, chest pressure, dizziness, lightheadedness, shortness of breath, overall feeling unwell):  Call 911. If you experience 2 or more shocks in 24 hours:  Call 911. If you receive a shock, you should not drive for 6 months per the [place] DMV IF you receive appropriate therapy from your ICD.   ICD Alerts:  Some alerts are vibratory and others beep. These are NOT emergencies. Please call our office to let us know. If this occurs at night or on weekends, it can wait until the next business day. Send a remote transmission.  If your device is capable of reading fluid status (for heart failure), you will be offered monthly monitoring to review this with you.   DEVICE MANAGEMENT Remote monitoring is used to monitor your ICD from home. This monitoring is scheduled every 91 days by our office. It allows [place] to keep an eye on the functioning of your device to ensure it is working properly. You will routinely see your Electrophysiologist annually (more often if necessary).   You should receive your ID card for your new device in 4-8 weeks. Keep this card with you at all times once received. Consider wearing a medical alert bracelet or necklace.  Your ICD  may be MRI compatible. This will be discussed at your next  office visit/wound check.  You should avoid contact with strong electric or magnetic fields.   Do not use amateur (ham) radio equipment or electric (arc) welding torches. MP3 player headphones with magnets should not be used. Some devices are safe to use if held at least 12 inches (30 cm) from your defibrillator. These include power tools, lawn mowers, and speakers. If you are unsure if something is safe to use, ask your health care provider.  When using your cell phone, hold it to the ear that is on the opposite side from the defibrillator. Do not  leave your cell phone in a pocket over the defibrillator.  You may safely use electric blankets, heating pads, computers, and microwave ovens.  Call the office right away if: You have chest pain. You feel more than one shock. You feel more short of breath than you have felt before. You feel more light-headed than you have felt before. Your incision starts to open up.  This information is not intended to replace advice given to you by your health care provider. Make sure you discuss any questions you have with your health care provider.

## 2023-09-08 NOTE — Interval H&P Note (Signed)
 History and Physical Interval Note:  09/08/2023 7:49 AM  Rachel [name]  has presented today for surgery, with the diagnosis of heart failure.  The various methods of treatment have been discussed with the patient and family. After consideration of risks, benefits and other options for treatment, the patient has consented to  Procedure(s): BIV ICD INSERTION CRT-D (N/A) as a surgical intervention.  The patient's history has been reviewed, patient examined, no change in status, stable for surgery.  I have reviewed the patient's chart and labs.  Questions were answered to the patient's satisfaction.     Lewayne Bunting

## 2023-09-08 NOTE — Progress Notes (Signed)
 Patient ambulated to BR. Removed Large outer pressure dressing . Patient d/cd home  with ICD box .

## 2023-09-09 ENCOUNTER — Encounter (HOSPITAL_COMMUNITY): Payer: Self-pay | Admitting: Internal Medicine

## 2023-09-09 MED FILL — Midazolam HCl Inj PF 2 MG/2ML (Base Equivalent): INTRAMUSCULAR | Qty: 6 | Status: AC

## 2023-09-10 ENCOUNTER — Telehealth: Payer: Self-pay | Admitting: Internal Medicine

## 2023-09-10 NOTE — Telephone Encounter (Signed)
 Patient stated Dr. Ladona Ridgel was going to extend her time out and wants to know if he has reached out to her employer to extend her time out until April.

## 2023-09-10 NOTE — Telephone Encounter (Signed)
 Left message for pt to call.

## 2023-09-10 NOTE — Telephone Encounter (Signed)
 Spoke with pt and daughter, they are going to contact FMLA and have them send [place] paperwork for an extension until April. She wants to make dr taylor aware.

## 2023-09-13 ENCOUNTER — Telehealth: Payer: Self-pay | Admitting: Internal Medicine

## 2023-09-14 ENCOUNTER — Telehealth: Payer: Self-pay | Admitting: Internal Medicine

## 2023-09-14 DIAGNOSIS — Z0279 Encounter for issue of other medical certificate: Secondary | ICD-10-CM

## 2023-09-14 NOTE — Telephone Encounter (Signed)
 Patient called to follow-up on her FMLA paperwork and noted she is scheduled to go back to work tomorrow and is not allowed to drive until 5/40.

## 2023-09-14 NOTE — Telephone Encounter (Signed)
 Spoke with patient and she states she dropped off FMLA paperwork and wanted to know have you looked at it. She is scheduled to return to work tomorrow. Did inform patient FMLA paperwork can take up to 7-10 business days

## 2023-09-15 ENCOUNTER — Ambulatory Visit: Payer: BC Managed Care – PPO

## 2023-09-15 NOTE — Telephone Encounter (Signed)
 The [employer] form was completed and faxed.  Form was scanned into chart.  Patient and billing notified.

## 2023-09-15 NOTE — Telephone Encounter (Signed)
 Noted.

## 2023-09-23 ENCOUNTER — Ambulatory Visit: Attending: Cardiology

## 2023-09-23 DIAGNOSIS — I5023 Acute on chronic systolic (congestive) heart failure: Secondary | ICD-10-CM

## 2023-09-23 NOTE — Progress Notes (Signed)
 Normal Bi-Ventricular chamber ICD wound check. Wound well healed. Presenting rhythm: AS/VP- 73 . Routine testing performed. Thresholds, sensing, and impedances consistent with implant measurements with 3.5V safety margin/auto capture until 3 month visit. No treated arrhythmias. Reviewed arm restrictions to continue for 6 weeks total post op. Reviewed shock plan.  Pt enrolled in remote follow-up.

## 2023-09-23 NOTE — Patient Instructions (Signed)

## 2023-10-08 ENCOUNTER — Other Ambulatory Visit: Payer: Self-pay | Admitting: Medical

## 2023-10-12 ENCOUNTER — Encounter: Payer: Self-pay | Admitting: Student

## 2023-10-12 ENCOUNTER — Ambulatory Visit: Payer: BC Managed Care – PPO | Admitting: Student

## 2023-10-12 VITALS — BP 136/78 | HR 78 | Ht 60.0 in | Wt 221.4 lb

## 2023-10-12 DIAGNOSIS — E785 Hyperlipidemia, unspecified: Secondary | ICD-10-CM | POA: Diagnosis not present

## 2023-10-12 DIAGNOSIS — I2510 Atherosclerotic heart disease of native coronary artery without angina pectoris: Secondary | ICD-10-CM

## 2023-10-12 DIAGNOSIS — I5032 Chronic diastolic (congestive) heart failure: Secondary | ICD-10-CM

## 2023-10-12 DIAGNOSIS — I10 Essential (primary) hypertension: Secondary | ICD-10-CM | POA: Diagnosis not present

## 2023-10-12 DIAGNOSIS — Z95810 Presence of automatic (implantable) cardiac defibrillator: Secondary | ICD-10-CM

## 2023-10-12 NOTE — Patient Instructions (Signed)
 Medication Instructions:   Take Lasix 40 mg Daily   *If you need a refill on your cardiac medications before your next appointment, please call your pharmacy*  Lab Work: Your physician recommends that you return for lab work in: 2-3 Weeks at [facility].   If you have labs (blood work) drawn today and your tests are completely normal, you will receive your results only by: MyChart Message (if you have MyChart) OR A paper copy in the mail If you have any lab test that is abnormal or we need to change your treatment, we will call you to review the results.  Testing/Procedures: NONE    Follow-Up: At [facility], you and your health needs are our priority.  As part of our continuing mission to provide you with exceptional heart care, our providers are all part of one team.  This team includes your primary Cardiologist (physician) and Advanced Practice Providers or APPs (Physician Assistants and Nurse Practitioners) who all work together to provide you with the care you need, when you need it.  Your next appointment:   5 -6  month(s)  Provider:   You may see Euell Herrlich, MD or one of the following Advanced Practice Providers on your designated Care Team:   [place], PA-C  Scotesia Eminence, [place] Theotis Flake, [place]     We recommend signing up for the patient portal called "MyChart".  Sign up information is provided on this After Visit Summary.  MyChart is used to connect with patients for Virtual Visits (Telemedicine).  Patients are able to view lab/test results, encounter notes, upcoming appointments, etc.  Non-urgent messages can be sent to your provider as well.   To learn more about what you can do with MyChart, go to [email or web address].   Other Instructions Thank you for choosing [facility]!

## 2023-10-12 NOTE — Progress Notes (Signed)
 Cardiology Office Note    Date:  10/12/2023  ID:  Rachel [name], DOB Nov 21, 1960, MRN [id number] Cardiologist: Parke Poisson, MD   EP: Dr. Ladona Ridgel  History of Present Illness:    Rachel [name] is a 63 y.o. 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   She was examined by Terrilee Croak, PA in 06/2023 and reported having intermittent shortness of breath and coughing but no chest pain. She had recently been evaluated in the ED and diagnosed with pneumonia and she was encouraged to follow-up with her PCP. A follow-up echocardiogram was recommended and this showed that her EF had improved to 60 to 65% with grade 2 diastolic dysfunction and moderate LVH. She was evaluated by Dr. Ladona Ridgel in the interim for consideration of BiV ICD placement. She underwent placement of a Medtronic BiV ICD on 09/09/2023 with no immediate complications noted.  In talking with the patient today, she reports overall doing well since her recent procedure. She feels like her energy level and shortness of breath have both improved. She denies any recent chest pain, palpitations, orthopnea, PND or pitting edema. She is unsure if she is taking Lasix 40 mg daily or 80 mg daily. She has been without her potassium supplement as she thought this could be contributing to episodes of hypotension which occurred right after ICD placement. No recurrent hypotension since.  Studies Reviewed:   EKG: EKG is ordered today and demonstrates:   EKG Interpretation Date/Time:  Tuesday October 12 2023 13:18:11 EDT Ventricular Rate:  80 PR Interval:  186 QRS Duration:  90 QT Interval:  430 QTC Calculation: 495 R Axis:   -68  Text  Interpretation: Atrial-sensed ventricular-paced rhythm Confirmed by Randall An ([id number]) on 10/12/2023 1:19:20 PM       cMRI: 01/2023 IMPRESSION: 1. Left ventricular chamber size is normal with moderately reduced LV systolic function, LVEF 35%.   2. Right ventricular chamber size is normal with low normal RV systolic function, RVEF 52%.   3. No delayed myocardial enhancement. No myocardial edema. ECV 31%, nonspecific elevation.   4. 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 LBBB cardiomyopathy.  Echocardiogram: 07/2023 IMPRESSIONS     1. Left ventricular ejection fraction, by estimation, is 60 to 65%. The  left ventricle has normal function. The left ventricle has no regional  wall motion abnormalities. There is moderate concentric left ventricular  hypertrophy. Left ventricular  diastolic parameters are consistent with Grade II diastolic dysfunction  (pseudonormalization).   2. Right ventricular systolic function is normal. The right ventricular  size is normal. Tricuspid regurgitation signal is inadequate for assessing  PA pressure.   3. The mitral valve is degenerative. Trivial mitral valve regurgitation.   4. The aortic valve is tricuspid. There is mild calcification of the  aortic valve. Aortic valve regurgitation is not visualized. Aortic valve  sclerosis/calcification is present, without any evidence of aortic  stenosis.   5. The inferior vena cava is normal in size with greater than 50%  respiratory variability, suggesting right atrial pressure of 3 mmHg.   Comparison(s): Prior images reviewed side by side. LVEF has improved, now  60-65% range.    Physical  Exam:   VS:  BP 136/78   Pulse 78   Ht 5' (1.524 m)   Wt 221 lb 6.4 oz (100.4 kg)   SpO2 96%   BMI 43.24 kg/m    Wt Readings from Last 3 Encounters:  10/12/23 221 lb 6.4 oz (100.4 kg)  09/08/23 213 lb (96.6 kg)  09/01/23 213 lb (96.6  kg)     GEN: Well nourished, well developed female appearing in no acute distress NECK: No JVD; No carotid bruits CARDIAC: RRR, no murmurs, rubs, gallops. ICD site without erythema or drainage.  RESPIRATORY:  Clear to auscultation without rales, wheezing or rhonchi  ABDOMEN: Appears non-distended. No obvious abdominal masses. EXTREMITIES: No clubbing or cyanosis. No pitting edema.  Distal pedal pulses are 2+ bilaterally.   Assessment and Plan:   1. Chronic HFimpEF - Her EF was previously 35% by cardiac MRI in 01/2023 and had improved to 60 to 65% by repeat echo in 07/2023.  She appears euvolemic by examination today and reports her respiratory status has improved since undergoing BiV placement.   - Continue current medical therapy with Jardiance 10 mg daily, Lasix 40 mg daily, Toprol-XL 100 mg daily, Entresto 49-51 mg twice daily and Spironolactone 25 mg daily. Encouraged her to restart K+ supplementation. Will recheck a CMET.   2. ICD in Place - She does have a BiV ICD in place which was placed by Dr. Carolynne Citron in 08/2023. She is in an A-sensed, V-paced rhythm by EKG today. Keep scheduled follow-up with EP in 11/2023.  3. CAD/HLD - Prior cardiac catheterization in 01/2022 showed mild to moderate CAD with medical management recommended.  She denies any recent anginal symptoms.  - LDL was at 106 when checked in 02/2023 but she had been on a lower dose of Atorvastatin but is now on 80 mg daily. Will recheck an FLP and LFT's.  4. HTN - BP was initially recorded at 154/80, rechecked and improved to 136/78. Continue current medical therapy with Toprol-XL 100 mg daily, Entresto 49-51 mg twice daily and Spironolactone 25 mg daily.  Signed, Dorma Gash, PA-C

## 2023-10-26 ENCOUNTER — Encounter: Payer: Self-pay | Admitting: Internal Medicine

## 2023-10-26 ENCOUNTER — Ambulatory Visit (INDEPENDENT_AMBULATORY_CARE_PROVIDER_SITE_OTHER)

## 2023-10-26 DIAGNOSIS — I447 Left bundle-branch block, unspecified: Secondary | ICD-10-CM | POA: Diagnosis not present

## 2023-10-26 LAB — CUP PACEART REMOTE DEVICE CHECK
Battery Remaining Longevity: 133 mo
Battery Voltage: 3.05 V
Brady Statistic AP VP Percent: 0.08 %
Brady Statistic AP VS Percent: 0.01 %
Brady Statistic AS VP Percent: 99.88 %
Brady Statistic AS VS Percent: 0.03 %
Brady Statistic RA Percent Paced: 0.09 %
Brady Statistic RV Percent Paced: 0 %
Date Time Interrogation Session: 20250429000145
HighPow Impedance: 62 Ohm
Implantable Lead Connection Status: 753985
Implantable Lead Connection Status: 753985
Implantable Lead Connection Status: 753985
Implantable Lead Implant Date: 20250312
Implantable Lead Implant Date: 20250312
Implantable Lead Implant Date: 20250312
Implantable Lead Location: 753859
Implantable Lead Location: 753860
Implantable Lead Location: 753860
Implantable Lead Model: 3830
Implantable Lead Model: 5076
Implantable Pulse Generator Implant Date: 20250312
Lead Channel Impedance Value: 228 Ohm
Lead Channel Impedance Value: 361 Ohm
Lead Channel Impedance Value: 380 Ohm
Lead Channel Impedance Value: 437 Ohm
Lead Channel Impedance Value: 494 Ohm
Lead Channel Impedance Value: 551 Ohm
Lead Channel Pacing Threshold Amplitude: 0.5 V
Lead Channel Pacing Threshold Pulse Width: 0.4 ms
Lead Channel Sensing Intrinsic Amplitude: 27.8 mV
Lead Channel Sensing Intrinsic Amplitude: 5.3 mV
Lead Channel Setting Pacing Amplitude: 1 V
Lead Channel Setting Pacing Amplitude: 3.5 V
Lead Channel Setting Pacing Pulse Width: 0.4 ms
Lead Channel Setting Sensing Sensitivity: 0.3 mV
Zone Setting Status: 755011
Zone Setting Status: 755011
Zone Setting Status: 755011

## 2023-11-01 DIAGNOSIS — J449 Chronic obstructive pulmonary disease, unspecified: Secondary | ICD-10-CM | POA: Diagnosis not present

## 2023-11-01 DIAGNOSIS — I10 Essential (primary) hypertension: Secondary | ICD-10-CM | POA: Diagnosis not present

## 2023-11-01 DIAGNOSIS — Z6841 Body Mass Index (BMI) 40.0 and over, adult: Secondary | ICD-10-CM | POA: Diagnosis not present

## 2023-11-01 DIAGNOSIS — I5032 Chronic diastolic (congestive) heart failure: Secondary | ICD-10-CM | POA: Diagnosis not present

## 2023-11-01 DIAGNOSIS — F419 Anxiety disorder, unspecified: Secondary | ICD-10-CM | POA: Diagnosis not present

## 2023-11-01 DIAGNOSIS — E7800 Pure hypercholesterolemia, unspecified: Secondary | ICD-10-CM | POA: Diagnosis not present

## 2023-11-02 LAB — COMPREHENSIVE METABOLIC PANEL WITH GFR
ALT: 11 IU/L (ref 0–32)
AST: 15 IU/L (ref 0–40)
Albumin: 4.4 g/dL (ref 3.9–4.9)
Alkaline Phosphatase: 131 IU/L — ABNORMAL HIGH (ref 44–121)
BUN/Creatinine Ratio: 22 (ref 12–28)
BUN: 11 mg/dL (ref 8–27)
Bilirubin Total: 0.3 mg/dL (ref 0.0–1.2)
CO2: 24 mmol/L (ref 20–29)
Calcium: 9.7 mg/dL (ref 8.7–10.3)
Chloride: 101 mmol/L (ref 96–106)
Creatinine, Ser: 0.51 mg/dL — ABNORMAL LOW (ref 0.57–1.00)
Globulin, Total: 2.6 g/dL (ref 1.5–4.5)
Glucose: 101 mg/dL — ABNORMAL HIGH (ref 70–99)
Potassium: 4.1 mmol/L (ref 3.5–5.2)
Sodium: 141 mmol/L (ref 134–144)
Total Protein: 7 g/dL (ref 6.0–8.5)
eGFR: 106 mL/min/{1.73_m2} (ref >59)

## 2023-11-02 LAB — LIPID PANEL
Chol/HDL Ratio: 4 ratio (ref 0.0–4.4)
Cholesterol, Total: 226 mg/dL — ABNORMAL HIGH (ref 100–199)
HDL: 57 mg/dL (ref >39)
LDL Chol Calc (NIH): 151 mg/dL — ABNORMAL HIGH (ref 0–99)
Triglycerides: 104 mg/dL (ref 0–149)
VLDL Cholesterol Cal: 18 mg/dL (ref 5–40)

## 2023-11-03 ENCOUNTER — Other Ambulatory Visit: Payer: Self-pay

## 2023-11-03 ENCOUNTER — Other Ambulatory Visit: Payer: Self-pay | Admitting: *Deleted

## 2023-11-03 DIAGNOSIS — Z79899 Other long term (current) drug therapy: Secondary | ICD-10-CM

## 2023-11-03 MED ORDER — ATORVASTATIN CALCIUM 80 MG PO TABS: 80.0000 mg | ORAL_TABLET | Freq: Every day | ORAL | 3 refills | Status: DC

## 2023-11-03 NOTE — Telephone Encounter (Signed)
 Patient is returning phone call.

## 2023-11-09 ENCOUNTER — Ambulatory Visit: Payer: Self-pay | Admitting: *Deleted

## 2023-11-11 ENCOUNTER — Encounter: Payer: Self-pay | Admitting: *Deleted

## 2023-11-12 ENCOUNTER — Telehealth: Payer: Self-pay | Admitting: Internal Medicine

## 2023-11-12 NOTE — Telephone Encounter (Signed)
 Patient was returning call for results. Please advise

## 2023-11-12 NOTE — Telephone Encounter (Signed)
 Per Woodfin Hays, PA-C:  Would resume Lipitor 80mg  daily and recheck FLP and LFT's in 2-3 months.   Thanks,  [place]    Patient notified and verbalized understanding.

## 2023-11-29 ENCOUNTER — Telehealth: Payer: Self-pay

## 2023-11-29 NOTE — Telephone Encounter (Signed)
 HF diagnostics currently abnormal.  Attempted to contact patient. No answer, left message to call back.

## 2023-11-30 NOTE — Telephone Encounter (Signed)
 Patient reports recent cough which is new for her. She has attributed it to weather changes. Denies any fluid retention symptoms such as swelling in extremities/abdomen, needing to sleep on extra pillows or shortness of breath.   Patient sometimes misses her Lasix  40 mg daily does. Compliant with other medications. Pt encouraged to take lasix  daily as not taking can result in extra fluid on board. Patient voiced understanding.  Scheduled another transmission 12/10/23 to recheck Optivol.   Routing to [place], [place]  Dr. Carolynne Citron to see if any further recommendations are advised.   Patient is agreeable to call back if any new symptoms arise.

## 2023-11-30 NOTE — Telephone Encounter (Signed)
 Left detailed message on VM (ok per DPR) advising Aldon Ambrosia, PA recommendations. Also advised patient a remote rechecked was scheduled next Friday 12/10/23. Pt advised to call if she has any additional questions or concerns.

## 2023-12-10 ENCOUNTER — Ambulatory Visit (INDEPENDENT_AMBULATORY_CARE_PROVIDER_SITE_OTHER)

## 2023-12-10 DIAGNOSIS — I447 Left bundle-branch block, unspecified: Secondary | ICD-10-CM

## 2023-12-10 NOTE — Progress Notes (Signed)
 Remote ICD transmission.

## 2023-12-10 NOTE — Telephone Encounter (Signed)
 Optivol remains elevated.   Routing to [place] office to review per previous note.

## 2023-12-21 DIAGNOSIS — F419 Anxiety disorder, unspecified: Secondary | ICD-10-CM | POA: Diagnosis not present

## 2023-12-21 DIAGNOSIS — K219 Gastro-esophageal reflux disease without esophagitis: Secondary | ICD-10-CM | POA: Diagnosis not present

## 2023-12-21 DIAGNOSIS — J449 Chronic obstructive pulmonary disease, unspecified: Secondary | ICD-10-CM | POA: Diagnosis not present

## 2023-12-21 DIAGNOSIS — I10 Essential (primary) hypertension: Secondary | ICD-10-CM | POA: Diagnosis not present

## 2023-12-21 DIAGNOSIS — Z6841 Body Mass Index (BMI) 40.0 and over, adult: Secondary | ICD-10-CM | POA: Diagnosis not present

## 2023-12-23 ENCOUNTER — Ambulatory Visit: Payer: BC Managed Care – PPO | Admitting: Internal Medicine

## 2024-01-21 ENCOUNTER — Other Ambulatory Visit: Payer: Self-pay | Admitting: Student

## 2024-01-25 ENCOUNTER — Ambulatory Visit

## 2024-01-25 DIAGNOSIS — I447 Left bundle-branch block, unspecified: Secondary | ICD-10-CM

## 2024-01-26 ENCOUNTER — Telehealth: Payer: Self-pay

## 2024-01-26 LAB — CUP PACEART REMOTE DEVICE CHECK
Battery Remaining Longevity: 129 mo
Battery Voltage: 3.02 V
Brady Statistic RV Percent Paced: 0 %
Date Time Interrogation Session: 20250728201458
HighPow Impedance: 60 Ohm
Implantable Lead Connection Status: 753985
Implantable Lead Connection Status: 753985
Implantable Lead Connection Status: 753985
Implantable Lead Implant Date: 20250312
Implantable Lead Implant Date: 20250312
Implantable Lead Implant Date: 20250312
Implantable Lead Location: 753859
Implantable Lead Location: 753860
Implantable Lead Location: 753860
Implantable Lead Model: 3830
Implantable Lead Model: 5076
Implantable Pulse Generator Implant Date: 20250312
Lead Channel Impedance Value: 247 Ohm
Lead Channel Impedance Value: 342 Ohm
Lead Channel Impedance Value: 361 Ohm
Lead Channel Impedance Value: 399 Ohm
Lead Channel Impedance Value: 418 Ohm
Lead Channel Impedance Value: 570 Ohm
Lead Channel Pacing Threshold Amplitude: 0.5 V
Lead Channel Pacing Threshold Pulse Width: 0.4 ms
Lead Channel Sensing Intrinsic Amplitude: 28.9 mV
Lead Channel Sensing Intrinsic Amplitude: 4.3 mV
Lead Channel Setting Pacing Amplitude: 1 V
Lead Channel Setting Pacing Amplitude: 3.5 V
Lead Channel Setting Pacing Pulse Width: 0.4 ms
Lead Channel Setting Sensing Sensitivity: 0.3 mV
Zone Setting Status: 755011
Zone Setting Status: 755011
Zone Setting Status: 755011

## 2024-01-26 NOTE — Telephone Encounter (Signed)
 HF diagnostics are currently abnormal.  Attempted to contact patient. No answer, left message to call back.

## 2024-01-28 ENCOUNTER — Ambulatory Visit: Payer: Self-pay | Admitting: Internal Medicine

## 2024-02-01 NOTE — Progress Notes (Signed)
 Remote ICD transmission.

## 2024-02-03 NOTE — Telephone Encounter (Signed)
 Call x1; attempts made to reach pt to r/s 6/26 appt - pts mobile/home & emergency contacts, calls either couldn't be completed or no vm. device rn aware, will keep trying to reach pt.

## 2024-02-03 NOTE — Telephone Encounter (Signed)
 Call x2; attempts made to reach pt to r/s 6/26 appt - pts mobile/home & emergency contacts, calls either couldn't be completed or no vm. will keep trying to reach pt.

## 2024-02-07 NOTE — Telephone Encounter (Signed)
 Call x3; spoke w/ dtr Bobetta after not being able to reach the patient, Bobetta gave me the patients new contact - [phone], lvmtcb w/ patients new contact to r/s 6/26 appt to EP APP.  Device - I will try a 4th attempt to reach patient before resulting to sending patient a letter.

## 2024-02-11 ENCOUNTER — Encounter: Payer: Self-pay | Admitting: Emergency Medicine

## 2024-03-05 NOTE — Progress Notes (Unsigned)
 Cardiology Office Note:  .   Date:  03/05/2024  ID:  Rachel [name] Husband, DOB [birth year], MRN [id number] PCP: Bertell Satterfield, MD  [facility] Providers Cardiologist:  [name], MD {  History of Present Illness: .   Rachel [name] is a 63 y.o. female w/PMHx of  HTN, HLD, OSA LBBB, NICM CAD (non-obstructive) CRT-D  Referred to Dr. Waddell for consideration of ICD, felt to be a candidate for CRT-D  Implanted 09/08/23  Saw cards team 10/12/23, uncertainty on if/how taking her lasix , reported feeling improved post ICD   Device team/schedulers have been unable to reach the pt Noting abnormal HF diagnostics  Today's visit is scheduled as her 91 day post implant visit ROS:   She is doing well Intermittently feels a bit bloated, SOB, admittedly doesn't take her lasix  daily (2/2 frequent urination) and perhaps not particularly strict about NA restriction. No CP No site concerns No near syncope or near syncope No shocks  Working at [employer], sounds in inventory, will occasionally when a heavier day at work, notice some L neck swelling    Device information MDT CRT-D implanted 09/08/23  He has a 3830 in LB area (LV port)  and 6935 in the apex (RV port)   Studies Reviewed: SABRA    EKG done today and reviewed by myself SR/VPaced, 72bpm, QRS duration is  DEVICE interrogation done today and reviewed by myself Battery and lead measurements are good No arrhythmias She has had some VS events Longest is 2 minutes, appears to ST w/AV conduction (also w/FF on the A channel) 99% LV paced OptiVol is back down   08/11/23: TTE 1. Left ventricular ejection fraction, by estimation, is 60 to 65%. The  left ventricle has normal function. The left ventricle has no regional  wall motion abnormalities. There is moderate concentric left ventricular  hypertrophy. Left ventricular  diastolic parameters are consistent with Grade II diastolic dysfunction  (pseudonormalization).    2. Right ventricular systolic function is normal. The right ventricular  size is normal. Tricuspid regurgitation signal is inadequate for assessing  PA pressure.   3. The mitral valve is degenerative. Trivial mitral valve regurgitation.   4. The aortic valve is tricuspid. There is mild calcification of the  aortic valve. Aortic valve regurgitation is not visualized. Aortic valve  sclerosis/calcification is present, without any evidence of aortic  stenosis.   5. The inferior vena cava is normal in size with greater than 50%  respiratory variability, suggesting right atrial pressure of 3 mmHg.   Comparison(s): Prior images reviewed side by side. LVEF has improved, now  60-65% range.    02/22/24: c.MRI IMPRESSION: 1. Left ventricular chamber size is normal with moderately reduced LV systolic function, LVEF 35%.   2. Right ventricular chamber size is normal with low normal RV systolic function, RVEF 52%.   3. No delayed myocardial enhancement. No myocardial edema. ECV 31%, nonspecific elevation.   4. 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 LBBB cardiomyopathy.   Risk Assessment/Calculations:    Physical Exam:   VS:  There were no vitals taken for this visit.   Wt Readings from Last 3 Encounters:  10/12/23 221 lb 6.4 oz (100.4 kg)  09/08/23 213 lb (96.6 kg)  09/01/23 213 lb (96.6 kg)    GEN: Well nourished, well developed in no acute distress NECK: No JVD; No carotid bruits She appears to have some non tender, soft tissue swelling base L neck,  does not appear vascular in etiology CARDIAC: RRR, no murmurs, rubs, gallops RESPIRATORY:  CTA b/l without rales, wheezing or rhonchi  ABDOMEN: Soft, non-tender, non-distended EXTREMITIES: No edema; No deformity   ICD site: has a small keloid, is stable, no thinning, fluctuation, tethering  ASSESSMENT AND PLAN: .    CRT-D intact function Chronic output programmed  RA lead no other programming changes made  LBBB NICM Chronic CHF Recovery of LVEF by last echo (?) Advised minimize sodium intake, and take her lasix  daily Optivol had been high though is back down C/w Dr. Costella   Dispo: remotes as usual, in clinic again in a year, sooner if needed  Signed, Rachel Macario Arthur, PA-C

## 2024-03-06 ENCOUNTER — Ambulatory Visit: Attending: Physician Assistant | Admitting: Physician Assistant

## 2024-03-06 ENCOUNTER — Encounter: Payer: Self-pay | Admitting: Physician Assistant

## 2024-03-06 VITALS — BP 124/60 | HR 71 | Ht 60.0 in | Wt 204.0 lb

## 2024-03-06 DIAGNOSIS — I447 Left bundle-branch block, unspecified: Secondary | ICD-10-CM | POA: Diagnosis not present

## 2024-03-06 DIAGNOSIS — I428 Other cardiomyopathies: Secondary | ICD-10-CM

## 2024-03-06 DIAGNOSIS — Z79899 Other long term (current) drug therapy: Secondary | ICD-10-CM

## 2024-03-06 DIAGNOSIS — Z95810 Presence of automatic (implantable) cardiac defibrillator: Secondary | ICD-10-CM | POA: Diagnosis not present

## 2024-03-06 LAB — CUP PACEART INCLINIC DEVICE CHECK
Date Time Interrogation Session: 20250908191610
Implantable Lead Connection Status: 753985
Implantable Lead Connection Status: 753985
Implantable Lead Connection Status: 753985
Implantable Lead Implant Date: 20250312
Implantable Lead Implant Date: 20250312
Implantable Lead Implant Date: 20250312
Implantable Lead Location: 753859
Implantable Lead Location: 753860
Implantable Lead Location: 753860
Implantable Lead Model: 3830
Implantable Lead Model: 5076
Implantable Pulse Generator Implant Date: 20250312

## 2024-03-06 NOTE — Patient Instructions (Signed)
 Medication Instructions:   Your physician recommends that you continue on your current medications as directed. Please refer to the Current Medication list given to you today.   *If you need a refill on your cardiac medications before your next appointment, please call your pharmacy*    Lab Work: NONE ORDERED  TODAY     If you have labs (blood work) drawn today and your tests are completely normal, you will receive your results only by: MyChart Message (if you have MyChart) OR A paper copy in the mail If you have any lab test that is abnormal or we need to change your treatment, we will call you to review the results.    Testing/Procedures: NONE ORDERED  TODAY    Follow-Up: At [facility], you and your health needs are our priority.  As part of our continuing mission to provide you with exceptional heart care, our providers are all part of one team.  This team includes your primary Cardiologist (physician) and Advanced Practice Providers or APPs (Physician Assistants and Nurse Practitioners) who all work together to provide you with the care you need, when you need it.   Your next appointment:  LAYMON QUA  IN   OCTOBER  9 RECALL)      6 month(s)  Provider:   Charlies Arthur, PA-C    We recommend signing up for the patient portal called MyChart.  Sign up information is provided on this After Visit Summary.  MyChart is used to connect with patients for Virtual Visits (Telemedicine).  Patients are able to view lab/test results, encounter notes, upcoming appointments, etc.  Non-urgent messages can be sent to your provider as well.   To learn more about what you can do with MyChart, go to [email or web address].   Other Instructions

## 2024-03-10 DIAGNOSIS — F410 Panic disorder [episodic paroxysmal anxiety] without agoraphobia: Secondary | ICD-10-CM | POA: Diagnosis not present

## 2024-03-10 DIAGNOSIS — I10 Essential (primary) hypertension: Secondary | ICD-10-CM | POA: Diagnosis not present

## 2024-03-10 DIAGNOSIS — Z299 Encounter for prophylactic measures, unspecified: Secondary | ICD-10-CM | POA: Diagnosis not present

## 2024-03-10 DIAGNOSIS — E7800 Pure hypercholesterolemia, unspecified: Secondary | ICD-10-CM | POA: Diagnosis not present

## 2024-03-10 DIAGNOSIS — I509 Heart failure, unspecified: Secondary | ICD-10-CM | POA: Diagnosis not present

## 2024-03-29 NOTE — Progress Notes (Signed)
 Remote ICD Transmission

## 2024-04-25 ENCOUNTER — Ambulatory Visit (INDEPENDENT_AMBULATORY_CARE_PROVIDER_SITE_OTHER)

## 2024-04-25 DIAGNOSIS — I447 Left bundle-branch block, unspecified: Secondary | ICD-10-CM

## 2024-04-26 LAB — CUP PACEART REMOTE DEVICE CHECK
Battery Remaining Longevity: 127 mo
Battery Voltage: 3.01 V
Brady Statistic RV Percent Paced: 0 %
Date Time Interrogation Session: 20251028042842
HighPow Impedance: 64 Ohm
Implantable Lead Connection Status: 753985
Implantable Lead Connection Status: 753985
Implantable Lead Connection Status: 753985
Implantable Lead Implant Date: 20250312
Implantable Lead Implant Date: 20250312
Implantable Lead Implant Date: 20250312
Implantable Lead Location: 753859
Implantable Lead Location: 753860
Implantable Lead Location: 753860
Implantable Lead Model: 3830
Implantable Lead Model: 5076
Implantable Pulse Generator Implant Date: 20250312
Lead Channel Impedance Value: 247 Ohm
Lead Channel Impedance Value: 342 Ohm
Lead Channel Impedance Value: 361 Ohm
Lead Channel Impedance Value: 418 Ohm
Lead Channel Impedance Value: 494 Ohm
Lead Channel Impedance Value: 570 Ohm
Lead Channel Pacing Threshold Amplitude: 0.5 V
Lead Channel Pacing Threshold Pulse Width: 0.4 ms
Lead Channel Sensing Intrinsic Amplitude: 29.3 mV
Lead Channel Sensing Intrinsic Amplitude: 4.8 mV
Lead Channel Setting Pacing Amplitude: 1 V
Lead Channel Setting Pacing Amplitude: 2.5 V
Lead Channel Setting Pacing Pulse Width: 0.4 ms
Lead Channel Setting Sensing Sensitivity: 0.3 mV
Zone Setting Status: 755011
Zone Setting Status: 755011
Zone Setting Status: 755011

## 2024-05-01 ENCOUNTER — Other Ambulatory Visit: Payer: Self-pay | Admitting: Internal Medicine

## 2024-05-01 ENCOUNTER — Other Ambulatory Visit: Payer: Self-pay | Admitting: Student

## 2024-05-01 DIAGNOSIS — Z1231 Encounter for screening mammogram for malignant neoplasm of breast: Secondary | ICD-10-CM

## 2024-05-02 ENCOUNTER — Inpatient Hospital Stay: Admission: RE | Admit: 2024-05-02 | Source: Ambulatory Visit

## 2024-05-02 NOTE — Progress Notes (Signed)
 Remote ICD Transmission

## 2024-05-04 ENCOUNTER — Ambulatory Visit: Payer: Self-pay | Admitting: Internal Medicine

## 2024-05-05 ENCOUNTER — Encounter: Payer: Self-pay | Admitting: Student

## 2024-05-05 ENCOUNTER — Ambulatory Visit: Attending: Student | Admitting: Student

## 2024-05-05 VITALS — BP 152/78 | HR 63 | Ht 60.0 in | Wt 215.2 lb

## 2024-05-05 DIAGNOSIS — I5020 Unspecified systolic (congestive) heart failure: Secondary | ICD-10-CM

## 2024-05-05 DIAGNOSIS — I2510 Atherosclerotic heart disease of native coronary artery without angina pectoris: Secondary | ICD-10-CM | POA: Diagnosis not present

## 2024-05-05 DIAGNOSIS — E785 Hyperlipidemia, unspecified: Secondary | ICD-10-CM

## 2024-05-05 DIAGNOSIS — Z95810 Presence of automatic (implantable) cardiac defibrillator: Secondary | ICD-10-CM

## 2024-05-05 DIAGNOSIS — I10 Essential (primary) hypertension: Secondary | ICD-10-CM | POA: Diagnosis not present

## 2024-05-05 MED ORDER — EMPAGLIFLOZIN 10 MG PO TABS
10.0000 mg | ORAL_TABLET | Freq: Every day | ORAL | 3 refills | Status: AC
Start: 2024-05-05 — End: ?

## 2024-05-05 MED ORDER — SPIRONOLACTONE 25 MG PO TABS: 25.0000 mg | ORAL_TABLET | Freq: Every day | ORAL | 3 refills | Status: AC

## 2024-05-05 MED ORDER — ATORVASTATIN CALCIUM 80 MG PO TABS: 80.0000 mg | ORAL_TABLET | Freq: Every day | ORAL | 3 refills | Status: AC

## 2024-05-05 MED ORDER — SACUBITRIL-VALSARTAN 49-51 MG PO TABS: 1.0000 | ORAL_TABLET | Freq: Two times a day (BID) | ORAL | 3 refills | Status: AC

## 2024-05-05 MED ORDER — POTASSIUM CHLORIDE CRYS ER 20 MEQ PO TBCR: 40.0000 meq | EXTENDED_RELEASE_TABLET | Freq: Every day | ORAL | 3 refills | Status: AC

## 2024-05-05 MED ORDER — METOPROLOL SUCCINATE ER 100 MG PO TB24: 100.0000 mg | ORAL_TABLET | Freq: Every day | ORAL | 3 refills | Status: AC

## 2024-05-05 MED ORDER — FUROSEMIDE 40 MG PO TABS: 40.0000 mg | ORAL_TABLET | Freq: Every day | ORAL | 3 refills | Status: AC

## 2024-05-05 NOTE — Patient Instructions (Signed)
 Medication Instructions:   Restart Entresto , Jardiance  and Spironolactone  while continuing your other cardiac medications.  *If you need a refill on your cardiac medications before your next appointment, please call your pharmacy*  Lab Work:  CMET and FLP and [employer] If you have labs (blood work) drawn today and your tests are completely normal, you will receive your results only by: MyChart Message (if you have MyChart) OR A paper copy in the mail If you have any lab test that is abnormal or we need to change your treatment, we will call you to review the results.  Follow-Up: At [facility], you and your health needs are our priority.  As part of our continuing mission to provide you with exceptional heart care, our providers are all part of one team.  This team includes your primary Cardiologist (physician) and Advanced Practice Providers or APPs (Physician Assistants and Nurse Practitioners) who all work together to provide you with the care you need, when you need it.  Your next appointment:   6 month(s)  Provider:   You may see [name], MD or one of the following Advanced Practice Providers on your designated Care Team:   Minervia Osso, PA-C  Scotesia [place], [place] Olivia Pavy, [place]     We recommend signing up for the patient portal called MyChart.  Sign up information is provided on this After Visit Summary.  MyChart is used to connect with patients for Virtual Visits (Telemedicine).  Patients are able to view lab/test results, encounter notes, upcoming appointments, etc.  Non-urgent messages can be sent to your provider as well.   To learn more about what you can do with MyChart, go to [email or web address].

## 2024-05-05 NOTE — Progress Notes (Signed)
 Cardiology Office Note    Date:  05/05/2024  ID:  Delon LITTIE Husband, DOB [birth year], MRN [id number] Cardiologist: [name], MD Cardiology APP:  Johnson Laymon HERO, PA-C { :  History of Present Illness:    Rachel [name] is a 63 y.o. 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  She was examined by myself in 09/2023 and reported improvement in her energy level and stamina since undergoing BiV ICD placement. She was continued on Jardiance  10 mg daily, Lasix  40 mg daily, Toprol -XL 100 mg daily, Entresto  49-51 mg twice daily, Spironolactone  25 mg daily and Atorvastatin  80 mg daily.  She did have follow-up with Charlies Arthur, PA in 02/2024 and reported some shortness of breath and abdominal distention and was not taking her Lasix  daily due to urination. Device was functioning normally. Was encouraged to take Lasix  daily and follow-up with EP in 1 year.  In talking with the patient today, she reports overall doing well until this past week as she has developed more shortness of breath and lower extremity edema. Says that she switched pharmacies and has been without Jardiance , Entresto  and Spironolactone  during this timeframe. She was overall feeling well prior to this. She denies any exertional chest pain but does report occasional palpitations over the past week as well. She has not checked her blood pressure at home but it was initially recorded at 170/82 during today's visit, rechecked and at 152/78. BP was previously well-controlled at 124/60 during her office visit in 02/2024 when she was taking her regular  medications at that time  No recent orthopnea or PND. Reports she has not had lab work since earlier this year given her work schedule as she has been working 12-hour shifts 5 to 6 days/week.  Studies Reviewed:   EKG: EKG is not ordered today. EKG from 03/06/2024 shows A-sensed, V-paced rhythm, HR 72.  Cardiac Catheterization: 01/2022   Prox RCA-1 lesion is 20% stenosed.   Prox RCA-2 lesion is 20% stenosed.   Mid RCA to Dist RCA lesion is 20% stenosed.   Acute Mrg lesion is 60% stenosed.   2nd Mrg lesion is 30% stenosed.   Prox LAD lesion is 5% stenosed.   Mild to moderate multivessel coronary calcification with mild-moderate nonobstructive CAD.   LV EDP 29 mmHg   RECOMMENDATION: Guideline directed medical therapy for HFrEF.  Aggressive lipid-lowering therapy with target LDL less than 70.  cMRI: 02/2023 IMPRESSION: 1. Left ventricular chamber size is normal with moderately reduced LV systolic function, LVEF 35%.   2. Right ventricular chamber size is normal with low normal RV systolic function, RVEF 52%.   3. No delayed myocardial enhancement. No myocardial edema. ECV 31%, nonspecific elevation.   4. 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 LBBB cardiomyopathy.  Echocardiogram: 07/2023 IMPRESSIONS     1. Left ventricular ejection fraction, by estimation, is 60 to 65%. The  left ventricle has normal function. The left ventricle has no regional  wall motion abnormalities. There is moderate concentric left ventricular  hypertrophy. Left ventricular  diastolic parameters are consistent with  Grade II diastolic dysfunction  (pseudonormalization).   2. Right ventricular systolic function is normal. The right ventricular  size is normal. Tricuspid regurgitation signal is inadequate for assessing  PA pressure.   3. The mitral valve is degenerative. Trivial mitral valve regurgitation.   4. The aortic valve is  tricuspid. There is mild calcification of the  aortic valve. Aortic valve regurgitation is not visualized. Aortic valve  sclerosis/calcification is present, without any evidence of aortic  stenosis.   5. The inferior vena cava is normal in size with greater than 50%  respiratory variability, suggesting right atrial pressure of 3 mmHg.   Comparison(s): Prior images reviewed side by side. LVEF has improved, now  60-65% range.   Risk Assessment/Calculations:     HYPERTENSION CONTROL Vitals:   05/05/24 1455 05/05/24 1522  BP: (!) 170/82 (!) 152/78    The patient's blood pressure is elevated above target today. In order to address the patient's elevated BP: A new medication was prescribed today.    Physical Exam:   VS:  BP (!) 152/78   Pulse 63   Ht 5' (1.524 m)   Wt 215 lb 3.2 oz (97.6 kg)   SpO2 95%   BMI 42.03 kg/m    Wt Readings from Last 3 Encounters:  05/05/24 215 lb 3.2 oz (97.6 kg)  03/06/24 204 lb (92.5 kg)  10/12/23 221 lb 6.4 oz (100.4 kg)     GEN: Well nourished, well developed female appearing in no acute distress NECK: No JVD; No carotid bruits CARDIAC: RRR, no murmurs, rubs, gallops RESPIRATORY:  Clear to auscultation without rales, wheezing or rhonchi  ABDOMEN: Appears non-distended. No obvious abdominal masses. EXTREMITIES: No clubbing or cyanosis. Trace ankle edema bilaterally.  Distal pedal pulses are 2+ bilaterally.   Assessment and Plan:   1. Heart failure with improved ejection fraction (HFimpEF) (HCC) - Her EF was previously 35% by cardiac MRI in 01/2023 and had improved to 60 to 65% by repeat imaging in 07/2023. She does report dyspnea on exertion and lower extremity edema since she has been without Jardiance , Entresto  and Spironolactone . Was overall feeling well prior to this.   - Will restart Jardiance  10 mg daily, Entresto  49-51 mg twice daily and Spironolactone  25 mg daily. Continue Lasix  40 mg daily and Toprol -XL 100 mg daily. Will obtain a  follow-up CMET for reassessment of electrolytes and renal function.  2. LBBB/Biventricular ICD (implantable cardioverter-defibrillator) in place - She does have a Medtronic BiV ICD in place and recent device interrogation last week showed this was functioning normally.  Followed by Dr. Waddell.  3. Coronary artery disease involving native coronary artery of native heart without angina pectoris/HLD - Prior cardiac catheterization in 01/2022 showed mild to moderate nonobstructive disease with medical management recommended. She does report having some shortness of breath this past week but this has been in the setting of being without several of her medications. Was previously overall feeling well with no chest pain or dyspnea on exertion. Will restart medical therapy as discussed above. - Continue ASA 81 mg daily and Atorvastatin  80 mg daily. LDL was elevated at 151 in 10/2023 but it was unclear if she was taking her statin at that time. She has not had follow-up labs in the interim and we will reorder a FLP and CMET today.  4. Essential hypertension - BP was initially recorded at 170/82, rechecked and at 152/78.  She has been without several of her medications as outlined above including Entresto  and Spironolactone .  Will continue Toprol -XL 100 mg daily but restart Entresto  49-51 mg twice daily and Spironolactone  25 mg daily.  Will arrange for a nurse visit for BP check in 2 weeks. If BP remains above goal, can further titrate Entresto .   Signed, Laymon CHRISTELLA Qua, PA-C

## 2024-05-19 ENCOUNTER — Ambulatory Visit

## 2024-06-12 DIAGNOSIS — F331 Major depressive disorder, recurrent, moderate: Secondary | ICD-10-CM | POA: Diagnosis not present

## 2024-06-12 DIAGNOSIS — E7800 Pure hypercholesterolemia, unspecified: Secondary | ICD-10-CM | POA: Diagnosis not present

## 2024-06-12 DIAGNOSIS — F410 Panic disorder [episodic paroxysmal anxiety] without agoraphobia: Secondary | ICD-10-CM | POA: Diagnosis not present

## 2024-06-12 DIAGNOSIS — I10 Essential (primary) hypertension: Secondary | ICD-10-CM | POA: Diagnosis not present

## 2024-06-12 DIAGNOSIS — Z299 Encounter for prophylactic measures, unspecified: Secondary | ICD-10-CM | POA: Diagnosis not present

## 2024-06-14 ENCOUNTER — Ambulatory Visit: Admitting: Student

## 2024-07-25 ENCOUNTER — Ambulatory Visit

## 2024-07-25 DIAGNOSIS — I447 Left bundle-branch block, unspecified: Secondary | ICD-10-CM

## 2024-07-25 LAB — CUP PACEART REMOTE DEVICE CHECK
Battery Remaining Longevity: 116 mo
Battery Voltage: 3.01 V
Brady Statistic RV Percent Paced: 0 %
Date Time Interrogation Session: 20260126230850
HighPow Impedance: 73 Ohm
Implantable Lead Connection Status: 753985
Implantable Lead Connection Status: 753985
Implantable Lead Connection Status: 753985
Implantable Lead Implant Date: 20250312
Implantable Lead Implant Date: 20250312
Implantable Lead Implant Date: 20250312
Implantable Lead Location: 753859
Implantable Lead Location: 753860
Implantable Lead Location: 753860
Implantable Lead Model: 3830
Implantable Lead Model: 5076
Implantable Pulse Generator Implant Date: 20250312
Lead Channel Impedance Value: 247 Ohm
Lead Channel Impedance Value: 323 Ohm
Lead Channel Impedance Value: 342 Ohm
Lead Channel Impedance Value: 399 Ohm
Lead Channel Impedance Value: 494 Ohm
Lead Channel Impedance Value: 551 Ohm
Lead Channel Pacing Threshold Amplitude: 0.625 V
Lead Channel Pacing Threshold Pulse Width: 0.4 ms
Lead Channel Sensing Intrinsic Amplitude: 30.9 mV
Lead Channel Sensing Intrinsic Amplitude: 4.9 mV
Lead Channel Setting Pacing Amplitude: 1.25 V
Lead Channel Setting Pacing Amplitude: 2.5 V
Lead Channel Setting Pacing Pulse Width: 0.4 ms
Lead Channel Setting Sensing Sensitivity: 0.3 mV
Zone Setting Status: 755011
Zone Setting Status: 755011
Zone Setting Status: 755011

## 2024-07-30 ENCOUNTER — Ambulatory Visit: Payer: Self-pay | Admitting: Student in an Organized Health Care Education/Training Program

## 2024-08-03 NOTE — Progress Notes (Signed)
 Remote ICD Transmission

## 2024-10-24 ENCOUNTER — Encounter

## 2025-01-23 ENCOUNTER — Encounter

## 2025-04-24 ENCOUNTER — Encounter

## 2025-07-24 ENCOUNTER — Encounter
